# Patient Record
Sex: Female | Born: 1950 | Race: White | Hispanic: No | Marital: Married | Smoking: Never smoker
Health system: Southern US, Community
[De-identification: ages and names within clinical notes are randomized; demographics above are authoritative.]

## PROBLEM LIST (undated history)

## (undated) DIAGNOSIS — Z8553 Personal history of malignant neoplasm of renal pelvis: Secondary | ICD-10-CM

## (undated) DIAGNOSIS — Z8679 Personal history of other diseases of the circulatory system: Secondary | ICD-10-CM

## (undated) DIAGNOSIS — Z9221 Personal history of antineoplastic chemotherapy: Secondary | ICD-10-CM

## (undated) DIAGNOSIS — R112 Nausea with vomiting, unspecified: Secondary | ICD-10-CM

## (undated) DIAGNOSIS — E785 Hyperlipidemia, unspecified: Secondary | ICD-10-CM

## (undated) DIAGNOSIS — M1990 Unspecified osteoarthritis, unspecified site: Secondary | ICD-10-CM

## (undated) DIAGNOSIS — Z923 Personal history of irradiation: Secondary | ICD-10-CM

## (undated) DIAGNOSIS — M6530 Trigger finger, unspecified finger: Secondary | ICD-10-CM

## (undated) DIAGNOSIS — Z973 Presence of spectacles and contact lenses: Secondary | ICD-10-CM

## (undated) DIAGNOSIS — C50919 Malignant neoplasm of unspecified site of unspecified female breast: Secondary | ICD-10-CM

## (undated) DIAGNOSIS — D1803 Hemangioma of intra-abdominal structures: Secondary | ICD-10-CM

## (undated) DIAGNOSIS — C679 Malignant neoplasm of bladder, unspecified: Secondary | ICD-10-CM

## (undated) DIAGNOSIS — Z9889 Other specified postprocedural states: Secondary | ICD-10-CM

## (undated) DIAGNOSIS — E119 Type 2 diabetes mellitus without complications: Secondary | ICD-10-CM

## (undated) DIAGNOSIS — I491 Atrial premature depolarization: Secondary | ICD-10-CM

## (undated) DIAGNOSIS — N390 Urinary tract infection, site not specified: Secondary | ICD-10-CM

## (undated) DIAGNOSIS — I10 Essential (primary) hypertension: Secondary | ICD-10-CM

## (undated) HISTORY — DX: Malignant neoplasm of unspecified site of unspecified female breast: C50.919

## (undated) HISTORY — PX: CATARACT EXTRACTION W/ INTRAOCULAR LENS  IMPLANT, BILATERAL: SHX1307

---

## 1978-09-28 HISTORY — PX: TUBAL LIGATION: SHX77

## 1987-09-29 HISTORY — PX: OTHER SURGICAL HISTORY: SHX169

## 1988-09-28 HISTORY — PX: ABDOMINAL HYSTERECTOMY: SHX81

## 1994-09-28 HISTORY — PX: CEREBRAL ANEURYSM REPAIR: SHX164

## 2000-07-16 ENCOUNTER — Encounter: Admission: RE | Admit: 2000-07-16 | Discharge: 2000-07-16 | Payer: Self-pay | Admitting: Obstetrics and Gynecology

## 2000-07-16 ENCOUNTER — Encounter: Payer: Self-pay | Admitting: Obstetrics and Gynecology

## 2001-03-16 ENCOUNTER — Other Ambulatory Visit: Admission: RE | Admit: 2001-03-16 | Discharge: 2001-03-16 | Payer: Self-pay | Admitting: Obstetrics and Gynecology

## 2001-08-02 ENCOUNTER — Encounter: Admission: RE | Admit: 2001-08-02 | Discharge: 2001-08-02 | Payer: Self-pay | Admitting: Obstetrics and Gynecology

## 2001-08-02 ENCOUNTER — Encounter: Payer: Self-pay | Admitting: Obstetrics and Gynecology

## 2001-11-03 ENCOUNTER — Encounter: Payer: Self-pay | Admitting: Internal Medicine

## 2001-11-03 ENCOUNTER — Encounter: Admission: RE | Admit: 2001-11-03 | Discharge: 2001-11-03 | Payer: Self-pay | Admitting: Internal Medicine

## 2001-11-11 ENCOUNTER — Encounter: Payer: Self-pay | Admitting: Internal Medicine

## 2001-11-11 ENCOUNTER — Encounter: Admission: RE | Admit: 2001-11-11 | Discharge: 2001-11-11 | Payer: Self-pay | Admitting: Internal Medicine

## 2002-08-18 ENCOUNTER — Encounter: Admission: RE | Admit: 2002-08-18 | Discharge: 2002-08-18 | Payer: Self-pay | Admitting: Internal Medicine

## 2002-08-18 ENCOUNTER — Encounter: Payer: Self-pay | Admitting: Internal Medicine

## 2003-09-04 ENCOUNTER — Encounter: Admission: RE | Admit: 2003-09-04 | Discharge: 2003-09-04 | Payer: Self-pay | Admitting: Internal Medicine

## 2004-09-11 ENCOUNTER — Ambulatory Visit (HOSPITAL_COMMUNITY): Admission: RE | Admit: 2004-09-11 | Discharge: 2004-09-11 | Payer: Self-pay | Admitting: Internal Medicine

## 2005-09-17 ENCOUNTER — Ambulatory Visit (HOSPITAL_COMMUNITY): Admission: RE | Admit: 2005-09-17 | Discharge: 2005-09-17 | Payer: Self-pay | Admitting: Internal Medicine

## 2005-10-12 ENCOUNTER — Encounter: Admission: RE | Admit: 2005-10-12 | Discharge: 2005-10-12 | Payer: Self-pay | Admitting: Internal Medicine

## 2005-10-14 ENCOUNTER — Encounter: Admission: RE | Admit: 2005-10-14 | Discharge: 2005-10-14 | Payer: Self-pay | Admitting: Internal Medicine

## 2006-12-09 ENCOUNTER — Ambulatory Visit (HOSPITAL_COMMUNITY): Admission: RE | Admit: 2006-12-09 | Discharge: 2006-12-09 | Payer: Self-pay | Admitting: Internal Medicine

## 2007-12-12 ENCOUNTER — Ambulatory Visit (HOSPITAL_COMMUNITY): Admission: RE | Admit: 2007-12-12 | Discharge: 2007-12-12 | Payer: Self-pay | Admitting: Internal Medicine

## 2008-09-28 HISTORY — PX: CHOLECYSTECTOMY: SHX55

## 2008-12-14 ENCOUNTER — Ambulatory Visit (HOSPITAL_COMMUNITY): Admission: RE | Admit: 2008-12-14 | Discharge: 2008-12-14 | Payer: Self-pay | Admitting: Internal Medicine

## 2009-12-17 ENCOUNTER — Ambulatory Visit (HOSPITAL_COMMUNITY): Admission: RE | Admit: 2009-12-17 | Discharge: 2009-12-17 | Payer: Self-pay | Admitting: Internal Medicine

## 2010-10-19 ENCOUNTER — Encounter: Payer: Self-pay | Admitting: Internal Medicine

## 2010-11-21 ENCOUNTER — Other Ambulatory Visit (HOSPITAL_COMMUNITY): Payer: Self-pay | Admitting: Internal Medicine

## 2010-11-21 DIAGNOSIS — Z1231 Encounter for screening mammogram for malignant neoplasm of breast: Secondary | ICD-10-CM

## 2010-12-23 ENCOUNTER — Ambulatory Visit (HOSPITAL_COMMUNITY)
Admission: RE | Admit: 2010-12-23 | Discharge: 2010-12-23 | Disposition: A | Discharge status: Home / Self Care | Payer: BC Managed Care – PPO | Source: Ambulatory Visit | Attending: Internal Medicine | Admitting: Internal Medicine

## 2010-12-23 DIAGNOSIS — Z1231 Encounter for screening mammogram for malignant neoplasm of breast: Secondary | ICD-10-CM | POA: Insufficient documentation

## 2011-11-13 ENCOUNTER — Other Ambulatory Visit (HOSPITAL_COMMUNITY): Payer: Self-pay | Admitting: Internal Medicine

## 2011-11-13 DIAGNOSIS — Z1231 Encounter for screening mammogram for malignant neoplasm of breast: Secondary | ICD-10-CM

## 2011-12-25 ENCOUNTER — Ambulatory Visit (HOSPITAL_COMMUNITY)
Admission: RE | Admit: 2011-12-25 | Discharge: 2011-12-25 | Disposition: A | Discharge status: Home / Self Care | Payer: BC Managed Care – PPO | Source: Ambulatory Visit | Attending: Internal Medicine | Admitting: Internal Medicine

## 2011-12-25 DIAGNOSIS — Z1231 Encounter for screening mammogram for malignant neoplasm of breast: Secondary | ICD-10-CM | POA: Insufficient documentation

## 2012-11-22 LAB — LIPID PANEL
CHOLESTEROL: 137 (ref 0–200)
HDL: 53 (ref 35–70)
LDL Cholesterol: 66
Triglycerides: 92 (ref 40–160)

## 2012-11-22 LAB — HEMOGLOBIN A1C: HEMOGLOBIN A1C: 6.2

## 2012-12-06 ENCOUNTER — Other Ambulatory Visit (HOSPITAL_COMMUNITY): Payer: Self-pay | Admitting: Internal Medicine

## 2012-12-06 DIAGNOSIS — Z1231 Encounter for screening mammogram for malignant neoplasm of breast: Secondary | ICD-10-CM

## 2012-12-29 ENCOUNTER — Ambulatory Visit (HOSPITAL_COMMUNITY)
Admission: RE | Admit: 2012-12-29 | Discharge: 2012-12-29 | Disposition: A | Discharge status: Home / Self Care | Payer: BC Managed Care – PPO | Source: Ambulatory Visit | Attending: Internal Medicine | Admitting: Internal Medicine

## 2012-12-29 DIAGNOSIS — Z1231 Encounter for screening mammogram for malignant neoplasm of breast: Secondary | ICD-10-CM | POA: Insufficient documentation

## 2013-04-29 ENCOUNTER — Emergency Department (HOSPITAL_BASED_OUTPATIENT_CLINIC_OR_DEPARTMENT_OTHER)
Admission: EM | Admit: 2013-04-29 | Discharge: 2013-04-29 | Disposition: A | Discharge status: Home / Self Care | Payer: BC Managed Care – PPO | Attending: Emergency Medicine | Admitting: Emergency Medicine

## 2013-04-29 ENCOUNTER — Encounter (HOSPITAL_BASED_OUTPATIENT_CLINIC_OR_DEPARTMENT_OTHER): Payer: Self-pay | Admitting: *Deleted

## 2013-04-29 DIAGNOSIS — N390 Urinary tract infection, site not specified: Secondary | ICD-10-CM

## 2013-04-29 DIAGNOSIS — Z79899 Other long term (current) drug therapy: Secondary | ICD-10-CM | POA: Insufficient documentation

## 2013-04-29 DIAGNOSIS — E1169 Type 2 diabetes mellitus with other specified complication: Secondary | ICD-10-CM | POA: Insufficient documentation

## 2013-04-29 DIAGNOSIS — R1110 Vomiting, unspecified: Secondary | ICD-10-CM | POA: Insufficient documentation

## 2013-04-29 DIAGNOSIS — R109 Unspecified abdominal pain: Secondary | ICD-10-CM

## 2013-04-29 DIAGNOSIS — R739 Hyperglycemia, unspecified: Secondary | ICD-10-CM

## 2013-04-29 DIAGNOSIS — Z880 Allergy status to penicillin: Secondary | ICD-10-CM | POA: Insufficient documentation

## 2013-04-29 LAB — CBC WITH DIFFERENTIAL/PLATELET
Basophils Absolute: 0 10*3/uL (ref 0.0–0.1)
Hemoglobin: 13.4 g/dL (ref 12.0–15.0)
Lymphocytes Relative: 9 % — ABNORMAL LOW (ref 12–46)
MCH: 30.9 pg (ref 26.0–34.0)
MCV: 91.2 fL (ref 78.0–100.0)
Monocytes Absolute: 1.2 10*3/uL — ABNORMAL HIGH (ref 0.1–1.0)
Neutrophils Relative %: 81 % — ABNORMAL HIGH (ref 43–77)
Platelets: 226 10*3/uL (ref 150–400)

## 2013-04-29 LAB — COMPREHENSIVE METABOLIC PANEL
AST: 20 U/L (ref 0–37)
Alkaline Phosphatase: 44 U/L (ref 39–117)
Chloride: 98 mEq/L (ref 96–112)
Creatinine, Ser: 1 mg/dL (ref 0.50–1.10)
GFR calc Af Amer: 67 mL/min — ABNORMAL LOW (ref >90)
Glucose, Bld: 231 mg/dL — ABNORMAL HIGH (ref 70–99)
Sodium: 133 mEq/L — ABNORMAL LOW (ref 135–145)

## 2013-04-29 LAB — URINALYSIS, ROUTINE W REFLEX MICROSCOPIC
Bilirubin Urine: NEGATIVE
Glucose, UA: 500 mg/dL — AB
Protein, ur: NEGATIVE mg/dL
Specific Gravity, Urine: 1.023 (ref 1.005–1.030)
pH: 5.5 (ref 5.0–8.0)

## 2013-04-29 LAB — URINE MICROSCOPIC-ADD ON

## 2013-04-29 MED ORDER — CIPROFLOXACIN HCL 500 MG PO TABS: 500.0000 mg | ORAL_TABLET | Freq: Two times a day (BID) | ORAL | Status: DC

## 2013-04-29 NOTE — ED Notes (Signed)
Patient states that she went out to eat on Thursday and afterward had gas like pains in her abd, took some tums and got relief. She states that she is still having some pain except lower. Patient is belching in triage.

## 2013-04-29 NOTE — ED Provider Notes (Signed)
CSN: [id number]     Arrival date & time 04/29/13  1357 History     First MD Initiated Contact with Patient 04/29/13 1408     Chief Complaint  Patient presents with  . Abdominal Pain    Patient is a 62 y.o. female presenting with abdominal pain. The history is provided by the patient and a significant other.  Abdominal Pain This is a new problem. The current episode started 2 days ago. The problem occurs daily. The problem has been gradually improving. Associated symptoms include abdominal pain. Pertinent negatives include no chest pain and no shortness of breath. Nothing aggravates the symptoms. Nothing relieves the symptoms. Treatments tried: prilosec. The treatment provided no relief.  pt reports she at a [place] two nights ago She started to have pain and vomited immediately after dinner Since then, she has had intermittent upper abdominal pain.  No cp/sob No further vomiting.  No diarrhea.  No constipation. No melena or bloody stool.  No back pain. No dysuria She does not use NSAIDs chronically She reports pain is improving.  She is able to eat (she had steak before coming to the ED) Her son told her to go to the ER to have her "heart checked"  Past Medical History  Diagnosis Date  . Diabetes mellitus without complication    Past Surgical History  Procedure Laterality Date  . Cholecystectomy    . Cerebral aneurysm repair     No family history on file. History  Substance Use Topics  . Smoking status: Never Smoker   . Smokeless tobacco: Not on file  . Alcohol Use: No   OB History   Grav Para Term Preterm Abortions TAB SAB Ect Mult Living                 Review of Systems  Constitutional: Negative for fever.  Respiratory: Negative for chest tightness and shortness of breath.   Cardiovascular: Negative for chest pain.  Gastrointestinal: Positive for vomiting and abdominal pain. Negative for diarrhea, constipation, blood in stool and abdominal distention.   Neurological: Negative for dizziness and weakness.  All other systems reviewed and are negative.    Allergies  Dilantin; Penicillins; and Sulfa antibiotics  Home Medications   Current Outpatient Rx  Name  Route  Sig  Dispense  Refill  . fenofibrate (TRICOR) 145 MG tablet   Oral   Take 145 mg by mouth daily.         . metFORMIN (GLUCOPHAGE) 1000 MG tablet   Oral   Take 1,000 mg by mouth 2 (two) times daily with a meal.         . phenylephrine (MYDFRIN) 2.5 % ophthalmic solution      1 drop once.         . pioglitazone (ACTOS) 15 MG tablet   Oral   Take 15 mg by mouth daily.         . pravastatin (PRAVACHOL) 10 MG tablet   Oral   Take 10 mg by mouth daily.         . quinapril (ACCUPRIL) 10 MG tablet   Oral   Take 10 mg by mouth at bedtime.          BP 137/58  Pulse 100  Temp(Src) 99.2 F (37.3 C) (Oral)  Resp 18  SpO2 99% Physical Exam CONSTITUTIONAL: Well developed/well nourished HEAD: Normocephalic/atraumatic EYES: EOMI/PERRL, no icterus ENMT: Mucous membranes moist NECK: supple no meningeal signs SPINE:entire spine nontender CV: S1/S2 noted,  no murmurs/rubs/gallops noted LUNGS: Lungs are clear to auscultation bilaterally, no apparent distress ABDOMEN: soft, mild tenderness to LUQ, no rebound or guarding, +BS GU:no cva tenderness NEURO: Pt is awake/alert, moves all extremitiesx4 EXTREMITIES: pulses normal, full ROM SKIN: warm, color normal, no discoloration to abdomen PSYCH: no abnormalities of mood noted  ED Course   Procedures   Labs Reviewed  CBC WITH DIFFERENTIAL  COMPREHENSIVE METABOLIC PANEL  LIPASE, BLOOD  URINALYSIS, ROUTINE W REFLEX MICROSCOPIC   2:33 PM Pt very well appearing.  She does not want any meds at this time.  I do not feel imaging is indicated.  Will check labs/ekg Pt agreeable 3:38 PM Pt stable, no new complaints, well appearing, using her phone Will treat uti Also - mild elevation in lipase, could be from  mild pancreatitis.  Possible this is med related (tricor) but pt is in no distress, advised hydration, liquid diet and to cut back on heavy/greasy foods for the next several days Pt stable for d/c home  MDM  Nursing notes including past medical history and social history reviewed and considered in documentation Labs/vital reviewed and considered    Date: 04/29/2013 1434  Rate: 93  Rhythm: normal sinus rhythm  QRS Axis: normal  Intervals: normal  ST/T Wave abnormalities: normal  Conduction Disutrbances:none      Sharyon Cable, MD 04/29/13 1539

## 2013-05-01 LAB — URINE CULTURE: Colony Count: 9000

## 2013-12-04 ENCOUNTER — Other Ambulatory Visit (HOSPITAL_COMMUNITY): Payer: Self-pay | Admitting: Internal Medicine

## 2013-12-04 DIAGNOSIS — Z1231 Encounter for screening mammogram for malignant neoplasm of breast: Secondary | ICD-10-CM

## 2014-01-08 ENCOUNTER — Ambulatory Visit (HOSPITAL_COMMUNITY)
Admission: RE | Admit: 2014-01-08 | Discharge: 2014-01-08 | Disposition: A | Discharge status: Home / Self Care | Payer: BC Managed Care – PPO | Source: Ambulatory Visit | Attending: Internal Medicine | Admitting: Internal Medicine

## 2014-01-08 DIAGNOSIS — Z1231 Encounter for screening mammogram for malignant neoplasm of breast: Secondary | ICD-10-CM | POA: Insufficient documentation

## 2014-06-15 LAB — BASIC METABOLIC PANEL
BUN: 24 — AB (ref 4–21)
CREATININE: 1.3 — AB (ref 0.5–1.1)

## 2014-06-15 LAB — TSH: TSH: 3.12 (ref 0.41–5.90)

## 2014-12-19 ENCOUNTER — Other Ambulatory Visit (HOSPITAL_COMMUNITY): Payer: Self-pay | Admitting: Internal Medicine

## 2014-12-19 DIAGNOSIS — Z1231 Encounter for screening mammogram for malignant neoplasm of breast: Secondary | ICD-10-CM

## 2015-01-14 ENCOUNTER — Ambulatory Visit (HOSPITAL_COMMUNITY)
Admission: RE | Admit: 2015-01-14 | Discharge: 2015-01-14 | Disposition: A | Discharge status: Home / Self Care | Payer: Medicare Other | Source: Ambulatory Visit | Attending: Internal Medicine | Admitting: Internal Medicine

## 2015-01-14 DIAGNOSIS — Z1231 Encounter for screening mammogram for malignant neoplasm of breast: Secondary | ICD-10-CM | POA: Insufficient documentation

## 2015-12-09 ENCOUNTER — Other Ambulatory Visit: Payer: Self-pay

## 2015-12-09 DIAGNOSIS — Z1231 Encounter for screening mammogram for malignant neoplasm of breast: Secondary | ICD-10-CM

## 2015-12-20 LAB — BASIC METABOLIC PANEL
POTASSIUM: 4.5 (ref 3.4–5.3)
Sodium: 136 — AB (ref 137–147)

## 2015-12-20 LAB — LIPID PANEL
Cholesterol: 122 (ref 0–200)
HDL: 48 (ref 35–70)
LDL Cholesterol: 59
Triglycerides: 75 (ref 40–160)

## 2015-12-20 LAB — HEPATIC FUNCTION PANEL
ALT: 26 (ref 7–35)
AST: 28 (ref 13–35)
Alkaline Phosphatase: 41 (ref 25–125)

## 2015-12-20 LAB — CBC AND DIFFERENTIAL
HEMATOCRIT: 40 (ref 36–46)
HEMOGLOBIN: 13.6 (ref 12.0–16.0)
PLATELETS: 254 (ref 150–399)
WBC: 5.8

## 2015-12-20 LAB — HEMOGLOBIN A1C: HEMOGLOBIN A1C: 6.4

## 2016-01-15 ENCOUNTER — Ambulatory Visit
Admission: RE | Admit: 2016-01-15 | Discharge: 2016-01-15 | Disposition: A | Discharge status: Home / Self Care | Payer: Medicare Other | Source: Ambulatory Visit

## 2016-01-15 DIAGNOSIS — Z1231 Encounter for screening mammogram for malignant neoplasm of breast: Secondary | ICD-10-CM

## 2016-08-16 ENCOUNTER — Emergency Department (HOSPITAL_BASED_OUTPATIENT_CLINIC_OR_DEPARTMENT_OTHER)
Admission: EM | Admit: 2016-08-16 | Discharge: 2016-08-16 | Disposition: A | Discharge status: Home / Self Care | Payer: Medicare Other | Attending: Emergency Medicine | Admitting: Emergency Medicine

## 2016-08-16 ENCOUNTER — Encounter (HOSPITAL_BASED_OUTPATIENT_CLINIC_OR_DEPARTMENT_OTHER): Payer: Self-pay | Admitting: *Deleted

## 2016-08-16 DIAGNOSIS — N12 Tubulo-interstitial nephritis, not specified as acute or chronic: Secondary | ICD-10-CM | POA: Diagnosis not present

## 2016-08-16 DIAGNOSIS — R109 Unspecified abdominal pain: Secondary | ICD-10-CM | POA: Diagnosis present

## 2016-08-16 DIAGNOSIS — Z79899 Other long term (current) drug therapy: Secondary | ICD-10-CM | POA: Diagnosis not present

## 2016-08-16 DIAGNOSIS — E119 Type 2 diabetes mellitus without complications: Secondary | ICD-10-CM | POA: Diagnosis not present

## 2016-08-16 DIAGNOSIS — Z7984 Long term (current) use of oral hypoglycemic drugs: Secondary | ICD-10-CM | POA: Diagnosis not present

## 2016-08-16 DIAGNOSIS — I10 Essential (primary) hypertension: Secondary | ICD-10-CM | POA: Insufficient documentation

## 2016-08-16 LAB — URINE MICROSCOPIC-ADD ON

## 2016-08-16 LAB — URINALYSIS, ROUTINE W REFLEX MICROSCOPIC
Bilirubin Urine: NEGATIVE
Glucose, UA: NEGATIVE mg/dL
Ketones, ur: NEGATIVE mg/dL
NITRITE: NEGATIVE
PH: 6 (ref 5.0–8.0)
Protein, ur: 100 mg/dL — AB
SPECIFIC GRAVITY, URINE: 1.014 (ref 1.005–1.030)

## 2016-08-16 MED ORDER — SODIUM CHLORIDE 0.9 % IV BOLUS (SEPSIS)
1000.0000 mL | Freq: Once | INTRAVENOUS | Status: AC
  Administered 2016-08-16: 1000 mL via INTRAVENOUS

## 2016-08-16 MED ORDER — CEFTRIAXONE SODIUM 1 G IJ SOLR
1.0000 g | Freq: Once | INTRAMUSCULAR | Status: AC
  Administered 2016-08-16: 1 g via INTRAVENOUS
  Filled 2016-08-16: qty 10

## 2016-08-16 MED ORDER — CEPHALEXIN 500 MG PO CAPS: 500.0000 mg | ORAL_CAPSULE | Freq: Three times a day (TID) | ORAL | 0 refills | Status: DC

## 2016-08-16 MED ORDER — FENTANYL CITRATE (PF) 100 MCG/2ML IJ SOLN
50.0000 ug | Freq: Once | INTRAMUSCULAR | Status: AC
  Administered 2016-08-16: 50 ug via INTRAVENOUS
  Filled 2016-08-16: qty 2

## 2016-08-16 MED ORDER — ONDANSETRON HCL 4 MG/2ML IJ SOLN
4.0000 mg | Freq: Once | INTRAMUSCULAR | Status: AC
  Administered 2016-08-16: 4 mg via INTRAVENOUS
  Filled 2016-08-16: qty 2

## 2016-08-16 NOTE — ED Provider Notes (Signed)
[place] DE[facility] Provider Note: Georgena Spurling, MD, FACEP  CSN: [id number] MRN: [id number] ARRIVAL: 08/16/16 at [place]: [place]  Flank Pain   HISTORY OF PRESENT ILLNESS  Tasha Tucker is a 65 y.o. female who developed hematuria about 3 weeks ago. She was seen at [facility] urology and had a CT scan which was originally read as negative; It was over read by radiologist who saw a lesion of the left kidney suspicious for cancer. She was also diagnosed with urinary tract infection and was treated with Macrobid; she completed the course yesterday.   She had not been having significant pain until several hours ago. She awoke with severe pain in her left flank, worse with movement or palpation. She noticed urinary pressure and discomfort with urination earlier which has improved. When she gave a urine specimen here there was gross hematuria with small clots present. She denies fever or chills. She has had nausea.   Past Medical History:  Diagnosis Date  . Diabetes mellitus without complication [facility])     Past Surgical History:  Procedure Laterality Date  . CEREBRAL ANEURYSM REPAIR    . CHOLECYSTECTOMY      History reviewed. No pertinent family history.  Social History  Substance Use Topics  . Smoking status: Never Smoker  . Smokeless tobacco: Never Used  . Alcohol use No    Prior to Admission medications   Medication Sig Start Date End Date Taking? Authorizing Provider  ciprofloxacin (CIPRO) 500 MG tablet Take 1 tablet (500 mg total) by mouth 2 (two) times daily. One po bid x 7 days 04/29/13   Ripley Fraise, MD  fenofibrate (TRICOR) 145 MG tablet Take 145 mg by mouth daily.    Historical Provider, MD  metFORMIN (GLUCOPHAGE) 1000 MG tablet Take 1,000 mg by mouth 2 (two) times daily with a meal.    Historical Provider, MD  phenylephrine (MYDFRIN) 2.5 % ophthalmic solution 1 drop once.    Historical Provider, MD  pioglitazone (ACTOS) 15 MG tablet Take  15 mg by mouth daily.    Historical Provider, MD  pravastatin (PRAVACHOL) 10 MG tablet Take 10 mg by mouth daily.    Historical Provider, MD  quinapril (ACCUPRIL) 10 MG tablet Take 10 mg by mouth at bedtime.    Historical Provider, MD    Allergies Dilantin [phenytoin]; Penicillins; and Sulfa antibiotics   REVIEW OF SYSTEMS  Negative except as noted here or in the History of Present Illness.   PHYSICAL EXAMINATION  Initial Vital Signs Blood pressure 152/65, pulse 80, temperature 98.5 F (36.9 C), temperature source Oral, resp. rate 18, height 5\' 7"  (1.702 m), weight 230 lb (104.3 kg), SpO2 100 %.  Examination General: Well-developed, well-nourished female in no acute distress; appearance consistent with age of record HENT: normocephalic; atraumatic Eyes: pupils equal, round and reactive to light; extraocular muscles intact Neck: supple Heart: regular rate and rhythm Lungs: clear to auscultation bilaterally Abdomen: soft; nondistended; suprapubic tenderness; no masses or hepatosplenomegaly; bowel sounds present GU: Left CVA tenderness Extremities: No deformity; full range of motion; pulses normal Neurologic: Awake, alert and oriented; motor function intact in all extremities and symmetric; no facial droop Skin: Warm and dry Psychiatric: Normal mood and affect   RESULTS  Summary of this visit's results, reviewed by myself:   EKG Interpretation  Date/Time:    Ventricular Rate:    PR Interval:    QRS Duration:   QT Interval:    QTC Calculation:  R Axis:     Text Interpretation:        Laboratory Studies: Results for orders placed or performed during the hospital encounter of 08/16/16 (from the past 24 hour(s))  Urinalysis, Routine w reflex microscopic (not at [facility])     Status: Abnormal   Collection Time: 08/16/16  4:50 AM  Result Value Ref Range   Color, Urine RED (A) YELLOW   APPearance CLOUDY (A) CLEAR   Specific Gravity, Urine 1.014 1.005 - 1.030   pH 6.0 5.0 -  8.0   Glucose, UA NEGATIVE NEGATIVE mg/dL   Hgb urine dipstick LARGE (A) NEGATIVE   Bilirubin Urine NEGATIVE NEGATIVE   Ketones, ur NEGATIVE NEGATIVE mg/dL   Protein, ur 100 (A) NEGATIVE mg/dL   Nitrite NEGATIVE NEGATIVE   Leukocytes, UA MODERATE (A) NEGATIVE  Urine microscopic-add on     Status: Abnormal   Collection Time: 08/16/16  4:50 AM  Result Value Ref Range   Squamous Epithelial / LPF 6-30 (A) NONE SEEN   WBC, UA TOO NUMEROUS TO COUNT 0 - 5 WBC/hpf   RBC / HPF TOO NUMEROUS TO COUNT 0 - 5 RBC/hpf   Bacteria, UA MANY (A) NONE SEEN   Imaging Studies: No results found.  ED COURSE  Nursing notes and initial vitals signs, including pulse oximetry, reviewed.  Vitals:   08/16/16 0507 08/16/16 0508  BP: 152/65   Pulse: 80   Resp: 18   Temp: 98.5 F (36.9 C)   TempSrc: Oral   SpO2: 100%   Weight:  230 lb (104.3 kg)  Height:  5\' 7"  (1.702 m)   7:11 AM Patient's pain and nausea well controlled at this time. She was given Rocephin one gram for pyelonephritis. She has an appointment with [facility], Dr. Tresa Moore, tomorrow afternoon.   PROCEDURES    ED DIAGNOSES     ICD-9-CM ICD-10-CM   1. Pyelonephritis 590.80 N12        Shanon Rosser, MD 08/16/16 [phone]

## 2016-08-16 NOTE — ED Triage Notes (Addendum)
Here with husband from home, here for L flank pain, also nausea, pressure/dysuria, change in stream, and hematuria, reports recent visits to [facility], recent CT scan, possible lesion on kidney, recent UTI, finished abx yesterday (macrobid), "feel better now than earlier", "was worse lying down in bed trying to sleep PTA", also reports pressure and scant stream earlier, "seems to have resolved, obvious blood in urine sample at triage.

## 2016-08-16 NOTE — ED Notes (Signed)
EDP into room 

## 2016-08-16 NOTE — ED Notes (Signed)
Up to b/r with steady gait, no changes, denies pain or nausea, husband remains at [facility]. Updated.

## 2016-08-17 LAB — URINE CULTURE

## 2016-08-18 ENCOUNTER — Other Ambulatory Visit: Payer: Self-pay | Admitting: Urology

## 2016-08-18 ENCOUNTER — Encounter (HOSPITAL_BASED_OUTPATIENT_CLINIC_OR_DEPARTMENT_OTHER): Payer: Self-pay | Admitting: *Deleted

## 2016-08-18 NOTE — Progress Notes (Signed)
To [facility] at [facility]- Istat 8, Ekg on arrival. Instructed Npo solids after Mn-clear liquids only(No dairy,pulp)untill 0700, then Npo .

## 2016-08-26 ENCOUNTER — Ambulatory Visit (HOSPITAL_COMMUNITY): Payer: Medicare Other

## 2016-08-26 ENCOUNTER — Encounter (HOSPITAL_BASED_OUTPATIENT_CLINIC_OR_DEPARTMENT_OTHER)
Admission: RE | Disposition: A | Discharge status: Home / Self Care | Payer: Self-pay | Source: Ambulatory Visit | Attending: Urology

## 2016-08-26 ENCOUNTER — Other Ambulatory Visit: Payer: Self-pay

## 2016-08-26 ENCOUNTER — Ambulatory Visit (HOSPITAL_BASED_OUTPATIENT_CLINIC_OR_DEPARTMENT_OTHER): Payer: Medicare Other | Admitting: Anesthesiology

## 2016-08-26 ENCOUNTER — Ambulatory Visit (HOSPITAL_BASED_OUTPATIENT_CLINIC_OR_DEPARTMENT_OTHER)
Admission: RE | Admit: 2016-08-26 | Discharge: 2016-08-26 | Disposition: A | Discharge status: Home / Self Care | Payer: Medicare Other | Source: Ambulatory Visit | Attending: Urology | Admitting: Urology

## 2016-08-26 ENCOUNTER — Encounter (HOSPITAL_BASED_OUTPATIENT_CLINIC_OR_DEPARTMENT_OTHER): Payer: Self-pay | Admitting: *Deleted

## 2016-08-26 DIAGNOSIS — C677 Malignant neoplasm of urachus: Secondary | ICD-10-CM | POA: Diagnosis not present

## 2016-08-26 DIAGNOSIS — I10 Essential (primary) hypertension: Secondary | ICD-10-CM | POA: Insufficient documentation

## 2016-08-26 DIAGNOSIS — Z880 Allergy status to penicillin: Secondary | ICD-10-CM | POA: Insufficient documentation

## 2016-08-26 DIAGNOSIS — E669 Obesity, unspecified: Secondary | ICD-10-CM | POA: Insufficient documentation

## 2016-08-26 DIAGNOSIS — E119 Type 2 diabetes mellitus without complications: Secondary | ICD-10-CM | POA: Diagnosis not present

## 2016-08-26 DIAGNOSIS — Z79899 Other long term (current) drug therapy: Secondary | ICD-10-CM | POA: Diagnosis not present

## 2016-08-26 DIAGNOSIS — Z6834 Body mass index (BMI) 34.0-34.9, adult: Secondary | ICD-10-CM | POA: Diagnosis not present

## 2016-08-26 DIAGNOSIS — Z7984 Long term (current) use of oral hypoglycemic drugs: Secondary | ICD-10-CM | POA: Insufficient documentation

## 2016-08-26 DIAGNOSIS — R52 Pain, unspecified: Secondary | ICD-10-CM

## 2016-08-26 HISTORY — PX: CYSTOSCOPY WITH RETROGRADE PYELOGRAM, URETEROSCOPY AND STENT PLACEMENT: SHX5789

## 2016-08-26 HISTORY — DX: Essential (primary) hypertension: I10

## 2016-08-26 HISTORY — DX: Nausea with vomiting, unspecified: R11.2

## 2016-08-26 HISTORY — DX: Other specified postprocedural states: Z98.890

## 2016-08-26 LAB — POCT I-STAT, CHEM 8
BUN: 27 mg/dL — AB (ref 6–20)
CALCIUM ION: 1.35 mmol/L (ref 1.15–1.40)
CREATININE: 1.5 mg/dL — AB (ref 0.44–1.00)
Chloride: 103 mmol/L (ref 101–111)
GLUCOSE: 110 mg/dL — AB (ref 65–99)
HEMATOCRIT: 47 % — AB (ref 36.0–46.0)
Hemoglobin: 16 g/dL — ABNORMAL HIGH (ref 12.0–15.0)
Potassium: 4.6 mmol/L (ref 3.5–5.1)
Sodium: 141 mmol/L (ref 135–145)
TCO2: 25 mmol/L (ref 0–100)

## 2016-08-26 LAB — GLUCOSE, CAPILLARY: Glucose-Capillary: 131 mg/dL — ABNORMAL HIGH (ref 65–99)

## 2016-08-26 SURGERY — CYSTOURETEROSCOPY, WITH RETROGRADE PYELOGRAM AND STENT INSERTION
Anesthesia: General | Site: Renal | Laterality: Left

## 2016-08-26 MED ORDER — MIDAZOLAM HCL 5 MG/5ML IJ SOLN
INTRAMUSCULAR | Status: DC | PRN
  Administered 2016-08-26: 2 mg via INTRAVENOUS

## 2016-08-26 MED ORDER — DIPHENHYDRAMINE HCL 25 MG PO CAPS
ORAL_CAPSULE | ORAL | Status: AC
  Filled 2016-08-26: qty 1

## 2016-08-26 MED ORDER — ONDANSETRON HCL 4 MG/2ML IJ SOLN
INTRAMUSCULAR | Status: AC
  Filled 2016-08-26: qty 2

## 2016-08-26 MED ORDER — OXYCODONE-ACETAMINOPHEN 5-325 MG PO TABS: 1.0000 | ORAL_TABLET | Freq: Four times a day (QID) | ORAL | 0 refills | Status: DC | PRN

## 2016-08-26 MED ORDER — DEXAMETHASONE SODIUM PHOSPHATE 4 MG/ML IJ SOLN
INTRAMUSCULAR | Status: DC | PRN
  Administered 2016-08-26: 10 mg via INTRAVENOUS

## 2016-08-26 MED ORDER — GENTAMICIN IN SALINE 1.6-0.9 MG/ML-% IV SOLN
80.0000 mg | INTRAVENOUS | Status: DC
  Filled 2016-08-26: qty 50

## 2016-08-26 MED ORDER — PROPOFOL 10 MG/ML IV BOLUS
INTRAVENOUS | Status: DC | PRN
  Administered 2016-08-26: 200 mg via INTRAVENOUS

## 2016-08-26 MED ORDER — DIPHENHYDRAMINE HCL 50 MG/ML IJ SOLN
INTRAMUSCULAR | Status: AC
  Filled 2016-08-26: qty 1

## 2016-08-26 MED ORDER — SCOPOLAMINE 1 MG/3DAYS TD PT72
MEDICATED_PATCH | TRANSDERMAL | Status: AC
  Filled 2016-08-26: qty 1

## 2016-08-26 MED ORDER — LACTATED RINGERS IV SOLN
INTRAVENOUS | Status: DC | PRN
  Administered 2016-08-26: 14:00:00 via INTRAVENOUS

## 2016-08-26 MED ORDER — SODIUM CHLORIDE 0.9 % IR SOLN
Status: DC | PRN
  Administered 2016-08-26: 4000 mL

## 2016-08-26 MED ORDER — FENTANYL CITRATE (PF) 100 MCG/2ML IJ SOLN
INTRAMUSCULAR | Status: DC | PRN
  Administered 2016-08-26 (×2): 50 ug via INTRAVENOUS

## 2016-08-26 MED ORDER — DEXAMETHASONE SODIUM PHOSPHATE 10 MG/ML IJ SOLN
INTRAMUSCULAR | Status: AC
  Filled 2016-08-26: qty 1

## 2016-08-26 MED ORDER — LIDOCAINE 2% (20 MG/ML) 5 ML SYRINGE
INTRAMUSCULAR | Status: AC
  Filled 2016-08-26: qty 5

## 2016-08-26 MED ORDER — IOHEXOL 300 MG/ML  SOLN
INTRAMUSCULAR | Status: DC | PRN
  Administered 2016-08-26: 18 mL

## 2016-08-26 MED ORDER — FENTANYL CITRATE (PF) 100 MCG/2ML IJ SOLN
INTRAMUSCULAR | Status: AC
  Filled 2016-08-26: qty 2

## 2016-08-26 MED ORDER — GENTAMICIN SULFATE 40 MG/ML IJ SOLN
5.0000 mg/kg | Freq: Once | INTRAVENOUS | Status: AC
  Administered 2016-08-26: 400 mg via INTRAVENOUS
  Filled 2016-08-26: qty 10

## 2016-08-26 MED ORDER — SCOPOLAMINE 1 MG/3DAYS TD PT72
1.0000 | MEDICATED_PATCH | TRANSDERMAL | Status: DC
  Administered 2016-08-26: 1.5 mg via TRANSDERMAL
  Filled 2016-08-26: qty 1

## 2016-08-26 MED ORDER — SODIUM CHLORIDE 0.9 % IV SOLN
INTRAVENOUS | Status: DC
Start: 2016-08-26 — End: 2016-08-26
  Administered 2016-08-26: 13:00:00 via INTRAVENOUS
  Filled 2016-08-26: qty 1000

## 2016-08-26 MED ORDER — DIPHENHYDRAMINE HCL 50 MG/ML IJ SOLN
12.5000 mg | Freq: Once | INTRAMUSCULAR | Status: AC
  Administered 2016-08-26: 12.5 mg via INTRAVENOUS
  Filled 2016-08-26: qty 0.25

## 2016-08-26 MED ORDER — LACTATED RINGERS IV SOLN
INTRAVENOUS | Status: DC
  Filled 2016-08-26: qty 1000

## 2016-08-26 MED ORDER — ONDANSETRON HCL 4 MG/2ML IJ SOLN
INTRAMUSCULAR | Status: DC | PRN
  Administered 2016-08-26: 4 mg via INTRAVENOUS

## 2016-08-26 MED ORDER — SENNOSIDES-DOCUSATE SODIUM 8.6-50 MG PO TABS: 1.0000 | ORAL_TABLET | Freq: Two times a day (BID) | ORAL | 0 refills | Status: DC

## 2016-08-26 MED ORDER — LIDOCAINE 2% (20 MG/ML) 5 ML SYRINGE
INTRAMUSCULAR | Status: DC | PRN
  Administered 2016-08-26: 60 mg via INTRAVENOUS

## 2016-08-26 MED ORDER — PROPOFOL 10 MG/ML IV BOLUS
INTRAVENOUS | Status: AC
  Filled 2016-08-26: qty 40

## 2016-08-26 MED ORDER — MIDAZOLAM HCL 2 MG/2ML IJ SOLN
INTRAMUSCULAR | Status: AC
  Filled 2016-08-26: qty 2

## 2016-08-26 SURGICAL SUPPLY — 31 items
BAG DRAIN URO-CYSTO SKYTR STRL (DRAIN) ×3 IMPLANT
BAG DRN UROCATH (DRAIN) ×1
BASKET DAKOTA 1.9FR 11X120 (BASKET) IMPLANT
BASKET LASER NITINOL 1.9FR (BASKET) IMPLANT
BASKET ZERO TIP NITINOL 2.4FR (BASKET) IMPLANT
BSKT STON RTRVL 120 1.9FR (BASKET)
BSKT STON RTRVL ZERO TP 2.4FR (BASKET)
CATH INTERMIT  6FR 70CM (CATHETERS) ×3 IMPLANT
CLOTH BEACON ORANGE TIMEOUT ST (SAFETY) ×3 IMPLANT
FIBER LASER FLEXIVA 365 (UROLOGICAL SUPPLIES) IMPLANT
FIBER LASER TRAC TIP (UROLOGICAL SUPPLIES) IMPLANT
GLOVE BIO SURGEON STRL SZ7.5 (GLOVE) ×3 IMPLANT
GLOVE EUDERMIC 7 POWDERFREE (GLOVE) ×2 IMPLANT
GLOVE INDICATOR 7.0 STRL GRN (GLOVE) ×2 IMPLANT
GOWN STRL REUS W/ TWL LRG LVL3 (GOWN DISPOSABLE) ×2 IMPLANT
GOWN STRL REUS W/TWL LRG LVL3 (GOWN DISPOSABLE) ×6
GOWN STRL REUS W/TWL XL LVL3 (GOWN DISPOSABLE) ×2 IMPLANT
GUIDEWIRE ANG ZIPWIRE 038X150 (WIRE) ×3 IMPLANT
GUIDEWIRE STR DUAL SENSOR (WIRE) ×3 IMPLANT
IV NS 1000ML (IV SOLUTION) ×3
IV NS 1000ML BAXH (IV SOLUTION) ×1 IMPLANT
IV NS IRRIG 3000ML ARTHROMATIC (IV SOLUTION) ×3 IMPLANT
KIT ROOM TURNOVER WOR (KITS) ×3 IMPLANT
MANIFOLD NEPTUNE II (INSTRUMENTS) ×3 IMPLANT
NS IRRIG 500ML POUR BTL (IV SOLUTION) ×3 IMPLANT
PACK CYSTO (CUSTOM PROCEDURE TRAY) ×3 IMPLANT
STENT URET 6FRX24 CONTOUR (STENTS) ×2 IMPLANT
SYRINGE 10CC LL (SYRINGE) ×3 IMPLANT
TUBE CONNECTING 12'X1/4 (SUCTIONS) ×1
TUBE CONNECTING 12X1/4 (SUCTIONS) ×1 IMPLANT
TUBE FEEDING 8FR 16IN STR KANG (MISCELLANEOUS) ×3 IMPLANT

## 2016-08-26 NOTE — Discharge Instructions (Signed)
°  Post Anesthesia Home Care Instructions ° °Activity: °Get plenty of rest for the remainder of the day. A responsible adult should stay with you for 24 hours following the procedure.  °For the next 24 hours, DO NOT: °-Drive a car °-Operate machinery °-Drink alcoholic beverages °-Take any medication unless instructed by your physician °-Make any legal decisions or sign important papers. ° °Meals: °Start with liquid foods such as gelatin or soup. Progress to regular foods as tolerated. Avoid greasy, spicy, heavy foods. If nausea and/or vomiting occur, drink only clear liquids until the nausea and/or vomiting subsides. Call your physician if vomiting continues. ° °Special Instructions/Symptoms: °Your throat may feel dry or sore from the anesthesia or the breathing tube placed in your throat during surgery. If this causes discomfort, gargle with warm salt water. The discomfort should disappear within 24 hours. ° °If you had a scopolamine patch placed behind your ear for the management of post- operative nausea and/or vomiting: ° °1. The medication in the patch is effective for 72 hours, after which it should be removed.  Wrap patch in a tissue and discard in the trash. Wash hands thoroughly with soap and water. °2. You may remove the patch earlier than 72 hours if you experience unpleasant side effects which may include dry mouth, dizziness or visual disturbances. °3. Avoid touching the patch. Wash your hands with soap and water after contact with the patch. °  °1 - You may have urinary urgency (bladder spasms) and bloody urine on / off with stent in place. This is normal. ° °2 - Call MD or go to ER for fever >102, severe pain / nausea / vomiting not relieved by medications, or acute change in medical status ° °

## 2016-08-26 NOTE — Anesthesia Preprocedure Evaluation (Addendum)
Anesthesia Evaluation  Patient identified by MRN, date of birth, ID band Patient awake    Reviewed: Allergy & Precautions, NPO status , Patient's Chart, lab work & pertinent test results  History of Anesthesia Complications (+) PONV and history of anesthetic complications  Airway Mallampati: II  TM Distance: >3 FB Neck ROM: Full    Dental  (+) Teeth Intact, Dental Advisory Given, Chipped,    Pulmonary neg pulmonary ROS,    breath sounds clear to auscultation       Cardiovascular Exercise Tolerance: Good hypertension, Pt. on medications  Rhythm:Regular Rate:Normal     Neuro/Psych negative neurological ROS  negative psych ROS   GI/Hepatic negative GI ROS, Neg liver ROS,   Endo/Other  diabetes, Type 2, Oral Hypoglycemic Agents  Renal/GU   negative genitourinary   Musculoskeletal negative musculoskeletal ROS (+)   Abdominal   Peds negative pediatric ROS (+)  Hematology negative hematology ROS (+)   Anesthesia Other Findings   Reproductive/Obstetrics negative OB ROS                            Anesthesia Physical Anesthesia Plan  ASA: II  Anesthesia Plan: General   Post-op Pain Management:    Induction: Intravenous  Airway Management Planned: LMA  Additional Equipment:   Intra-op Plan:   Post-operative Plan: Extubation in OR  Informed Consent: I have reviewed the patients History and Physical, chart, labs and discussed the procedure including the risks, benefits and alternatives for the proposed anesthesia with the patient or authorized representative who has indicated his/her understanding and acceptance.   Dental advisory given  Plan Discussed with: CRNA  Anesthesia Plan Comments: (Need EKG)        Anesthesia Quick Evaluation

## 2016-08-26 NOTE — Transfer of Care (Signed)
Immediate Anesthesia Transfer of Care Note  Patient: Tasha Tucker  Procedure(s) Performed: Procedure(s): CYSTOSCOPY WITH BILATERAL RETROGRADE PYELOGRAM,LEFT URETEROSCOPY WITH BIOPSY AND LEFT STENT PLACEMENT (Left)  Patient Location: PACU  Anesthesia Type:General  Level of Consciousness: awake, alert , oriented and patient cooperative  Airway & Oxygen Therapy: Patient Spontanous Breathing and Patient connected to nasal cannula oxygen  Post-op Assessment: Report given to RN and Post -op Vital signs reviewed and stable  Post vital signs: Reviewed and stable  Last Vitals:  Vitals:   08/26/16 1222  BP: (!) 151/62  Pulse: (!) 106  Resp: 16  Temp: 37.6 C    Last Pain:  Vitals:   08/26/16 1222  TempSrc: Oral      Patients Stated Pain Goal: 7 (39/35/94 0905)  Complications: No apparent anesthesia complications

## 2016-08-26 NOTE — H&P (Signed)
Tasha Tucker is an 65 y.o. female.    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 2 arter (lower accessory) 1 vein (small lumbar noted) left renovascular anatomy.   2 - Gross Hematuria - new gross hematuria 2017. CT with left renal pelvis mass as per above.   3 - Cystitis / Bacteruria - MDR enterococcus on UCX 2017, sens macrobid.   PMH sig for obesity, lap hyst (benign), BTL, lap chole, DM2 (no neuropathy). Her PCP is Tasha Colder MD.   Today "Tasha Tucker" is seen to proceed with ureteroscopy for diagnostic and staging purposes. NO interval fevers. She has been on macrobid pre-op as per most recent CX data.    Past Medical History:  Diagnosis Date  . Diabetes mellitus without complication (HCC)    Type 2  . Hematuria   . Hypertension   . PONV (postoperative nausea and vomiting)     Past Surgical History:  Procedure Laterality Date  . ABDOMINAL HYSTERECTOMY  1990  . CEREBRAL ANEURYSM REPAIR  1996   clipped-no deficits  . CHOLECYSTECTOMY  2010  . EYE SURGERY Bilateral 2017 ,2016   cataract w/lens  . TUBAL LIGATION  1980    History reviewed. No pertinent family history. Social History:  reports that she has never smoked. She has never used smokeless tobacco. She reports that she does not drink alcohol. Her drug history is not on file.  Allergies:  Allergies  Allergen Reactions  . Dilantin [Phenytoin] Rash  . Penicillins Rash  . Sulfa Antibiotics Rash    No prescriptions prior to admission.    No results found for this or any previous visit (from the past 48 hour(s)). No results found.  Review of Systems  Constitutional: Negative.  Negative for chills and fever.  HENT: Negative.   Eyes: Negative.   Respiratory: Negative.   Cardiovascular: Negative.    Gastrointestinal: Negative.   Genitourinary: Negative.   Musculoskeletal: Negative.   Skin: Negative.   Neurological: Negative.   Endo/Heme/Allergies: Negative.   Psychiatric/Behavioral: Negative.     Height 5\' 8"  (1.727 m), weight 104.3 kg (230 lb). Physical Exam  Constitutional: She appears well-developed.  HENT:  Head: Normocephalic.  Eyes: Pupils are equal, round, and reactive to light.  Neck: Normal range of motion.  Cardiovascular: Normal rate.   Respiratory: Effort normal.  GI: Soft.  Genitourinary:  Genitourinary Comments: No CVAT at present  Musculoskeletal: Normal range of motion.  Neurological: She is alert.  Skin: Skin is warm.  Psychiatric: She has a normal mood and affect.     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 Rec proceed with operativ cysto, bilat retrogrades left ureteroscopy / biopsy / stent today. If in fact cancer, she will likely need eventual nephroureterectomy   Risks, benefits, alternaitives, expected peri-op course discussed previously and reiterated today.   Alexis Frock, MD 08/26/2016, 12:05 PM

## 2016-08-26 NOTE — Anesthesia Procedure Notes (Signed)
Procedure Name: LMA Insertion Date/Time: 08/26/2016 2:27 PM Performed by: Wanita Chamberlain Pre-anesthesia Checklist: Patient identified, Timeout performed, Suction available, Emergency Drugs available and Patient being monitored Patient Re-evaluated:Patient Re-evaluated prior to inductionOxygen Delivery Method: Circle system utilized Preoxygenation: Pre-oxygenation with 100% oxygen Intubation Type: IV induction Ventilation: Mask ventilation without difficulty LMA: LMA inserted LMA Size: 4.0 Number of attempts: 1 Placement Confirmation: positive ETCO2 and breath sounds checked- equal and bilateral Tube secured with: Tape Dental Injury: Teeth and Oropharynx as per pre-operative assessment

## 2016-08-26 NOTE — Brief Op Note (Signed)
08/26/2016  2:59 PM  PATIENT:  Tasha Tucker  65 y.o. female  PRE-OPERATIVE DIAGNOSIS:  LEFT RENAL PELVIS MASS, HEMATURIA  POST-OPERATIVE DIAGNOSIS:  LEFT RENAL PELVIS MASS, HEMATURIA  PROCEDURE:  Procedure(s): CYSTOSCOPY WITH BILATERAL RETROGRADE PYELOGRAM,LEFT URETEROSCOPY WITH BIOPSY AND LEFT STENT PLACEMENT (Left)  SURGEON:  Surgeon(s) and Role:    * Alexis Frock, MD - Primary  PHYSICIAN ASSISTANT:   ASSISTANTS: none   ANESTHESIA:   general  EBL:  Total I/O In: 500 [I.V.:500] Out: -   BLOOD ADMINISTERED:none  DRAINS: none   LOCAL MEDICATIONS USED:  NONE  SPECIMEN:  Source of Specimen:  Left renal pelvis tumor fragments  DISPOSITION OF SPECIMEN:  PATHOLOGY  COUNTS:  YES  TOURNIQUET:  * No tourniquets in log *  DICTATION: .Other Dictation: Dictation Number [id number]  PLAN OF CARE: Discharge to home after PACU  PATIENT DISPOSITION:  PACU - hemodynamically stable.   Delay start of Pharmacological VTE agent (>24hrs) due to surgical blood loss or risk of bleeding: yes

## 2016-08-26 NOTE — Anesthesia Postprocedure Evaluation (Signed)
Anesthesia Post Note  Patient: Tasha Tucker  Procedure(s) Performed: Procedure(s) (LRB): CYSTOSCOPY WITH BILATERAL RETROGRADE PYELOGRAM,LEFT URETEROSCOPY WITH BIOPSY AND LEFT STENT PLACEMENT (Left)  Patient location during evaluation: PACU Anesthesia Type: General Level of consciousness: awake and alert and patient cooperative Pain management: pain level controlled Vital Signs Assessment: post-procedure vital signs reviewed and stable Respiratory status: spontaneous breathing and respiratory function stable Cardiovascular status: stable Anesthetic complications: no    Last Vitals:  Vitals:   08/26/16 1545 08/26/16 1613  BP: (!) 154/63 (!) 144/58  Pulse: 84 74  Resp: 13 14  Temp:  36.7 C    Last Pain:  Vitals:   08/26/16 1613  TempSrc: Tympanic  PainSc: 0-No pain                 Harmonee Tozer S

## 2016-08-27 ENCOUNTER — Encounter (HOSPITAL_BASED_OUTPATIENT_CLINIC_OR_DEPARTMENT_OTHER): Payer: Self-pay | Admitting: Urology

## 2016-08-28 NOTE — Op Note (Signed)
NAME:  Tasha Tucker, Tasha Tucker               ACCOUNT NO.:  [id number]  MEDICAL RECORD NO.:  [id number]  LOCATION:                                 FACILITY:  PHYSICIAN:  Alexis Frock, MD     DATE OF BIRTH:  July 23, 1951  DATE OF PROCEDURE: 08/26/2016                              OPERATIVE REPORT   DIAGNOSIS:  Left renal pelvis mass, gross hematuria.  PROCEDURES: 1. Cystoscopy with bilateral retrograde pyelograms with     interpretation. 2. Left ureteroscopy with biopsy of tumor. 3. Insertion of left ureteral stent, 6 x 24, Contour, no tether.  ESTIMATED BLOOD LOSS:  Nil.  COMPLICATION:  None.  SPECIMEN:  Left renal pelvis tumor fragments for permanent pathology.  FINDINGS: 1. Unremarkable urinary bladder. 2. Unremarkable right retrograde pyelogram. 3. Large filling defect in left renal pelvis obliterating the upper     pole of kidney.  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 4. Successful placement of left ureteral stent, proximal in renal     pelvis and distal in urinary bladder.  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  Options were discussed for management, initial management including recommendation of left ureteroscopic biopsy for diagnostic and staging purposes and she wished to proceed.  Informed consent was obtained and placed in the medical record.  PROCEDURE IN DETAIL:  The patient being Tasha Tucker, was verified. Procedure being cysto, bilateral retrogrades and left ureteroscopy with biopsy was confirmed.  Procedure was carried out.  Time-out was performed.  Intravenous antibiotics were administered.  General anesthesia was introduced.  The patient was placed into a low lithotomy position and sterile field was created by  prepping and draping the patient's vagina, introitus, and proximal thighs using iodine.  Next, cystourethroscopy was performed using a rigid cystoscope with offset lens.  Inspection of the urinary bladder revealed no diverticula, calcifications, or papillary lesions.  The right ureteral orifice was cannulated with a 6-French end-hole catheter and right retrograde pyelogram was obtained.  Right retrograde pyelogram demonstrated a single right ureter with single-system right kidney.  No filling defects or narrowing noted. Similarly, left retrograde pyelogram was obtained.  Left retrograde pyelogram demonstrated a single left ureter with single- system left kidney.  There was a large filling defect in the mid and upper pole of left kidney completely obliterating the intrarenal collecting system above the lower third of the kidney on the left.  A 0.038 Zip wire was advanced to the level of the renal pelvis, set aside as a safety wire.  An 8-French feeding tube was placed in the urinary bladder for pressure release.  Next, semi-rigid ureteroscopy was performed from the entire length of left ureter alongside a separate Sensor working wire.  No mucosal abnormalities were found.  The semi- rigid scope was exchanged for a 12/14, 24-cm ureteral access sheath at the level of proximal ureter using fluoroscopic guidance.  Next, flexible digital ureteroscopy was performed of the proximal left ureter and  systematic inspection of the right kidney, including all lower pole calices and renal pelvis.  This revealed a very large papillary tumor occupying the vast majority of the left renal pelvis and mid and upper pole calices.  There were multiple dystrophic calcifications.  There was a very large interface between this and the lateral urothelium.  It appeared to be much too large for endoscopic management.  An Escape basket was then used to snare representative areas of the neoplasm, which were then  removed ureteroscopically and set aside for pathologic evaluation.  The access sheath was removed under continuous vision.  No mucosal abnormalities were found, and a new 6 x 24 Contour-type stent was placed over the remaining safety wire using fluoroscopic guidance. Good proximal and distal deployment were noted.  Procedure was then terminated.  The patient tolerated the procedure well.  There were no immediate periprocedural complications.  The patient was taken to the postanesthesia care unit in stable condition.    ______________________________ Alexis Frock, MD   ______________________________ Alexis Frock, MD    TM/MEDQ  D:  08/26/2016  T:  08/27/2016  Job:  [id number]

## 2016-08-31 ENCOUNTER — Other Ambulatory Visit: Payer: Self-pay | Admitting: Urology

## 2016-10-02 NOTE — Progress Notes (Signed)
EKG 08-26-16 EPIC

## 2016-10-02 NOTE — Patient Instructions (Addendum)
Tasha Tucker  10/02/2016   Your procedure is scheduled on: 10-09-16  Report to [facility] Main  Entrance take [facility]  elevators to 3rd floor to  [place] at 515 AM.  Call this number if you have problems the morning of surgery [phone]   Remember: ONLY 1 PERSON MAY GO WITH YOU TO SHORT STAY TO GET  READY MORNING OF [place].  Do not eat food  :After Midnight Wednesday NIGHT, CLEAR LIQUIDS ALL DAY THURSDAY 10-08-16 PER DR MANNY. NO CLEAR LQIUIDS AFTER MIDNIGHT Thursday NIGHT. FOLLOW ALL BOWEL PREP INSTRUCTIONS FROM DR [facility].      Take these medicines the morning of surgery with A SIP OF WATER: ZYRTEC DO NOT TAKE ANY DIABETIC MEDICATIONS DAY OF YOUR SURGERY                               You may not have any metal on your body including hair pins and              piercings  Do not wear jewelry, make-up, lotions, powders or perfumes, deodorant             Do not wear nail polish.  Do not shave  48 hours prior to surgery.              Men may shave face and neck.   Do not bring valuables to the hospital. [place].  Contacts, dentures or bridgework may not be worn into surgery.  Leave suitcase in the car. After surgery it may be brought to your room.                 Please read over the following fact sheets you were given: _____________________________________________________________________                CLEAR LIQUID DIET   Foods Allowed                                                                     Foods Excluded  Coffee and tea, regular and decaf                             liquids that you cannot  Plain Jell-O in any flavor                                             see through such as: Fruit ices (not with fruit pulp)                                     milk, soups, orange juice  Iced Popsicles  All solid food Carbonated beverages, regular and  diet                                    Cranberry, grape and apple juices Sports drinks like Gatorade Lightly seasoned clear broth or consume(fat free) Sugar, honey syrup  Sample Menu Breakfast                                Lunch                                     Supper Cranberry juice                    Beef broth                            Chicken broth Jell-O                                     Grape juice                           Apple juice Coffee or tea                        Jell-O                                      Popsicle                                                Coffee or tea                        Coffee or tea  _____________________________________________________________________ How to Manage Your Diabetes Before and After Surgery  Why is it important to control my blood sugar before and after surgery? . Improving blood sugar levels before and after surgery helps healing and can limit problems. . A way of improving blood sugar control is eating a healthy diet by: o  Eating less sugar and carbohydrates o  Increasing activity/exercise o  Talking with your doctor about reaching your blood sugar goals . High blood sugars (greater than 180 mg/dL) can raise your risk of infections and slow your recovery, so you will need to focus on controlling your diabetes during the weeks before surgery. . Make sure that the doctor who takes care of your diabetes knows about your planned surgery including the date and location.  How do I manage my blood sugar before surgery? . Check your blood sugar at least 4 times a day, starting 2 days before surgery, to make sure that the level is not too high or low. o Check your blood sugar the morning of your surgery when you wake up and every 2 hours until you get to the Short Stay unit. . If your blood sugar is less than  70 mg/dL, you will need to treat for low blood sugar: o Do not take insulin. o Treat a low blood sugar (less than 70  mg/dL) with  cup of clear juice (cranberry or apple), 4 glucose tablets, OR glucose gel. o Recheck blood sugar in 15 minutes after treatment (to make sure it is greater than 70 mg/dL). If your blood sugar is not greater than 70 mg/dL on recheck, call [phone] for further instructions. . Report your blood sugar to the short stay nurse when you get to Short Stay.  . If you are admitted to the hospital after surgery: o Your blood sugar will be checked by the staff and you will probably be given insulin after surgery (instead of oral diabetes medicines) to make sure you have good blood sugar levels. o The goal for blood sugar control after surgery is 80-180 mg/dL.   WHAT DO I DO ABOUT MY DIABETES MEDICATION? YOU MAY TAKE YOUR METFORMIAN AS USUAL THE DAY BEFORE SURGERY ON 10-08-16.  . Do not take oral diabetes medicines (pills) the morning of surgery.  Patient Signature:  Date:   Nurse Signature:  Date:   Reviewed and Endorsed by [facility] Patient Education Committee, August 2015 [facility] - Preparing for Surgery Before surgery, you can play an important role.  Because skin is not sterile, your skin needs to be as free of germs as possible.  You can reduce the number of germs on your skin by washing with BAR OF GOAL DIAL SOAP soap before surgery.  CHG is an antiseptic cleaner which kills germs and bonds with the skin to continue killing germs even after washing. Please DO NOT use if you have an allergy to CHG or antibacterial soaps.  If your skin becomes reddened/irritated stop using the CHG and inform your nurse when you arrive at Short Stay. Do not shave (including legs and underarms) for at least 48 hours prior to the first CHG shower.  You may shave your face/neck. Please follow these instructions carefully:  1.  Shower with  BAR OF GOLD DIAL SOAP the night before surgery and the  morning of Surgery.  2.  If you choose to wash your hair, wash your hair first as usual with your  normal   shampoo.  3.  After you shampoo, rinse your hair and body thoroughly to remove the  shampoo.                                              Gently with a scrungie or clean washcloth.                 9.  Pat yourself dry with a clean towel.            10.  Wear clean pajamas.            11.  Place clean sheets on your bed the night of your first shower and do not  sleep with pets. Day of Surgery : Do not apply any lotions/deodorants the morning of surgery.  Please wear clean clothes to the hospital/surgery center.  FAILURE TO FOLLOW THESE INSTRUCTIONS MAY RESULT IN THE CANCELLATION OF YOUR SURGERY PATIENT SIGNATURE_________________________________  NURSE SIGNATURE__________________________________  ________________________________________________________________________  WHAT IS A BLOOD TRANSFUSION? Blood Transfusion Information  A transfusion is the replacement of blood or some of its parts. Blood is made  up of multiple cells which provide different functions.  Red blood cells carry oxygen and are used for blood loss replacement.  White blood cells fight against infection.  Platelets control bleeding.  Plasma helps clot blood.  Other blood products are available for specialized needs, such as hemophilia or other clotting disorders. BEFORE THE TRANSFUSION  Who gives blood for transfusions?   Healthy volunteers who are fully evaluated to make sure their blood is safe. This is blood bank blood. Transfusion therapy is the safest it has ever been in the practice of medicine. Before blood is taken from a donor, a complete history is taken to make sure that person has no history of diseases nor engages in risky social behavior (examples are intravenous drug use or sexual activity with multiple partners). The donor's travel history is screened to minimize risk of transmitting infections, such as malaria. The donated blood is tested for signs of infectious diseases, such as HIV and hepatitis.  The blood is then tested to be sure it is compatible with you in order to minimize the chance of a transfusion reaction. If you or a relative donates blood, this is often done in anticipation of surgery and is not appropriate for emergency situations. It takes many days to process the donated blood. RISKS AND COMPLICATIONS Although transfusion therapy is very safe and saves many lives, the main dangers of transfusion include:   Getting an infectious disease.  Developing a transfusion reaction. This is an allergic reaction to something in the blood you were given. Every precaution is taken to prevent this. The decision to have a blood transfusion has been considered carefully by your caregiver before blood is given. Blood is not given unless the benefits outweigh the risks. AFTER THE TRANSFUSION  Right after receiving a blood transfusion, you will usually feel much better and more energetic. This is especially true if your red blood cells have gotten low (anemic). The transfusion raises the level of the red blood cells which carry oxygen, and this usually causes an energy increase.  The nurse administering the transfusion will monitor you carefully for complications. HOME CARE INSTRUCTIONS  No special instructions are needed after a transfusion. You may find your energy is better. Speak with your caregiver about any limitations on activity for underlying diseases you may have. SEEK MEDICAL CARE IF:   Your condition is not improving after your transfusion.  You develop redness or irritation at the intravenous (IV) site. SEEK IMMEDIATE MEDICAL CARE IF:  Any of the following symptoms occur over the next 12 hours:  Shaking chills.  You have a temperature by mouth above 102 F (38.9 C), not controlled by medicine.  Chest, back, or muscle pain.  People around you feel you are not acting correctly or are confused.  Shortness of breath or difficulty breathing.  Dizziness and fainting.  You  get a rash or develop hives.  You have a decrease in urine output.  Your urine turns a dark color or changes to pink, red, or brown. Any of the following symptoms occur over the next 10 days:  You have a temperature by mouth above 102 F (38.9 C), not controlled by medicine.  Shortness of breath.  Weakness after normal activity.  The white part of the eye turns yellow (jaundice).  You have a decrease in the amount of urine or are urinating less often.  Your urine turns a dark color or changes to pink, red, or brown. Document Released: 09/11/2000 Document Revised: 12/07/2011 Document  Reviewed: 04/30/2008 ExitCare Patient Information 2014 [place], [place].  _______________________________________________________________________

## 2016-10-06 ENCOUNTER — Encounter (HOSPITAL_COMMUNITY)
Admission: RE | Admit: 2016-10-06 | Discharge: 2016-10-06 | Disposition: A | Discharge status: Home / Self Care | Payer: Medicare Other | Source: Ambulatory Visit | Attending: Urology | Admitting: Urology

## 2016-10-06 ENCOUNTER — Encounter (HOSPITAL_COMMUNITY): Payer: Self-pay

## 2016-10-06 HISTORY — DX: Unspecified osteoarthritis, unspecified site: M19.90

## 2016-10-06 LAB — COMPREHENSIVE METABOLIC PANEL
ALK PHOS: 36 U/L — AB (ref 38–126)
ALT: 25 U/L (ref 14–54)
AST: 25 U/L (ref 15–41)
Albumin: 3.7 g/dL (ref 3.5–5.0)
Anion gap: 7 (ref 5–15)
BILIRUBIN TOTAL: 0.5 mg/dL (ref 0.3–1.2)
BUN: 31 mg/dL — AB (ref 6–20)
CALCIUM: 9.7 mg/dL (ref 8.9–10.3)
CO2: 24 mmol/L (ref 22–32)
CREATININE: 1.42 mg/dL — AB (ref 0.44–1.00)
Chloride: 109 mmol/L (ref 101–111)
GFR calc Af Amer: 43 mL/min — ABNORMAL LOW (ref >60)
GFR, EST NON AFRICAN AMERICAN: 37 mL/min — AB (ref >60)
Glucose, Bld: 190 mg/dL — ABNORMAL HIGH (ref 65–99)
POTASSIUM: 4.6 mmol/L (ref 3.5–5.1)
Sodium: 140 mmol/L (ref 135–145)
TOTAL PROTEIN: 6.9 g/dL (ref 6.5–8.1)

## 2016-10-06 LAB — CBC
HEMATOCRIT: 39.3 % (ref 36.0–46.0)
Hemoglobin: 13.3 g/dL (ref 12.0–15.0)
MCH: 30.2 pg (ref 26.0–34.0)
MCHC: 33.8 g/dL (ref 30.0–36.0)
MCV: 89.3 fL (ref 78.0–100.0)
PLATELETS: 250 10*3/uL (ref 150–400)
RBC: 4.4 MIL/uL (ref 3.87–5.11)
RDW: 13.6 % (ref 11.5–15.5)
WBC: 8.2 10*3/uL (ref 4.0–10.5)

## 2016-10-06 LAB — ABO/RH: ABO/RH(D): A POS

## 2016-10-06 LAB — GLUCOSE, CAPILLARY: Glucose-Capillary: 209 mg/dL — ABNORMAL HIGH (ref 65–99)

## 2016-10-06 NOTE — Progress Notes (Signed)
CMET routed to Dr Alexis Frock  Via EPIC

## 2016-10-07 LAB — HEMOGLOBIN A1C
Hgb A1c MFr Bld: 6.4 % — ABNORMAL HIGH (ref 4.8–5.6)
Mean Plasma Glucose: 137 mg/dL

## 2016-10-08 ENCOUNTER — Encounter (HOSPITAL_COMMUNITY): Payer: Self-pay | Admitting: Anesthesiology

## 2016-10-08 MED ORDER — GENTAMICIN SULFATE 40 MG/ML IJ SOLN
5.0000 mg/kg | INTRAVENOUS | Status: AC
  Administered 2016-10-09: 400 mg via INTRAVENOUS
  Filled 2016-10-08: qty 10

## 2016-10-08 NOTE — Anesthesia Preprocedure Evaluation (Addendum)
Anesthesia Evaluation  Patient identified by MRN, date of birth, ID band Patient awake    Reviewed: Allergy & Precautions, NPO status , Patient's Chart, lab work & pertinent test results  History of Anesthesia Complications (+) PONV and history of anesthetic complications  Airway Mallampati: II  TM Distance: >3 FB Neck ROM: Limited    Dental  (+) Teeth Intact, Dental Advisory Given   Pulmonary pneumonia,    breath sounds clear to auscultation       Cardiovascular hypertension, Pt. on medications  Rhythm:Regular Rate:Normal     Neuro/Psych negative neurological ROS  negative psych ROS   GI/Hepatic negative GI ROS, Neg liver ROS,   Endo/Other  diabetes, Type 2, Oral Hypoglycemic Agents  Renal/GU   negative genitourinary   Musculoskeletal  (+) Arthritis , Osteoarthritis,    Abdominal   Peds negative pediatric ROS (+)  Hematology negative hematology ROS (+)   Anesthesia Other Findings   Reproductive/Obstetrics negative OB ROS                            Lab Results  Component Value Date   WBC 8.2 10/06/2016   HGB 13.3 10/06/2016   HCT 39.3 10/06/2016   MCV 89.3 10/06/2016   PLT 250 10/06/2016   Lab Results  Component Value Date   CREATININE 1.42 (H) 10/06/2016   BUN 31 (H) 10/06/2016   NA 140 10/06/2016   K 4.6 10/06/2016   CL 109 10/06/2016   CO2 24 10/06/2016   No results found for: INR, PROTIME  07/2016 EKG: normal sinus rhythm, occasional PAC's.   Anesthesia Physical Anesthesia Plan  ASA: II  Anesthesia Plan: General   Post-op Pain Management:    Induction: Intravenous  Airway Management Planned: Oral ETT  Additional Equipment:   Intra-op Plan:   Post-operative Plan: Extubation in OR  Informed Consent: I have reviewed the patients History and Physical, chart, labs and discussed the procedure including the risks, benefits and alternatives for the proposed  anesthesia with the patient or authorized representative who has indicated his/her understanding and acceptance.   Dental advisory given  Plan Discussed with: CRNA  Anesthesia Plan Comments:         Anesthesia Quick Evaluation

## 2016-10-09 ENCOUNTER — Inpatient Hospital Stay (HOSPITAL_COMMUNITY)
Admission: RE | Admit: 2016-10-09 | Discharge: 2016-10-11 | DRG: 658 | Disposition: A | Discharge status: Home / Self Care | Payer: Medicare Other | Source: Ambulatory Visit | Attending: Urology | Admitting: Urology

## 2016-10-09 ENCOUNTER — Inpatient Hospital Stay (HOSPITAL_COMMUNITY): Payer: Medicare Other | Admitting: Anesthesiology

## 2016-10-09 ENCOUNTER — Encounter (HOSPITAL_COMMUNITY)
Admission: RE | Disposition: A | Discharge status: Home / Self Care | Payer: Self-pay | Source: Ambulatory Visit | Attending: Urology

## 2016-10-09 ENCOUNTER — Encounter (HOSPITAL_COMMUNITY): Payer: Self-pay | Admitting: *Deleted

## 2016-10-09 DIAGNOSIS — I10 Essential (primary) hypertension: Secondary | ICD-10-CM | POA: Diagnosis not present

## 2016-10-09 DIAGNOSIS — Z7984 Long term (current) use of oral hypoglycemic drugs: Secondary | ICD-10-CM | POA: Diagnosis not present

## 2016-10-09 DIAGNOSIS — E7800 Pure hypercholesterolemia, unspecified: Secondary | ICD-10-CM | POA: Diagnosis not present

## 2016-10-09 DIAGNOSIS — Z882 Allergy status to sulfonamides status: Secondary | ICD-10-CM | POA: Diagnosis not present

## 2016-10-09 DIAGNOSIS — C652 Malignant neoplasm of left renal pelvis: Secondary | ICD-10-CM | POA: Diagnosis present

## 2016-10-09 DIAGNOSIS — Z91048 Other nonmedicinal substance allergy status: Secondary | ICD-10-CM | POA: Diagnosis not present

## 2016-10-09 DIAGNOSIS — M1990 Unspecified osteoarthritis, unspecified site: Secondary | ICD-10-CM | POA: Diagnosis present

## 2016-10-09 DIAGNOSIS — Z888 Allergy status to other drugs, medicaments and biological substances status: Secondary | ICD-10-CM | POA: Diagnosis not present

## 2016-10-09 DIAGNOSIS — Z9049 Acquired absence of other specified parts of digestive tract: Secondary | ICD-10-CM | POA: Diagnosis not present

## 2016-10-09 DIAGNOSIS — E119 Type 2 diabetes mellitus without complications: Secondary | ICD-10-CM | POA: Diagnosis present

## 2016-10-09 DIAGNOSIS — Z880 Allergy status to penicillin: Secondary | ICD-10-CM | POA: Diagnosis not present

## 2016-10-09 DIAGNOSIS — N2889 Other specified disorders of kidney and ureter: Secondary | ICD-10-CM | POA: Diagnosis present

## 2016-10-09 DIAGNOSIS — Z791 Long term (current) use of non-steroidal anti-inflammatories (NSAID): Secondary | ICD-10-CM | POA: Diagnosis not present

## 2016-10-09 HISTORY — PX: ROBOT ASSITED LAPAROSCOPIC NEPHROURETERECTOMY: SHX6077

## 2016-10-09 LAB — GLUCOSE, CAPILLARY
GLUCOSE-CAPILLARY: 136 mg/dL — AB (ref 65–99)
Glucose-Capillary: 155 mg/dL — ABNORMAL HIGH (ref 65–99)
Glucose-Capillary: 270 mg/dL — ABNORMAL HIGH (ref 65–99)

## 2016-10-09 LAB — HEMOGLOBIN AND HEMATOCRIT, BLOOD
HEMATOCRIT: 37.9 % (ref 36.0–46.0)
Hemoglobin: 12.7 g/dL (ref 12.0–15.0)

## 2016-10-09 LAB — TYPE AND SCREEN
ABO/RH(D): A POS
ANTIBODY SCREEN: NEGATIVE

## 2016-10-09 SURGERY — NEPHROURETERECTOMY, ROBOT-ASSISTED, LAPAROSCOPIC
Anesthesia: General | Laterality: Left

## 2016-10-09 MED ORDER — EPHEDRINE SULFATE-NACL 50-0.9 MG/10ML-% IV SOSY
PREFILLED_SYRINGE | INTRAVENOUS | Status: DC | PRN
  Administered 2016-10-09: 10 mg via INTRAVENOUS

## 2016-10-09 MED ORDER — LACTATED RINGERS IV SOLN: INTRAVENOUS | Status: DC

## 2016-10-09 MED ORDER — SODIUM CHLORIDE 0.9 % IJ SOLN
INTRAMUSCULAR | Status: DC | PRN
  Administered 2016-10-09: 20 mL

## 2016-10-09 MED ORDER — ONDANSETRON HCL 4 MG/2ML IJ SOLN: 4.0000 mg | INTRAMUSCULAR | Status: DC | PRN

## 2016-10-09 MED ORDER — DIPHENHYDRAMINE HCL 50 MG/ML IJ SOLN: 12.5000 mg | Freq: Four times a day (QID) | INTRAMUSCULAR | Status: DC | PRN

## 2016-10-09 MED ORDER — LIDOCAINE 2% (20 MG/ML) 5 ML SYRINGE
INTRAMUSCULAR | Status: DC | PRN
  Administered 2016-10-09: 100 mg via INTRAVENOUS

## 2016-10-09 MED ORDER — ONDANSETRON HCL 4 MG/2ML IJ SOLN
INTRAMUSCULAR | Status: AC
  Filled 2016-10-09: qty 2

## 2016-10-09 MED ORDER — FENOFIBRATE 160 MG PO TABS
160.0000 mg | ORAL_TABLET | Freq: Every day | ORAL | Status: DC
  Administered 2016-10-10: 160 mg via ORAL
  Filled 2016-10-09 (×2): qty 1

## 2016-10-09 MED ORDER — SODIUM CHLORIDE 0.9 % IJ SOLN
INTRAMUSCULAR | Status: AC
  Filled 2016-10-09: qty 50

## 2016-10-09 MED ORDER — INSULIN ASPART 100 UNIT/ML ~~LOC~~ SOLN
0.0000 [IU] | Freq: Three times a day (TID) | SUBCUTANEOUS | Status: DC
Start: 2016-10-09 — End: 2016-10-11
  Administered 2016-10-09 – 2016-10-10 (×2): 5 [IU] via SUBCUTANEOUS
  Administered 2016-10-10: 3 [IU] via SUBCUTANEOUS
  Administered 2016-10-11: 2 [IU] via SUBCUTANEOUS

## 2016-10-09 MED ORDER — DEXAMETHASONE SODIUM PHOSPHATE 10 MG/ML IJ SOLN
INTRAMUSCULAR | Status: DC | PRN
  Administered 2016-10-09: 10 mg via INTRAVENOUS

## 2016-10-09 MED ORDER — MIDAZOLAM HCL 2 MG/2ML IJ SOLN
INTRAMUSCULAR | Status: AC
  Filled 2016-10-09: qty 2

## 2016-10-09 MED ORDER — PROPOFOL 10 MG/ML IV BOLUS
INTRAVENOUS | Status: DC | PRN
  Administered 2016-10-09: 160 mg via INTRAVENOUS

## 2016-10-09 MED ORDER — MIDAZOLAM HCL 5 MG/5ML IJ SOLN
INTRAMUSCULAR | Status: DC | PRN
  Administered 2016-10-09: 2 mg via INTRAVENOUS

## 2016-10-09 MED ORDER — PROMETHAZINE HCL 25 MG/ML IJ SOLN
INTRAMUSCULAR | Status: AC
  Filled 2016-10-09: qty 1

## 2016-10-09 MED ORDER — DIPHENHYDRAMINE HCL 12.5 MG/5ML PO ELIX: 12.5000 mg | ORAL_SOLUTION | Freq: Four times a day (QID) | ORAL | Status: DC | PRN

## 2016-10-09 MED ORDER — OXYCODONE-ACETAMINOPHEN 5-325 MG PO TABS: 1.0000 | ORAL_TABLET | Freq: Four times a day (QID) | ORAL | 0 refills | Status: DC | PRN

## 2016-10-09 MED ORDER — PROMETHAZINE HCL 25 MG/ML IJ SOLN
6.2500 mg | INTRAMUSCULAR | Status: DC | PRN
  Administered 2016-10-09: 6.25 mg via INTRAVENOUS

## 2016-10-09 MED ORDER — ACETAMINOPHEN 500 MG PO TABS
1000.0000 mg | ORAL_TABLET | Freq: Four times a day (QID) | ORAL | Status: AC
  Administered 2016-10-09 – 2016-10-10 (×3): 1000 mg via ORAL
  Filled 2016-10-09 (×3): qty 2

## 2016-10-09 MED ORDER — DEXAMETHASONE SODIUM PHOSPHATE 10 MG/ML IJ SOLN
INTRAMUSCULAR | Status: AC
  Filled 2016-10-09: qty 1

## 2016-10-09 MED ORDER — LIDOCAINE 2% (20 MG/ML) 5 ML SYRINGE
INTRAMUSCULAR | Status: AC
  Filled 2016-10-09: qty 5

## 2016-10-09 MED ORDER — FENTANYL CITRATE (PF) 100 MCG/2ML IJ SOLN
INTRAMUSCULAR | Status: DC | PRN
  Administered 2016-10-09 (×3): 50 ug via INTRAVENOUS
  Administered 2016-10-09: 100 ug via INTRAVENOUS

## 2016-10-09 MED ORDER — LACTATED RINGERS IV SOLN
INTRAVENOUS | Status: DC | PRN
  Administered 2016-10-09 (×2): via INTRAVENOUS

## 2016-10-09 MED ORDER — HYDROMORPHONE HCL 1 MG/ML IJ SOLN: 0.5000 mg | INTRAMUSCULAR | Status: DC | PRN

## 2016-10-09 MED ORDER — PROPOFOL 10 MG/ML IV BOLUS
INTRAVENOUS | Status: AC
  Filled 2016-10-09: qty 40

## 2016-10-09 MED ORDER — OXYCODONE HCL 5 MG PO TABS
5.0000 mg | ORAL_TABLET | ORAL | Status: DC | PRN
  Administered 2016-10-09: 5 mg via ORAL
  Filled 2016-10-09: qty 1

## 2016-10-09 MED ORDER — SODIUM CHLORIDE 0.45 % IV SOLN
INTRAVENOUS | Status: DC
  Administered 2016-10-09 – 2016-10-11 (×4): via INTRAVENOUS

## 2016-10-09 MED ORDER — BUPIVACAINE LIPOSOME 1.3 % IJ SUSP
INTRAMUSCULAR | Status: AC
  Filled 2016-10-09: qty 20

## 2016-10-09 MED ORDER — HYDROMORPHONE HCL 2 MG/ML IJ SOLN
INTRAMUSCULAR | Status: AC
  Filled 2016-10-09: qty 1

## 2016-10-09 MED ORDER — LACTATED RINGERS IR SOLN
Status: DC | PRN
  Administered 2016-10-09: 1000 mL

## 2016-10-09 MED ORDER — SUGAMMADEX SODIUM 500 MG/5ML IV SOLN
INTRAVENOUS | Status: AC
  Filled 2016-10-09: qty 5

## 2016-10-09 MED ORDER — ONDANSETRON HCL 4 MG/2ML IJ SOLN
INTRAMUSCULAR | Status: DC | PRN
  Administered 2016-10-09: 4 mg via INTRAVENOUS

## 2016-10-09 MED ORDER — MEPERIDINE HCL 50 MG/ML IJ SOLN: 6.2500 mg | INTRAMUSCULAR | Status: DC | PRN

## 2016-10-09 MED ORDER — STERILE WATER FOR IRRIGATION IR SOLN
Status: DC | PRN
  Administered 2016-10-09: 1000 mL

## 2016-10-09 MED ORDER — PRAVASTATIN SODIUM 40 MG PO TABS
40.0000 mg | ORAL_TABLET | Freq: Every evening | ORAL | Status: DC
  Administered 2016-10-09 – 2016-10-10 (×2): 40 mg via ORAL
  Filled 2016-10-09 (×2): qty 1

## 2016-10-09 MED ORDER — EPHEDRINE 5 MG/ML INJ
INTRAVENOUS | Status: AC
  Filled 2016-10-09: qty 10

## 2016-10-09 MED ORDER — ROCURONIUM BROMIDE 50 MG/5ML IV SOSY
PREFILLED_SYRINGE | INTRAVENOUS | Status: AC
  Filled 2016-10-09: qty 10

## 2016-10-09 MED ORDER — NAPHAZOLINE-GLYCERIN 0.012-0.2 % OP SOLN
1.0000 [drp] | Freq: Four times a day (QID) | OPHTHALMIC | Status: DC | PRN
  Filled 2016-10-09: qty 15

## 2016-10-09 MED ORDER — SUGAMMADEX SODIUM 200 MG/2ML IV SOLN
INTRAVENOUS | Status: DC | PRN
  Administered 2016-10-09: 400 mg via INTRAVENOUS

## 2016-10-09 MED ORDER — ROCURONIUM BROMIDE 10 MG/ML (PF) SYRINGE
PREFILLED_SYRINGE | INTRAVENOUS | Status: DC | PRN
  Administered 2016-10-09: 50 mg via INTRAVENOUS
  Administered 2016-10-09: 20 mg via INTRAVENOUS

## 2016-10-09 MED ORDER — HYDROMORPHONE HCL 1 MG/ML IJ SOLN
0.2500 mg | INTRAMUSCULAR | Status: DC | PRN
  Administered 2016-10-09 (×2): 0.5 mg via INTRAVENOUS

## 2016-10-09 MED ORDER — CLINDAMYCIN PHOSPHATE 900 MG/50ML IV SOLN
900.0000 mg | INTRAVENOUS | Status: AC
  Administered 2016-10-09: 900 mg via INTRAVENOUS

## 2016-10-09 MED ORDER — CLINDAMYCIN PHOSPHATE 900 MG/50ML IV SOLN
INTRAVENOUS | Status: AC
  Filled 2016-10-09: qty 50

## 2016-10-09 MED ORDER — FENTANYL CITRATE (PF) 250 MCG/5ML IJ SOLN
INTRAMUSCULAR | Status: AC
  Filled 2016-10-09: qty 5

## 2016-10-09 MED ORDER — BUPIVACAINE LIPOSOME 1.3 % IJ SUSP
INTRAMUSCULAR | Status: DC | PRN
  Administered 2016-10-09: 20 mL

## 2016-10-09 SURGICAL SUPPLY — 64 items
ADH SKN CLS APL DERMABOND .7 (GAUZE/BANDAGES/DRESSINGS) ×2
BAG LAPAROSCOPIC 12 15 PORT 16 (BASKET) ×1 IMPLANT
BAG RETRIEVAL 12/15 (BASKET) ×2
BAG RETRIEVAL 12/15MM (BASKET) ×1
CHLORAPREP W/TINT 26ML (MISCELLANEOUS) ×3 IMPLANT
CLIP LIGATING HEM O LOK PURPLE (MISCELLANEOUS) ×3 IMPLANT
CLIP LIGATING HEMO LOK XL GOLD (MISCELLANEOUS) ×2 IMPLANT
CLIP LIGATING HEMO O LOK GREEN (MISCELLANEOUS) ×4 IMPLANT
COVER SURGICAL LIGHT HANDLE (MISCELLANEOUS) ×3 IMPLANT
COVER TIP SHEARS 8 DVNC (MISCELLANEOUS) ×1 IMPLANT
COVER TIP SHEARS 8MM DA VINCI (MISCELLANEOUS) ×2
DECANTER SPIKE VIAL GLASS SM (MISCELLANEOUS) ×3 IMPLANT
DERMABOND ADVANCED (GAUZE/BANDAGES/DRESSINGS) ×4
DERMABOND ADVANCED .7 DNX12 (GAUZE/BANDAGES/DRESSINGS) ×2 IMPLANT
DRAIN CHANNEL 15F RND FF 3/16 (WOUND CARE) ×3 IMPLANT
DRAPE ARM DVNC X/XI (DISPOSABLE) ×4 IMPLANT
DRAPE COLUMN DVNC XI (DISPOSABLE) ×1 IMPLANT
DRAPE DA VINCI XI ARM (DISPOSABLE) ×8
DRAPE DA VINCI XI COLUMN (DISPOSABLE) ×2
DRAPE INCISE IOBAN 66X45 STRL (DRAPES) ×3 IMPLANT
DRAPE SHEET LG 3/4 BI-LAMINATE (DRAPES) ×3 IMPLANT
ELECT PENCIL ROCKER SW 15FT (MISCELLANEOUS) ×3 IMPLANT
ELECT REM PT RETURN 9FT ADLT (ELECTROSURGICAL) ×3
ELECTRODE REM PT RTRN 9FT ADLT (ELECTROSURGICAL) ×1 IMPLANT
EVACUATOR SILICONE 100CC (DRAIN) ×1 IMPLANT
GLOVE BIO SURGEON STRL SZ 6.5 (GLOVE) ×2 IMPLANT
GLOVE BIO SURGEONS STRL SZ 6.5 (GLOVE) ×1
GLOVE BIOGEL M STRL SZ7.5 (GLOVE) ×6 IMPLANT
GOWN STRL REUS W/TWL LRG LVL3 (GOWN DISPOSABLE) ×9 IMPLANT
IRRIG SUCT STRYKERFLOW 2 WTIP (MISCELLANEOUS)
IRRIGATION SUCT STRKRFLW 2 WTP (MISCELLANEOUS) IMPLANT
KIT BASIN OR (CUSTOM PROCEDURE TRAY) ×3 IMPLANT
LOOP VESSEL MAXI BLUE (MISCELLANEOUS) ×2 IMPLANT
MARKER SKIN DUAL TIP RULER LAB (MISCELLANEOUS) ×3 IMPLANT
NDL INSUFFLATION 14GA 120MM (NEEDLE) ×1 IMPLANT
NEEDLE INSUFFLATION 14GA 120MM (NEEDLE) ×3 IMPLANT
NS IRRIG 1000ML POUR BTL (IV SOLUTION) ×3 IMPLANT
PORT ACCESS TROCAR AIRSEAL 12 (TROCAR) ×1 IMPLANT
PORT ACCESS TROCAR AIRSEAL 5M (TROCAR) ×2
POSITIONER SURGICAL ARM (MISCELLANEOUS) ×6 IMPLANT
RELOAD STAPLE 60 2.6 WHT THN (STAPLE) IMPLANT
RELOAD STAPLER WHITE 60MM (STAPLE) ×5 IMPLANT
SEAL CANN UNIV 5-8 DVNC XI (MISCELLANEOUS) ×4 IMPLANT
SEAL XI 5MM-8MM UNIVERSAL (MISCELLANEOUS) ×8
SET TRI-LUMEN FLTR TB AIRSEAL (TUBING) ×3 IMPLANT
SOLUTION ELECTROLUBE (MISCELLANEOUS) ×3 IMPLANT
STAPLE ECHEON FLEX 60 POW ENDO (STAPLE) ×2 IMPLANT
STAPLER RELOAD WHITE 60MM (STAPLE) ×15
SUT ETHILON 3 0 PS 1 (SUTURE) ×3 IMPLANT
SUT MNCRL AB 4-0 PS2 18 (SUTURE) ×6 IMPLANT
SUT PDS AB 1 CT1 27 (SUTURE) ×11 IMPLANT
SUT VIC AB 2-0 SH 27 (SUTURE) ×3
SUT VIC AB 2-0 SH 27X BRD (SUTURE) ×1 IMPLANT
SUT VLOC BARB 180 ABS3/0GR12 (SUTURE) ×3
SUTURE VLOC BRB 180 ABS3/0GR12 (SUTURE) ×1 IMPLANT
TAPE STRIPS DRAPE STRL (GAUZE/BANDAGES/DRESSINGS) ×1 IMPLANT
TOWEL OR 17X26 10 PK STRL BLUE (TOWEL DISPOSABLE) ×3 IMPLANT
TOWEL OR NON WOVEN STRL DISP B (DISPOSABLE) ×3 IMPLANT
TRAY FOLEY W/METER SILVER 16FR (SET/KITS/TRAYS/PACK) ×3 IMPLANT
TRAY LAPAROSCOPIC (CUSTOM PROCEDURE TRAY) ×3 IMPLANT
TROCAR BLADELESS OPT 12M 100M (ENDOMECHANICALS) ×3 IMPLANT
TROCAR BLADELESS OPT 5 100 (ENDOMECHANICALS) IMPLANT
TROCAR XCEL 12X100 BLDLESS (ENDOMECHANICALS) ×3 IMPLANT
WATER STERILE IRR 1500ML POUR (IV SOLUTION) ×3 IMPLANT

## 2016-10-09 NOTE — Progress Notes (Signed)
Patient ambulated 360 ft. activity was tolerated well

## 2016-10-09 NOTE — Brief Op Note (Signed)
10/09/2016  10:43 AM  PATIENT:  Tasha Tucker  66 y.o. female  PRE-OPERATIVE DIAGNOSIS:  LEFT RENAL PELVIS MASS  POST-OPERATIVE DIAGNOSIS:  LEFT RENAL PELVIS MASS  PROCEDURE:  Procedure(s): XI ROBOT ASSITED LAPAROSCOPIC NEPHROURETERECTOMY (Left)  SURGEON:  Surgeon(s) and Role:    * Alexis Frock, MD - Primary  PHYSICIAN ASSISTANT:   ASSISTANTS: Debbrah Alar PA   ANESTHESIA:   local and general  EBL:  Total I/O In: 1700 [I.V.:1700] Out: 200 [Urine:150; Blood:50]  BLOOD ADMINISTERED:none  DRAINS: 1 -JP to bulb, 2- Foley to gravity   LOCAL MEDICATIONS USED:  MARCAINE     SPECIMEN:  Source of Specimen:  1- left nephroureterectomy, 2- perihilar lymph node  DISPOSITION OF SPECIMEN:  PATHOLOGY  COUNTS:  YES  TOURNIQUET:  * No tourniquets in log *  DICTATION: .Other Dictation: Dictation Number [id number]  PLAN OF CARE: Admit to inpatient   PATIENT DISPOSITION:  PACU - hemodynamically stable.   Delay start of Pharmacological VTE agent (>24hrs) due to surgical blood loss or risk of bleeding: yes

## 2016-10-09 NOTE — Anesthesia Procedure Notes (Signed)
Procedure Name: Intubation Date/Time: 10/09/2016 7:30 AM Performed by: Talbot Grumbling Pre-anesthesia Checklist: Patient identified, Emergency Drugs available, Suction available and Patient being monitored Patient Re-evaluated:Patient Re-evaluated prior to inductionOxygen Delivery Method: Circle system utilized Preoxygenation: Pre-oxygenation with 100% oxygen Intubation Type: IV induction Ventilation: Mask ventilation without difficulty Laryngoscope Size: Mac and 4 Grade View: Grade II Tube type: Oral Tube size: 7.5 mm Number of attempts: 1 Airway Equipment and Method: Stylet Placement Confirmation: ETT inserted through vocal cords under direct vision,  positive ETCO2 and breath sounds checked- equal and bilateral Secured at: 22 cm Tube secured with: Tape Dental Injury: Teeth and Oropharynx as per pre-operative assessment

## 2016-10-09 NOTE — H&P (Signed)
Tasha Tucker is an 66 y.o. female.    Chief Complaint: Pre-op LEFT Robotic Nephroureterectomy  HPI:   1 - Left Renal Pelvis Cancer - large left proximal ureter and mid pole soft tissue density mass and filling defect on CT Urogram 07/2016 on eval gross hematuria. 2 artery (lower accessory) 1 vein (small lumbar noted) left renovascular anatomy. 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 She has taken actos for years and has not h/o smoking or chronic solvent exposure.    PMH sig for obesity, lap hyst (benign), BTL, lap chole, DM2 (no neuropathy). Her PCP is Steva Colder MD.   Today "Tasha Tucker" is seen to proceed with LEFT nephroureterectomy. Most recent UCX non-clonal.    Past Medical History:  Diagnosis Date  . Diabetes mellitus without complication (HCC)    Type 2  . DJD (degenerative joint disease)    NECK, NEEDS PILLOW WHEN LYING FLAT IS POSSIBLE  . Hemangioma    OF LIVER  . Hematuria   . High cholesterol   . Hypertension   . Pelvic mass   . Pneumonia 20 OR 30 YRS AGO  . PONV (postoperative nausea and vomiting)    OCC NAUSEA  . Skin abnormalities    LEFT 3RD FINGER HEALING   . Trigger finger, left    THUMB    Past Surgical History:  Procedure Laterality Date  . ABDOMINAL HYSTERECTOMY  1990   PARTIAL  . CEREBRAL ANEURYSM REPAIR  1996   clipped-no deficits RIGHT CAROTID  . CHOLECYSTECTOMY  2010  . CYSTOSCOPY WITH RETROGRADE PYELOGRAM, URETEROSCOPY AND STENT PLACEMENT Left 08/26/2016   Procedure: CYSTOSCOPY WITH BILATERAL RETROGRADE PYELOGRAM,LEFT URETEROSCOPY WITH BIOPSY AND LEFT STENT PLACEMENT;  Surgeon: Alexis Frock, MD;  Location: [facility];  Service: Urology;  Laterality: Left;  . EYE SURGERY Bilateral 2017 ,2016   cataract w/lens  . SURGERY ON FOOT FOR STAPH INFECTION  1989  . TUBAL LIGATION  1980    History reviewed. No pertinent family history. Social History:  reports that she has never smoked. She  has never used smokeless tobacco. She reports that she drinks alcohol. She reports that she does not use drugs.  Allergies:  Allergies  Allergen Reactions  . Adhesive [Tape] Rash    PAPER TAPE ONLY  . Chlorhexidine Gluconate Itching and Rash    Topical antiseptic (CHG WIPES)   . Dilantin [Phenytoin] Rash  . Penicillins Rash    Has patient had a PCN reaction causing immediate rash, facial/tongue/throat swelling, SOB or lightheadedness with hypotension:unsure Has patient had a PCN reaction causing severe rash involving mucus membranes or skin necrosis:unsure Has patient had a PCN reaction that required hospitalization:No Has patient had a PCN reaction occurring within the last 10 years:No If all of the above answers are "NO", then may proceed with Cephalosporin use.   . Sulfa Antibiotics Rash    Medications Prior to Admission  Medication Sig Dispense Refill  . Biotin 1000 MCG tablet Take 1,000 mcg by mouth daily.    . cetirizine (ZYRTEC) 10 MG tablet Take 10 mg by mouth as needed for allergies.    . fenofibrate micronized (ANTARA) 130 MG capsule Take 130 mg by mouth daily.    . meloxicam (MOBIC) 15 MG tablet Take 15 mg by mouth daily as needed for pain.    . metFORMIN (GLUCOPHAGE) 1000 MG tablet Take 1,000 mg by mouth 2 (two) times daily.     . pravastatin (PRAVACHOL) 40 MG tablet Take 40 mg  by mouth every evening.    . quinapril (ACCUPRIL) 20 MG tablet Take 20 mg by mouth daily.    . cephALEXin (KEFLEX) 500 MG capsule Take 1 capsule (500 mg total) by mouth 3 (three) times daily. (Patient not taking: Reported on 09/29/2016) 30 capsule 0  . cephALEXin (KEFLEX) 500 MG capsule Take 500 mg by mouth 2 (two) times daily. STARTING 3 DAYS BEFORE SURGERY    . oxyCODONE-acetaminophen (ROXICET) 5-325 MG tablet Take 1 tablet by mouth every 6 (six) hours as needed for moderate pain or severe pain. Post-operatively (Patient not taking: Reported on 09/29/2016) 20 tablet 0  . senna-docusate (SENOKOT-S)  8.6-50 MG tablet Take 1 tablet by mouth 2 (two) times daily. While taking strong pain meds to prevent constipation. (Patient not taking: Reported on 09/29/2016) 30 tablet 0    Results for orders placed or performed during the hospital encounter of 10/09/16 (from the past 48 hour(s))  Glucose, capillary     Status: Abnormal   Collection Time: 10/09/16  5:20 AM  Result Value Ref Range   Glucose-Capillary 136 (H) 65 - 99 mg/dL   No results found.  Review of Systems  Constitutional: Negative.  Negative for chills and fever.  HENT: Negative.   Eyes: Negative.   Respiratory: Negative.   Cardiovascular: Negative.   Gastrointestinal: Negative.   Genitourinary: Positive for urgency.  Musculoskeletal: Negative.   Skin: Negative.   Neurological: Negative.   Endo/Heme/Allergies: Negative.   Psychiatric/Behavioral: Negative.     Blood pressure (!) 157/57, pulse 79, temperature 98.5 F (36.9 C), temperature source Oral, resp. rate 18, height 5\' 8"  (1.727 m), weight 102.5 kg (226 lb), SpO2 100 %. Physical Exam  Constitutional: She appears well-developed.  HENT:  Head: Normocephalic.  Eyes: Pupils are equal, round, and reactive to light.  Neck: Normal range of motion.  Cardiovascular: Normal rate.   Respiratory: Effort normal.  GI: Soft.  Significant truncal obesity.   Genitourinary:  Genitourinary Comments: NO CVAT  Musculoskeletal: Normal range of motion.  Neurological: She is alert.  Skin: Skin is warm.  Psychiatric: She has a normal mood and affect. Her behavior is normal. Thought content normal.     Assessment/Plan  Proceed as planned with curative intent LEFT nephroureterectomy. Risks, benefits, alternatives, expected peri-op course discussed previously and reiterated today.   Alexis Frock, MD 10/09/2016, 6:25 AM

## 2016-10-09 NOTE — Anesthesia Postprocedure Evaluation (Addendum)
Anesthesia Post Note  Patient: Tasha Tucker  Procedure(s) Performed: Procedure(s) (LRB): XI ROBOT ASSITED LAPAROSCOPIC NEPHROURETERECTOMY (Left)  Patient location during evaluation: PACU Anesthesia Type: General Level of consciousness: awake and alert Pain management: pain level controlled Vital Signs Assessment: post-procedure vital signs reviewed and stable Respiratory status: spontaneous breathing, nonlabored ventilation, respiratory function stable and patient connected to nasal cannula oxygen Cardiovascular status: blood pressure returned to baseline and stable Postop Assessment: no signs of nausea or vomiting Anesthetic complications: no       Last Vitals:  Vitals:   10/09/16 1145 10/09/16 1200  BP: (!) 144/57 (!) 147/61  Pulse: 79 83  Resp: 15 17  Temp:  36.3 C    Last Pain:  Vitals:   10/09/16 1200  TempSrc:   PainSc: [place]

## 2016-10-09 NOTE — Transfer of Care (Signed)
Immediate Anesthesia Transfer of Care Note  Patient: Tasha Tucker  Procedure(s) Performed: Procedure(s): XI ROBOT ASSITED LAPAROSCOPIC NEPHROURETERECTOMY (Left)  Patient Location: PACU  Anesthesia Type:General  Level of Consciousness:  sedated, patient cooperative and responds to stimulation  Airway & Oxygen Therapy:Patient Spontanous Breathing and Patient connected to face mask oxgen  Post-op Assessment:  Report given to PACU RN and Post -op Vital signs reviewed and stable  Post vital signs:  Reviewed and stable  Last Vitals:  Vitals:   10/09/16 0530  BP: (!) 157/57  Pulse: 79  Resp: 18  Temp: 07.6 C    Complications: No apparent anesthesia complications

## 2016-10-10 LAB — HEMOGLOBIN AND HEMATOCRIT, BLOOD
HCT: 35.5 % — ABNORMAL LOW (ref 36.0–46.0)
HEMOGLOBIN: 11.9 g/dL — AB (ref 12.0–15.0)

## 2016-10-10 LAB — BASIC METABOLIC PANEL
Anion gap: 7 (ref 5–15)
BUN: 22 mg/dL — ABNORMAL HIGH (ref 6–20)
CHLORIDE: 102 mmol/L (ref 101–111)
CO2: 23 mmol/L (ref 22–32)
CREATININE: 1.72 mg/dL — AB (ref 0.44–1.00)
Calcium: 8 mg/dL — ABNORMAL LOW (ref 8.9–10.3)
GFR calc Af Amer: 34 mL/min — ABNORMAL LOW (ref >60)
GFR calc non Af Amer: 30 mL/min — ABNORMAL LOW (ref >60)
Glucose, Bld: 155 mg/dL — ABNORMAL HIGH (ref 65–99)
Potassium: 4.3 mmol/L (ref 3.5–5.1)
SODIUM: 132 mmol/L — AB (ref 135–145)

## 2016-10-10 LAB — GLUCOSE, CAPILLARY
GLUCOSE-CAPILLARY: 208 mg/dL — AB (ref 65–99)
GLUCOSE-CAPILLARY: 161 mg/dL — AB (ref 65–99)
Glucose-Capillary: 118 mg/dL — ABNORMAL HIGH (ref 65–99)
Glucose-Capillary: 169 mg/dL — ABNORMAL HIGH (ref 65–99)

## 2016-10-10 MED ORDER — ACETAMINOPHEN 325 MG PO TABS
650.0000 mg | ORAL_TABLET | ORAL | Status: DC | PRN
  Administered 2016-10-10 – 2016-10-11 (×2): 650 mg via ORAL
  Filled 2016-10-10 (×2): qty 2

## 2016-10-10 NOTE — Progress Notes (Signed)
1 Day Post-Op Subjective: Patient reports incisional pain, tolerating PO and pain control good.   Objective: Vital signs in last 24 hours: Temp:  [97.5 F (36.4 C)-97.7 F (36.5 C)] 97.7 F (36.5 C) (01/13 1432) Pulse Rate:  [70-81] 80 (01/13 1432) Resp:  [16-18] 16 (01/13 1432) BP: (127-139)/(40-74) 139/48 (01/13 1432) SpO2:  [98 %-100 %] 100 % (01/13 1432)  Intake/Output from previous day: 01/12 0701 - 01/13 0700 In: 3225 [I.V.:3225] Out: 3195 [Urine:3025; Drains:120; Blood:50] Intake/Output this shift: No intake/output data recorded.  Physical Exam:  General:alert, cooperative and appears stated age GI: tenderness: incisions Female genitalia: not done Extremities: extremities normal, atraumatic, no cyanosis or edema  Lab Results:  Recent Labs  10/09/16 1119 10/10/16 0524  HGB 12.7 11.9*  HCT 37.9 35.5*   BMET  Recent Labs  10/10/16 0524  NA 132*  K 4.3  CL 102  CO2 23  GLUCOSE 155*  BUN 22*  CREATININE 1.72*  CALCIUM 8.0*   No results for input(s): LABPT, INR in the last 72 hours. No results for input(s): LABURIN in the last 72 hours. Results for orders placed or performed during the hospital encounter of 08/16/16  Urine culture     Status: Abnormal   Collection Time: 08/16/16  4:50 AM  Result Value Ref Range Status   Specimen Description URINE, RANDOM  Final   Special Requests NONE  Final   Culture (A)  Final    <10,000 COLONIES/mL INSIGNIFICANT GROWTH Performed at [facility]    Report Status 08/17/2016 FINAL  Final    Studies/Results: No results found.  Assessment/Plan: 65yo POD #1 left nephroureterectomy  1. Regular diet 2. SLIVF 3. Continue ambulation in halls TID-QID 4. Discharge home tomorrow   LOS: 1 day   Nicolette Bang 10/10/2016, 7:35 PM

## 2016-10-11 LAB — GLUCOSE, CAPILLARY: GLUCOSE-CAPILLARY: 134 mg/dL — AB (ref 65–99)

## 2016-10-11 NOTE — Progress Notes (Signed)
D/C instructions reviewed w/ pt and dtr. Both verbalize understanding and all questions answered. Pt d/c in w/c in stable condition by NT to dtr's car. Pt in possession of d/c instructions, script, foley/leg bag and drain care supplies and all personal belongings.

## 2016-10-11 NOTE — Discharge Instructions (Signed)
JP Drain [employer] this sheet to all of your post-operative appointments while you have your drains.  Please measure your drains by CC's or ML's.  Make sure you drain and measure your JP Drains 3 times per day.  At the end of each day, add up totals for the left side and add up totals for the right side.    ( 9 am )     ( 3 pm )        ( 9 pm )                Date L  R  L  R  L  R  Total L/R                                                                                                                                                                                      1- Drain Sites - You may have some mild persistent drainage from old drain site for several days, this is normal. This can be covered with cotton gauze for convenience.  2 - Stiches - Your stitches are all dissolvable. You may notice a "loose thread" at your incisions, these are normal and require no intervention. You may cut them flush to the skin with fingernail clippers if needed for comfort.  3 - Diet - No restrictions  4 - Activity - No heavy lifting / straining (any activities that require valsalva or "bearing down") x 4 weeks. Otherwise, no restrictions.  5 - Bathing - You may shower immediately. Do not take a bath or get into swimming pool where incision sites are submersed in water x 4 weeks.   6 - Catheter - Will remain in place until removed at your next appointment. It may be cleaned with soap and water in the shower. It may be disconnected from the drain bag while in the shower to avoid tripping over the tube. You may apply Neosporin or Vaseline ointment as needed to the tip of the penis where the catheter inserts to reduce friction and irritation in this spot.   7 - When to Call the Doctor - Call MD for any fever >102, any acute wound problems, or any severe nausea / vomiting. You can call the [facility] [phone]) 24 hours a day 365 days a year. It will roll-over to the  answering service and on-call physician after hours.  You may resume aspirin, advil, aleve, vitamins, and supplements 7 days after surgery.

## 2016-10-12 ENCOUNTER — Encounter (HOSPITAL_COMMUNITY): Payer: Self-pay | Admitting: Urology

## 2016-10-12 DIAGNOSIS — C652 Malignant neoplasm of left renal pelvis: Secondary | ICD-10-CM | POA: Diagnosis present

## 2016-10-12 LAB — GLUCOSE, CAPILLARY: GLUCOSE-CAPILLARY: 241 mg/dL — AB (ref 65–99)

## 2016-10-12 NOTE — Op Note (Signed)
NAME:  Tasha Tucker, Tasha Tucker               ACCOUNT NO.:  [id number]  MEDICAL RECORD NO.:  [id number]  LOCATION:                                 FACILITY:  PHYSICIAN:  Alexis Frock, MD     DATE OF BIRTH:  [birth year]  DATE OF PROCEDURE: 10/09/2016                                OPERATIVE REPORT   DIAGNOSIS:  Left renal pelvis cancer.  PROCEDURE:  Robotic assisted laparoscopic left radical nephroureterectomy.  ESTIMATED BLOOD LOSS:  50 mL.  COMPLICATION:  None.  SPECIMEN: 1. Left perihilar lymph node. 2. Left nephroureterectomy (kidney, left ureter, and bladder cuff with     proximal ureteral stent en bloc).  FINDINGS: 1. Two artery single vein with lumbar vein left renovascular anatomy     as anticipated. 2. Some mild desmoplastic reaction around the area of hilum left     ureter as anticipated.  INDICATION:  Tasha Tucker is a pleasant 66 year old lady who on workup of gross hematuria was found to have an enhancing left renal pelvis mass that was clinically localized.  She underwent a ureteroscopic exam and biopsy, which revealed high-grade urothelial carcinoma.  A fairly large volume that was not amenable to complete endoscopic management, given localized disease, options were discussed including palliative only treatments versus curative intent, nephroureterectomy, and she wished to proceed with the latter.  Informed consent was obtained and placed in the medical record.  PROCEDURE IN DETAIL:  The patient being Tasha Tucker verified. Procedure being left robotic-assisted nephroureterectomy was confirmed. Procedure was carried out performed.  Intravenous antibiotics were administered.  General endotracheal anesthesia introduced.  Patient placed into a left side up full flank position, applying 15 degrees of stable flexion, superior arm elevator, axillary roll, sequential compression devices, bottom leg bent, top leg straight.  A Foley catheter had been placed per urethra to  straight drain.  She was further fashioned to the operating table using a bean bag and then large tape across her supraxiphoid chest and her pelvis.  She is found to be suitably positioned.  Sterile field was created by using iodine prep on her left flank and abdomen.  Next, a high-flow, low-pressure pneumoperitoneum was obtained using Veress technique in the left lower quadrant having passed the aspiration and drop test.  An 8-mm robotic camera port was placed in position approximately 1 handbreadth superior lateral to the umbilicus.  Laparoscopic examination of the peritoneal cavity revealed no significant adhesions and no visceral injury. Additional ports were then placed as follows; left subcostal 8-mm robotic port, left far lateral 8-mm robotic port approximately 4 fingerbreadths superior medial to the anterior iliac spine, left paramedian inferior robotic port approximately 4 fingerbreadths superior to the pubic ramus, and 2 12 mm assistant ports in the midline; one, 2 fingerbreadths below the camera port and another in the infraumbilical crease with the inferior most 1 being AirSeal type robot was then docked and passed through the electronic checks.  Initial attention was directed at development of the retroperitoneum.  Incision was made lateral to the descending colon from the area of the splenic flexure towards the area of the internal ring.  The colon was carefully swept medially.  The [employer]  fascia, lower pole of kidney was identified placed on gentle lateral traction.  Dissection proceeded medial to this. The left gonadal vein and ureter were encountered and also placed on gentle lateral traction.  The ureter was marked with vessel loop and posterior peritoneal incision was carried further towards the pelvis very close to the area of the left mid-umbilical ligament, later used as a floor for the ureteral dissection.  The area between the ureter, psoas musculature, and aorta  was carefully mobilized, and this triangle was developed towards the area of the renal hilum.  Renal hilum consisted of 2 artery, 1 vein with early lumbar branch as anticipated.  The lower artery was controlled using medium hemoclip followed by vascular stapler distally.  The main hilum was controlled by first controlling the small lumbar vein with vascular load stapler, which allowed better visible access to the single artery, which was circumferentially mobilized and controlled using extra-large clip proximally followed by vascular load stapler distally.  Taking this very proximally and inherently giving lymphatic tissue and several lymph nodes with the kidney specimen. There was a single small not pathologically enlarged, firm lymph node that was adherent, dissected free during these maneuvers and this was separately set aside as a left perihilar lymph node.  The vein was circumferentially mobilized and controlled using vascular stapler at the level proximal to the adrenal branch.  A nonadrenal sparing technique was used, one more firing of vascular stapler was used taking down attachments between the aorta and the medial aspect of the adrenal gland.  Superior attachments were taken down using cautery scissors. The phrenic branch of the adrenal was ligated, lateral attachments were taken down with cautery scissors.  This completely freed up the kidney portion of the specimen.  Circumferential mobilization was then performed around the ureter towards the area of the deep pelvis.  The iliac vessels were encountered and prospectively identified.  Ureters very carefully swept away from this medially and circumferential dissection was continued down to the level of the ureterovesical junction.  Superior vesical artery was also identified.  A stay stitch of 3-0 V-Loc was applied on the medial aspect of the ureterovesical junction and the bladder was then entered keeping a small bladder  cuff with the distal ureter just prior to this.  An extra-large clip was applied across the ureter and stent in situ to prevent anterior grade spillage.  This completely freed up the nephroureterectomy specimen, was placed into an EndoCatch bag for later retrieval.  The small cystotomy was then closed using the previous stay stitch running across the bladder cystotomy x2.  Resulted in excellent visible reapproximation of the bladder.  Following these maneuvers, all hemostasis was excellent. All sponge, needle, counts were correct.  Robot was then undocked.  The superior most assistant port site was closed at the level of the fascia using Carter-Thomason suture passer and 0 Vicryl under direct laparoscopic vision.  The specimen was retrieved by extending the inferior assistant port site, superiorly for total distance approximately 6 cm removing the nephroureterectomy specimen and setting it aside for permanent pathology.  The extraction site was closed at the level of the fascia using figure-of-eight PDS x6 followed by reapproximation of Scarpa's with running Vicryl.  All incision sites were infiltrated with dilute lyophilized Marcaine.  A closed suction drain had been applied to the lateral most robotic port site under laparoscopic vision.  Prior to cessation of pneumoperitoneum drain, stitch was applied to that and all incision sites were closed at the  level of the skin using subcuticular Monocryl followed by Dermabond. Procedure then terminated.  The patient tolerated the procedure well. There were no immediate periprocedural complications.  The patient was taken to the postanesthesia care in stable condition.   ______________________________ Alexis Frock, MD   ______________________________ Alexis Frock, MD   TM/MEDQ  D:  10/09/2016  T:  10/10/2016  Job:  [id number]

## 2016-10-23 NOTE — Op Note (Signed)
NAME:  SIMONA, ROCQUE               ACCOUNT NO.:  [id number]  MEDICAL RECORD NO.:  [id number]  LOCATION:                                 FACILITY:  PHYSICIAN:  Alexis Frock, MD     DATE OF BIRTH:  [birth year]  DATE OF PROCEDURE: 10/09/2016                                OPERATIVE REPORT (ammended)   DIAGNOSIS:  Left renal pelvis cancer.  PROCEDURE:  Robotic assisted laparoscopic left radical nephroureterectomy.  ESTIMATED BLOOD LOSS:  50 mL.  COMPLICATION:  None.  SPECIMEN: 1. Left perihilar lymph node. 2. Left nephroureterectomy (kidney, left ureter, and bladder cuff with     proximal ureteral stent en bloc).  ASSISTANT: Debbrah Alar, PA  FINDINGS: 1. Two artery single vein with lumbar vein left renovascular anatomy     as anticipated. 2. Some mild desmoplastic reaction around the area of hilum left     ureter as anticipated.  INDICATION:  Ms. Griffitts is a pleasant 66 year old lady who on workup of gross hematuria was found to have an enhancing left renal pelvis mass that was clinically localized.  She underwent a ureteroscopic exam and biopsy, which revealed high-grade urothelial carcinoma.  A fairly large volume that was not amenable to complete endoscopic management, given localized disease, options were discussed including palliative only treatments versus curative intent, nephroureterectomy, and she wished to proceed with the latter.  Informed consent was obtained and placed in the medical record.  PROCEDURE IN DETAIL:  The patient being Jearldean Gutt verified. Procedure being left robotic-assisted nephroureterectomy was confirmed. Procedure was carried out performed.  Intravenous antibiotics were administered.  General endotracheal anesthesia introduced.  Patient placed into a left side up full flank position, applying 15 degrees of stable flexion, superior arm elevator, axillary roll, sequential compression devices, bottom leg bent, top leg  straight.  A Foley catheter had been placed per urethra to straight drain.  She was further fashioned to the operating table using a bean bag and then large tape across her supraxiphoid chest and her pelvis.  She is found to be suitably positioned.  Sterile field was created by using iodine prep on her left flank and abdomen.  Next, a high-flow, low-pressure pneumoperitoneum was obtained using Veress technique in the left lower quadrant having passed the aspiration and drop test.  An 8-mm robotic camera port was placed in position approximately 1 handbreadth superior lateral to the umbilicus.  Laparoscopic examination of the peritoneal cavity revealed no significant adhesions and no visceral injury. Additional ports were then placed as follows; left subcostal 8-mm robotic port, left far lateral 8-mm robotic port approximately 4 fingerbreadths superior medial to the anterior iliac spine, left paramedian inferior robotic port approximately 4 fingerbreadths superior to the pubic ramus, and 2 12 mm assistant ports in the midline; one, 2 fingerbreadths below the camera port and another in the infraumbilical crease with the inferior most 1 being AirSeal type robot was then docked and passed through the electronic checks.  Initial attention was directed at development of the retroperitoneum.  Incision was made lateral to the descending colon from the area of the splenic flexure towards the area of the internal ring.  The colon was  carefully swept medially.  The Gerota's fascia, lower pole of kidney was identified placed on gentle lateral traction.  Dissection proceeded medial to this. The left gonadal vein and ureter were encountered and also placed on gentle lateral traction.  The ureter was marked with vessel loop and posterior peritoneal incision was carried further towards the pelvis very close to the area of the left mid-umbilical ligament, later used as a floor for the ureteral dissection.   The area between the ureter, psoas musculature, and aorta was carefully mobilized, and this triangle was developed towards the area of the renal hilum.  Renal hilum consisted of 2 artery, 1 vein with early lumbar branch as anticipated.  The lower artery was controlled using medium hemoclip followed by vascular stapler distally.  The main hilum was controlled by first controlling the small lumbar vein with vascular load stapler, which allowed better visible access to the single artery, which was circumferentially mobilized and controlled using extra-large clip proximally followed by vascular load stapler distally.  Taking this very proximally and inherently giving lymphatic tissue and several lymph nodes with the kidney specimen. There was a single small not pathologically enlarged, firm lymph node that was adherent, dissected free during these maneuvers and this was separately set aside as a left perihilar lymph node.  The vein was circumferentially mobilized and controlled using vascular stapler at the level proximal to the adrenal branch.  A nonadrenal sparing technique was used, one more firing of vascular stapler was used taking down attachments between the aorta and the medial aspect of the adrenal gland.  Superior attachments were taken down using cautery scissors. The phrenic branch of the adrenal was ligated, lateral attachments were taken down with cautery scissors.  This completely freed up the kidney portion of the specimen.  Circumferential mobilization was then performed around the ureter towards the area of the deep pelvis.  The iliac vessels were encountered and prospectively identified.  Ureters very carefully swept away from this medially and circumferential dissection was continued down to the level of the ureterovesical junction.  Superior vesical artery was also identified.  A stay stitch of 3-0 V-Loc was applied on the medial aspect of the ureterovesical junction and  the bladder was then entered keeping a small bladder cuff with the distal ureter just prior to this.  An extra-large clip was applied across the ureter and stent in situ to prevent anterior grade spillage.  This completely freed up the nephroureterectomy specimen, was placed into an EndoCatch bag for later retrieval.  The small cystotomy was then closed using the previous stay stitch running across the bladder cystotomy x2.  Resulted in excellent visible reapproximation of the bladder.  Following these maneuvers, all hemostasis was excellent. All sponge, needle, counts were correct.  Robot was then undocked.  The superior most assistant port site was closed at the level of the fascia using Carter-Thomason suture passer and 0 Vicryl under direct laparoscopic vision.  The specimen was retrieved by extending the inferior assistant port site, superiorly for total distance approximately 6 cm removing the nephroureterectomy specimen and setting it aside for permanent pathology.  The extraction site was closed at the level of the fascia using figure-of-eight PDS x6 followed by reapproximation of Scarpa's with running Vicryl.  All incision sites were infiltrated with dilute lyophilized Marcaine.  A closed suction drain had been applied to the lateral most robotic port site under laparoscopic vision.  Prior to cessation of pneumoperitoneum drain, stitch was applied to that and all  incision sites were closed at the level of the skin using subcuticular Monocryl followed by Dermabond. Procedure then terminated.  The patient tolerated the procedure well. There were no immediate periprocedural complications.  The patient was taken to the postanesthesia care in stable condition.  Please note surgical first assistant Sarita Haver was invaluable for providing intraoperative retraction, suture passage, suctionioning, and bedside-assisting for all robotic portions of the procedure without which this  approach would not be possible.   ______________________________ Alexis Frock, MD   ______________________________ Alexis Frock, MD

## 2016-11-03 NOTE — Discharge Summary (Signed)
Physician Discharge Summary  Patient ID: Tasha BHATTACHARYYA MRN: [id number] DOB/AGE: Feb 16, 1951 66 y.o.  Admit date: 10/09/2016 Discharge date: 10/11/2016 Admission Diagnoses: Left renal mass  Discharge Diagnoses:  Active Problems:   Renal mass   Cancer of renal pelvis, left [facility])   Discharged Condition: good  Hospital Course: The patient tolerated the procedure well and was transferred to the floor on IV pain meds, IV fluid. On POD#1 pt was started on clear liquid diet and they ambulated in the halls. On POD#2 the patient was transitioned to a regular diet, IVFs were discontinued, and the patient passed flatus. Prior to discharge the pt was tolerating a regular diet, pain was controlled on PO pain meds, they were ambulating without difficulty, and they had normal bowel function. The patient was discharged with her foley and JP drain  Consults: None  Significant Diagnostic Studies: none  Treatments: surgery: left robotic nephroureterectomy  Discharge Exam: Blood pressure (!) 123/51, pulse 73, temperature 97.7 F (36.5 C), temperature source Oral, resp. rate 16, height 5\' 8"  (1.727 m), weight 102.5 kg (226 lb), SpO2 100 %. General appearance: alert, cooperative and appears stated age Head: Normocephalic, without obvious abnormality, atraumatic Eyes: conjunctivae/corneas clear. PERRL, EOM's intact. Fundi benign. Nose: Nares normal. Septum midline. Mucosa normal. No drainage or sinus tenderness. Resp: clear to auscultation bilaterally Cardio: regular rate and rhythm, S1, S2 normal, no murmur, click, rub or gallop GI: soft, non-tender; bowel sounds normal; no masses,  no organomegaly Extremities: extremities normal, atraumatic, no cyanosis or edema Pulses: 2+ and symmetric  Disposition: 01-Home or Self Care  Discharge Instructions    Discharge patient    Complete by:  As directed    Discharge disposition:  01-Home or Self Care   Discharge patient date:  10/11/2016     Allergies  as of 10/11/2016      Reactions   Adhesive [tape] Rash   PAPER TAPE ONLY   Chlorhexidine Gluconate Itching, Rash   Topical antiseptic (CHG WIPES)    Dilantin [phenytoin] Rash   Penicillins Rash   Has patient had a PCN reaction causing immediate rash, facial/tongue/throat swelling, SOB or lightheadedness with hypotension:unsure Has patient had a PCN reaction causing severe rash involving mucus membranes or skin necrosis:unsure Has patient had a PCN reaction that required hospitalization:No Has patient had a PCN reaction occurring within the last 10 years:No If all of the above answers are "NO", then may proceed with Cephalosporin use.   Sulfa Antibiotics Rash      Medication List    STOP taking these medications   Biotin 1000 MCG tablet   meloxicam 15 MG tablet Commonly known as:  MOBIC     TAKE these medications   cephALEXin 500 MG capsule Commonly known as:  KEFLEX Take 500 mg by mouth 2 (two) times daily. STARTING 3 DAYS BEFORE SURGERY What changed:  Another medication with the same name was removed. Continue taking this medication, and follow the directions you see here.   cetirizine 10 MG tablet Commonly known as:  ZYRTEC Take 10 mg by mouth as needed for allergies.   fenofibrate micronized 130 MG capsule Commonly known as:  ANTARA Take 130 mg by mouth daily with supper.   metFORMIN 1000 MG tablet Commonly known as:  GLUCOPHAGE Take 1,000 mg by mouth 2 (two) times daily.   oxyCODONE-acetaminophen 5-325 MG tablet Commonly known as:  ROXICET Take 1-2 tablets by mouth every 6 (six) hours as needed for moderate pain or severe pain. Post-operatively What changed:  how much to take   pravastatin 40 MG tablet Commonly known as:  PRAVACHOL Take 40 mg by mouth every evening.   quinapril 20 MG tablet Commonly known as:  ACCUPRIL Take 20 mg by mouth daily.   senna-docusate 8.6-50 MG tablet Commonly known as:  Senokot-S Take 1 tablet by mouth 2 (two) times daily.  While taking strong pain meds to prevent constipation.      Follow-up Information    Alexis Frock, MD Follow up on 10/19/2016.   Specialty:  Urology Why:  at 9:30 for MD visit at catheter removal.  Contact information: [place] [id number] [phone]        Larry Ryan Gibson, NP. Call on 10/16/2016.   Specialty:  Nurse Practitioner Why:  possible JP drain removal Contact information: [address] [place]  [address] [phone]           Signed: Nicolette Bang 11/03/2016, 6:45 AM

## 2016-12-11 ENCOUNTER — Other Ambulatory Visit: Payer: Self-pay | Admitting: Internal Medicine

## 2016-12-11 DIAGNOSIS — Z1231 Encounter for screening mammogram for malignant neoplasm of breast: Secondary | ICD-10-CM

## 2017-01-15 ENCOUNTER — Ambulatory Visit
Admission: RE | Admit: 2017-01-15 | Discharge: 2017-01-15 | Disposition: A | Discharge status: Home / Self Care | Payer: Medicare Other | Source: Ambulatory Visit | Attending: Internal Medicine | Admitting: Internal Medicine

## 2017-01-15 DIAGNOSIS — Z1231 Encounter for screening mammogram for malignant neoplasm of breast: Secondary | ICD-10-CM

## 2017-01-20 LAB — LIPID PANEL
Cholesterol: 149 (ref 0–200)
HDL: 46 (ref 35–70)
LDL Cholesterol: 67
Triglycerides: 181 — AB (ref 40–160)

## 2017-01-20 LAB — HEMOGLOBIN A1C: Hemoglobin A1C: 7.7

## 2017-02-26 NOTE — Addendum Note (Signed)
Addendum  created 02/26/17 0850 by Effie Berkshire, MD   Sign clinical note

## 2017-04-16 ENCOUNTER — Other Ambulatory Visit: Payer: Self-pay | Admitting: Urology

## 2017-04-16 ENCOUNTER — Ambulatory Visit (HOSPITAL_COMMUNITY)
Admission: RE | Admit: 2017-04-16 | Discharge: 2017-04-16 | Disposition: A | Discharge status: Home / Self Care | Payer: Medicare Other | Source: Ambulatory Visit | Attending: Urology | Admitting: Urology

## 2017-04-16 DIAGNOSIS — C652 Malignant neoplasm of left renal pelvis: Secondary | ICD-10-CM | POA: Insufficient documentation

## 2017-04-29 ENCOUNTER — Other Ambulatory Visit: Payer: Self-pay | Admitting: Urology

## 2017-05-26 ENCOUNTER — Encounter (HOSPITAL_BASED_OUTPATIENT_CLINIC_OR_DEPARTMENT_OTHER): Payer: Self-pay | Admitting: *Deleted

## 2017-05-26 NOTE — Progress Notes (Signed)
NPO AFTER MN W/ EXCEPTION CLEAR LIQUIDS UNTIL 0830 (NO CREAM/ MILK PRODUCTS).  ARRIVE AT 1315.  NEEDS ISTAT 8.  CURRENT EKG IN CHART AND EPIC.  WILL TAKE COREG AND ZYRTEC AM DOS W/ SIPS OF WATER.  PT STATED WORRIED THAT SHE WILL BE SICK SINCE UNABLE TO EAT AM DOS , TOLD PT ABSOLUTELY NO FOOD JUST CLEAR LIQUIDS UNTIL 0830 AND IF SHE FEELS S&S OF LOW BLOOD SUGAR TO ARRIVE EARLIER. PT WAS OK WITH THAT.

## 2017-06-09 ENCOUNTER — Encounter (HOSPITAL_BASED_OUTPATIENT_CLINIC_OR_DEPARTMENT_OTHER)
Admission: RE | Disposition: A | Discharge status: Home / Self Care | Payer: Self-pay | Source: Ambulatory Visit | Attending: Urology

## 2017-06-09 ENCOUNTER — Ambulatory Visit (HOSPITAL_BASED_OUTPATIENT_CLINIC_OR_DEPARTMENT_OTHER)
Admission: RE | Admit: 2017-06-09 | Discharge: 2017-06-09 | Disposition: A | Discharge status: Home / Self Care | Payer: Medicare Other | Source: Ambulatory Visit | Attending: Urology | Admitting: Urology

## 2017-06-09 ENCOUNTER — Ambulatory Visit (HOSPITAL_BASED_OUTPATIENT_CLINIC_OR_DEPARTMENT_OTHER): Payer: Medicare Other | Admitting: Anesthesiology

## 2017-06-09 ENCOUNTER — Encounter (HOSPITAL_BASED_OUTPATIENT_CLINIC_OR_DEPARTMENT_OTHER): Payer: Self-pay | Admitting: Anesthesiology

## 2017-06-09 DIAGNOSIS — Z6832 Body mass index (BMI) 32.0-32.9, adult: Secondary | ICD-10-CM | POA: Diagnosis not present

## 2017-06-09 DIAGNOSIS — I491 Atrial premature depolarization: Secondary | ICD-10-CM | POA: Diagnosis not present

## 2017-06-09 DIAGNOSIS — E669 Obesity, unspecified: Secondary | ICD-10-CM | POA: Insufficient documentation

## 2017-06-09 DIAGNOSIS — I10 Essential (primary) hypertension: Secondary | ICD-10-CM | POA: Insufficient documentation

## 2017-06-09 DIAGNOSIS — C675 Malignant neoplasm of bladder neck: Secondary | ICD-10-CM | POA: Insufficient documentation

## 2017-06-09 DIAGNOSIS — Z880 Allergy status to penicillin: Secondary | ICD-10-CM | POA: Diagnosis not present

## 2017-06-09 DIAGNOSIS — Z79899 Other long term (current) drug therapy: Secondary | ICD-10-CM | POA: Diagnosis not present

## 2017-06-09 DIAGNOSIS — Z7984 Long term (current) use of oral hypoglycemic drugs: Secondary | ICD-10-CM | POA: Insufficient documentation

## 2017-06-09 DIAGNOSIS — E119 Type 2 diabetes mellitus without complications: Secondary | ICD-10-CM | POA: Insufficient documentation

## 2017-06-09 DIAGNOSIS — E785 Hyperlipidemia, unspecified: Secondary | ICD-10-CM | POA: Diagnosis not present

## 2017-06-09 DIAGNOSIS — Z8553 Personal history of malignant neoplasm of renal pelvis: Secondary | ICD-10-CM | POA: Diagnosis not present

## 2017-06-09 HISTORY — DX: Personal history of other diseases of the circulatory system: Z98.890

## 2017-06-09 HISTORY — DX: Trigger finger, unspecified finger: M65.30

## 2017-06-09 HISTORY — DX: Hyperlipidemia, unspecified: E78.5

## 2017-06-09 HISTORY — PX: TRANSURETHRAL RESECTION OF BLADDER TUMOR WITH MITOMYCIN-C: SHX6459

## 2017-06-09 HISTORY — DX: Type 2 diabetes mellitus without complications: E11.9

## 2017-06-09 HISTORY — DX: Presence of spectacles and contact lenses: Z97.3

## 2017-06-09 HISTORY — DX: Atrial premature depolarization: I49.1

## 2017-06-09 HISTORY — DX: Personal history of malignant neoplasm of renal pelvis: Z85.53

## 2017-06-09 HISTORY — DX: Personal history of other diseases of the circulatory system: Z86.79

## 2017-06-09 HISTORY — DX: Unspecified osteoarthritis, unspecified site: M19.90

## 2017-06-09 HISTORY — DX: Malignant neoplasm of bladder, unspecified: C67.9

## 2017-06-09 HISTORY — DX: Hemangioma of intra-abdominal structures: D18.03

## 2017-06-09 HISTORY — PX: CYSTOSCOPY W/ RETROGRADES: SHX1426

## 2017-06-09 LAB — POCT I-STAT, CHEM 8
BUN: 48 mg/dL — AB (ref 6–20)
CHLORIDE: 105 mmol/L (ref 101–111)
CREATININE: 1.5 mg/dL — AB (ref 0.44–1.00)
Calcium, Ion: 1.28 mmol/L (ref 1.15–1.40)
GLUCOSE: 117 mg/dL — AB (ref 65–99)
HCT: 41 % (ref 36.0–46.0)
Hemoglobin: 13.9 g/dL (ref 12.0–15.0)
POTASSIUM: 5.4 mmol/L — AB (ref 3.5–5.1)
Sodium: 140 mmol/L (ref 135–145)
TCO2: 28 mmol/L (ref 22–32)

## 2017-06-09 LAB — GLUCOSE, CAPILLARY: GLUCOSE-CAPILLARY: 88 mg/dL (ref 65–99)

## 2017-06-09 SURGERY — TRANSURETHRAL RESECTION OF BLADDER TUMOR WITH MITOMYCIN-C
Anesthesia: General | Site: Bladder | Laterality: Right

## 2017-06-09 MED ORDER — SENNOSIDES-DOCUSATE SODIUM 8.6-50 MG PO TABS: 1.0000 | ORAL_TABLET | Freq: Two times a day (BID) | ORAL | 0 refills | Status: DC

## 2017-06-09 MED ORDER — METOCLOPRAMIDE HCL 5 MG/ML IJ SOLN
INTRAMUSCULAR | Status: DC | PRN
  Administered 2017-06-09: 5 mg via INTRAVENOUS

## 2017-06-09 MED ORDER — TRAMADOL HCL 50 MG PO TABS: 50.0000 mg | ORAL_TABLET | Freq: Four times a day (QID) | ORAL | 0 refills | Status: DC | PRN

## 2017-06-09 MED ORDER — TRAMADOL HCL 50 MG PO TABS
ORAL_TABLET | ORAL | Status: AC
  Filled 2017-06-09: qty 1

## 2017-06-09 MED ORDER — MIDAZOLAM HCL 5 MG/5ML IJ SOLN
INTRAMUSCULAR | Status: DC | PRN
  Administered 2017-06-09: 2 mg via INTRAVENOUS

## 2017-06-09 MED ORDER — HYDROCODONE-ACETAMINOPHEN 7.5-325 MG PO TABS
1.0000 | ORAL_TABLET | Freq: Once | ORAL | Status: DC | PRN
  Filled 2017-06-09: qty 1

## 2017-06-09 MED ORDER — MEPERIDINE HCL 25 MG/ML IJ SOLN
6.2500 mg | INTRAMUSCULAR | Status: DC | PRN
  Filled 2017-06-09: qty 1

## 2017-06-09 MED ORDER — GENTAMICIN SULFATE 40 MG/ML IJ SOLN
5.0000 mg/kg | INTRAVENOUS | Status: AC
  Administered 2017-06-09: 390 mg via INTRAVENOUS
  Filled 2017-06-09: qty 9.75

## 2017-06-09 MED ORDER — PROPOFOL 10 MG/ML IV BOLUS
INTRAVENOUS | Status: DC | PRN
  Administered 2017-06-09: 30 mg via INTRAVENOUS
  Administered 2017-06-09: 170 mg via INTRAVENOUS

## 2017-06-09 MED ORDER — DEXAMETHASONE SODIUM PHOSPHATE 10 MG/ML IJ SOLN
INTRAMUSCULAR | Status: AC
  Filled 2017-06-09: qty 1

## 2017-06-09 MED ORDER — ONDANSETRON HCL 4 MG/2ML IJ SOLN
INTRAMUSCULAR | Status: AC
  Filled 2017-06-09: qty 2

## 2017-06-09 MED ORDER — MITOMYCIN CHEMO FOR BLADDER INSTILLATION 40 MG
80.0000 mg | Freq: Once | INTRAVENOUS | Status: AC
  Administered 2017-06-09: 80 mg via INTRAVESICAL
  Filled 2017-06-09 (×2): qty 80

## 2017-06-09 MED ORDER — PROPOFOL 10 MG/ML IV BOLUS
INTRAVENOUS | Status: AC
  Filled 2017-06-09: qty 20

## 2017-06-09 MED ORDER — DEXAMETHASONE SODIUM PHOSPHATE 4 MG/ML IJ SOLN
INTRAMUSCULAR | Status: DC | PRN
  Administered 2017-06-09: 10 mg via INTRAVENOUS

## 2017-06-09 MED ORDER — FENTANYL CITRATE (PF) 100 MCG/2ML IJ SOLN
INTRAMUSCULAR | Status: DC | PRN
  Administered 2017-06-09 (×2): 50 ug via INTRAVENOUS

## 2017-06-09 MED ORDER — FENTANYL CITRATE (PF) 100 MCG/2ML IJ SOLN
INTRAMUSCULAR | Status: AC
  Filled 2017-06-09: qty 2

## 2017-06-09 MED ORDER — FENTANYL CITRATE (PF) 100 MCG/2ML IJ SOLN
25.0000 ug | INTRAMUSCULAR | Status: DC | PRN
  Filled 2017-06-09: qty 1

## 2017-06-09 MED ORDER — MIDAZOLAM HCL 2 MG/2ML IJ SOLN
INTRAMUSCULAR | Status: AC
  Filled 2017-06-09: qty 2

## 2017-06-09 MED ORDER — ONDANSETRON HCL 4 MG/2ML IJ SOLN
INTRAMUSCULAR | Status: DC | PRN
  Administered 2017-06-09: 4 mg via INTRAVENOUS

## 2017-06-09 MED ORDER — IOHEXOL 300 MG/ML  SOLN
INTRAMUSCULAR | Status: DC | PRN
  Administered 2017-06-09: 8 mL via URETHRAL

## 2017-06-09 MED ORDER — METOCLOPRAMIDE HCL 5 MG/ML IJ SOLN
10.0000 mg | Freq: Once | INTRAMUSCULAR | Status: DC | PRN
  Filled 2017-06-09: qty 2

## 2017-06-09 MED ORDER — METOCLOPRAMIDE HCL 5 MG/ML IJ SOLN
INTRAMUSCULAR | Status: AC
  Filled 2017-06-09: qty 2

## 2017-06-09 MED ORDER — LACTATED RINGERS IV SOLN
INTRAVENOUS | Status: DC
  Administered 2017-06-09 (×2): via INTRAVENOUS
  Filled 2017-06-09: qty 1000

## 2017-06-09 MED ORDER — LIDOCAINE 2% (20 MG/ML) 5 ML SYRINGE
INTRAMUSCULAR | Status: DC | PRN
  Administered 2017-06-09: 80 mg via INTRAVENOUS

## 2017-06-09 MED ORDER — TRAMADOL HCL 50 MG PO TABS
50.0000 mg | ORAL_TABLET | Freq: Four times a day (QID) | ORAL | Status: DC
  Administered 2017-06-09: 50 mg via ORAL
  Filled 2017-06-09: qty 1

## 2017-06-09 MED ORDER — LIDOCAINE 2% (20 MG/ML) 5 ML SYRINGE
INTRAMUSCULAR | Status: AC
  Filled 2017-06-09: qty 5

## 2017-06-09 MED FILL — traMADol HCL 50 MG TABS: 50 | 3 days supply | Qty: 20 | Fill #0

## 2017-06-09 SURGICAL SUPPLY — 39 items
BAG DRAIN URO-CYSTO SKYTR STRL (DRAIN) ×4 IMPLANT
BAG DRN ANRFLXCHMBR STRAP LEK (BAG)
BAG DRN UROCATH (DRAIN) ×2
BAG URINE DRAINAGE (UROLOGICAL SUPPLIES) ×2 IMPLANT
BAG URINE LEG 19OZ MD ST LTX (BAG) IMPLANT
BAG URINE LEG 500ML (DRAIN) IMPLANT
BASKET LASER NITINOL 1.9FR (BASKET) IMPLANT
BSKT STON RTRVL 120 1.9FR (BASKET)
CATH FOLEY 2WAY SLVR  5CC 18FR (CATHETERS) ×2
CATH FOLEY 2WAY SLVR  5CC 22FR (CATHETERS)
CATH FOLEY 2WAY SLVR 30CC 20FR (CATHETERS) IMPLANT
CATH FOLEY 2WAY SLVR 5CC 18FR (CATHETERS) IMPLANT
CATH FOLEY 2WAY SLVR 5CC 22FR (CATHETERS) IMPLANT
CATH INTERMIT  6FR 70CM (CATHETERS) ×2 IMPLANT
CLOTH BEACON ORANGE TIMEOUT ST (SAFETY) ×4 IMPLANT
ELECT REM PT RETURN 9FT ADLT (ELECTROSURGICAL)
ELECTRODE REM PT RTRN 9FT ADLT (ELECTROSURGICAL) ×2 IMPLANT
EVACUATOR MICROVAS BLADDER (UROLOGICAL SUPPLIES) IMPLANT
FIBER LASER FLEXIVA 365 (UROLOGICAL SUPPLIES) IMPLANT
FIBER LASER TRAC TIP (UROLOGICAL SUPPLIES) IMPLANT
GLOVE BIO SURGEON STRL SZ7.5 (GLOVE) ×4 IMPLANT
GOWN STRL REUS W/ TWL LRG LVL3 (GOWN DISPOSABLE) ×2 IMPLANT
GOWN STRL REUS W/TWL LRG LVL3 (GOWN DISPOSABLE) ×8 IMPLANT
GUIDEWIRE ANG ZIPWIRE 038X150 (WIRE) ×2 IMPLANT
GUIDEWIRE STR DUAL SENSOR (WIRE) ×2 IMPLANT
INFUSOR MANOMETER BAG 3000ML (MISCELLANEOUS) ×2 IMPLANT
IV NS 1000ML (IV SOLUTION)
IV NS 1000ML BAXH (IV SOLUTION) ×2 IMPLANT
IV NS IRRIG 3000ML ARTHROMATIC (IV SOLUTION) ×6 IMPLANT
KIT RM TURNOVER CYSTO AR (KITS) ×4 IMPLANT
LOOP CUT BIPOLAR 24F LRG (ELECTROSURGICAL) ×2 IMPLANT
MANIFOLD NEPTUNE II (INSTRUMENTS) ×4 IMPLANT
NS IRRIG 500ML POUR BTL (IV SOLUTION) ×4 IMPLANT
PACK CYSTO (CUSTOM PROCEDURE TRAY) ×4 IMPLANT
SYRINGE 10CC LL (SYRINGE) ×4 IMPLANT
SYRINGE IRR TOOMEY STRL 70CC (SYRINGE) IMPLANT
TUBE CONNECTING 12'X1/4 (SUCTIONS)
TUBE CONNECTING 12X1/4 (SUCTIONS) IMPLANT
TUBE FEEDING 8FR 16IN STR KANG (MISCELLANEOUS) IMPLANT

## 2017-06-09 NOTE — Brief Op Note (Signed)
06/09/2017  4:42 PM  PATIENT:  Tasha Tucker  66 y.o. female  PRE-OPERATIVE DIAGNOSIS:  BLADDER CANCER  POST-OPERATIVE DIAGNOSIS:  BLADDER CANCER  PROCEDURE:  Procedure(s): TRANSURETHRAL RESECTION OF BLADDER TUMOR WITH MITOMYCIN-C (N/A) CYSTOSCOPY WITH RETROGRADE PYELOGRAM (Right)  SURGEON:  Surgeon(s) and Role:    * Alexis Frock, MD - Primary  PHYSICIAN ASSISTANT:   ASSISTANTS: none   ANESTHESIA:   general  EBL:  Total I/O In: 700 [I.V.:700] Out: -   BLOOD ADMINISTERED:none  DRAINS: 66F foley to gravity   LOCAL MEDICATIONS USED:  NONE  SPECIMEN:  Source of Specimen:  bladder tumor, base of bladder tumor, bladder erythema  DISPOSITION OF SPECIMEN:  PATHOLOGY  COUNTS:  YES  TOURNIQUET:  * No tourniquets in log *  DICTATION: .Other Dictation: Dictation Number [id number]  PLAN OF CARE: Discharge to home after PACU  PATIENT DISPOSITION:  PACU - hemodynamically stable.   Delay start of Pharmacological VTE agent (>24hrs) due to surgical blood loss or risk of bleeding: yes

## 2017-06-09 NOTE — Anesthesia Procedure Notes (Signed)
Procedure Name: LMA Insertion Date/Time: 06/09/2017 4:21 PM Performed by: Lyndle Herrlich Pre-anesthesia Checklist: Patient identified, Emergency Drugs available, Suction available and Patient being monitored Patient Re-evaluated:Patient Re-evaluated prior to induction Oxygen Delivery Method: Circle system utilized Preoxygenation: Pre-oxygenation with 100% oxygen Induction Type: IV induction Ventilation: Mask ventilation without difficulty LMA: LMA inserted LMA Size: 4.0 Number of attempts: 1 Airway Equipment and Method: Bite block Placement Confirmation: positive ETCO2 Tube secured with: Tape Dental Injury: Teeth and Oropharynx as per pre-operative assessment

## 2017-06-09 NOTE — Anesthesia Postprocedure Evaluation (Signed)
Anesthesia Post Note  Patient: Tasha Tucker  Procedure(s) Performed: Procedure(s) (LRB): TRANSURETHRAL RESECTION OF BLADDER TUMOR WITH MITOMYCIN-C (N/A) CYSTOSCOPY WITH RETROGRADE PYELOGRAM (Right)     Patient location during evaluation: PACU Anesthesia Type: General Level of consciousness: sedated and patient cooperative Pain management: pain level controlled Vital Signs Assessment: post-procedure vital signs reviewed and stable Respiratory status: spontaneous breathing Cardiovascular status: stable Anesthetic complications: no    Last Vitals:  Vitals:   06/09/17 1800 06/09/17 1815  BP: (!) 191/58 (!) 172/80  Pulse: 71 74  Resp: 14 15  Temp:    SpO2: 99% 97%    Last Pain:  Vitals:   06/09/17 1252  TempSrc: Oral                 Nolon Nations

## 2017-06-09 NOTE — Transfer of Care (Signed)
  Last Vitals:  Vitals:   06/09/17 1252 06/09/17 1654  BP: (!) 172/65 (!) (P) 153/64  Pulse: 61   Resp: 16   Temp: 36.5 C   SpO2: 100%     Last Pain:  Vitals:   06/09/17 1252  TempSrc: Oral      Patients Stated Pain Goal: 5 (06/09/17 1315) Immediate Anesthesia Transfer of Care Note  Patient: MYKIAH SCHMUCK  Procedure(s) Performed: Procedure(s) (LRB): TRANSURETHRAL RESECTION OF BLADDER TUMOR WITH MITOMYCIN-C (N/A) CYSTOSCOPY WITH RETROGRADE PYELOGRAM (Right)  Patient Location: PACU  Anesthesia Type: General  Level of Consciousness: awake, alert  and oriented  Airway & Oxygen Therapy: Patient Spontanous Breathing and Patient connected to nasal cannula oxygen  Post-op Assessment: Report given to PACU RN and Post -op Vital signs reviewed and stable  Post vital signs: Reviewed and stable  Complications: No apparent anesthesia complications

## 2017-06-09 NOTE — H&P (Signed)
KEYSHA Tucker is an 66 y.o. female.    Chief Complaint: Pre-op Transurethral Resection of Bladder Tumor  HPI:   1 - Left Renal Pelvis Cancer / Bladder Cancer - s/p LEFT robotic nephro-ureterectomy and peri-aortic nod dissection 09/2016 for pT1N0Mx high grade urothelial carcinoma with NEGATIVE margins. Prior chronic actos use.   Post-op Surveillance:  04/2017 - CT, CMP, CXR, Cysto - Cr 1.2, 2cm dome, 2cm left wall (close to prior UO) recurrence.    PMH sig for obesity, lap hyst (benign), BTL, lap chole, DM2 (no neuropathy). Her PCP is Steva Colder MD.   Today "Tasha Tucker" is seen to proceed with TURBT / Right retrograde for bladder cancer that is clinically localized. Had recent enterococcus bacteruria treated with Cipro according to CX. NO interval fevers.    Past Medical History:  Diagnosis Date  . Bladder cancer ([place])    [place]  . DJD (degenerative joint disease)    NECK, NEEDS PILLOW WHEN LYING FLAT IS POSSIBLE  . History of cerebral aneurysm repair    1996-- S/P CLAPPING RIGHT CAROTID --- NO DEFICITS  . 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 Hyperlipidemia   . Hypertension   . Liver hemangioma   . OA (osteoarthritis)   . PAC (premature atrial contraction)   . PONV (postoperative nausea and vomiting)   . Right trigger finger   . Type 2 diabetes mellitus ([place])   . Wears glasses     Past Surgical History:  Procedure Laterality Date  . ABDOMINAL HYSTERECTOMY  1990   PARTIAL  . CATARACT EXTRACTION W/ INTRAOCULAR LENS  IMPLANT, BILATERAL  2016; 2017  . CEREBRAL ANEURYSM REPAIR  1996   clipped-no deficits RIGHT CAROTID  . CHOLECYSTECTOMY  2010  . CYSTOSCOPY WITH RETROGRADE PYELOGRAM, URETEROSCOPY AND STENT PLACEMENT Left 08/26/2016   Procedure: CYSTOSCOPY WITH BILATERAL RETROGRADE PYELOGRAM,LEFT URETEROSCOPY WITH BIOPSY AND LEFT STENT PLACEMENT;  Surgeon: Alexis Frock,  MD;  Location: [facility];  Service: Urology;  Laterality: Left;  . ROBOT ASSITED LAPAROSCOPIC NEPHROURETERECTOMY Left 10/09/2016   Procedure: XI ROBOT ASSITED LAPAROSCOPIC NEPHROURETERECTOMY;  Surgeon: Alexis Frock, MD;  Location: [facility];  Service: Urology;  Laterality: Left;  . SURGERY ON FOOT FOR STAPH INFECTION  1989  . TUBAL LIGATION  1980    Family History  Problem Relation Age of Onset  . Breast cancer Neg Hx    Social History:  reports that she has never smoked. She has never used smokeless tobacco. She reports that she drinks alcohol. She reports that she does not use drugs.  Allergies:  Allergies  Allergen Reactions  . Adhesive [Tape] Rash    PAPER TAPE ONLY  . Chlorhexidine Gluconate Itching and Rash    Topical antiseptic (CHG WIPES)   . Dilantin [Phenytoin] Rash  . Penicillins Rash    Has patient had a PCN reaction causing immediate rash, facial/tongue/throat swelling, SOB or lightheadedness with hypotension:unsure Has patient had a PCN reaction causing severe rash involving mucus membranes or skin necrosis:unsure Has patient had a PCN reaction that required hospitalization:No Has patient had a PCN reaction occurring within the last 10 years:No If all of the above answers are "NO", then may proceed with Cephalosporin use.   . Sulfa Antibiotics Rash    No prescriptions prior to admission.    No results found for this or any previous visit (from the past 48 hour(s)). No results found.  Review of Systems  Constitutional: Negative.  Negative for chills and fever.  HENT: Negative.   Eyes: Negative.   Respiratory: Negative.   Cardiovascular: Negative.   Gastrointestinal: Negative.   Genitourinary: Negative.  Negative for hematuria.  Musculoskeletal: Negative.   Skin: Negative.   Neurological: Negative.   Endo/Heme/Allergies: Negative.   Psychiatric/Behavioral: Negative.     Height 5\' 8"  (1.727 m), weight 95.3 kg (210 lb). Physical Exam   Constitutional: She appears well-developed.  HENT:  Head: Normocephalic.  Eyes: Pupils are equal, round, and reactive to light.  Neck: Normal range of motion.  Cardiovascular: Normal rate.   Respiratory: Effort normal.  GI: Soft.  Prior scars w/o hernias.   Genitourinary:  Genitourinary Comments: No CVAT  Musculoskeletal: Normal range of motion.  Neurological: She is alert.  Skin: Skin is warm.  Psychiatric: She has a normal mood and affect.     Assessment/Plan  Proceed with TURBT, Rt retrograde, Mito-C instillation today as planned. Risks, benefits, alternatives expected peri-op course discussed previously and reiterated today.   Alexis Frock, MD 06/09/2017, 7:27 AM

## 2017-06-09 NOTE — Interval H&P Note (Signed)
History and Physical Interval Note:  06/09/2017 4:08 PM  Tasha Tucker  has presented today for surgery, with the diagnosis of BLADDER CANCER  The various methods of treatment have been discussed with the patient and family. After consideration of risks, benefits and other options for treatment, the patient has consented to  Procedure(s): TRANSURETHRAL RESECTION OF BLADDER TUMOR WITH MITOMYCIN-C (N/A) CYSTOSCOPY WITH RETROGRADE PYELOGRAM (Right) as a surgical intervention .  The patient's history has been reviewed, patient examined, no change in status, stable for surgery.  I have reviewed the patient's chart and labs.  Questions were answered to the patient's satisfaction.     Dimarco Minkin

## 2017-06-09 NOTE — Discharge Instructions (Signed)
1 - You may have urinary urgency (bladder spasms) and bloody urine on / off for up to 2 weeks. This is normal.  2 - Call MD or go to ER for fever >102, severe pain / nausea / vomiting not relieved by medications, or acute change in medical status  CYSTOSCOPY HOME CARE INSTRUCTIONS  Activity: Rest for the remainder of the day.  Do not drive or operate equipment today.  You may resume normal activities in one to two days as instructed by your physician.   Meals: Drink plenty of liquids and eat light foods such as gelatin or soup this evening.  You may return to a normal meal plan tomorrow.  Return to Work: You may return to work in one to two days or as instructed by your physician.  Special Instructions / Symptoms: Call your physician if any of these symptoms occur:   -persistent or heavy bleeding  -large blood clots that are difficult to pass  -urine stream diminishes or stops completely  -fever equal to or higher than 101 degrees Farenheit.  -cloudy urine with a strong, foul odor  -severe pain  Females should always wipe from front to back after elimination.  You may feel some burning pain when you urinate.  This should disappear with time.  Applying moist heat to the lower abdomen or a hot tub bath may help relieve the pain. \  Post Anesthesia Home Care Instructions  Activity: Get plenty of rest for the remainder of the day. A responsible individual must stay with you for 24 hours following the procedure.  For the next 24 hours, DO NOT: -Drive a car -Paediatric nurse -Drink alcoholic beverages -Take any medication unless instructed by your physician -Make any legal decisions or sign important papers.  Meals: Start with liquid foods such as gelatin or soup. Progress to regular foods as tolerated. Avoid greasy, spicy, heavy foods. If nausea and/or vomiting occur, drink only clear liquids until the nausea and/or vomiting subsides. Call your physician if vomiting  continues.  Special Instructions/Symptoms: Your throat may feel dry or sore from the anesthesia or the breathing tube placed in your throat during surgery. If this causes discomfort, gargle with warm salt water. The discomfort should disappear within 24 hours.  If you had a scopolamine patch placed behind your ear for the management of post- operative nausea and/or vomiting:  1. The medication in the patch is effective for 72 hours, after which it should be removed.  Wrap patch in a tissue and discard in the trash. Wash hands thoroughly with soap and water. 2. You may remove the patch earlier than 72 hours if you experience unpleasant side effects which may include dry mouth, dizziness or visual disturbances. 3. Avoid touching the patch. Wash your hands with soap and water after contact with the patch.

## 2017-06-09 NOTE — Anesthesia Preprocedure Evaluation (Addendum)
Anesthesia Evaluation  Patient identified by MRN, date of birth, ID band Patient awake    Reviewed: Allergy & Precautions, NPO status , Patient's Chart, lab work & pertinent test results, reviewed documented beta blocker date and time   History of Anesthesia Complications (+) PONV and history of anesthetic complications  Airway Mallampati: II  TM Distance: >3 FB Neck ROM: Full    Dental no notable dental hx. (+) Teeth Intact, Dental Advisory Given   Pulmonary neg pulmonary ROS,    Pulmonary exam normal breath sounds clear to auscultation       Cardiovascular hypertension, Pt. on medications and Pt. on home beta blockers Normal cardiovascular exam Rhythm:Regular Rate:Normal     Neuro/Psych negative neurological ROS  negative psych ROS   GI/Hepatic negative GI ROS, Neg liver ROS,   Endo/Other  diabetes, Well Controlled, Type 2, Oral Hypoglycemic AgentsObesity  Hyperlipidemia   Renal/GU Renal InsufficiencyRenal diseaseHx/o left renal carcinoma S/P left nephroureterectomy   Bladder Ca    Musculoskeletal  (+) Arthritis , Osteoarthritis,    Abdominal (+) + obese,   Peds  Hematology negative hematology ROS (+) anemia ,   Anesthesia Other Findings   Reproductive/Obstetrics                            Anesthesia Physical Anesthesia Plan  ASA: II  Anesthesia Plan: General   Post-op Pain Management:    Induction: Intravenous  PONV Risk Score and Plan: 4 or greater and Ondansetron, Dexamethasone, Midazolam, Propofol infusion, Scopolamine patch - Pre-op and Metaclopromide  Airway Management Planned: LMA  Additional Equipment:   Intra-op Plan:   Post-operative Plan: Extubation in OR  Informed Consent: I have reviewed the patients History and Physical, chart, labs and discussed the procedure including the risks, benefits and alternatives for the proposed anesthesia with the patient or  authorized representative who has indicated his/her understanding and acceptance.   Dental advisory given  Plan Discussed with: CRNA, Anesthesiologist and Surgeon  Anesthesia Plan Comments:         Anesthesia Quick Evaluation

## 2017-06-10 ENCOUNTER — Encounter (HOSPITAL_BASED_OUTPATIENT_CLINIC_OR_DEPARTMENT_OTHER): Payer: Self-pay | Admitting: Urology

## 2017-06-10 NOTE — Op Note (Signed)
NAME:  Tasha Tucker, Tasha Tucker               ACCOUNT NO.:  [id number]  MEDICAL RECORD NO.:  [id number]  LOCATION:                                 FACILITY:  PHYSICIAN:  Alexis Frock, MD          DATE OF BIRTH:  DATE OF PROCEDURE:  06/09/2017                              OPERATIVE REPORT   DIAGNOSIS:  Large volume bladder tumor.  PROCEDURES: 1. Transurethral resection of bladder tumor, volume large. 2. Right retrograde pyelogram and interpretation. 3. Instillation of chemotherapeutic mitomycin-C.  ESTIMATED BLOOD LOSS:  Nil.  COMPLICATIONS:  None.  SPECIMENS: 1. Bladder tumor. 2. Base of bladder tumor. 3. Bladder erythema all for permanent pathology.  FINDINGS: 1. Approximately 6 cm of papillary bladder tumor, 5 cm focus at     approximately 12 o'clock bladder neck, 1 cm focus of left trigone     near area of prior ureteral orifice. 2. Unremarkable right retrograde pyelogram. 3. Scattered bladder erythema mostly posterior wall worrisome for     possible carcinoma in situ.  DRAIN:  Foley catheter to straight drain.  INDICATIONS:  Tasha Tucker is a very pleasant 66 year old lady with history of left renal pelvis urothelial carcinoma.  She is status post left nephroureterectomy for this and initially fairly well postoperatively.  Unfortunately, she was found non-surveillance of her upper tract urothelial carcinoma to have a bladder tumor on most recent cystoscopy and imaging survey.  This did appear to be clinically localized.  It was clearly felt that transurethral resection was warranted for diagnosis and staging purposes.  Informed consent was obtained and placed in the medical record.  She has been recently treated for bacteriuria according to cultures and has cleared infectious parameters.  PROCEDURE IN DETAIL:  The patient being Tasha Tucker, was verified. Procedure being cysto, right retrograde pyelogram, transurethral resection of bladder tumor, instillation of  mitomycin was confirmed. Procedure was carried out.  Time-out was performed.  Intravenous antibiotics were administered.  General LMA anesthesia introduced.  The patient was placed into a low lithotomy position.  Sterile field was created by prepping and draping the patient's vagina, introitus, and proximal thighs using iodine.  Next, cystourethroscopy was performed using a 22-French rigid cystoscope with offset lens.  Inspection of the urinary bladder revealed large-volume papillary tumor at 12 o'clock position approximately 5 cm.  This was separate focus smaller about 1 cm near the area of prior left ureteral orifice.  There was some diffuse bladder erythema mostly centered in the posterior wall, this was somewhat concerning for carcinoma in situ.  The right ureteral orifice was unremarkable.  Attention was then directed to the right retrograde pyelogram.  The right ureteral orifice was cannulated with a 6-French end-hole catheter and right retrograde pyelogram was obtained.  Right retrograde pyelogram demonstrated a single right ureter with single-system right kidney.  No filling defects or narrowing seen whatsoever.  The cystoscope was then exchanged for 26-French resectoscope sheath with visual obturator, and using a medium-sized resectoscope loop, very careful resection was performed first with no energy and then with energy of the small left bladder neck tumor.  The base of this was very carefully fulgurated.  Next, the  dome tumor similarly resected with first a no energy technique followed by a very judicious energy technique given anterior tumor.  Taken exquisite care to avoid bladder perforation, which did not occur.  These larger tumor fragments were set aside and labeled as bladder tumor.  Next, cold cup biopsy forceps were used to obtain representative seromuscular deep sections of each of the site, this was set aside, labeled as base of bladder tumor, and cold cup biopsy  forceps were used to obtain representative samples of the bladder erythema from the posterior wall. All of these biopsy sites and bladder tumor sites were then fulgurated with additional coagulation current, this resulted in excellent hemostasis.  There was no evidence of bladder perforation.  As such, an 18-French Foley catheter was placed per urethra to straight drain.  The patient was awakened to the postanesthesia care unit.  In the postanesthesia care unit, 80 mg of mitomycin and 80 mL of fluid were instilled via the catheter, held for an hour and then allowed to drain. Procedure was then terminated.  The patient tolerated the procedure well.  There were no immediate periprocedural complications.    ______________________________ Alexis Frock, MD   ______________________________ Alexis Frock, MD    TM/MEDQ  D:  06/09/2017  T:  06/10/2017  Job:  [id number]

## 2017-06-22 ENCOUNTER — Encounter: Payer: Self-pay | Admitting: *Deleted

## 2017-06-22 NOTE — Congregational Nurse Program (Signed)
Congregational Nurse Program Note  Date of Encounter: 06/22/2017  Past Medical History: Past Medical History:  Diagnosis Date  . Bladder cancer ([place])    [place]  . DJD (degenerative joint disease)    NECK, NEEDS PILLOW WHEN LYING FLAT IS POSSIBLE  . History of cerebral aneurysm repair    1996-- S/P CLAPPING RIGHT CAROTID --- NO DEFICITS  . 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 Hyperlipidemia   . Hypertension   . Liver hemangioma   . OA (osteoarthritis)   . PAC (premature atrial contraction)   . PONV (postoperative nausea and vomiting)   . Right trigger finger   . Type 2 diabetes mellitus ([place])   . Wears glasses     Encounter Details:     CNP Questionnaire - 06/22/17 1440      Patient Demographics   Is this a new or existing patient? New   Patient is considered a/an Not Applicable   Race Caucasian/White     Patient Assistance   Location of Patient Assistance Not Applicable   Patient's financial/insurance status Medicare   Uninsured Patient ([place] [employer]s) No   Patient referred to apply for the following financial assistance Not Applicable   Food insecurities addressed Not Applicable   Transportation assistance No   Assistance securing medications No   Educational health offerings Medications     Encounter Details   Primary purpose of visit [place]   Was an Emergency Department visit averted? Not Applicable   Does patient have a medical provider? Yes   Patient referred to Not Applicable   Was a mental health screening completed? (GAINS tool) No   Does patient have dental issues? No   Does patient have vision issues? No   Does your patient have an abnormal blood pressure today? No   Since previous encounter, have you referred patient for abnormal blood pressure that resulted in a new diagnosis or medication change? No   Does  your patient have an abnormal blood glucose today? No   Since previous encounter, have you referred patient for abnormal blood glucose that resulted in a new diagnosis or medication change? No   Was there a life-saving intervention made? No

## 2017-06-25 ENCOUNTER — Other Ambulatory Visit: Payer: Self-pay | Admitting: Urology

## 2017-07-13 ENCOUNTER — Encounter (HOSPITAL_BASED_OUTPATIENT_CLINIC_OR_DEPARTMENT_OTHER): Payer: Self-pay | Admitting: *Deleted

## 2017-07-13 NOTE — Progress Notes (Signed)
NPO AFTER MN W/ EXCEPTION CLEAR LIQUIDS UNTIL 0630.  ARRIVE AT 1045.  NEEDS ISTAT 8.  CURRENT EKG IN CHART AND EPIC.  WILL TAKE COREG AND ZYRTEC AM DOS W/ SIPS OF WATER.

## 2017-07-20 MED ORDER — GENTAMICIN SULFATE 40 MG/ML IJ SOLN
5.0000 mg/kg | INTRAMUSCULAR | Status: DC
  Filled 2017-07-20 (×2): qty 9.5

## 2017-07-21 ENCOUNTER — Ambulatory Visit (HOSPITAL_BASED_OUTPATIENT_CLINIC_OR_DEPARTMENT_OTHER): Payer: Medicare Other | Admitting: Anesthesiology

## 2017-07-21 ENCOUNTER — Encounter (HOSPITAL_BASED_OUTPATIENT_CLINIC_OR_DEPARTMENT_OTHER): Payer: Self-pay

## 2017-07-21 ENCOUNTER — Ambulatory Visit (HOSPITAL_BASED_OUTPATIENT_CLINIC_OR_DEPARTMENT_OTHER)
Admission: RE | Admit: 2017-07-21 | Discharge: 2017-07-21 | Disposition: A | Discharge status: Home / Self Care | Payer: Medicare Other | Source: Ambulatory Visit | Attending: Urology | Admitting: Urology

## 2017-07-21 ENCOUNTER — Encounter (HOSPITAL_BASED_OUTPATIENT_CLINIC_OR_DEPARTMENT_OTHER)
Admission: RE | Disposition: A | Discharge status: Home / Self Care | Payer: Self-pay | Source: Ambulatory Visit | Attending: Urology

## 2017-07-21 DIAGNOSIS — Z79899 Other long term (current) drug therapy: Secondary | ICD-10-CM | POA: Insufficient documentation

## 2017-07-21 DIAGNOSIS — E119 Type 2 diabetes mellitus without complications: Secondary | ICD-10-CM | POA: Insufficient documentation

## 2017-07-21 DIAGNOSIS — E785 Hyperlipidemia, unspecified: Secondary | ICD-10-CM | POA: Diagnosis not present

## 2017-07-21 DIAGNOSIS — E669 Obesity, unspecified: Secondary | ICD-10-CM | POA: Diagnosis not present

## 2017-07-21 DIAGNOSIS — Z882 Allergy status to sulfonamides status: Secondary | ICD-10-CM | POA: Insufficient documentation

## 2017-07-21 DIAGNOSIS — N3020 Other chronic cystitis without hematuria: Secondary | ICD-10-CM | POA: Diagnosis not present

## 2017-07-21 DIAGNOSIS — Z905 Acquired absence of kidney: Secondary | ICD-10-CM | POA: Insufficient documentation

## 2017-07-21 DIAGNOSIS — N3000 Acute cystitis without hematuria: Secondary | ICD-10-CM | POA: Insufficient documentation

## 2017-07-21 DIAGNOSIS — C679 Malignant neoplasm of bladder, unspecified: Secondary | ICD-10-CM | POA: Insufficient documentation

## 2017-07-21 DIAGNOSIS — Z791 Long term (current) use of non-steroidal anti-inflammatories (NSAID): Secondary | ICD-10-CM | POA: Insufficient documentation

## 2017-07-21 DIAGNOSIS — M1990 Unspecified osteoarthritis, unspecified site: Secondary | ICD-10-CM | POA: Diagnosis not present

## 2017-07-21 DIAGNOSIS — I10 Essential (primary) hypertension: Secondary | ICD-10-CM | POA: Diagnosis not present

## 2017-07-21 DIAGNOSIS — Z880 Allergy status to penicillin: Secondary | ICD-10-CM | POA: Diagnosis not present

## 2017-07-21 HISTORY — PX: TRANSURETHRAL RESECTION OF BLADDER TUMOR: SHX2575

## 2017-07-21 HISTORY — PX: CYSTOSCOPY W/ RETROGRADES: SHX1426

## 2017-07-21 LAB — POCT I-STAT, CHEM 8
BUN: 35 mg/dL — AB (ref 6–20)
CALCIUM ION: 1.28 mmol/L (ref 1.15–1.40)
Chloride: 104 mmol/L (ref 101–111)
Creatinine, Ser: 1.5 mg/dL — ABNORMAL HIGH (ref 0.44–1.00)
Glucose, Bld: 132 mg/dL — ABNORMAL HIGH (ref 65–99)
HEMATOCRIT: 47 % — AB (ref 36.0–46.0)
HEMOGLOBIN: 16 g/dL — AB (ref 12.0–15.0)
Potassium: 4.6 mmol/L (ref 3.5–5.1)
SODIUM: 141 mmol/L (ref 135–145)
TCO2: 27 mmol/L (ref 22–32)

## 2017-07-21 LAB — GLUCOSE, CAPILLARY: Glucose-Capillary: 153 mg/dL — ABNORMAL HIGH (ref 65–99)

## 2017-07-21 SURGERY — TURBT (TRANSURETHRAL RESECTION OF BLADDER TUMOR)
Anesthesia: General | Laterality: Right

## 2017-07-21 MED ORDER — ONDANSETRON HCL 4 MG/2ML IJ SOLN
INTRAMUSCULAR | Status: AC
  Filled 2017-07-21: qty 2

## 2017-07-21 MED ORDER — DEXAMETHASONE SODIUM PHOSPHATE 10 MG/ML IJ SOLN
INTRAMUSCULAR | Status: AC
  Filled 2017-07-21: qty 1

## 2017-07-21 MED ORDER — ONDANSETRON HCL 4 MG/2ML IJ SOLN
INTRAMUSCULAR | Status: DC | PRN
  Administered 2017-07-21: 4 mg via INTRAVENOUS

## 2017-07-21 MED ORDER — LIDOCAINE 2% (20 MG/ML) 5 ML SYRINGE
INTRAMUSCULAR | Status: AC
  Filled 2017-07-21: qty 5

## 2017-07-21 MED ORDER — FENTANYL CITRATE (PF) 100 MCG/2ML IJ SOLN
INTRAMUSCULAR | Status: AC
  Filled 2017-07-21: qty 2

## 2017-07-21 MED ORDER — KETOROLAC TROMETHAMINE 30 MG/ML IJ SOLN
INTRAMUSCULAR | Status: AC
  Filled 2017-07-21: qty 1

## 2017-07-21 MED ORDER — KETOROLAC TROMETHAMINE 30 MG/ML IJ SOLN
INTRAMUSCULAR | Status: DC | PRN
  Administered 2017-07-21: 30 mg via INTRAVENOUS

## 2017-07-21 MED ORDER — SODIUM CHLORIDE 0.9 % IV SOLN
INTRAVENOUS | Status: DC
  Administered 2017-07-21: 12:00:00 via INTRAVENOUS
  Filled 2017-07-21: qty 1000

## 2017-07-21 MED ORDER — TRAMADOL HCL 50 MG PO TABS: 50.0000 mg | ORAL_TABLET | Freq: Four times a day (QID) | ORAL | 0 refills | Status: DC | PRN

## 2017-07-21 MED ORDER — FENTANYL CITRATE (PF) 100 MCG/2ML IJ SOLN
INTRAMUSCULAR | Status: DC | PRN
  Administered 2017-07-21 (×4): 25 ug via INTRAVENOUS

## 2017-07-21 MED ORDER — LIDOCAINE 2% (20 MG/ML) 5 ML SYRINGE
INTRAMUSCULAR | Status: DC | PRN
  Administered 2017-07-21: 80 mg via INTRAVENOUS

## 2017-07-21 MED ORDER — PROPOFOL 10 MG/ML IV BOLUS
INTRAVENOUS | Status: DC | PRN
  Administered 2017-07-21: 200 mg via INTRAVENOUS

## 2017-07-21 MED ORDER — DEXTROSE 5 % IV SOLN
5.0000 mg/kg | INTRAVENOUS | Status: AC
  Administered 2017-07-21: 380 mg via INTRAVENOUS
  Filled 2017-07-21: qty 9.75

## 2017-07-21 MED ORDER — FENTANYL CITRATE (PF) 100 MCG/2ML IJ SOLN
25.0000 ug | INTRAMUSCULAR | Status: DC | PRN
  Filled 2017-07-21: qty 1

## 2017-07-21 MED ORDER — PROPOFOL 10 MG/ML IV BOLUS
INTRAVENOUS | Status: AC
  Filled 2017-07-21: qty 20

## 2017-07-21 MED ORDER — DEXAMETHASONE SODIUM PHOSPHATE 4 MG/ML IJ SOLN
INTRAMUSCULAR | Status: DC | PRN
  Administered 2017-07-21: 10 mg via INTRAVENOUS

## 2017-07-21 SURGICAL SUPPLY — 22 items
BAG DRAIN URO-CYSTO SKYTR STRL (DRAIN) ×4 IMPLANT
BAG DRN ANRFLXCHMBR STRAP LEK (BAG)
BAG DRN UROCATH (DRAIN) ×2
BAG URINE DRAINAGE (UROLOGICAL SUPPLIES) IMPLANT
BAG URINE LEG 19OZ MD ST LTX (BAG) IMPLANT
BAG URINE LEG 500ML (DRAIN) IMPLANT
CATH FOLEY 2WAY SLVR  5CC 22FR (CATHETERS)
CATH FOLEY 2WAY SLVR 30CC 20FR (CATHETERS) IMPLANT
CATH FOLEY 2WAY SLVR 5CC 22FR (CATHETERS) IMPLANT
CLOTH BEACON ORANGE TIMEOUT ST (SAFETY) ×4 IMPLANT
ELECT REM PT RETURN 9FT ADLT (ELECTROSURGICAL)
ELECTRODE REM PT RTRN 9FT ADLT (ELECTROSURGICAL) ×2 IMPLANT
EVACUATOR MICROVAS BLADDER (UROLOGICAL SUPPLIES) IMPLANT
GLOVE BIO SURGEON STRL SZ7.5 (GLOVE) ×4 IMPLANT
GOWN STRL REUS W/ TWL LRG LVL3 (GOWN DISPOSABLE) ×2 IMPLANT
GOWN STRL REUS W/TWL LRG LVL3 (GOWN DISPOSABLE) ×4
KIT RM TURNOVER CYSTO AR (KITS) ×4 IMPLANT
MANIFOLD NEPTUNE II (INSTRUMENTS) ×2 IMPLANT
PACK CYSTO (CUSTOM PROCEDURE TRAY) ×4 IMPLANT
SYRINGE IRR TOOMEY STRL 70CC (SYRINGE) IMPLANT
TUBE CONNECTING 12'X1/4 (SUCTIONS) ×1
TUBE CONNECTING 12X1/4 (SUCTIONS) ×1 IMPLANT

## 2017-07-21 NOTE — Progress Notes (Signed)
CBG 158  

## 2017-07-21 NOTE — H&P (Signed)
Tasha Tucker is an 66 y.o. female.    Chief Complaint:   HPI:   1 - Left Renal Pelvis Cancer / Bladder Cancer - s/p LEFT robotic nephro-ureterectomy and peri-aortic nod dissection 09/2016 for pT1N0Mx high grade urothelial carcinoma with NEGATIVE margins. Prior chronic actos use.   Post-op Surveillance:  04/2017 - CT, CMP, CXR, Cysto - Cr 1.2, 2cm dome, 2cm left wall (close to prior UO) recurrence ==> TURBT / Mito-C T1G3 without muscle in specimen. Base biopsies negative.   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 She is compliant diabetic.   PMH sig for obesity, lap hyst (benign), BTL, lap chole, DM2 (no neuropathy). Her PCP is Steva Colder MD.   Today "Tasha Tucker" is seen to proceed with restaging TURBT for history of high grade superficial bladder cancer. No interval fevers. Most recent UA without infectious parameters.    Past Medical History:  Diagnosis Date  . Bladder cancer ([place])    [place]  . DJD (degenerative joint disease)    NECK, NEEDS PILLOW WHEN LYING FLAT IS POSSIBLE  . History of cerebral aneurysm repair    1996-- S/P CLAPPING RIGHT CAROTID --- NO DEFICITS  . 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 Hyperlipidemia   . Hypertension   . Liver hemangioma   . OA (osteoarthritis)   . PAC (premature atrial contraction)   . PONV (postoperative nausea and vomiting)   . Right trigger finger   . Type 2 diabetes mellitus ([place])   . Wears glasses     Past Surgical History:  Procedure Laterality Date  . ABDOMINAL HYSTERECTOMY  1990   PARTIAL  . CATARACT EXTRACTION W/ INTRAOCULAR LENS  IMPLANT, BILATERAL  2016; 2017  . CEREBRAL ANEURYSM REPAIR  1996   clipped-no deficits RIGHT CAROTID  . CHOLECYSTECTOMY  2010  . CYSTOSCOPY W/ RETROGRADES Right 06/09/2017   Procedure: CYSTOSCOPY WITH RETROGRADE PYELOGRAM;  Surgeon: Alexis Frock, MD;  Location:  [facility];  Service: Urology;  Laterality: Right;  . CYSTOSCOPY WITH RETROGRADE PYELOGRAM, URETEROSCOPY AND STENT PLACEMENT Left 08/26/2016   Procedure: CYSTOSCOPY WITH BILATERAL RETROGRADE PYELOGRAM,LEFT URETEROSCOPY WITH BIOPSY AND LEFT STENT PLACEMENT;  Surgeon: Alexis Frock, MD;  Location: [facility];  Service: Urology;  Laterality: Left;  . ROBOT ASSITED LAPAROSCOPIC NEPHROURETERECTOMY Left 10/09/2016   Procedure: XI ROBOT ASSITED LAPAROSCOPIC NEPHROURETERECTOMY;  Surgeon: Alexis Frock, MD;  Location: [facility];  Service: Urology;  Laterality: Left;  . SURGERY ON FOOT FOR STAPH INFECTION  1989  . TRANSURETHRAL RESECTION OF BLADDER TUMOR WITH MITOMYCIN-C N/A 06/09/2017   Procedure: TRANSURETHRAL RESECTION OF BLADDER TUMOR WITH MITOMYCIN-C;  Surgeon: Alexis Frock, MD;  Location: [facility];  Service: Urology;  Laterality: N/A;  . TUBAL LIGATION  1980    Family History  Problem Relation Age of Onset  . Breast cancer Neg Hx    Social History:  reports that she has never smoked. She has never used smokeless tobacco. She reports that she drinks alcohol. She reports that she does not use drugs.  Allergies:  Allergies  Allergen Reactions  . Adhesive [Tape] Rash    PAPER TAPE ONLY  . Chlorhexidine Gluconate Itching and Rash    Topical antiseptic (CHG WIPES)   . Dilantin [Phenytoin] Rash  . Penicillins Rash    Has patient had a PCN reaction causing immediate rash, facial/tongue/throat swelling, SOB  or lightheadedness with hypotension:unsure Has patient had a PCN reaction causing severe rash involving mucus membranes or skin necrosis:unsure Has patient had a PCN reaction that required hospitalization:No Has patient had a PCN reaction occurring within the last 10 years:No If all of the above answers are "NO", then may proceed with Cephalosporin use.   . Sulfa Antibiotics Rash    No prescriptions prior to admission.    No results found  for this or any previous visit (from the past 48 hour(s)). No results found.  Review of Systems  Constitutional: Negative.  Negative for chills and fever.  HENT: Negative.   Eyes: Negative.   Respiratory: Negative.   Cardiovascular: Negative.   Gastrointestinal: Negative.   Genitourinary: Negative.   Musculoskeletal: Negative.   Skin: Negative.   Neurological: Negative.   Endo/Heme/Allergies: Negative.   Psychiatric/Behavioral: Negative.     Height 5\' 8"  (1.727 m), weight 96.2 kg (212 lb). Physical Exam  Constitutional: She appears well-developed.  HENT:  Head: Normocephalic.  Eyes: Pupils are equal, round, and reactive to light.  Cardiovascular: Normal rate.   Respiratory: Effort normal.  GI: Soft.  Genitourinary:  Genitourinary Comments: NO CVAT at present.   Musculoskeletal: Normal range of motion.  Neurological: She is alert.  Skin: Skin is warm.  Psychiatric: She has a normal mood and affect.     Assessment/Plan  Proceed as planned with restaging TURBT and Rt retrograde. Risks, benefits, alternatives, expected peri-op course discussed previously and reiterated today.   Alexis Frock, MD 07/21/2017, 6:48 AM

## 2017-07-21 NOTE — Anesthesia Procedure Notes (Signed)
Procedure Name: LMA Insertion Date/Time: 07/21/2017 12:58 PM Performed by: Justice Rocher Pre-anesthesia Checklist: Patient identified, Emergency Drugs available, Suction available and Patient being monitored Patient Re-evaluated:Patient Re-evaluated prior to induction Oxygen Delivery Method: Circle system utilized Preoxygenation: Pre-oxygenation with 100% oxygen Induction Type: IV induction Ventilation: Mask ventilation without difficulty LMA: LMA inserted LMA Size: 4.0 Number of attempts: 1 Airway Equipment and Method: Bite block Placement Confirmation: positive ETCO2 and breath sounds checked- equal and bilateral Tube secured with: Tape Dental Injury: Teeth and Oropharynx as per pre-operative assessment

## 2017-07-21 NOTE — Brief Op Note (Signed)
07/21/2017  1:19 PM  PATIENT:  Tasha Tucker  66 y.o. female  PRE-OPERATIVE DIAGNOSIS:  BLADDER CANCER  POST-OPERATIVE DIAGNOSIS:  BLADDER CANCER  PROCEDURE:  Procedure(s): TRANSURETHRAL RESECTION OF BLADDER TUMOR (TURBT) (N/A) CYSTOSCOPY WITH RETROGRADE PYELOGRAM (Right)  SURGEON:  Surgeon(s) and Role:    * Alexis Frock, MD - Primary  PHYSICIAN ASSISTANT:   ASSISTANTS: none   ANESTHESIA:   general  EBL:  0 mL   BLOOD ADMINISTERED:none  DRAINS: none   LOCAL MEDICATIONS USED:  NONE  SPECIMEN:  Source of Specimen:  1 - old resection site; 2 - base of old resection site  DISPOSITION OF SPECIMEN:  PATHOLOGY  COUNTS:  YES  TOURNIQUET:  * No tourniquets in log *  DICTATION: .Other Dictation: Dictation Number [id number]  PLAN OF CARE: Discharge to home after PACU  PATIENT DISPOSITION:  PACU - hemodynamically stable.   Delay start of Pharmacological VTE agent (>24hrs) due to surgical blood loss or risk of bleeding: yes

## 2017-07-21 NOTE — Interval H&P Note (Signed)
History and Physical Interval Note:  07/21/2017 12:23 PM  Tasha Tucker  has presented today for surgery, with the diagnosis of BLADDER CANCER  The various methods of treatment have been discussed with the patient and family. After consideration of risks, benefits and other options for treatment, the patient has consented to  Procedure(s): TRANSURETHRAL RESECTION OF BLADDER TUMOR (TURBT) (N/A) CYSTOSCOPY WITH RETROGRADE PYELOGRAM (Right) as a surgical intervention .  The patient's history has been reviewed, patient examined, no change in status, stable for surgery.  I have reviewed the patient's chart and labs.  Questions were answered to the patient's satisfaction.     Ellianah Cordy

## 2017-07-21 NOTE — Discharge Instructions (Signed)
1 - You may have urinary urgency (bladder spasms) and bloody urine on / off x few days. This is normal.  2 - Call MD or go to ER for fever >102, severe pain / nausea / vomiting not relieved by medications, or acute change in medical status  Post Anesthesia Home Care Instructions  Activity: Get plenty of rest for the remainder of the day. A responsible individual must stay with you for 24 hours following the procedure.  For the next 24 hours, DO NOT: -Drive a car -Paediatric nurse -Drink alcoholic beverages -Take any medication unless instructed by your physician -Make any legal decisions or sign important papers.  Meals: Start with liquid foods such as gelatin or soup. Progress to regular foods as tolerated. Avoid greasy, spicy, heavy foods. If nausea and/or vomiting occur, drink only clear liquids until the nausea and/or vomiting subsides. Call your physician if vomiting continues.  Special Instructions/Symptoms: Your throat may feel dry or sore from the anesthesia or the breathing tube placed in your throat during surgery. If this causes discomfort, gargle with warm salt water. The discomfort should disappear within 24 hours.  If you had a scopolamine patch placed behind your ear for the management of post- operative nausea and/or vomiting:  1. The medication in the patch is effective for 72 hours, after which it should be removed.  Wrap patch in a tissue and discard in the trash. Wash hands thoroughly with soap and water. 2. You may remove the patch earlier than 72 hours if you experience unpleasant side effects which may include dry mouth, dizziness or visual disturbances. 3. Avoid touching the patch. Wash your hands with soap and water after contact with the patch.   May take ibuprofen after 7:30 pm

## 2017-07-21 NOTE — Anesthesia Preprocedure Evaluation (Signed)
Anesthesia Evaluation  Patient identified by MRN, date of birth, ID band Patient awake    Reviewed: Allergy & Precautions, H&P , Patient's Chart, lab work & pertinent test results, reviewed documented beta blocker date and time   Airway Mallampati: II  TM Distance: >3 FB Neck ROM: full    Dental no notable dental hx.    Pulmonary    Pulmonary exam normal breath sounds clear to auscultation       Cardiovascular hypertension, On Medications  Rhythm:regular Rate:Normal     Neuro/Psych    GI/Hepatic   Endo/Other  diabetes, Type 2  Renal/GU      Musculoskeletal   Abdominal   Peds  Hematology   Anesthesia Other Findings   Reproductive/Obstetrics                             Anesthesia Physical Anesthesia Plan  ASA: III  Anesthesia Plan: General   Post-op Pain Management:    Induction: Intravenous  PONV Risk Score and Plan: 2 and Ondansetron, Dexamethasone and Treatment may vary due to age or medical condition  Airway Management Planned: LMA  Additional Equipment:   Intra-op Plan:   Post-operative Plan:   Informed Consent: I have reviewed the patients History and Physical, chart, labs and discussed the procedure including the risks, benefits and alternatives for the proposed anesthesia with the patient or authorized representative who has indicated his/her understanding and acceptance.   Dental Advisory Given  Plan Discussed with: CRNA and Surgeon  Anesthesia Plan Comments: ( )        Anesthesia Quick Evaluation

## 2017-07-21 NOTE — Transfer of Care (Signed)
Immediate Anesthesia Transfer of Care Note  Patient: Tasha Tucker  Procedure(s) Performed: Procedure(s) (LRB): TRANSURETHRAL RESECTION OF BLADDER TUMOR (TURBT) (N/A) CYSTOSCOPY WITH RETROGRADE PYELOGRAM (Right)  Patient Location: PACU  Anesthesia Type: General  Level of Consciousness: awake, sedated, patient cooperative and responds to stimulation  Airway & Oxygen Therapy: Patient Spontanous Breathing and Patient connected to [place] oxygen  Post-op Assessment: Report given to PACU RN, Post -op Vital signs reviewed and stable and Patient moving all extremities  Post vital signs: Reviewed and stable  Complications: No apparent anesthesia complications

## 2017-07-22 ENCOUNTER — Encounter (HOSPITAL_BASED_OUTPATIENT_CLINIC_OR_DEPARTMENT_OTHER): Payer: Self-pay | Admitting: Urology

## 2017-07-22 NOTE — Anesthesia Postprocedure Evaluation (Signed)
Anesthesia Post Note  Patient: Tasha Tucker  Procedure(s) Performed: TRANSURETHRAL RESECTION OF BLADDER TUMOR (TURBT) (N/A ) CYSTOSCOPY WITH RETROGRADE PYELOGRAM (Right )     Patient location during evaluation: PACU Anesthesia Type: General Level of consciousness: awake and alert Pain management: pain level controlled Vital Signs Assessment: post-procedure vital signs reviewed and stable Respiratory status: spontaneous breathing, nonlabored ventilation, respiratory function stable and patient connected to nasal cannula oxygen Cardiovascular status: blood pressure returned to baseline and stable Postop Assessment: no apparent nausea or vomiting Anesthetic complications: no    Last Vitals:  Vitals:   07/21/17 1345 07/21/17 1400  BP: (!) 143/54 (!) 156/95  Pulse: 65 61  Resp: 18 (!) 21  Temp:    SpO2: 98% 99%    Last Pain:  Vitals:   07/21/17 1047  TempSrc: Oral   Pain Goal: Patients Stated Pain Goal: 4 (07/21/17 1138)               Lyndle Herrlich EDWARD

## 2017-07-22 NOTE — Op Note (Signed)
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  PROCEDURES: 1. Restaging transurethral resection of bladder tumor, volume small. 2. Right retrograde pyelogram with interpretation.  ESTIMATED BLOOD LOSS:  Nil.  COMPLICATION:  None.  SPECIMEN: 1. Old bladder resection site for permanent pathology. 2. Base of old resection site for permanent pathology.  FINDINGS: 1. No obvious recurrent papillary tumor of the urinary bladder. 2. Absence of left ureteral orifice consistent with prior left     nephroureterectomy. 3. Unremarkable right retrograde pyelogram.  INDICATIONS:  Tasha Tucker is a 66 year old lady with history of left renal pelvis cancer.  She is status post nephroureterectomy several years ago.  She has been on surveillance protocol including about bladder surveillance following this.  She has unfortunately developed high-grade superficial bladder cancer.  She underwent transurethral resection several months ago of gross tumor.  This was fortunately just superficial.  She is BCG naive.  Options were discussed for further management including restaging transurethral resection and she wished to proceed.  Informed consent was obtained and placed in the medical record.  PROCEDURE IN DETAIL:  The patient being, Tasha Tucker, was verified. Procedure being, restaging transurethral resection of bladder tumor, was confirmed.  Procedure was carried out.  Time-out was performed. Intravenous antibiotics were administered.  General anesthesia was introduced.  The patient was placed into a low lithotomy position. Sterile field was created by prepping and draping the patient's  vagina, introitus, and proximal thighs using iodine.  Cystourethroscopy was performed using a 26-French resectoscope sheath with visual obturator.  Inspection of urinary bladder revealed some small erythema in the area of prior left ureteral orifice and prior known left trigone bladder tumor, but no obvious recurrent papillary tumor.  The right ureteral orifice was unremarkable as was the rest of the bladder.  Attention was directed to the right retrograde pyelogram. The right ureteral orifice was cannulated with a 6-French end-hole catheter and right retrograde pyelogram was obtained.  Right retrograde pyelogram demonstrated a single right ureter with single-system right kidney.  No filling defects or narrowing noted.  Careful resection was performed using medium resectoscope loop and saline of the old resection site.  Total surface area approximately 2.5 sq cm.  This was set aside for permanent pathology, labeled as old resection site.  Cold cup biopsy forceps were used to obtain what appeared to be representative seromuscular deep bites from this.  These were set aside and labeled as base of old resection site.  Additional coagulation current was applied to the bed resection and biopsy.  There was no evidence of bladder perforation.  Hemostasis appeared to be excellent. The right ureteral orifice remained visibly patent.  It was not felt that catheterization would be warranted.  As such, the bladder was emptied per cystoscope.  Procedure was then  terminated.  The patient tolerated procedure well.  There were no immediate periprocedural complications.  The patient was taken to the postanesthesia care unit in stable condition.    ______________________________ Alexis Frock, MD   ______________________________ Alexis Frock, MD    TM/MEDQ  D:  07/21/2017  T:  07/21/2017  Job:  [id number]

## 2017-08-30 LAB — HEPATIC FUNCTION PANEL
ALK PHOS: 128 — AB (ref 25–125)
ALT: 42 — AB (ref 7–35)
AST: 29 (ref 13–35)
BILIRUBIN, TOTAL: 0.7

## 2017-08-30 LAB — CBC AND DIFFERENTIAL
HCT: 45 (ref 36–46)
HEMOGLOBIN: 14.7 (ref 12.0–16.0)
PLATELETS: 188 (ref 150–399)
WBC: 7.2

## 2017-08-30 LAB — BASIC METABOLIC PANEL
BUN: 36 — AB (ref 4–21)
Creatinine: 1.8 — AB (ref 0.5–1.1)
Glucose: 217
Potassium: 4.9 (ref 3.4–5.3)
SODIUM: 140 (ref 137–147)

## 2017-08-30 LAB — TSH: TSH: 1.91 (ref 0.41–5.90)

## 2017-12-06 ENCOUNTER — Other Ambulatory Visit: Payer: Self-pay | Admitting: Family Medicine

## 2017-12-06 DIAGNOSIS — Z1231 Encounter for screening mammogram for malignant neoplasm of breast: Secondary | ICD-10-CM

## 2017-12-14 NOTE — Progress Notes (Addendum)
[facility] [address] [place], [place] [address] [phone]  Date:  12/16/2017   Name:  Tasha Tucker   DOB:  [birth year]   MRN:  [id number]  PCP:  Darreld Mclean, MD    Chief Complaint: New Patient (Initial Visit) (unsatisfied with previous pcp, was told to take oral medications for diabetes when nephrologist told her it was not safe)   History of Present Illness:  Tasha Tucker is a 67 y.o. very pleasant female patient who presents with the following:  New patient here today to establish care History of kidney cancer/ bladder cancer- she had a LEFT nephrectomy 1/18 She was on actos and avandia in the past- ? Thought to be the cause of her kidney cancer  Her nephrologist is Dr. Hollie Salk She just started seeing nephrology after her nephrectomy as her urologist was concerned about her kidney function numbers Dr. Tresa Moore is her urologist, she continues to see him on a regular basis.  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 We will request her past records to fill in her chart here    Patient Active Problem List   Diagnosis Date Noted  . Cancer of renal pelvis, left ([place]) 10/12/2016  . Renal mass 10/09/2016     Past Medical History:  Diagnosis Date  . Bladder cancer ([place])    [place]  . DJD (degenerative joint disease)    NECK, NEEDS PILLOW WHEN LYING FLAT IS POSSIBLE  . History of cerebral aneurysm repair    1996-- S/P CLAPPING RIGHT CAROTID --- NO DEFICITS  . 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 Hyperlipidemia   . Hypertension   . Liver hemangioma   . OA (osteoarthritis)   . PAC (premature atrial contraction)   . PONV (postoperative nausea and vomiting)   . Right trigger finger   . Type 2 diabetes mellitus ([place])   . Wears glasses     Past Surgical History:  Procedure Laterality Date  . ABDOMINAL HYSTERECTOMY  1990   PARTIAL  . CATARACT EXTRACTION W/ INTRAOCULAR LENS  IMPLANT, BILATERAL  2016; 2017  . CEREBRAL ANEURYSM REPAIR  1996   clipped-no deficits RIGHT CAROTID  . CHOLECYSTECTOMY  2010  . CYSTOSCOPY W/ RETROGRADES Right 06/09/2017   Procedure: CYSTOSCOPY WITH RETROGRADE PYELOGRAM;  Surgeon: Alexis Frock, MD;  Location[facility];  Service: Urology;  Laterality: Right;  . CYSTOSCOPY W/ RETROGRADES Right 07/21/2017   Procedure: CYSTOSCOPY WITH RETROGRADE PYELOGRAM;  Surgeon: Alexis Frock, MD;  Location:  [facility];  Service: Urology;  Laterality: Right;  . CYSTOSCOPY WITH RETROGRADE PYELOGRAM, URETEROSCOPY AND STENT PLACEMENT Left 08/26/2016   Procedure: CYSTOSCOPY WITH BILATERAL RETROGRADE PYELOGRAM,LEFT URETEROSCOPY WITH BIOPSY AND LEFT STENT PLACEMENT;  Surgeon: Alexis Frock, MD;  Location: [facility];  Service: Urology;  Laterality: Left;  . ROBOT ASSITED LAPAROSCOPIC NEPHROURETERECTOMY Left 10/09/2016   Procedure: XI ROBOT ASSITED LAPAROSCOPIC NEPHROURETERECTOMY;  Surgeon: Alexis Frock, MD;  Location: [facility];  Service: Urology;  Laterality: Left;  . SURGERY ON FOOT FOR STAPH INFECTION  1989  . TRANSURETHRAL  RESECTION OF BLADDER TUMOR N/A 07/21/2017   Procedure: TRANSURETHRAL RESECTION OF BLADDER TUMOR (TURBT);  Surgeon: Alexis Frock, MD;  Location[facility];  Service: Urology;  Laterality: N/A;  . TRANSURETHRAL RESECTION OF BLADDER TUMOR WITH MITOMYCIN-C N/A 06/09/2017   Procedure: TRANSURETHRAL RESECTION OF BLADDER TUMOR WITH MITOMYCIN-C;  Surgeon: Alexis Frock, MD;  Location: [facility];  Service: Urology;  Laterality: N/A;  . TUBAL LIGATION  1980    Social History   Tobacco Use  . Smoking status: Never Smoker  . Smokeless tobacco: Never Used  Substance Use Topics  . Alcohol use: Yes    Comment: RARE  . Drug use: No    Family History  Problem Relation Age of Onset  . Cancer Mother   . Hyperlipidemia Father   . Heart attack Father   . Diabetes Brother   . Breast cancer Neg Hx     Allergies  Allergen Reactions  . Adhesive [Tape] Rash    PAPER TAPE ONLY  . Chlorhexidine Gluconate Itching and Rash    Topical antiseptic (CHG WIPES)   . Dilantin [Phenytoin] Rash  . Penicillins Rash    Has patient had a PCN reaction causing immediate rash, facial/tongue/throat swelling, SOB or lightheadedness with hypotension:unsure Has patient had a PCN reaction causing severe rash involving mucus membranes or skin necrosis:unsure Has patient had a PCN reaction that required hospitalization:No Has patient had a PCN reaction occurring within the last 10 years:No If all of the above answers are "NO", then may proceed with Cephalosporin use.   . Sulfa Antibiotics Rash    Medication list has been reviewed and updated.  Current Outpatient Medications on File Prior to Visit  Medication Sig Dispense Refill  . acetaminophen (TYLENOL) 500 MG tablet Take 1,000 mg by mouth every 6 (six) hours as needed.    . carvedilol (COREG) 6.25 MG tablet Take 6.25 mg by mouth 2 (two) times daily with a meal.    . cetirizine (ZYRTEC) 10 MG tablet Take 10 mg by mouth every  morning.     . Insulin Pen Needle (BD PEN NEEDLE NANO U/F) 32G X 4 MM MISC by Does not apply route.    Marland Kitchen LANTUS SOLOSTAR 100 UNIT/ML Solostar Pen     . ONETOUCH VERIO test strip     . pravastatin (PRAVACHOL) 40 MG tablet Take 40 mg by mouth every evening.     No current facility-administered medications on file prior to visit.     Review of Systems:  As per HPI- otherwise negative. Currently feeling quite well, no acute concerns    Physical Examination: Vitals:   12/16/17 1008  BP: (!) 154/66  Pulse: 61  Resp: 16  SpO2: 99%   Vitals:   12/16/17 1008  Weight: 221 lb 9.6 oz (100.5 kg)  Height: 5\' 8"  (1.727 m)   Body mass index is 33.69 kg/m. Ideal Body Weight: Weight in (  lb) to have BMI = 25: 164.1  GEN: WDWN, NAD, Non-toxic, A & O x 3, overweight, looks well  HEENT: Atraumatic, Normocephalic. Neck supple. No masses, No LAD. Ears and Nose: No external deformity. CV: RRR, No M/G/R. No JVD. No thrill. No extra heart sounds. PULM: CTA B, no wheezes, crackles, rhonchi. No retractions. No resp. distress. No accessory muscle use. ABD: S, NT, ND EXTR: No c/c/e NEURO Normal gait.  PSYCH: Normally interactive. Conversant. Not depressed or anxious appearing.  Calm demeanor.    Assessment and Plan: Controlled type 2 diabetes mellitus with complication, with long-term current use of insulin (HCC) - Plan: Comprehensive metabolic panel, Hemoglobin A1c  Cancer of renal pelvis, left (HCC)  Hyperlipidemia associated with type 2 diabetes mellitus ([place])  establishing care today History of DM on insulin, dyslipidemia, renal/ bladder ca s/p nephrectomy Have requested records from Drs. Staci Righter and [facility] [facility] today pending as above- will be in touch with her results  Assuming labs are ok plan to recheck in 4-6 months   Signed Lamar Blinks, MD  Received her labs 3/26  A1c in January of this year was 6.4 Creat is stable to a bit higher than her recent average Her  medication options are a bit more limited due to her renal function (no metformin) and sulfa allergy (no sulfonylureas)  ?might try a GLP1 agonist or Januvia  Message to pt:  Kidney function is close to your recent baseline Your A1c has gone up a bit since January, when it was at 6.4%.  We should consider making an adjustment to your medication regimen.  We can certainly go up on your insulin; however as you may know insulin can cause weight gain, which can lead to a vicious cycle in diabetes patients.  Certainly we will not stop your insulin, but I would like to use as little as we can.   I also thought of adding a medication from the GLP-1 class, like Trulicity.  These are injectable medications that you would use daily or weekly depending on the brand (using a pen kind of like your insulin pen).  We also could consider Januvia- this is an oral med, perhaps not quite as effective in bringing down the A1c as a GLP mediation, but one less shot to take.  I am always happy to run any new medications by Dr. Hollie Salk as well.  What are your thoughts about how you would like to proceed? Results for orders placed or performed in visit on 12/16/17  Comprehensive metabolic panel  Result Value Ref Range   Sodium 139 135 - 145 mEq/L   Potassium 4.6 3.5 - 5.1 mEq/L   Chloride 105 96 - 112 mEq/L   CO2 24 19 - 32 mEq/L   Glucose, Bld 230 (H) 70 - 99 mg/dL   BUN 35 (H) 6 - 23 mg/dL   Creatinine, Ser 1.60 (H) 0.40 - 1.20 mg/dL   Total Bilirubin 0.4 0.2 - 1.2 mg/dL   Alkaline Phosphatase 93 39 - 117 U/L   AST 28 0 - 37 U/L   ALT 37 (H) 0 - 35 U/L   Total Protein 7.2 6.0 - 8.3 g/dL   Albumin 3.7 3.5 - 5.2 g/dL   Calcium 10.0 8.4 - 10.5 mg/dL   GFR 34.00 (L) >60.00 mL/min  Hemoglobin A1c  Result Value Ref Range   Hgb A1c MFr Bld 8.4 (H) 4.6 - 6.5 %

## 2017-12-16 ENCOUNTER — Ambulatory Visit (INDEPENDENT_AMBULATORY_CARE_PROVIDER_SITE_OTHER): Payer: Medicare Other | Admitting: Family Medicine

## 2017-12-16 ENCOUNTER — Encounter: Payer: Self-pay | Admitting: Family Medicine

## 2017-12-16 VITALS — BP 145/75 | HR 61 | Resp 16 | Ht 68.0 in | Wt 221.6 lb

## 2017-12-16 DIAGNOSIS — E785 Hyperlipidemia, unspecified: Secondary | ICD-10-CM

## 2017-12-16 DIAGNOSIS — E118 Type 2 diabetes mellitus with unspecified complications: Secondary | ICD-10-CM | POA: Diagnosis not present

## 2017-12-16 DIAGNOSIS — Z794 Long term (current) use of insulin: Secondary | ICD-10-CM

## 2017-12-16 DIAGNOSIS — E1169 Type 2 diabetes mellitus with other specified complication: Secondary | ICD-10-CM | POA: Diagnosis not present

## 2017-12-16 DIAGNOSIS — C652 Malignant neoplasm of left renal pelvis: Secondary | ICD-10-CM

## 2017-12-16 LAB — HEMOGLOBIN A1C: Hgb A1c MFr Bld: 8.4 % — ABNORMAL HIGH (ref 4.6–6.5)

## 2017-12-16 NOTE — Patient Instructions (Signed)
It was nice to meet you today!  I will be in touch with your labs over mychart asap We will request records from Drs. Staci Righter and [facility] for your chart here

## 2017-12-17 LAB — COMPREHENSIVE METABOLIC PANEL
ALT: 37 U/L — ABNORMAL HIGH (ref 0–35)
AST: 28 U/L (ref 0–37)
Albumin: 3.7 g/dL (ref 3.5–5.2)
Alkaline Phosphatase: 93 U/L (ref 39–117)
BILIRUBIN TOTAL: 0.4 mg/dL (ref 0.2–1.2)
BUN: 35 mg/dL — ABNORMAL HIGH (ref 6–23)
CALCIUM: 10 mg/dL (ref 8.4–10.5)
CO2: 24 meq/L (ref 19–32)
Chloride: 105 mEq/L (ref 96–112)
Creatinine, Ser: 1.6 mg/dL — ABNORMAL HIGH (ref 0.40–1.20)
GFR: 34 mL/min — AB (ref >60.00)
Glucose, Bld: 230 mg/dL — ABNORMAL HIGH (ref 70–99)
POTASSIUM: 4.6 meq/L (ref 3.5–5.1)
Sodium: 139 mEq/L (ref 135–145)
Total Protein: 7.2 g/dL (ref 6.0–8.3)

## 2017-12-21 ENCOUNTER — Encounter: Payer: Self-pay | Admitting: Family Medicine

## 2017-12-22 ENCOUNTER — Encounter: Payer: Self-pay | Admitting: Family Medicine

## 2017-12-29 ENCOUNTER — Encounter: Payer: Self-pay | Admitting: Family Medicine

## 2017-12-29 DIAGNOSIS — I159 Secondary hypertension, unspecified: Secondary | ICD-10-CM | POA: Insufficient documentation

## 2017-12-29 DIAGNOSIS — N184 Chronic kidney disease, stage 4 (severe): Secondary | ICD-10-CM | POA: Insufficient documentation

## 2017-12-29 DIAGNOSIS — I671 Cerebral aneurysm, nonruptured: Secondary | ICD-10-CM | POA: Insufficient documentation

## 2017-12-31 ENCOUNTER — Other Ambulatory Visit: Payer: Self-pay | Admitting: Emergency Medicine

## 2017-12-31 MED ORDER — ONETOUCH VERIO VI STRP: ORAL_STRIP | 2 refills | Status: DC

## 2018-01-03 NOTE — Telephone Encounter (Signed)
Pt. Was asking about coming in to have a re-check of her A1C,  There are no orders in and wants to schedule appt.    Question if she needs to see Dr. Edilia Bo with this appt.

## 2018-01-05 ENCOUNTER — Other Ambulatory Visit: Payer: Self-pay

## 2018-01-05 MED ORDER — ONETOUCH DELICA LANCETS 33G MISC: 6 refills | Status: DC

## 2018-01-12 ENCOUNTER — Telehealth: Payer: Self-pay | Admitting: Family Medicine

## 2018-01-12 DIAGNOSIS — E785 Hyperlipidemia, unspecified: Secondary | ICD-10-CM | POA: Insufficient documentation

## 2018-01-12 DIAGNOSIS — E782 Mixed hyperlipidemia: Secondary | ICD-10-CM

## 2018-01-12 NOTE — Telephone Encounter (Signed)
Received records from Dr. Loren Racer office, will abstract and scan as appropriate

## 2018-01-17 ENCOUNTER — Ambulatory Visit
Admission: RE | Admit: 2018-01-17 | Discharge: 2018-01-17 | Disposition: A | Discharge status: Home / Self Care | Payer: Medicare Other | Source: Ambulatory Visit | Attending: Family Medicine | Admitting: Family Medicine

## 2018-01-17 ENCOUNTER — Encounter: Payer: Self-pay | Admitting: Family Medicine

## 2018-01-17 DIAGNOSIS — Z1231 Encounter for screening mammogram for malignant neoplasm of breast: Secondary | ICD-10-CM

## 2018-01-17 NOTE — Telephone Encounter (Signed)
I have called and scheduled patient appointment.

## 2018-01-20 ENCOUNTER — Encounter: Payer: Self-pay | Admitting: Family Medicine

## 2018-02-14 ENCOUNTER — Ambulatory Visit: Payer: Medicare Other | Admitting: Nurse Practitioner

## 2018-03-01 ENCOUNTER — Other Ambulatory Visit: Payer: Self-pay | Admitting: Family Medicine

## 2018-03-19 DIAGNOSIS — Z794 Long term (current) use of insulin: Principal | ICD-10-CM

## 2018-03-19 DIAGNOSIS — E118 Type 2 diabetes mellitus with unspecified complications: Secondary | ICD-10-CM | POA: Insufficient documentation

## 2018-03-19 NOTE — Progress Notes (Signed)
[place] at [facility] [address] [place], [place] [address] [phone]  Date:  03/21/2018   Name:  Tasha Tucker   DOB:  05/18/1951   MRN:  [id number]  PCP:  Darreld Mclean, MD    Chief Complaint: Diabetes (3 month follow up, hyperglycemia)   History of Present Illness:  [name] is a 67 y.o. very pleasant female patient who presents with the following:  Periodic follow-up visit today History of CDK/ renal cancer, hyperlipidemia, HTN, cerebral aneurysm, DM Last seen here in March as a new patient:  History of kidney cancer/ bladder cancer- she had a LEFT nephrectomy 1/18 She was on actos and avandia in the past- ? Thought to be the cause of her kidney cancer Her nephrologist is Dr. Hollie Salk She just started seeing nephrology after her nephrectomy as her urologist was concerned about her kidney function numbers Dr. Tresa Moore is her urologist, she continues to see him on a regular basis.  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  Treatment options limited due to her kidney concerns.  We went up from 20 to 24 units, but then reduced again to 22 due to sugars near 100  Lab Results  Component Value Date   HGBA1C 8.4 (H) 12/16/2017    She notes that her blood sugars went too low- to 77 nadir. She went down to 22 and then started to run high again. She went back to 24 units She always checks her glucose fasting in the am and her readings tend to be less than 150 or so,  We wonder if her after dinner sugars are running much higher- she dinner tends to be her biggest meal.  Will have her vary the time of day that she checks her glucose  She is on lantus 24 units currently  She went to [place] and [place] on a cruise last month  She started on glucosamine for her hands about 2 weeks ago- this seems to have helped a bit, she is not sure how long to give it to see full benefit.  Advised her that per my knowledge there are not good quality studies that give this answer, but suggested trying it for 4-6 weeks before it she decides if it helps   Her last dexa was about 2 years ago, will schedule for her today  She is going on a mission trip with her church in July- will be working on [employer]   Patient Active Problem List   Diagnosis Date Noted  . Controlled type 2 diabetes mellitus with complication, with long-term current use of insulin ([place]) 03/19/2018  . Hyperlipidemia  01/12/2018  . Cerebral aneurysm 12/29/2017  . Secondary hypertension, unspecified 12/29/2017  . CKD (chronic kidney disease) stage 4, GFR 15-29 ml/min (H[facility]) 12/29/2017  . Cancer of renal pelvis, left ([place]) 10/12/2016  . Renal mass 10/09/2016    Past Medical History:  Diagnosis Date  . Bladder cancer ([place])    [place]  . DJD (degenerative joint disease)    NECK, NEEDS PILLOW WHEN LYING FLAT IS POSSIBLE  . History of cerebral aneurysm repair    1996-- S/P CLAPPING RIGHT CAROTID --- NO DEFICITS  . 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 Hyperlipidemia   . Hypertension   . Liver hemangioma   . OA (osteoarthritis)   . PAC (premature atrial  contraction)   . PONV (postoperative nausea and vomiting)   . Right trigger finger   . Type 2 diabetes mellitus ([place])   . Wears glasses     Past Surgical History:  Procedure Laterality Date  . ABDOMINAL HYSTERECTOMY  1990   PARTIAL  . CATARACT EXTRACTION W/ INTRAOCULAR LENS  IMPLANT, BILATERAL  2016; 2017  . CEREBRAL ANEURYSM REPAIR  1996   clipped-no deficits RIGHT CAROTID  . CHOLECYSTECTOMY  2010  . CYSTOSCOPY W/ RETROGRADES Right 06/09/2017   Procedure: CYSTOSCOPY WITH RETROGRADE PYELOGRAM;  Surgeon: Alexis Frock, MD;  Location: [facility];  Service: Urology;  Laterality: Right;  . CYSTOSCOPY W/ RETROGRADES Right 07/21/2017   Procedure: CYSTOSCOPY WITH RETROGRADE PYELOGRAM;  Surgeon: Alexis Frock, MD;  Location[name];  Service: Urology;  Laterality: Right;  . CYSTOSCOPY WITH RETROGRADE PYELOGRAM, URETEROSCOPY AND STENT PLACEMENT Left 08/26/2016   Procedure: CYSTOSCOPY WITH BILATERAL RETROGRADE PYELOGRAM,LEFT URETEROSCOPY WITH BIOPSY AND LEFT STENT PLACEMENT;  Surgeon: Alexis Frock, MD;  Location: [facility];  Service: Urology;  Laterality: Left;  . ROBOT ASSITED LAPAROSCOPIC NEPHROURETERECTOMY Left 10/09/2016   Procedure: XI ROBOT ASSITED LAPAROSCOPIC NEPHROURETERECTOMY;  Surgeon: Alexis Frock, MD;  Location: [facility];  Service: Urology;  Laterality: Left;  . SURGERY ON FOOT FOR STAPH INFECTION  1989  . TRANSURETHRAL RESECTION OF BLADDER TUMOR N/A 07/21/2017   Procedure: TRANSURETHRAL RESECTION OF BLADDER TUMOR (TURBT);  Surgeon: Alexis Frock, MD;  Location: [facility];  Service: Urology;  Laterality: N/A;  . TRANSURETHRAL RESECTION OF BLADDER TUMOR WITH MITOMYCIN-C N/A 06/09/2017   Procedure: TRANSURETHRAL RESECTION OF BLADDER TUMOR WITH MITOMYCIN-C;  Surgeon: Alexis Frock, MD;  Location: [facility];  Service: Urology;  Laterality: N/A;  . TUBAL LIGATION  1980    Social History    Tobacco Use  . Smoking status: Never Smoker  . Smokeless tobacco: Never Used  Substance Use Topics  . Alcohol use: Yes    Comment: RARE  . Drug use: No    Family History  Problem Relation Age of Onset  . Cancer Mother   . Hyperlipidemia Father   . Heart attack Father   . Diabetes Brother   . Breast cancer Neg Hx     Allergies  Allergen Reactions  . Adhesive [Tape] Rash    PAPER TAPE ONLY  . Chlorhexidine Gluconate Itching and Rash    Topical antiseptic (CHG WIPES)   . Dilantin [Phenytoin] Rash  . Penicillins Rash    Has patient had a PCN reaction causing immediate rash, facial/tongue/throat swelling, SOB or lightheadedness with hypotension:unsure Has patient had a PCN reaction causing severe rash involving mucus membranes or  skin necrosis:unsure Has patient had a PCN reaction that required hospitalization:No Has patient had a PCN reaction occurring within the last 10 years:No If all of the above answers are "NO", then may proceed with Cephalosporin use.   . Sulfa Antibiotics Rash    Medication list has been reviewed and updated.  Current Outpatient Medications on File Prior to Visit  Medication Sig Dispense Refill  . acetaminophen (TYLENOL) 500 MG tablet Take 1,000 mg by mouth every 6 (six) hours as needed.    . BD PEN NEEDLE NANO U/F 32G X 4 MM MISC USE FOR INSULIN ADMINISTRATION DAILY 100 each 3  . carvedilol (COREG) 6.25 MG tablet Take 6.25 mg by mouth 2 (two) times daily with a meal.    . cetirizine (ZYRTEC) 10 MG tablet Take 10 mg by mouth every morning.     Marland Kitchen LANTUS SOLOSTAR 100 UNIT/ML Solostar Pen     . ONETOUCH DELICA LANCETS 54Y MISC Use lancet to stick finger and check blood sugar two times daily. 100 each 6  . ONETOUCH VERIO test strip Use as directed. 100 each 2  . pravastatin (PRAVACHOL) 40 MG tablet Take 40 mg by mouth every evening.     No current facility-administered medications on file prior to visit.     Review of Systems:  As per HPI-  otherwise negative. She is not fasting today  No real sx of hypoglycemia No fever or chills   Physical Examination: Vitals:   03/21/18 0841  BP: 124/68  Pulse: 65  Resp: 16  SpO2: 98%   Vitals:   03/21/18 0841  Weight: 228 lb (103.4 kg)  Height: 5\' 8"  (1.727 m)   Body mass index is 34.67 kg/m. Ideal Body Weight: Weight in (lb) to have BMI = 25: 164.1  GEN: WDWN, NAD, Non-toxic, A & O x 3, obese, looks well  HEENT: Atraumatic, Normocephalic. Neck supple. No masses, No LAD.  Bilateral TM wnl, oropharynx normal.  PEERL,EOMI.   She also points out a "stuck on" lesion at her hairline right forehead.  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  She would like to freeze off Ears and Nose: No external deformity. CV: RRR, No M/G/R. No JVD. No thrill. No extra heart sounds. PULM: CTA B, no wheezes, crackles, rhonchi. No retractions. No resp. distress. No accessory muscle use. ABD: S, NT, ND, +BS. No rebound. No HSM. EXTR: No c/c/e NEURO Normal gait.  PSYCH: Normally interactive. Conversant. Not depressed or anxious appearing.  Calm demeanor.   VC obtained.  Had patient close eyes and shield her eye with her hand.  Cryotherapy to 42mm seb K on her right forehead at hairline x 3 cycles Assessment and Plan: Controlled type 2 diabetes mellitus with complication, with long-term current use of insulin ([place]) - Plan: Hemoglobin T0P, Basic metabolic panel  Mixed hyperlipidemia  Cancer of renal pelvis, left (HCC)  Encounter for hepatitis C screening test for low risk patient - Plan: Hepatitis C antibody  Estrogen deficiency - Plan: DG Bone Density  Screening for osteoporosis - Plan: DG Bone Density  Seborrheic keratoses  Await A1c and then will consider adjusting her insulin dose or food schedule Continue pravachol Hep C screening Ordered bone density test Cryo therapy as above   Signed Lamar Blinks, MD Received labs, message to pt  Your renal function is not far off from your baseline over  the last year  I'm afraid your A1c has gone up again.   Give your other concerns, we may  need to consider adding short acting insulin to cover meals.  I suspect your blood sugar may be running quite high after you eat dinner.  Do you tend to consume the majority of your calories at dinner?  If so, it could also be part of the problem.  Please check your glucose at various times of day for a week or so and let me know what you get.  If we need to add short acting insulin I will generally have you consult with endocrinology.    For the time being if you want to try going up to 26 units of lantus it would be ok, but watch for any lows  Results for orders placed or performed in visit on 03/21/18  Hemoglobin A1c  Result Value Ref Range   Hgb A1c MFr Bld 9.2 (H) 4.6 - 6.5 %  Basic metabolic panel  Result Value Ref Range   Sodium 137 135 - 145 mEq/L   Potassium 4.8 3.5 - 5.1 mEq/L   Chloride 102 96 - 112 mEq/L   CO2 27 19 - 32 mEq/L   Glucose, Bld 343 (H) 70 - 99 mg/dL   BUN 33 (H) 6 - 23 mg/dL   Creatinine, Ser 1.73 (H) 0.40 - 1.20 mg/dL   Calcium 9.5 8.4 - 10.5 mg/dL   GFR 31.05 (L) >60.00 mL/min

## 2018-03-21 ENCOUNTER — Ambulatory Visit (INDEPENDENT_AMBULATORY_CARE_PROVIDER_SITE_OTHER): Payer: Medicare Other | Admitting: Family Medicine

## 2018-03-21 ENCOUNTER — Encounter: Payer: Self-pay | Admitting: Family Medicine

## 2018-03-21 VITALS — BP 124/68 | HR 65 | Resp 16 | Ht 68.0 in | Wt 228.0 lb

## 2018-03-21 DIAGNOSIS — E118 Type 2 diabetes mellitus with unspecified complications: Secondary | ICD-10-CM | POA: Diagnosis not present

## 2018-03-21 DIAGNOSIS — E782 Mixed hyperlipidemia: Secondary | ICD-10-CM | POA: Diagnosis not present

## 2018-03-21 DIAGNOSIS — Z13820 Encounter for screening for osteoporosis: Secondary | ICD-10-CM

## 2018-03-21 DIAGNOSIS — L821 Other seborrheic keratosis: Secondary | ICD-10-CM

## 2018-03-21 DIAGNOSIS — E2839 Other primary ovarian failure: Secondary | ICD-10-CM | POA: Diagnosis not present

## 2018-03-21 DIAGNOSIS — Z794 Long term (current) use of insulin: Secondary | ICD-10-CM | POA: Diagnosis not present

## 2018-03-21 DIAGNOSIS — C652 Malignant neoplasm of left renal pelvis: Secondary | ICD-10-CM | POA: Diagnosis not present

## 2018-03-21 DIAGNOSIS — Z1159 Encounter for screening for other viral diseases: Secondary | ICD-10-CM | POA: Diagnosis not present

## 2018-03-21 LAB — BASIC METABOLIC PANEL
BUN: 33 mg/dL — ABNORMAL HIGH (ref 6–23)
CALCIUM: 9.5 mg/dL (ref 8.4–10.5)
CO2: 27 mEq/L (ref 19–32)
CREATININE: 1.73 mg/dL — AB (ref 0.40–1.20)
Chloride: 102 mEq/L (ref 96–112)
GFR: 31.05 mL/min — AB (ref >60.00)
GLUCOSE: 343 mg/dL — AB (ref 70–99)
Potassium: 4.8 mEq/L (ref 3.5–5.1)
SODIUM: 137 meq/L (ref 135–145)

## 2018-03-21 LAB — HEMOGLOBIN A1C: HEMOGLOBIN A1C: 9.2 % — AB (ref 4.6–6.5)

## 2018-03-21 NOTE — Patient Instructions (Addendum)
Let's have you mix up the time that you check your blood sugars- perhaps do some checks later in the day at times, a couple of hours after dinner.  I will be in touch with your labs asap At our next check let's do a fasting lipid panel.    The lesion on your forehead will hopefully peel off in the next week or so.  If this does not happen we can re-freeze at your convenience.

## 2018-03-22 LAB — HEPATITIS C ANTIBODY
Hepatitis C Ab: NONREACTIVE
SIGNAL TO CUT-OFF: 0.01 (ref <1.00)

## 2018-03-23 ENCOUNTER — Other Ambulatory Visit (HOSPITAL_BASED_OUTPATIENT_CLINIC_OR_DEPARTMENT_OTHER): Payer: Medicare Other

## 2018-03-30 ENCOUNTER — Ambulatory Visit (HOSPITAL_BASED_OUTPATIENT_CLINIC_OR_DEPARTMENT_OTHER)
Admission: RE | Admit: 2018-03-30 | Discharge: 2018-03-30 | Disposition: A | Discharge status: Home / Self Care | Payer: Medicare Other | Source: Ambulatory Visit | Attending: Family Medicine | Admitting: Family Medicine

## 2018-03-30 ENCOUNTER — Encounter: Payer: Self-pay | Admitting: Family Medicine

## 2018-03-30 DIAGNOSIS — E2839 Other primary ovarian failure: Secondary | ICD-10-CM | POA: Diagnosis not present

## 2018-03-30 DIAGNOSIS — Z13820 Encounter for screening for osteoporosis: Secondary | ICD-10-CM | POA: Diagnosis present

## 2018-04-23 ENCOUNTER — Other Ambulatory Visit: Payer: Self-pay | Admitting: Family Medicine

## 2018-05-11 ENCOUNTER — Other Ambulatory Visit: Payer: Self-pay | Admitting: Family Medicine

## 2018-05-11 DIAGNOSIS — E118 Type 2 diabetes mellitus with unspecified complications: Secondary | ICD-10-CM

## 2018-05-11 DIAGNOSIS — Z794 Long term (current) use of insulin: Principal | ICD-10-CM

## 2018-05-11 MED ORDER — LANTUS SOLOSTAR 100 UNIT/ML ~~LOC~~ SOPN: PEN_INJECTOR | SUBCUTANEOUS | 6 refills | Status: DC

## 2018-05-11 NOTE — Addendum Note (Signed)
Addended by: Lamar Blinks C on: 05/11/2018 08:49 PM   Modules accepted: Orders

## 2018-05-11 NOTE — Telephone Encounter (Signed)
Will route to office for clarification of orders;  lantus solostar refill Last Refill:? Last OV: 03/21/18 PCP:Dr [place]: [facility] [address] [facility] [place]  Original prescription written by historical provider

## 2018-05-11 NOTE — Telephone Encounter (Signed)
Copied from [place] [phone]. Topic: Quick Communication - Rx Refill/Question >> May 11, 2018 10:32 AM Bea Graff, NT wrote: Medication: LANTUS SOLOSTAR 100 UNIT/ML Solostar Pen  Has the patient contacted their pharmacy? Yes.   (Agent: If no, request that the patient contact the pharmacy for the refill.) (Agent: If yes, when and what did the pharmacy advise?)  Preferred Pharmacy (with phone number or street name): [id number] [place], [place] - [address] [phone] (Phone) [phone] (Fax)    Agent: Please be advised that RX refills may take up to 3 business days. We ask that you follow-up with your pharmacy.

## 2018-05-16 ENCOUNTER — Ambulatory Visit (HOSPITAL_COMMUNITY)
Admission: RE | Admit: 2018-05-16 | Discharge: 2018-05-16 | Disposition: A | Discharge status: Home / Self Care | Payer: Medicare Other | Source: Ambulatory Visit | Attending: Urology | Admitting: Urology

## 2018-05-16 ENCOUNTER — Other Ambulatory Visit: Payer: Self-pay | Admitting: Urology

## 2018-05-16 DIAGNOSIS — I700 Atherosclerosis of aorta: Secondary | ICD-10-CM | POA: Insufficient documentation

## 2018-05-16 DIAGNOSIS — C678 Malignant neoplasm of overlapping sites of bladder: Secondary | ICD-10-CM | POA: Diagnosis present

## 2018-05-19 LAB — HM DIABETES EYE EXAM

## 2018-05-31 ENCOUNTER — Telehealth: Payer: Self-pay | Admitting: *Deleted

## 2018-06-07 ENCOUNTER — Other Ambulatory Visit: Payer: Self-pay | Admitting: Family Medicine

## 2018-06-08 ENCOUNTER — Encounter: Payer: Self-pay | Admitting: *Deleted

## 2018-06-08 NOTE — Congregational Nurse Program (Signed)
[id number]/spoke with patient concerning recent md visit about bladder wall mass/ patient states that she has received excellent news and that the mass has not reoccurred since removal.  No further chemo is needed at this point.  Will see diabetes md soon, but glucose is under control. Rhonda Davis,BSN,RN3,CCM

## 2018-06-20 NOTE — Telephone Encounter (Signed)
[id number]/tcf-patient /wondering about s&s of uti, explained the common symptoms and that if her hx if she thought that she was having any burning, itching, or abd pain post urination to call her physician asap. She understood and said that she would stay alert to these symptoms and call if needed.

## 2018-06-23 NOTE — Progress Notes (Addendum)
[facility] [address], [place], [place] [address] [phone]  Date:  06/27/2018   Name:  Tasha Tucker   DOB:  1950-11-05   MRN:  [id number]  PCP:  Darreld Mclean, MD    Chief Complaint: Diabetes (3 month follow up) and Urinary Frequency   History of Present Illness:  Tasha Tucker is a 67 y.o. very pleasant female patient who presents with the following:  Following up today- 3 month check History of DM, HTN, CKD, dyslipidemia, brain aneurysm s/p surgical clip Last seen here in June- I saw her for the first time earlier this year.    From our visit in March: History of kidney cancer/ bladder cancer- she had aLEFTnephrectomy1/18 She was on actos and avandia in the past- ? Thought to be the cause of her kidney cancer Her nephrologist is Dr. Hollie Salk She just started seeing nephrologyafter her nephrectomy as her urologist was concerned about her kidney function numbers Dr.Manny is her urologist, she continues to see him on a regular basis. 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   Give your other concerns, we may need to consider adding  short acting insulin to cover meals.  I suspect your blood sugar may be running quite high after you eat dinner.  Do you tend to consume the majority of your calories at dinner?  If so, it could also be part of the problem.  Please check your glucose at various times of day for a week or so and let me know what you get.  If we need to add short acting insulin I will generally have you consult with endocrinology.   For the time being if you want to try going up to 26 units of lantus it would be ok, but watch for any lows  Over the summer she went to [place] for a service project but this was pretty stressful for her.   She notes that her glucose has been "just awful" but she cannot really determine any pattern.  She is using 26 units of lantus.  Her am glucose is running 130s- 170s generally, a few in the 200s.  She did have one at 16 but did not feel bad She does not generally check later in the day Using oral meds is problematic due to her renal function and cancer history   Check A1c today Flu shot:  done Colonoscopy: never had a colonoscopy. She is willing to try cologuard and I will order it for her  Nephrology visit: June, Dr. Hollie Salk. Stage G3/b.  She cannot use metformin. 6 month follow-up  Urologist is Tresa Moore- saw him 3 weeks ago, he did not make any changes, next CT in a year.  But she will need cysto in 3 month intervals still  She did a cysto 3 weeks ago and thinks that she has a UTI- first noted sx 2 weeks ago. No hematuria  She gave [place] a urine sample today   Coreg lantus Pravastatin  BP Readings from Last 3 Encounters:  06/27/18 130/82  03/21/18 124/68  12/16/17 (!) 145/75    Patient Active Problem List   Diagnosis Date Noted  . Controlled type 2 diabetes mellitus with complication, with long-term current use of insulin ([place]) 03/19/2018  . Hyperlipidemia 01/12/2018  . Cerebral aneurysm 12/29/2017  . Secondary hypertension, unspecified 12/29/2017  . CKD (chronic kidney  disease) stage 4, GFR 15-29 ml/min (HCC) 12/29/2017  . Cancer of renal pelvis, left ([place]) 10/12/2016  . Renal mass 10/09/2016    Past Medical History:  Diagnosis Date  . Bladder cancer ([place])    [place]  . DJD (degenerative joint disease)    NECK, NEEDS PILLOW WHEN LYING FLAT IS POSSIBLE  . History of cerebral aneurysm repair    1996-- S/P CLAPPING RIGHT CAROTID --- NO DEFICITS  . 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 Hyperlipidemia   . Hypertension   . Liver hemangioma   . OA (osteoarthritis)   . PAC (premature atrial contraction)   . PONV (postoperative nausea and vomiting)   . Right trigger finger   . Type 2 diabetes mellitus ([place])   . Wears glasses     Past Surgical History:  Procedure Laterality Date  . ABDOMINAL HYSTERECTOMY  1990   PARTIAL  . CATARACT EXTRACTION W/ INTRAOCULAR LENS  IMPLANT, BILATERAL  2016; 2017  . CEREBRAL ANEURYSM REPAIR  1996   clipped-no deficits RIGHT CAROTID  . CHOLECYSTECTOMY  2010  . CYSTOSCOPY W/ RETROGRADES Right 06/09/2017   Procedure: CYSTOSCOPY WITH RETROGRADE PYELOGRAM;  Surgeon: Alexis Frock, MD;  [facility];  Service: Urology;  Laterality: Right;  . CYSTOSCOPY W/ RETROGRADES Right 07/21/2017   Procedure: CYSTOSCOPY WITH RETROGRADE PYELOGRAM;  Surgeon: Alexis Frock, MD;  Location[facility];  Service: Urology;  Laterality: Right;  . CYSTOSCOPY WITH RETROGRADE PYELOGRAM, URETEROSCOPY AND STENT PLACEMENT Left 08/26/2016   Procedure: CYSTOSCOPY WITH BILATERAL RETROGRADE PYELOGRAM,LEFT URETEROSCOPY WITH BIOPSY AND LEFT STENT PLACEMENT;  Surgeon: Alexis Frock, MD;  Location: [facility];  Service: Urology;  Laterality: Left;  . ROBOT ASSITED LAPAROSCOPIC NEPHROURETERECTOMY Left 10/09/2016   Procedure: XI ROBOT ASSITED LAPAROSCOPIC NEPHROURETERECTOMY;  Surgeon: Alexis Frock,  MD;  Location: [facility];  Service: Urology;  Laterality: Left;  . SURGERY ON FOOT FOR STAPH INFECTION  1989  . TRANSURETHRAL RESECTION OF BLADDER TUMOR N/A 07/21/2017   Procedure: TRANSURETHRAL RESECTION OF BLADDER TUMOR (TURBT);  Surgeon: Alexis Frock, MD;  Location: [facility];  Service: Urology;  Laterality: N/A;  . TRANSURETHRAL RESECTION OF BLADDER TUMOR WITH MITOMYCIN-C N/A 06/09/2017   Procedure: TRANSURETHRAL RESECTION OF BLADDER TUMOR WITH MITOMYCIN-C;  Surgeon: Alexis Frock, MD;  Location: [facility];  Service: Urology;  Laterality: N/A;  . TUBAL LIGATION  1980    Social History   Tobacco Use  . Smoking status: Never Smoker  . Smokeless tobacco: Never Used  Substance Use Topics  . Alcohol use: Yes  Comment: RARE  . Drug use: No    Family History  Problem Relation Age of Onset  . Cancer Mother   . Hyperlipidemia Father   . Heart attack Father   . Diabetes Brother   . Breast cancer Neg Hx     Allergies  Allergen Reactions  . Adhesive [Tape] Rash    PAPER TAPE ONLY  . Chlorhexidine Gluconate Itching and Rash    Topical antiseptic (CHG WIPES)   . Dilantin [Phenytoin] Rash  . Penicillins Rash    Has patient had a PCN reaction causing immediate rash, facial/tongue/throat swelling, SOB or lightheadedness with hypotension:unsure Has patient had a PCN reaction causing severe rash involving mucus membranes or skin necrosis:unsure Has patient had a PCN reaction that required hospitalization:No Has patient had a PCN reaction occurring within the last 10 years:No If all of the above answers are "NO", then may proceed with Cephalosporin use.   . Sulfa Antibiotics Rash    Medication list has been reviewed and updated.  Current Outpatient Medications on File Prior to Visit  Medication Sig Dispense Refill  . acetaminophen (TYLENOL) 500 MG tablet Take 1,000 mg by mouth every 6 (six) hours as needed.    . BD PEN NEEDLE NANO U/F 32G X 4 MM  MISC USE FOR INSULIN ADMINISTRATION DAILY 100 each 3  . carvedilol (COREG) 6.25 MG tablet TAKE 1 TABLET BY MOUTH TWICE A DAY 180 tablet 1  . cetirizine (ZYRTEC) 10 MG tablet Take 10 mg by mouth every morning.     Mar[place] Kitchen GLUCOSAMINE-CHONDROIT-CALCIUM PO     . LANTUS SOLOSTAR 100 UNIT/ML Solostar Pen Inject 24- 26 units subcue daily for diabetes 15 mL 6  . ONETOUCH DELICA LANCETS 37T MISC Use lancet to stick finger and check blood sugar two times daily. 100 each 6  . ONETOUCH VERIO test strip Use as directed. 100 each 2  . pravastatin (PRAVACHOL) 40 MG tablet TAKE 1 TABLET BY MOUTH DAILY 90 tablet 1   No current facility-administered medications on file prior to visit.     Review of Systems:  As per HPI- otherwise negative. Urinary frequency and some intermittent dysuria.  No abd pain, no back pain, no fever    Physical Examination: Vitals:   06/27/18 0907 06/27/18 0931  BP: (!) 159/63 130/82  Pulse: (!) 56   Resp: 16   Temp: 98 F (36.7 C)   SpO2: 99%    Vitals:   06/27/18 0907  Weight: 229 lb 9.6 oz (104.1 kg)  Height: 5\' 8"  (1.727 m)   Body mass index is 34.91 kg/m. Ideal Body Weight: Weight in (lb) to have BMI = 25: 164.1  GEN: WDWN, NAD, Non-toxic, A & O x 3, obese, looks well  HEENT: Atraumatic, Normocephalic. Neck supple. No masses, No LAD.  Bilateral TM wnl, oropharynx normal.  PEERL,EOMI.   Ears and Nose: No external deformity. CV: RRR, No M/G/R. No JVD. No thrill. No extra heart sounds. PULM: CTA B, no wheezes, crackles, rhonchi. No retractions. No resp. distress. No accessory muscle use. ABD: S, NT, ND. No rebound. No HSM.  Belly is benign  EXTR: No c/c/e NEURO Normal gait.  PSYCH: Normally interactive. Conversant. Not depressed or anxious appearing.  Calm demeanor.   Creat clearance is approx 50 per most recent labs  Ok to use cipro up to 500 BID   Results for orders placed or performed in visit on 06/27/18  Urine Culture  Result Value Ref Range   MICRO  NUMBER:  [id number]    SPECIMEN QUALITY: ADEQUATE    Sample Source NOT GIVEN    STATUS: FINAL    ISOLATE 1: Streptococcus agalactiae (A)   Hemoglobin A1c  Result Value Ref Range   Hgb A1c MFr Bld 9.2 (H) 4.6 - 6.5 %  Lipid panel  Result Value Ref Range   Cholesterol 149 0 - 200 mg/dL   Triglycerides 157.0 (H) 0.0 - 149.0 mg/dL   HDL 52.00 >39.00 mg/dL   VLDL 31.4 0.0 - 40.0 mg/dL   LDL Cholesterol 66 0 - 99 mg/dL   Total CHOL/HDL Ratio 3    NonHDL 97.16   CBC  Result Value Ref Range   WBC 5.9 4.0 - 10.5 K/uL   RBC 5.02 3.87 - 5.11 Mil/uL   Platelets 155.0 150.0 - 400.0 K/uL   Hemoglobin 15.3 (H) 12.0 - 15.0 g/dL   HCT 44.9 36.0 - 46.0 %   MCV 89.4 78.0 - 100.0 fl   MCHC 34.0 30.0 - 36.0 g/dL   RDW 13.1 11.5 - 15.5 %  POCT urinalysis dipstick  Result Value Ref Range   Color, UA yellow yellow   Clarity, UA cloudy (A) clear   Glucose, UA negative negative mg/dL   Bilirubin, UA negative negative   Ketones, POC UA negative negative mg/dL   Spec Grav, UA 1.010 1.010 - 1.025   Blood, UA moderate (A) negative   pH, UA     Protein Ur, POC negative negative mg/dL   Urobilinogen, UA negative (A) 0.2 or 1.0 E.U./dL   Nitrite, UA Negative Negative   Leukocytes, UA Small (1+) (A) Negative    Assessment and Plan: Controlled type 2 diabetes mellitus with complication, with long-term current use of insulin (HCC) - Plan: Hemoglobin A1c, CBC, Ambulatory referral to Endocrinology  Mixed hyperlipidemia - Plan: Lipid panel  Chronic renal disease, stage 3, moderately decreased glomerular filtration rate between 30-59 mL/min/1.73 square meter ([place]) - Plan: CBC  Urinary frequency - Plan: Urine Culture, POCT urinalysis dipstick, ciprofloxacin (CIPRO) 250 MG tablet  Screening for colon cancer following up today Await A1c.  Some difficulty with her sugars and we do want her under good control due to her renal issues. If A1c still high plan to refer to endocrinology.?[facility] may be a good fit for  her Continue to see urology and nephrology as she is now Likely UTI post instrumentation. Await culture, start on cipro now.  This is the best choice for her allergy profile and renal function cologuard ordered today   Signed Lamar Blinks, MD  Received her labs 10/1- message to pt Urine culture pending still   Your A1c unfortunately is not better.  With your oral medication restrictions we may need to use mealtime insulin to get things under better control.  I am going to refer you to see endocrinology and will try to find you someone who you like!  In the meantime I think you can safely increase your lantus to 28 units.  Also, if you would please try varying the time of day you take your blood sugar and keep a log- the endocrinologist will like to see this info.    Blood counts are ok, minimally high hemoglobin. We will monitor this Cholesterol is reasonable, continue pravachol Just waiting on your urine culture!  Will be back in touch with this.  I will place an endocrinology referral for you but please alert me if you don't hear anything in a couple of weeks.  Assuming endocrinology is managing  your diabetes, let's plan to meet in 6 months  Results for orders placed or performed in visit on 06/27/18  Urine Culture  Result Value Ref Range   MICRO NUMBER: [id number]    SPECIMEN QUALITY: ADEQUATE    Sample Source NOT GIVEN    STATUS: FINAL    ISOLATE 1: Streptococcus agalactiae (A)   Hemoglobin A1c  Result Value Ref Range   Hgb A1c MFr Bld 9.2 (H) 4.6 - 6.5 %  Lipid panel  Result Value Ref Range   Cholesterol 149 0 - 200 mg/dL   Triglycerides 157.0 (H) 0.0 - 149.0 mg/dL   HDL 52.00 >39.00 mg/dL   VLDL 31.4 0.0 - 40.0 mg/dL   LDL Cholesterol 66 0 - 99 mg/dL   Total CHOL/HDL Ratio 3    NonHDL 97.16   CBC  Result Value Ref Range   WBC 5.9 4.0 - 10.5 K/uL   RBC 5.02 3.87 - 5.11 Mil/uL   Platelets 155.0 150.0 - 400.0 K/uL   Hemoglobin 15.3 (H) 12.0 - 15.0 g/dL   HCT 44.9 36.0  - 46.0 %   MCV 89.4 78.0 - 100.0 fl   MCHC 34.0 30.0 - 36.0 g/dL   RDW 13.1 11.5 - 15.5 %  POCT urinalysis dipstick  Result Value Ref Range   Color, UA yellow yellow   Clarity, UA cloudy (A) clear   Glucose, UA negative negative mg/dL   Bilirubin, UA negative negative   Ketones, POC UA negative negative mg/dL   Spec Grav, UA 1.010 1.010 - 1.025   Blood, UA moderate (A) negative   pH, UA     Protein Ur, POC negative negative mg/dL   Urobilinogen, UA negative (A) 0.2 or 1.0 E.U./dL   Nitrite, UA Negative Negative   Leukocytes, UA Small (1+) (A) Negative   Received her urine culture 10/2 She has a GBS UTI  She is pcn allergic but took keflex in 2017 and tolerated find per her report. However, her sx are now resolved.  She will come in for a repeat urine culture on Monday to make sure clear, ordered for her now  She also wonders if urology can treat her with abx when she has cysto in the future and requests that I send Dr. Tresa Moore a copy of my note from today.   I will do so

## 2018-06-27 ENCOUNTER — Ambulatory Visit (INDEPENDENT_AMBULATORY_CARE_PROVIDER_SITE_OTHER): Payer: Medicare Other | Admitting: Family Medicine

## 2018-06-27 ENCOUNTER — Encounter: Payer: Self-pay | Admitting: Family Medicine

## 2018-06-27 VITALS — BP 130/82 | HR 56 | Temp 98.0°F | Resp 16 | Ht 68.0 in | Wt 229.6 lb

## 2018-06-27 DIAGNOSIS — E118 Type 2 diabetes mellitus with unspecified complications: Secondary | ICD-10-CM | POA: Diagnosis not present

## 2018-06-27 DIAGNOSIS — Z1211 Encounter for screening for malignant neoplasm of colon: Secondary | ICD-10-CM

## 2018-06-27 DIAGNOSIS — E782 Mixed hyperlipidemia: Secondary | ICD-10-CM

## 2018-06-27 DIAGNOSIS — Z794 Long term (current) use of insulin: Secondary | ICD-10-CM

## 2018-06-27 DIAGNOSIS — N183 Chronic kidney disease, stage 3 unspecified: Secondary | ICD-10-CM

## 2018-06-27 DIAGNOSIS — R350 Frequency of micturition: Secondary | ICD-10-CM | POA: Diagnosis not present

## 2018-06-27 LAB — POCT URINALYSIS DIP (MANUAL ENTRY)
BILIRUBIN UA: NEGATIVE mg/dL
Bilirubin, UA: NEGATIVE
GLUCOSE UA: NEGATIVE mg/dL
Nitrite, UA: NEGATIVE
PROTEIN UA: NEGATIVE mg/dL
Spec Grav, UA: 1.01 (ref 1.010–1.025)
Urobilinogen, UA: NEGATIVE E.U./dL — AB

## 2018-06-27 LAB — LIPID PANEL
Cholesterol: 149 mg/dL (ref 0–200)
HDL: 52 mg/dL (ref >39.00)
LDL Cholesterol: 66 mg/dL (ref 0–99)
NONHDL: 97.16
Total CHOL/HDL Ratio: 3
Triglycerides: 157 mg/dL — ABNORMAL HIGH (ref 0.0–149.0)
VLDL: 31.4 mg/dL (ref 0.0–40.0)

## 2018-06-27 LAB — CBC
HEMATOCRIT: 44.9 % (ref 36.0–46.0)
Hemoglobin: 15.3 g/dL — ABNORMAL HIGH (ref 12.0–15.0)
MCHC: 34 g/dL (ref 30.0–36.0)
MCV: 89.4 fl (ref 78.0–100.0)
Platelets: 155 10*3/uL (ref 150.0–400.0)
RBC: 5.02 Mil/uL (ref 3.87–5.11)
RDW: 13.1 % (ref 11.5–15.5)
WBC: 5.9 10*3/uL (ref 4.0–10.5)

## 2018-06-27 LAB — HEMOGLOBIN A1C: HEMOGLOBIN A1C: 9.2 % — AB (ref 4.6–6.5)

## 2018-06-27 MED ORDER — CIPROFLOXACIN HCL 250 MG PO TABS: 250.0000 mg | ORAL_TABLET | Freq: Two times a day (BID) | ORAL | 0 refills | Status: DC

## 2018-06-27 NOTE — Patient Instructions (Signed)
It was good to see you today- I will be in touch with your labs asap We are going to start treatment for UTI while we await your urine culture- cipro twice a day for 5 days If your A1c is not better let's refer to endocrinology

## 2018-06-28 ENCOUNTER — Encounter: Payer: Self-pay | Admitting: Family Medicine

## 2018-06-28 LAB — URINE CULTURE
MICRO NUMBER:: 91170945
SPECIMEN QUALITY:: ADEQUATE

## 2018-06-28 NOTE — Addendum Note (Signed)
Addended by: Lamar Blinks C on: 06/28/2018 03:14 PM   Modules accepted: Orders

## 2018-06-29 ENCOUNTER — Encounter: Payer: Self-pay | Admitting: Family Medicine

## 2018-06-29 NOTE — Addendum Note (Signed)
Addended by: Lamar Blinks C on: 06/29/2018 04:45 PM   Modules accepted: Orders

## 2018-07-01 ENCOUNTER — Other Ambulatory Visit: Payer: Medicare Other

## 2018-07-01 DIAGNOSIS — R350 Frequency of micturition: Secondary | ICD-10-CM

## 2018-07-02 LAB — URINE CULTURE
MICRO NUMBER: 91195759
RESULT: NO GROWTH
SPECIMEN QUALITY:: ADEQUATE

## 2018-07-04 ENCOUNTER — Encounter: Payer: Self-pay | Admitting: Family Medicine

## 2018-07-21 LAB — COLOGUARD: Cologuard: NEGATIVE

## 2018-07-27 LAB — COLOGUARD

## 2018-08-02 ENCOUNTER — Telehealth: Payer: Self-pay | Admitting: *Deleted

## 2018-08-02 NOTE — Telephone Encounter (Signed)
Received Cologuard Results; forwarded to provider/SLS 11/05

## 2018-08-03 ENCOUNTER — Encounter: Payer: Self-pay | Admitting: Family Medicine

## 2018-08-12 ENCOUNTER — Other Ambulatory Visit: Payer: Self-pay | Admitting: Family Medicine

## 2018-10-10 ENCOUNTER — Other Ambulatory Visit: Payer: Self-pay | Admitting: Family Medicine

## 2018-10-12 ENCOUNTER — Encounter: Payer: Medicare Other | Attending: Internal Medicine | Admitting: *Deleted

## 2018-10-12 DIAGNOSIS — Z794 Long term (current) use of insulin: Secondary | ICD-10-CM | POA: Diagnosis present

## 2018-10-12 DIAGNOSIS — E118 Type 2 diabetes mellitus with unspecified complications: Secondary | ICD-10-CM | POA: Diagnosis not present

## 2018-10-14 NOTE — Progress Notes (Signed)
Diabetes Self-Management Education  Visit Type: First/Initial  Appt. Start Time: 1400 Appt. End Time: 3299  10/14/2018  Tasha Tucker, identified by name and date of birth, is a 68 y.o. female with a diagnosis of Diabetes: Type 2. Patient states history of diabetes for the past 30 years. She states history of cancer with her kidney and her GFR is below normal, around 50. She has very routine eating habits, but appears her carb intake is moderate throughout the day.   ASSESSMENT  There were no vitals taken for this visit. There is no height or weight on file to calculate BMI.  Diabetes Self-Management Education - 10/12/18 1405      Visit Information   Visit Type  First/Initial      Initial Visit   Diabetes Type  Type 2    Are you currently following a meal plan?  Yes    What type of meal plan do you follow?  low carb    Are you taking your medications as prescribed?  Yes    Date Diagnosed  30 years ago      Health Coping   How would you rate your overall health?  Good      Psychosocial Assessment   Patient Belief/Attitude about Diabetes  Motivated to manage diabetes    Other persons present  Patient    Patient Concerns  Nutrition/Meal planning;Glycemic Control    Preferred Learning Style  No preference indicated    How often do you need to have someone help you when you read instructions, pamphlets, or other written materials from your doctor or pharmacy?  1 - Never    What is the last grade level you completed in school?  college graduate      Pre-Education Assessment   Patient understands the diabetes disease and treatment process.  Needs Review    Patient understands incorporating nutritional management into lifestyle.  Needs Review    Patient undertands incorporating physical activity into lifestyle.  Needs Review    Patient understands using medications safely.  Needs Review    Patient understands monitoring blood glucose, interpreting and using results  Demonstrates  understanding / competency    Patient understands prevention, detection, and treatment of acute complications.  Needs Review    Patient understands prevention, detection, and treatment of chronic complications.  Needs Review    Patient understands how to develop strategies to address psychosocial issues.  Needs Review      Complications   Last HgB A1C per patient/outside source  7.9 %    How often do you check your blood sugar?  1-2 times/day    Number of hypoglycemic episodes per month  0    Have you had a dilated eye exam in the past 12 months?  Yes    Have you had a dental exam in the past 12 months?  Yes    Are you checking your feet?  Yes    How many days per week are you checking your feet?  7      Dietary Intake   Breakfast  Cheerios x 1 cup with Splenda and a little milk, 2-3 bacon, occasionally mixed nuts    Snack (morning)  no    Lunch  100 calorie bag of popcorn with 17 g carb with 1 scoop sugar free ice cream    Snack (afternoon)  no    Dinner  meat, starch, vegetable, occasionally salad    Snack (evening)  no    Beverage(s)  coffee with fat free half and half, water      Exercise   Exercise Type  Light (walking / raking leaves)    How many days per week to you exercise?  3.5    How many minutes per day do you exercise?  35    Total minutes per week of exercise  122.5      Patient Education   Previous Diabetes Education  Yes (please comment)    Disease state   Factors that contribute to the development of diabetes    Nutrition management   Role of diet in the treatment of diabetes and the relationship between the three main macronutrients and blood glucose level;Food label reading, portion sizes and measuring food.;Carbohydrate counting;Reviewed blood glucose goals for pre and post meals and how to evaluate the patients' food intake on their blood glucose level.    Physical activity and exercise   Role of exercise on diabetes management, blood pressure control and cardiac  health.;Helped patient identify appropriate exercises in relation to his/her diabetes, diabetes complications and other health issue.    Medications  Reviewed patients medication for diabetes, action, purpose, timing of dose and side effects.    Monitoring  Identified appropriate SMBG and/or A1C goals.    Psychosocial adjustment  Role of stress on diabetes      Individualized Goals (developed by patient)   Nutrition  General guidelines for healthy choices and portions discussed    Physical Activity  Exercise 3-5 times per week    Medications  take my medication as prescribed    Monitoring   test my blood glucose as discussed      Outcomes   Expected Outcomes  Demonstrated interest in learning. Expect positive outcomes    Future DMSE  PRN    Program Status  Completed       Individualized Plan for Diabetes Self-Management Training:   Learning Objective:  Patient will have a greater understanding of diabetes self-management. Patient education plan is to attend individual and/or group sessions per assessed needs and concerns.   Plan:   Patient Instructions  Plan:  Continue to spread your carbohydrate containing foods evenly throughout the day Include protein in moderation with your meals and snacks Consider reading food labels for Total Carbohydrate of foods Continue with your activity level by walking for 30-40 minutes several days a week as tolerated Continue checking BG at alternate times per day as directed by MD  Continue taking medication as directed by MD  Expected Outcomes:  Demonstrated interest in learning. Expect positive outcomes  Education material provided: A1C conversion sheet, Meal plan card and Carbohydrate counting sheet  If problems or questions, patient to contact team via:  Phone  Future DSME appointment: PRN

## 2018-10-14 NOTE — Patient Instructions (Signed)
Plan:  Continue to spread your carbohydrate containing foods evenly throughout the day Include protein in moderation with your meals and snacks Consider reading food labels for Total Carbohydrate of foods Continue with your activity level by walking for 30-40 minutes several days a week as tolerated Continue checking BG at alternate times per day as directed by MD  Continue taking medication as directed by MD

## 2018-11-26 ENCOUNTER — Other Ambulatory Visit: Payer: Self-pay | Admitting: Family Medicine

## 2018-11-28 ENCOUNTER — Other Ambulatory Visit (HOSPITAL_COMMUNITY): Payer: Self-pay | Admitting: Urology

## 2018-11-28 ENCOUNTER — Ambulatory Visit (HOSPITAL_COMMUNITY)
Admission: RE | Admit: 2018-11-28 | Discharge: 2018-11-28 | Disposition: A | Discharge status: Home / Self Care | Payer: Medicare Other | Source: Ambulatory Visit | Attending: Urology | Admitting: Urology

## 2018-11-28 DIAGNOSIS — C652 Malignant neoplasm of left renal pelvis: Secondary | ICD-10-CM

## 2018-12-08 ENCOUNTER — Other Ambulatory Visit: Payer: Self-pay | Admitting: Family Medicine

## 2018-12-08 DIAGNOSIS — Z1231 Encounter for screening mammogram for malignant neoplasm of breast: Secondary | ICD-10-CM

## 2018-12-23 ENCOUNTER — Telehealth: Payer: Self-pay

## 2018-12-23 NOTE — Telephone Encounter (Signed)
Copied from [place] [id number]. Topic: General - Other >> Dec 23, 2018 10:55 AM Alanda Slim E wrote: Reason for CRM: Pt returning call/ No one answered office phone after several tries. Pt is ok for webex appt/ Pt doesn't have an camera on laptop or tablet and does not have a iphone / advised Pt to download webex on smart phone and office will call her back to go over/please advise

## 2018-12-23 NOTE — Telephone Encounter (Signed)
Spoke with patinet, able to set her up for webex.

## 2018-12-25 NOTE — Patient Instructions (Addendum)
It was great to talk to you today-  Let's plan to visit again in about 4 months   I sent a prescription for Robaxin 500 mg to your drugstore.  You may take this twice a day as needed for your neck pain.  This can make you drowsy, do not use if you need to drive  I do not anticipate any issue with this medication regarding your kidney concerns.  However I did call your nephrology office, and left word about our plan.  I have asked him to alert me if any problems  Take care

## 2018-12-25 NOTE — Progress Notes (Signed)
[place] at [facility] [address], [place], [place] [address] [phone]  Date:  12/26/2018   Name:  Tasha Tucker   DOB:  07-05-1951   MRN:  [id number]  PCP:  Darreld Mclean, MD    Chief Complaint: No chief complaint on file.   History of Present Illness:  Tasha Tucker is a 68 y.o. very pleasant female patient who presents with the following:  Follow-up visit today- history of DM, hyperlipidemia, CKD, bladder and renal cancer s/p left nephrectomy in 09/2016.  Brain aneurysm which was clipped in the 1990s Last visit with myself in September We are doing a video visit today due to [place] outbreak I identified pt by her name and DOB  She notes that her neck has been stiff and sore - this has been present off and on for years but more problematic the last month or so NKI No change in her bedding or other exacerbating factor that she can think of  She tried a topical lidocaine which did seem to help some  No numbness or tingling in her arms or hands, no weakness of her arms  She is able to move her neck pretty normally- it felt still a couple of weeks ago  No headaches Never had an MRI of her neck  However she was told that she had degenerative changes in her neck when she had MRI of her head in the past  She did try some tylenol in the am, and again at night. This does help at least some, but does not help her to sleep  Right now she has to sleep with an inflatable "airplane" pillow in order to be comfortable  Nephrology- Hollie Salk Urology- [place]: due for BMP,? a1c, microalbumumin Eye exam: UTD Did Cologuard last fall Recommend shingrix once posible- otherwise Immun are UTD   Her last A1c was high so I recommended having her see endocrinology She established with Dr. Garnet Koyanagi and last saw him in December- Per notes A1c was 7.9% in September - this was her last visit  She will be seeing her  endocrinologist in the next week or 2.  She has not yet sure if this would be a virtual or in person visit.  I offered to bring her in for labs, but she prefers to see what her endocrinologist suggests  She is otherwise feeling well, no fever or chills, no cough  Coreg lantus pravastatin   Patient Active Problem List   Diagnosis Date Noted  . Controlled type 2 diabetes mellitus with complication, with long-term current use of insulin ([place]) 03/19/2018  . Hyperlipidemia 01/12/2018  . Cerebral aneurysm 12/29/2017  . Secondary hypertension, unspecified 12/29/2017  . CKD (chronic kidney disease) stage 4, GFR 15-29 ml/min (HCC) 12/29/2017  . Cancer of renal pelvis, left ([place]) 10/12/2016  . Renal mass 10/09/2016    Past Medical History:  Diagnosis Date  . Bladder cancer ([place])    [place]  . DJD (degenerative joint disease)    NECK, NEEDS PILLOW WHEN LYING FLAT IS POSSIBLE  . History of cerebral aneurysm repair    1996-- S/P CLAPPING RIGHT CAROTID --- NO DEFICITS  . 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 Hyperlipidemia   . Hypertension   . Liver hemangioma   . OA (osteoarthritis)   .  PAC (premature atrial contraction)   . PONV (postoperative nausea and vomiting)   . Right trigger finger   . Type 2 diabetes mellitus ([place])   . Wears glasses     Past Surgical History:  Procedure Laterality Date  . ABDOMINAL HYSTERECTOMY  1990   PARTIAL  . CATARACT EXTRACTION W/ INTRAOCULAR LENS  IMPLANT, BILATERAL  2016; 2017  . CEREBRAL ANEURYSM REPAIR  1996   clipped-no deficits RIGHT CAROTID  . CHOLECYSTECTOMY  2010  . CYSTOSCOPY W/ RETROGRADES Right 06/09/2017   Procedure: CYSTOSCOPY WITH RETROGRADE PYELOGRAM;  Surgeon: Alexis Frock, MD;  Location: [facility];  Service: Urology;  Laterality: Right;  . CYSTOSCOPY W/ RETROGRADES Right 07/21/2017   Procedure: CYSTOSCOPY  WITH RETROGRADE PYELOGRAM;  Surgeon: Alexis Frock, MD;  Location: [facility];  Service: Urology;  Laterality: Right;  . CYSTOSCOPY WITH RETROGRADE PYELOGRAM, URETEROSCOPY AND STENT PLACEMENT Left 08/26/2016   Procedure: CYSTOSCOPY WITH BILATERAL RETROGRADE PYELOGRAM,LEFT URETEROSCOPY WITH BIOPSY AND LEFT STENT PLACEMENT;  Surgeon: Alexis Frock, MD;  Location[facility];  Service: Urology;  Laterality: Left;  . ROBOT ASSITED LAPAROSCOPIC NEPHROURETERECTOMY Left 10/09/2016   Procedure: XI ROBOT ASSITED LAPAROSCOPIC NEPHROURETERECTOMY;  Surgeon: Alexis Frock, MD;  Location: [facility];  Service: Urology;  Laterality: Left;  . SURGERY ON FOOT FOR STAPH INFECTION  1989  . TRANSURETHRAL RESECTION OF BLADDER TUMOR N/A 07/21/2017   Procedure: TRANSURETHRAL RESECTION OF BLADDER TUMOR (TURBT);  Surgeon: Alexis Frock, MD;  Location: [facility];  Service: Urology;  Laterality: N/A;  . TRANSURETHRAL RESECTION OF BLADDER TUMOR WITH MITOMYCIN-C N/A 06/09/2017   Procedure: TRANSURETHRAL RESECTION OF BLADDER TUMOR WITH MITOMYCIN-C;  Surgeon: Alexis Frock, MD;  Location: [facility];  Service: Urology;  Laterality: N/A;  . TUBAL LIGATION  1980    Social History   Tobacco Use  . Smoking status: Never Smoker  . Smokeless tobacco: Never Used  Substance Use Topics  . Alcohol use: Yes    Comment: RARE  . Drug use: No    Family History  Problem Relation Age of Onset  . Cancer Mother   . Hyperlipidemia Father   . Heart attack Father   . Diabetes Brother   . Breast cancer Neg Hx     Allergies  Allergen Reactions  . Adhesive [Tape] Rash    PAPER TAPE ONLY  . Chlorhexidine Gluconate Itching and Rash    Topical antiseptic (CHG WIPES)   . Dilantin [Phenytoin] Rash  . Penicillins Rash    Has patient had a PCN reaction causing immediate rash, facial/tongue/throat swelling, SOB or lightheadedness with hypotension:unsure Has patient had a  PCN reaction causing severe rash involving mucus membranes or skin necrosis:unsure Has patient had a PCN reaction that required hospitalization:No Has patient had a PCN reaction occurring within the last 10 years:No If all of the above answers are "NO", then may proceed with Cephalosporin use.   . Sulfa Antibiotics Rash    Medication list has been reviewed and updated.  Current Outpatient Medications on File Prior to Visit  Medication Sig Dispense Refill  . acetaminophen (TYLENOL) 500 MG tablet Take 1,000 mg by mouth every 6 (six) hours as needed.    . BD PEN NEEDLE NANO U/F 32G X 4 MM MISC USE FOR INSULIN ADMINISTRATION DAILY 100 each 3  . carvedilol (COREG) 6.25 MG tablet TAKE 1 TABLET BY MOUTH TWICE A DAY 180 tablet 1  . cetirizine (ZYRTEC) 10 MG tablet Take 10 mg by mouth  every morning.     . ciprofloxacin (CIPRO) 250 MG tablet Take 1 tablet (250 mg total) by mouth 2 (two) times daily. 10 tablet 0  . GLUCOSAMINE-CHONDROIT-CALCIUM PO     . LANTUS SOLOSTAR 100 UNIT/ML Solostar Pen Inject 24- 26 units subcue daily for diabetes 15 mL 6  . ONETOUCH DELICA LANCETS 22E MISC Use lancet to stick finger and check blood sugar two times daily. 100 each 6  . ONETOUCH VERIO test strip USE AS DIRECTED. 300 each 2  . pravastatin (PRAVACHOL) 40 MG tablet TAKE 1 TABLET BY MOUTH DAILY 90 tablet 1   No current facility-administered medications on file prior to visit.     Review of Systems:  As per HPI- otherwise negative. She has not been sick, no fever or chills She did [place] but did not get sick  Physical Examination: There were no vitals filed for this visit. There were no vitals filed for this visit. There is no height or weight on file to calculate BMI. Ideal Body Weight:    Viewed pt over video - she looks well No apparent SOB or tachypnea No rash is visible   She did have an appt with Dr. Tresa Moore about 2 weeks ago,she reports that her BP was approx 130/80 at  that visit. I have not received this note as of yet  Assessment and Plan: Controlled type 2 diabetes mellitus with complication, with long-term current use of insulin (HCC)  Mixed hyperlipidemia  Chronic renal disease, stage 3, moderately decreased glomerular filtration rate between 30-59 mL/min/1.73 square meter (HCC)  Sore neck - Plan: methocarbamol (ROBAXIN) 500 MG tablet  Virtual visit today regarding neck soreness.  We are not able to bring her in for physical exam due to COVID-19 outbreak.  However, it does not sound as though this is a pinched nerve.  I will give her Robaxin, at a conservative dose, to use as needed for pain.  I did call and left a message (spoke with her personally) with her nephrologist's assistant regarding my plan.  I have asked her to call me back if there is any concern with this patient using Robaxin  Otherwise I have asked Suchy to alert me if not feeling better soon.  I did advise her that Robaxin can cause drowsiness.  Signed Lamar Blinks, MD

## 2018-12-26 ENCOUNTER — Ambulatory Visit (INDEPENDENT_AMBULATORY_CARE_PROVIDER_SITE_OTHER): Payer: Medicare Other | Admitting: Family Medicine

## 2018-12-26 ENCOUNTER — Other Ambulatory Visit: Payer: Self-pay

## 2018-12-26 ENCOUNTER — Encounter: Payer: Self-pay | Admitting: Family Medicine

## 2018-12-26 DIAGNOSIS — N183 Chronic kidney disease, stage 3 unspecified: Secondary | ICD-10-CM

## 2018-12-26 DIAGNOSIS — M542 Cervicalgia: Secondary | ICD-10-CM | POA: Diagnosis not present

## 2018-12-26 DIAGNOSIS — E118 Type 2 diabetes mellitus with unspecified complications: Secondary | ICD-10-CM | POA: Diagnosis not present

## 2018-12-26 DIAGNOSIS — E782 Mixed hyperlipidemia: Secondary | ICD-10-CM | POA: Diagnosis not present

## 2018-12-26 DIAGNOSIS — Z794 Long term (current) use of insulin: Secondary | ICD-10-CM

## 2018-12-26 MED ORDER — METHOCARBAMOL 500 MG PO TABS: 500.0000 mg | ORAL_TABLET | Freq: Two times a day (BID) | ORAL | 0 refills | Status: DC | PRN

## 2019-01-12 ENCOUNTER — Other Ambulatory Visit: Payer: Self-pay | Admitting: Family Medicine

## 2019-01-19 ENCOUNTER — Ambulatory Visit: Payer: Medicare Other

## 2019-03-06 ENCOUNTER — Other Ambulatory Visit: Payer: Self-pay | Admitting: Family Medicine

## 2019-03-09 ENCOUNTER — Other Ambulatory Visit: Payer: Self-pay

## 2019-03-09 ENCOUNTER — Ambulatory Visit
Admission: RE | Admit: 2019-03-09 | Discharge: 2019-03-09 | Disposition: A | Discharge status: Home / Self Care | Payer: Medicare Other | Source: Ambulatory Visit | Attending: Family Medicine | Admitting: Family Medicine

## 2019-03-09 DIAGNOSIS — Z1231 Encounter for screening mammogram for malignant neoplasm of breast: Secondary | ICD-10-CM

## 2019-04-21 LAB — BASIC METABOLIC PANEL
CO2: 23 — AB (ref 13–22)
Chloride: 104 (ref 99–108)
Creatinine: 1.4 — AB (ref 0.5–1.1)
Glucose: 142
Potassium: 4.9 (ref 3.4–5.3)
Sodium: 140 (ref 137–147)

## 2019-04-21 LAB — HEPATIC FUNCTION PANEL
ALT: 30 (ref 7–35)
AST: 24 (ref 13–35)
Alkaline Phosphatase: 79 (ref 25–125)
Bilirubin, Total: 0.4

## 2019-04-21 LAB — LIPID PANEL
Cholesterol: 156 (ref 0–200)
HDL: 49 (ref 35–70)
LDL Cholesterol: 77
Triglycerides: 152 (ref 40–160)

## 2019-04-21 LAB — COMPREHENSIVE METABOLIC PANEL
Calcium: 10 (ref 8.7–10.7)
GFR calc non Af Amer: 38

## 2019-04-21 LAB — HEMOGLOBIN A1C: Hemoglobin A1C: 7.8

## 2019-04-30 NOTE — Progress Notes (Signed)
[place] at [facility] [address], [place], [place] [address] [phone]  Date:  05/01/2019   Name:  Tasha Tucker   DOB:  [birth year]   MRN:  [id number]  PCP:  Darreld Mclean, MD    Chief Complaint: Diabetes (3 month follow up)   History of Present Illness:  Tasha Tucker is a 68 y.o. 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 Her nephrologist is Dr. Hollie Salk, urologist is Dr. Tresa Moore, endocrinologist is Dr.Averneni She sees Dr Hollie Salk every 4 months or so Saw endocrinology just last week, her A1c ws 7.8- goal is 7.5 but she is making progress   She is married, has 3 children and 4 grandchildren- [phone] and 55 School is still up the in air for next year; they will be virtual at least at first   Do foot exam today urine micro and A1c- done by endocrinology Eye exam done this year already Lipid panel done recently per endocrinology   She is on the wait list for Shingirx at [facility] - she is trying to get this done  She is walking about 2 miles a day split in 2 sessions No CP, SOB or claudication with exercise   Overall she is feeling well We did a video visit regarding her right sided neck/ trapezius pain in March This does continue to bother her some- she thinks that she could use a massage No numbness or weakness in her arms   Patient Active Problem List   Diagnosis Date Noted  . Controlled type 2 diabetes mellitus with complication, with long-term current use of insulin ([place]) 03/19/2018  . Hyperlipidemia 01/12/2018  . Cerebral aneurysm 12/29/2017  . Secondary hypertension, unspecified 12/29/2017  . CKD  (chronic kidney disease) stage 4, GFR 15-29 ml/min (HCC) 12/29/2017  . Cancer of renal pelvis, left ([place]) 10/12/2016  . Renal mass 10/09/2016    Past Medical History:  Diagnosis Date  . Bladder cancer ([place])    [place]  . DJD (degenerative joint disease)    NECK, NEEDS PILLOW WHEN LYING FLAT IS POSSIBLE  . History of cerebral aneurysm repair    1996-- S/P CLAPPING RIGHT CAROTID --- NO DEFICITS  . 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 Hyperlipidemia   . Hypertension   . Liver hemangioma   . OA (osteoarthritis)   . PAC (premature atrial contraction)   . PONV (postoperative nausea and vomiting)   . Right trigger finger   . Type 2 diabetes mellitus ([place])   . Wears glasses     Past Surgical History:  Procedure Laterality Date  . ABDOMINAL HYSTERECTOMY  1990   PARTIAL  . CATARACT EXTRACTION W/ INTRAOCULAR LENS  IMPLANT, BILATERAL  2016; 2017  . CEREBRAL ANEURYSM REPAIR  1996   clipped-no deficits RIGHT CAROTID  . CHOLECYSTECTOMY  2010  . CYSTOSCOPY W/ RETROGRADES Right 06/09/2017   Procedure: CYSTOSCOPY WITH RETROGRADE PYELOGRAM;  Surgeon: Alexis Frock, MD;  Location: [facility];  Service: Urology;  Laterality: Right;  . CYSTOSCOPY W/ RETROGRADES Right 07/21/2017   Procedure: CYSTOSCOPY WITH RETROGRADE PYELOGRAM;  Surgeon: Alexis Frock, MD;  Location: [facility];  Service: Urology;  Laterality: Right;  . CYSTOSCOPY WITH RETROGRADE PYELOGRAM, URETEROSCOPY AND STENT PLACEMENT Left 08/26/2016   Procedure: CYSTOSCOPY WITH BILATERAL RETROGRADE PYELOGRAM,LEFT URETEROSCOPY WITH BIOPSY AND LEFT STENT PLACEMENT;  Surgeon: Alexis Frock, MD;  Location: [facility];  Service: Urology;  Laterality: Left;  . ROBOT ASSITED LAPAROSCOPIC NEPHROURETERECTOMY Left 10/09/2016   Procedure: XI ROBOT ASSITED LAPAROSCOPIC NEPHROURETERECTOMY;  Surgeon:  Alexis Frock, MD;  Location: [facility];  Service: Urology;  Laterality: Left;  . SURGERY ON FOOT FOR STAPH INFECTION  1989  . TRANSURETHRAL RESECTION OF BLADDER TUMOR N/A 07/21/2017   Procedure: TRANSURETHRAL RESECTION OF BLADDER TUMOR (TURBT);  Surgeon: Alexis Frock, MD;  Location: [facility];  Service: Urology;  Laterality: N/A;  . TRANSURETHRAL RESECTION OF BLADDER TUMOR WITH MITOMYCIN-C N/A 06/09/2017   Procedure: TRANSURETHRAL RESECTION OF BLADDER TUMOR WITH MITOMYCIN-C;  Surgeon: Alexis Frock, MD;  Location: [facility];  Service: Urology;  Laterality: N/A;  . TUBAL LIGATION  1980    Social History   Tobacco Use  . Smoking status: Never Smoker  . Smokeless tobacco: Never Used  Substance Use Topics  . Alcohol use: Yes    Comment: RARE  . Drug use: No    Family History  Problem Relation Age of Onset  . Cancer Mother   . Hyperlipidemia Father   . Heart attack Father   . Diabetes Brother   . Breast cancer Neg Hx     Allergies  Allergen Reactions  . Adhesive [Tape] Rash    PAPER TAPE ONLY  . Chlorhexidine Gluconate Itching and Rash    Topical antiseptic (CHG WIPES)   . Dilantin [Phenytoin] Rash  . Penicillins Rash    Has patient had a PCN reaction causing immediate rash, facial/tongue/throat swelling, SOB or lightheadedness with hypotension:unsure Has patient had a PCN reaction causing severe rash involving mucus membranes or skin necrosis:unsure Has patient had a PCN reaction that required hospitalization:No Has patient had a PCN reaction occurring within the last 10 years:No If all of the above answers are "NO", then may proceed with Cephalosporin use.   . Sulfa Antibiotics Rash    Medication list has been reviewed and updated.  Current Outpatient Medications on File Prior to Visit  Medication Sig Dispense Refill  . acetaminophen (TYLENOL) 500 MG tablet Take 1,000 mg by mouth every 6 (six) hours as needed.    . BD PEN NEEDLE  NANO U/F 32G X 4 MM MISC USE FOR INSULIN ADMINISTRATION DAILY 100 each 3  . carvedilol (COREG) 6.25 MG tablet TAKE 1 TABLET BY MOUTH TWICE A DAY 180 tablet 1  . cetirizine (ZYRTEC) 10 MG tablet Take 10 mg by mouth every morning.     Marland Kitchen GLUCOSAMINE-CHONDROIT-CALCIUM PO     . LANTUS SOLOSTAR 100 UNIT/ML Solostar Pen Inject 24- 26 units subcue daily for diabetes 15 mL 6  . ONETOUCH DELICA LANCETS 62I MISC Use lancet to stick finger and check blood sugar two times daily. 100 each 6  . ONETOUCH VERIO test strip USE AS DIRECTED. 300 each 2  . pravastatin (PRAVACHOL) 40 MG tablet TAKE 1 TABLET BY MOUTH EVERY DAY 90 tablet 1   No current facility-administered medications on file prior to visit.     Review of Systems:  As per HPI- otherwise negative. No fever or chills  No CP or SOB  Physical Examination: Vitals:   05/01/19 0920  BP: 122/74  Pulse:  60  Resp: 16  Temp: 98.6 F (37 C)  SpO2: 98%   There were no vitals filed for this visit. There is no height or weight on file to calculate BMI. Ideal Body Weight:    GEN: WDWN, NAD, Non-toxic, A & O x 3, obese. Looks well  HEENT: Atraumatic, Normocephalic. Neck supple. No masses, No LAD. Ears and Nose: No external deformity. CV: RRR, No M/G/R. No JVD. No thrill. No extra heart sounds. PULM: CTA B, no wheezes, crackles, rhonchi. No retractions. No resp. distress. No accessory muscle use. ABD: S, NT, ND, +BS. No rebound. No HSM. EXTR: No c/c/e NEURO Normal gait.  PSYCH: Normally interactive. Conversant. Not depressed or anxious appearing.  Calm demeanor.  Foot exam- normal except cannot palpate pulse on left She has likely age related kyphosis of her cervical spine. No bony TTP, normal cervical ROM and strength/ sensation of both UE   Assessment and Plan:   ICD-10-CM   1. Physical exam  Z00.00   2. Controlled type 2 diabetes mellitus with complication, with long-term current use of insulin (HCC)  E11.8    Z79.4   3. Sore neck  M54.2    4. Mixed hyperlipidemia  E78.2   5. Chronic renal disease, stage 3, moderately decreased glomerular filtration rate between 30-59 mL/min/1.73 square meter (HCC)  N18.3    Following up for a CPE today Encourage continued exercise shingrix when she can See chiro regarding her neck sx Recent lipids and A1c, CMP per her endocrinologist - labs are UTD Cannot palpate pulse on left- feet are warm and well perfused, no claudication sx She will monitor   Follow-up: No follow-ups on file.  No orders of the defined types were placed in this encounter.  No orders of the defined types were placed in this encounter.   @SIGN @    Signed Lamar Blinks, MD

## 2019-05-01 ENCOUNTER — Other Ambulatory Visit: Payer: Self-pay

## 2019-05-01 ENCOUNTER — Encounter: Payer: Self-pay | Admitting: Family Medicine

## 2019-05-01 ENCOUNTER — Ambulatory Visit (INDEPENDENT_AMBULATORY_CARE_PROVIDER_SITE_OTHER): Payer: Medicare Other | Admitting: Family Medicine

## 2019-05-01 VITALS — BP 122/74 | HR 60 | Temp 98.6°F | Resp 16

## 2019-05-01 DIAGNOSIS — M542 Cervicalgia: Secondary | ICD-10-CM

## 2019-05-01 DIAGNOSIS — E118 Type 2 diabetes mellitus with unspecified complications: Secondary | ICD-10-CM

## 2019-05-01 DIAGNOSIS — N183 Chronic kidney disease, stage 3 unspecified: Secondary | ICD-10-CM

## 2019-05-01 DIAGNOSIS — Z0000 Encounter for general adult medical examination without abnormal findings: Secondary | ICD-10-CM | POA: Diagnosis not present

## 2019-05-01 DIAGNOSIS — Z794 Long term (current) use of insulin: Secondary | ICD-10-CM

## 2019-05-01 DIAGNOSIS — E782 Mixed hyperlipidemia: Secondary | ICD-10-CM

## 2019-05-01 NOTE — Patient Instructions (Addendum)
Great to see you today!  Let's plan to visit in 6 months Continue your excellent efforts with exercise and blood sugar control I would suggest seeing a chiropractor about your neck if covered by your insurance.  If this is not covered please let me know and I will get some neck films for you    Health Maintenance After Age 70 After age 65, you are at a higher risk for certain long-term diseases and infections as well as injuries from falls. Falls are a major cause of broken bones and head injuries in people who are older than age 23. Getting regular preventive care can help to keep you healthy and well. Preventive care includes getting regular testing and making lifestyle changes as recommended by your health care provider. Talk with your health care provider about:  Which screenings and tests you should have. A screening is a test that checks for a disease when you have no symptoms.  A diet and exercise plan that is right for you. What should I know about screenings and tests to prevent falls? Screening and testing are the best ways to find a health problem early. Early diagnosis and treatment give you the best chance of managing medical conditions that are common after age 95. Certain conditions and lifestyle choices may make you more likely to have a fall. Your health care provider may recommend:  Regular vision checks. Poor vision and conditions such as cataracts can make you more likely to have a fall. If you wear glasses, make sure to get your prescription updated if your vision changes.  Medicine review. Work with your health care provider to regularly review all of the medicines you are taking, including over-the-counter medicines. Ask your health care provider about any side effects that may make you more likely to have a fall. Tell your health care provider if any medicines that you take make you feel dizzy or sleepy.  Osteoporosis screening. Osteoporosis is a condition that causes the  bones to get weaker. This can make the bones weak and cause them to break more easily.  Blood pressure screening. Blood pressure changes and medicines to control blood pressure can make you feel dizzy.  Strength and balance checks. Your health care provider may recommend certain tests to check your strength and balance while standing, walking, or changing positions.  Foot health exam. Foot pain and numbness, as well as not wearing proper footwear, can make you more likely to have a fall.  Depression screening. You may be more likely to have a fall if you have a fear of falling, feel emotionally low, or feel unable to do activities that you used to do.  Alcohol use screening. Using too much alcohol can affect your balance and may make you more likely to have a fall. What actions can I take to lower my risk of falls? General instructions  Talk with your health care provider about your risks for falling. Tell your health care provider if: ? You fall. Be sure to tell your health care provider about all falls, even ones that seem minor. ? You feel dizzy, sleepy, or off-balance.  Take over-the-counter and prescription medicines only as told by your health care provider. These include any supplements.  Eat a healthy diet and maintain a healthy weight. A healthy diet includes low-fat dairy products, low-fat (lean) meats, and fiber from whole grains, beans, and lots of fruits and vegetables. Home safety  Remove any tripping hazards, such as rugs, cords, and clutter.  Install safety equipment such as grab bars in bathrooms and safety rails on stairs.  Keep rooms and walkways well-lit. Activity   Follow a regular exercise program to stay fit. This will help you maintain your balance. Ask your health care provider what types of exercise are appropriate for you.  If you need a cane or walker, use it as recommended by your health care provider.  Wear supportive shoes that have nonskid soles.  Lifestyle  Do not drink alcohol if your health care provider tells you not to drink.  If you drink alcohol, limit how much you have: ? 0-1 drink a day for women. ? 0-2 drinks a day for men.  Be aware of how much alcohol is in your drink. In the [place] one drink equals one typical bottle of beer (12 oz), one-half glass of wine (5 oz), or one shot of hard liquor (1 oz).  Do not use any products that contain nicotine or tobacco, such as cigarettes and e-cigarettes. If you need help quitting, ask your health care provider. Summary  Having a healthy lifestyle and getting preventive care can help to protect your health and wellness after age 67.  Screening and testing are the best way to find a health problem early and help you avoid having a fall. Early diagnosis and treatment give you the best chance for managing medical conditions that are more common for people who are older than age 56.  Falls are a major cause of broken bones and head injuries in people who are older than age 19. Take precautions to prevent a fall at home.  Work with your health care provider to learn what changes you can make to improve your health and wellness and to prevent falls. This information is not intended to replace advice given to you by your health care provider. Make sure you discuss any questions you have with your health care provider. Document Released: 07/28/2017 Document Revised: 01/05/2019 Document Reviewed: 07/28/2017 Elsevier Patient Education  2020 [employer].

## 2019-05-02 NOTE — Addendum Note (Signed)
Addended by: Lamar Blinks C on: 05/02/2019 01:05 PM   Modules accepted: Orders

## 2019-05-04 ENCOUNTER — Other Ambulatory Visit: Payer: Self-pay

## 2019-05-04 ENCOUNTER — Ambulatory Visit (HOSPITAL_BASED_OUTPATIENT_CLINIC_OR_DEPARTMENT_OTHER)
Admission: RE | Admit: 2019-05-04 | Discharge: 2019-05-04 | Disposition: A | Discharge status: Home / Self Care | Payer: Medicare Other | Source: Ambulatory Visit | Attending: Family Medicine | Admitting: Family Medicine

## 2019-05-04 DIAGNOSIS — M542 Cervicalgia: Secondary | ICD-10-CM | POA: Diagnosis present

## 2019-05-05 ENCOUNTER — Encounter: Payer: Self-pay | Admitting: Family Medicine

## 2019-05-05 DIAGNOSIS — M542 Cervicalgia: Secondary | ICD-10-CM

## 2019-08-10 ENCOUNTER — Encounter: Payer: Self-pay | Admitting: Family Medicine

## 2019-09-29 ENCOUNTER — Other Ambulatory Visit: Payer: Self-pay | Admitting: Family Medicine

## 2019-09-29 DIAGNOSIS — C50919 Malignant neoplasm of unspecified site of unspecified female breast: Secondary | ICD-10-CM

## 2019-09-29 HISTORY — DX: Malignant neoplasm of unspecified site of unspecified female breast: C50.919

## 2019-10-30 NOTE — Patient Instructions (Addendum)
It was great to see you again today, take care.  Please see me in 6 months for your physical  I think your right great toenail is trying to become slightly ingrown at the medial border.  We will need to watch this to make sure it does not grow under the skin.  After your shower or bath, use your fingers to "free" the edge of the nail from the skin.  You can also try sliding a piece of dental floss under the nail to pull it out from the skin.  I would recommend letting your toenails grow a bit longer so the edge clears the skin.  If this is getting worse you may need podiatry treatment- keep me posted

## 2019-10-30 NOTE — Progress Notes (Signed)
[facility] [employer] [facility] [place], [place], [place] [address] [phone]  Date:  11/01/2019   Name:  Tasha Tucker   DOB:  07/21/1951   MRN:  [id number]  PCP:  Darreld Mclean, MD    Chief Complaint: Diabetes   History of Present Illness:  Tasha Tucker is a 69 y.o. very pleasant female patient who presents with the following:  Here today for 58-month follow-up visit Patient with history of diabetes, hyperlipidemia, stage IV chronic kidney disease, renal and bladder cancer, cerebral aneurysm status post repair, hypertension She had a left nephrectomy in 2018, brain aneurysm clipping in [place], seen in October, 6 month follow-up Urologist- Tresa Moore; seen in January, will request note  Endocrinologist- Avernemi  Last seen by myself for a physical in August At that point she was trying to get shingles vaccine at [facility], was walking about 2 miles a day for exercise She is still keeping this up despite the weather  She has 4 grandchildren ages 09-13-15/17 Married, 3 children  Flu vaccine- done for the season She had her first shingles and first covid shot so far  Eye exam- appt is pending   Reviewed endocrinology note from 10/26/2019; it looks like her A1c went up to 9% They are going to watch diet for 6 more weeks and then consider mealtime insulin  Carvedilol 6.25 twice daily Lantus 24-26 units daily Pravachol  She did get a pedicure 3 or 4 days ago.  Since that time she noticed that the distal portion of her right great toenail may be tender if she hits it on something.  She notes that this nail was cut a bit shorter than she would have liked.  No redness or pus  She gets no symptoms of claudication with exercise  Patient Active Problem List   Diagnosis Date Noted  . Controlled type 2 diabetes mellitus with complication, with long-term current use of insulin ([place]) 03/19/2018  . Hyperlipidemia  01/12/2018  . Cerebral aneurysm 12/29/2017  . Secondary hypertension, unspecified 12/29/2017  . CKD (chronic kidney disease) stage 4, GFR 15-29 ml/min (HCC) 12/29/2017  . Cancer of renal pelvis, left ([place]) 10/12/2016  . Renal mass 10/09/2016    Past Medical History:  Diagnosis Date  . Bladder cancer ([place])    [place]  . DJD (degenerative joint disease)    NECK, NEEDS PILLOW WHEN LYING FLAT IS POSSIBLE  . History of cerebral aneurysm repair    1996-- S/P CLAPPING RIGHT CAROTID --- NO DEFICITS  . 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 Hyperlipidemia   . Hypertension   . Liver hemangioma   . OA (osteoarthritis)   . PAC (premature atrial contraction)   . PONV (postoperative nausea and vomiting)   . Right trigger finger   . Type 2 diabetes mellitus ([place])   . Wears glasses     Past Surgical History:  Procedure Laterality Date  . ABDOMINAL HYSTERECTOMY  1990   PARTIAL  . CATARACT EXTRACTION W/ INTRAOCULAR LENS  IMPLANT, BILATERAL  2016; 2017  . CEREBRAL ANEURYSM REPAIR  1996   clipped-no deficits RIGHT CAROTID  . CHOLECYSTECTOMY  2010  . CYSTOSCOPY W/ RETROGRADES Right 06/09/2017   Procedure: CYSTOSCOPY WITH RETROGRADE PYELOGRAM;  Surgeon: Alexis Frock, MD;  Location: [facility];  Service: Urology;  Laterality: Right;  .  CYSTOSCOPY W/ RETROGRADES Right 07/21/2017   Procedure: CYSTOSCOPY WITH RETROGRADE PYELOGRAM;  Surgeon: Alexis Frock, MD;  Location[facility];  Service: Urology;  Laterality: Right;  . CYSTOSCOPY WITH RETROGRADE PYELOGRAM, URETEROSCOPY AND STENT PLACEMENT Left 08/26/2016   Procedure: CYSTOSCOPY WITH BILATERAL RETROGRADE PYELOGRAM,LEFT URETEROSCOPY WITH BIOPSY AND LEFT STENT PLACEMENT;  Surgeon: Alexis Frock, MD;  Location[facility];  Service: Urology;  Laterality: Left;  . ROBOT ASSITED LAPAROSCOPIC  NEPHROURETERECTOMY Left 10/09/2016   Procedure: XI ROBOT ASSITED LAPAROSCOPIC NEPHROURETERECTOMY;  Surgeon: Alexis Frock, MD;  Location: [facility];  Service: Urology;  Laterality: Left;  . SURGERY ON FOOT FOR STAPH INFECTION  1989  . TRANSURETHRAL RESECTION OF BLADDER TUMOR N/A 07/21/2017   Procedure: TRANSURETHRAL RESECTION OF BLADDER TUMOR (TURBT);  Surgeon: Alexis Frock, MD;  Location: [facility];  Service: Urology;  Laterality: N/A;  . TRANSURETHRAL RESECTION OF BLADDER TUMOR WITH MITOMYCIN-C N/A 06/09/2017   Procedure: TRANSURETHRAL RESECTION OF BLADDER TUMOR WITH MITOMYCIN-C;  Surgeon: Alexis Frock, MD;  Location: [facility];  Service: Urology;  Laterality: N/A;  . TUBAL LIGATION  1980    Social History   Tobacco Use  . Smoking status: Never Smoker  . Smokeless tobacco: Never Used  Substance Use Topics  . Alcohol use: Yes    Comment: RARE  . Drug use: No    Family History  Problem Relation Age of Onset  . Cancer Mother   . Hyperlipidemia Father   . Heart attack Father   . Diabetes Brother   . Breast cancer Neg Hx     Allergies  Allergen Reactions  . Phenytoin Sodium Extended   . Adhesive [Tape] Rash    PAPER TAPE ONLY  . Chlorhexidine Gluconate Itching and Rash    Topical antiseptic (CHG WIPES)   . Dilantin [Phenytoin] Rash  . Penicillins Rash    Has patient had a PCN reaction causing immediate rash, facial/tongue/throat swelling, SOB or lightheadedness with hypotension:unsure Has patient had a PCN reaction causing severe rash involving mucus membranes or skin necrosis:unsure Has patient had a PCN reaction that required hospitalization:No Has patient had a PCN reaction occurring within the last 10 years:No If all of the above answers are "NO", then may proceed with Cephalosporin use.   . Sulfa Antibiotics Rash    Medication list has been reviewed and updated.  Current Outpatient Medications on File Prior to Visit  Medication  Sig Dispense Refill  . acetaminophen (TYLENOL) 500 MG tablet Take 1,000 mg by mouth every 6 (six) hours as needed.    . BD PEN NEEDLE NANO U/F 32G X 4 MM MISC USE FOR INSULIN ADMINISTRATION DAILY 100 each 3  . carvedilol (COREG) 6.25 MG tablet TAKE 1 TABLET BY MOUTH TWICE A DAY 180 tablet 1  . cetirizine (ZYRTEC) 10 MG tablet Take 10 mg by mouth every morning.     Marland Kitchen GLUCOSAMINE-CHONDROIT-CALCIUM PO     . LANTUS SOLOSTAR 100 UNIT/ML Solostar Pen Inject 24- 26 units subcue daily for diabetes 15 mL 6  . ONETOUCH DELICA LANCETS 23J MISC Use lancet to stick finger and check blood sugar two times daily. 100 each 6  . ONETOUCH VERIO test strip USE AS DIRECTED. 300 each 2  . pravastatin (PRAVACHOL) 40 MG tablet TAKE 1 TABLET BY MOUTH EVERY DAY 90 tablet 1   No current facility-administered medications on file prior to visit.    Review of Systems:  As per HPI- otherwise negative.  No fever or  chills Physical Examination: Vitals:   11/01/19 0855  BP: 132/78  Pulse: 65  Resp: 18  Temp: (!) 96.2 F (35.7 C)  SpO2: 98%   Vitals:   11/01/19 0855  Weight: 242 lb (109.8 kg)  Height: 5\' 8"  (1.727 m)   Body mass index is 36.8 kg/m. Ideal Body Weight: Weight in (lb) to have BMI = 25: 164.1  GEN: WDWN, NAD, Non-toxic, A & O x 3, obese, looks well HEENT: Atraumatic, Normocephalic. Neck supple. No masses, No LAD. Ears and Nose: No external deformity. CV: RRR, No M/G/R. No JVD. No thrill. No extra heart sounds. PULM: CTA B, no wheezes, crackles, rhonchi. No retractions. No resp. distress. No accessory muscle use. ABD: S, NT, ND, +BS. No rebound. No HSM. EXTR: No c/c/e NEURO Normal gait.  PSYCH: Normally interactive. Conversant. Not depressed or anxious appearing.  Calm demeanor.  Foot exam: Normal except cannot palpate pulses in either foot And the medial nail border of the right great toenail is not ingrown, but is growing up against the skin and is slightly tender.  I do not see any  sign  Assessment and Plan: Controlled type 2 diabetes mellitus with complication, with long-term current use of insulin (HCC)  Mixed hyperlipidemia - Plan: pravastatin (PRAVACHOL) 40 MG tablet  Ingrowing right great toenail  Follow-up visit today.  Diabetes managed by endocrinology, we discussed potential of adding mealtime insulin today Refilled pravastatin, recent cholesterol panel looked okay Patient prefers to update labs at her physical in August Suspect that she has an early ingrown toenail of the right great toe.  We hope this can be fixed without needing partial nail removal.  Discussed methods of treating this at home, such as freeing the distal edge of the nail from under the skin on a daily basis.  She will watch this closely, will let me know if it is getting worse or if she needs any other help Moderate medical decision making today This visit occurred during the SARS-CoV-2 public health emergency.  Safety protocols were in place, including screening questions prior to the visit, additional usage of staff PPE, and extensive cleaning of exam room while observing appropriate contact time as indicated for disinfecting solutions.    Signed Lamar Blinks, MD

## 2019-10-31 ENCOUNTER — Other Ambulatory Visit: Payer: Self-pay

## 2019-11-01 ENCOUNTER — Encounter: Payer: Self-pay | Admitting: Family Medicine

## 2019-11-01 ENCOUNTER — Other Ambulatory Visit: Payer: Self-pay

## 2019-11-01 ENCOUNTER — Ambulatory Visit (INDEPENDENT_AMBULATORY_CARE_PROVIDER_SITE_OTHER): Payer: Medicare Other | Admitting: Family Medicine

## 2019-11-01 VITALS — BP 132/78 | HR 65 | Temp 96.2°F | Resp 18 | Ht 68.0 in | Wt 242.0 lb

## 2019-11-01 DIAGNOSIS — E118 Type 2 diabetes mellitus with unspecified complications: Secondary | ICD-10-CM | POA: Diagnosis not present

## 2019-11-01 DIAGNOSIS — Z794 Long term (current) use of insulin: Secondary | ICD-10-CM | POA: Diagnosis not present

## 2019-11-01 DIAGNOSIS — E782 Mixed hyperlipidemia: Secondary | ICD-10-CM

## 2019-11-01 DIAGNOSIS — L600 Ingrowing nail: Secondary | ICD-10-CM

## 2019-11-01 MED ORDER — PRAVASTATIN SODIUM 40 MG PO TABS: 40.0000 mg | ORAL_TABLET | Freq: Every day | ORAL | 3 refills | Status: DC

## 2019-12-18 LAB — HM DIABETES EYE EXAM

## 2020-01-29 ENCOUNTER — Other Ambulatory Visit: Payer: Self-pay | Admitting: Family Medicine

## 2020-01-29 DIAGNOSIS — Z1231 Encounter for screening mammogram for malignant neoplasm of breast: Secondary | ICD-10-CM

## 2020-02-14 ENCOUNTER — Telehealth: Payer: Self-pay | Admitting: *Deleted

## 2020-02-14 NOTE — Telephone Encounter (Signed)
Tasha Tucker stated that she has now been placed on 5 units of insulin ac meals, and that her glucose is high in the mornings and normal in the afternoon and evenings.  Asked what she ate for breakfast and she said muffin or a bagel.  We talked about the way that complex carbohydrates can affect the glucose levels and she shuld try to eat less carbs and more protein in the morning and see if that does not help her levels.  If it does not or they continue to rise she is to contact her md for further instructions. [name],BSN,RN3,CCM,CN.

## 2020-03-13 ENCOUNTER — Other Ambulatory Visit: Payer: Self-pay

## 2020-03-13 ENCOUNTER — Ambulatory Visit
Admission: RE | Admit: 2020-03-13 | Discharge: 2020-03-13 | Disposition: A | Discharge status: Home / Self Care | Payer: Medicare Other | Source: Ambulatory Visit | Attending: Family Medicine | Admitting: Family Medicine

## 2020-03-13 DIAGNOSIS — Z1231 Encounter for screening mammogram for malignant neoplasm of breast: Secondary | ICD-10-CM

## 2020-03-15 ENCOUNTER — Other Ambulatory Visit: Payer: Self-pay | Admitting: Family Medicine

## 2020-03-15 DIAGNOSIS — R928 Other abnormal and inconclusive findings on diagnostic imaging of breast: Secondary | ICD-10-CM

## 2020-03-26 ENCOUNTER — Ambulatory Visit
Admission: RE | Admit: 2020-03-26 | Discharge: 2020-03-26 | Disposition: A | Discharge status: Home / Self Care | Payer: Medicare Other | Source: Ambulatory Visit | Attending: Family Medicine | Admitting: Family Medicine

## 2020-03-26 ENCOUNTER — Other Ambulatory Visit (HOSPITAL_COMMUNITY): Payer: Self-pay | Admitting: Urology

## 2020-03-26 ENCOUNTER — Other Ambulatory Visit: Payer: Self-pay

## 2020-03-26 ENCOUNTER — Other Ambulatory Visit: Payer: Self-pay | Admitting: Family Medicine

## 2020-03-26 ENCOUNTER — Ambulatory Visit (HOSPITAL_COMMUNITY)
Admission: RE | Admit: 2020-03-26 | Discharge: 2020-03-26 | Disposition: A | Discharge status: Home / Self Care | Payer: Medicare Other | Source: Ambulatory Visit | Attending: Urology | Admitting: Urology

## 2020-03-26 DIAGNOSIS — C678 Malignant neoplasm of overlapping sites of bladder: Secondary | ICD-10-CM | POA: Diagnosis not present

## 2020-03-26 DIAGNOSIS — N6489 Other specified disorders of breast: Secondary | ICD-10-CM

## 2020-03-26 DIAGNOSIS — R928 Other abnormal and inconclusive findings on diagnostic imaging of breast: Secondary | ICD-10-CM

## 2020-03-27 ENCOUNTER — Other Ambulatory Visit: Payer: Self-pay | Admitting: Family Medicine

## 2020-03-27 DIAGNOSIS — D0591 Unspecified type of carcinoma in situ of right breast: Secondary | ICD-10-CM

## 2020-03-28 ENCOUNTER — Telehealth: Payer: Self-pay | Admitting: Oncology

## 2020-03-28 ENCOUNTER — Encounter: Payer: Self-pay | Admitting: *Deleted

## 2020-03-28 DIAGNOSIS — Z170 Estrogen receptor positive status [ER+]: Secondary | ICD-10-CM | POA: Insufficient documentation

## 2020-03-28 DIAGNOSIS — C50411 Malignant neoplasm of upper-outer quadrant of right female breast: Secondary | ICD-10-CM

## 2020-03-28 NOTE — Telephone Encounter (Signed)
Spoke to patient to confirm morning [facility] appointment for 7/7, packet emailed to patient

## 2020-04-03 ENCOUNTER — Other Ambulatory Visit: Payer: Self-pay | Admitting: Surgery

## 2020-04-03 ENCOUNTER — Encounter: Payer: Self-pay | Admitting: Licensed Clinical Social Worker

## 2020-04-03 ENCOUNTER — Inpatient Hospital Stay: Payer: Medicare Other | Attending: Oncology | Admitting: Oncology

## 2020-04-03 ENCOUNTER — Encounter: Payer: Self-pay | Admitting: Physical Therapy

## 2020-04-03 ENCOUNTER — Inpatient Hospital Stay: Payer: Medicare Other

## 2020-04-03 ENCOUNTER — Ambulatory Visit: Payer: Medicare Other | Attending: Surgery | Admitting: Physical Therapy

## 2020-04-03 ENCOUNTER — Encounter: Payer: Self-pay | Admitting: Oncology

## 2020-04-03 ENCOUNTER — Other Ambulatory Visit: Payer: Self-pay

## 2020-04-03 ENCOUNTER — Encounter: Payer: Self-pay | Admitting: *Deleted

## 2020-04-03 ENCOUNTER — Ambulatory Visit
Admission: RE | Admit: 2020-04-03 | Discharge: 2020-04-03 | Disposition: A | Discharge status: Home / Self Care | Payer: Medicare Other | Source: Ambulatory Visit | Attending: Radiation Oncology | Admitting: Radiation Oncology

## 2020-04-03 VITALS — BP 152/73 | HR 66 | Temp 98.0°F | Resp 20 | Ht 68.5 in | Wt 230.4 lb

## 2020-04-03 DIAGNOSIS — Z170 Estrogen receptor positive status [ER+]: Secondary | ICD-10-CM | POA: Insufficient documentation

## 2020-04-03 DIAGNOSIS — C50411 Malignant neoplasm of upper-outer quadrant of right female breast: Secondary | ICD-10-CM

## 2020-04-03 DIAGNOSIS — Z905 Acquired absence of kidney: Secondary | ICD-10-CM | POA: Diagnosis not present

## 2020-04-03 DIAGNOSIS — Z809 Family history of malignant neoplasm, unspecified: Secondary | ICD-10-CM | POA: Insufficient documentation

## 2020-04-03 DIAGNOSIS — R293 Abnormal posture: Secondary | ICD-10-CM | POA: Insufficient documentation

## 2020-04-03 DIAGNOSIS — C50911 Malignant neoplasm of unspecified site of right female breast: Secondary | ICD-10-CM

## 2020-04-03 DIAGNOSIS — Z90710 Acquired absence of both cervix and uterus: Secondary | ICD-10-CM | POA: Diagnosis not present

## 2020-04-03 DIAGNOSIS — C652 Malignant neoplasm of left renal pelvis: Secondary | ICD-10-CM

## 2020-04-03 LAB — CMP (CANCER CENTER ONLY)
ALT: 30 U/L (ref 0–44)
AST: 25 U/L (ref 15–41)
Albumin: 3.4 g/dL — ABNORMAL LOW (ref 3.5–5.0)
Alkaline Phosphatase: 75 U/L (ref 38–126)
Anion gap: 12 (ref 5–15)
BUN: 34 mg/dL — ABNORMAL HIGH (ref 8–23)
CO2: 20 mmol/L — ABNORMAL LOW (ref 22–32)
Calcium: 9.4 mg/dL (ref 8.9–10.3)
Chloride: 107 mmol/L (ref 98–111)
Creatinine: 1.91 mg/dL — ABNORMAL HIGH (ref 0.44–1.00)
GFR, Est AFR Am: 30 mL/min — ABNORMAL LOW (ref >60)
GFR, Estimated: 26 mL/min — ABNORMAL LOW (ref >60)
Glucose, Bld: 209 mg/dL — ABNORMAL HIGH (ref 70–99)
Potassium: 4.5 mmol/L (ref 3.5–5.1)
Sodium: 139 mmol/L (ref 135–145)
Total Bilirubin: 0.5 mg/dL (ref 0.3–1.2)
Total Protein: 7.2 g/dL (ref 6.5–8.1)

## 2020-04-03 LAB — CBC WITH DIFFERENTIAL (CANCER CENTER ONLY)
Abs Immature Granulocytes: 0.01 10*3/uL (ref 0.00–0.07)
Basophils Absolute: 0.2 10*3/uL — ABNORMAL HIGH (ref 0.0–0.1)
Basophils Relative: 3 %
Eosinophils Absolute: 0.3 10*3/uL (ref 0.0–0.5)
Eosinophils Relative: 4 %
HCT: 43.9 % (ref 36.0–46.0)
Hemoglobin: 14.7 g/dL (ref 12.0–15.0)
Immature Granulocytes: 0 %
Lymphocytes Relative: 28 %
Lymphs Abs: 1.7 10*3/uL (ref 0.7–4.0)
MCH: 30.8 pg (ref 26.0–34.0)
MCHC: 33.5 g/dL (ref 30.0–36.0)
MCV: 91.8 fL (ref 80.0–100.0)
Monocytes Absolute: 0.5 10*3/uL (ref 0.1–1.0)
Monocytes Relative: 8 %
Neutro Abs: 3.5 10*3/uL (ref 1.7–7.7)
Neutrophils Relative %: 57 %
Platelet Count: 174 10*3/uL (ref 150–400)
RBC: 4.78 MIL/uL (ref 3.87–5.11)
RDW: 12.1 % (ref 11.5–15.5)
WBC Count: 6.1 10*3/uL (ref 4.0–10.5)
nRBC: 0 % (ref 0.0–0.2)

## 2020-04-03 LAB — GENETIC SCREENING ORDER

## 2020-04-03 MED ORDER — ANASTROZOLE 1 MG PO TABS
1.0000 mg | ORAL_TABLET | Freq: Every day | ORAL | 4 refills | Status: DC
Start: 2020-04-03 — End: 2021-02-17

## 2020-04-03 NOTE — Therapy (Signed)
[place], [place], [address] Phone: [phone]   Fax:  [phone]  Physical Therapy Evaluation  Patient Details  Name: Tasha Tucker MRN: [id number] Date of Birth: 02/03/1951 Referring Provider (PT): Dr. Coralie Keens   Encounter Date: 04/03/2020   PT End of Session - 04/03/20 1001    Visit Number 1    Number of Visits 2    Date for PT Re-Evaluation 05/29/20    PT Start Time 0926    PT Stop Time 0943   Also saw pt from 1005 to 1019 for a total of 31 minutes   PT Time Calculation (min) 17 min    Activity Tolerance Patient tolerated treatment well    Behavior During Therapy [facility] for tasks assessed/performed           Past Medical History:  Diagnosis Date  . Bladder cancer ([place])    Home  . Breast cancer ([place])   . DJD (degenerative joint disease)    NECK, NEEDS PILLOW WHEN LYING FLAT IS POSSIBLE  . History of cerebral aneurysm repair    1996-- S/P CLAPPING RIGHT CAROTID --- NO DEFICITS  . 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 Hyperlipidemia   . Hypertension   . Liver hemangioma   . OA (osteoarthritis)   . PAC (premature atrial contraction)   . PONV (postoperative nausea and vomiting)   . Right trigger finger   . Type 2 diabetes mellitus ([place])   . Wears glasses     Past Surgical History:  Procedure Laterality Date  . ABDOMINAL HYSTERECTOMY  1990   PARTIAL  . CATARACT EXTRACTION W/ INTRAOCULAR LENS  IMPLANT, BILATERAL  2016; 2017  . CEREBRAL ANEURYSM REPAIR  1996   clipped-no deficits RIGHT CAROTID  . CHOLECYSTECTOMY  2010  . CYSTOSCOPY W/ RETROGRADES Right 06/09/2017   Procedure: CYSTOSCOPY WITH RETROGRADE PYELOGRAM;  Surgeon: Alexis Frock, MD;  Location[facility];  Service: Urology;  Laterality: Right;  . CYSTOSCOPY W/ RETROGRADES Right 07/21/2017    Procedure: CYSTOSCOPY WITH RETROGRADE PYELOGRAM;  Surgeon: Alexis Frock, MD;  Location: [facility];  Service: Urology;  Laterality: Right;  . CYSTOSCOPY WITH RETROGRADE PYELOGRAM, URETEROSCOPY AND STENT PLACEMENT Left 08/26/2016   Procedure: CYSTOSCOPY WITH BILATERAL RETROGRADE PYELOGRAM,LEFT URETEROSCOPY WITH BIOPSY AND LEFT STENT PLACEMENT;  Surgeon: Alexis Frock, MD;  Location: [facility];  Service: Urology;  Laterality: Left;  . ROBOT ASSITED LAPAROSCOPIC NEPHROURETERECTOMY Left 10/09/2016   Procedure: XI ROBOT ASSITED LAPAROSCOPIC NEPHROURETERECTOMY;  Surgeon: Alexis Frock, MD;  Location: [facility];  Service: Urology;  Laterality: Left;  . SURGERY ON FOOT FOR STAPH INFECTION  1989  . TRANSURETHRAL RESECTION OF BLADDER TUMOR N/A 07/21/2017   Procedure: TRANSURETHRAL RESECTION OF BLADDER TUMOR (TURBT);  Surgeon: Alexis Frock, MD;  Location: [facility];  Service: Urology;  Laterality: N/A;  . TRANSURETHRAL RESECTION OF BLADDER TUMOR WITH MITOMYCIN-C N/A 06/09/2017   Procedure: TRANSURETHRAL RESECTION OF BLADDER TUMOR WITH MITOMYCIN-C;  Surgeon: Alexis Frock, MD;  Location: [facility];  Service: Urology;  Laterality: N/A;  . TUBAL LIGATION  1980    There were no vitals filed for this visit.    Subjective Assessment - 04/03/20 0950    Subjective Patient reports she is here today to be seen by her edical team for her newly diagnosed right breast cancer.  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 It measures 1.9 cm and is located in the upper outer quadrant. It is ER/PR positive and HER2 negative with a Ki67 of 5%. She has a history of bladder and renal carcinoma from 06/2017.    Patient Stated Goals reduce lymphedema and learn post op shoulder ROM HEP    Currently in Pain? Yes    Pain Score 3     Pain Location Neck    Pain Orientation Right;Left     Pain Descriptors / Indicators Other (Comment)   Stiffness   Pain Type Chronic pain    Pain Onset More than a month ago    Pain Frequency Intermittent    Aggravating Factors  Unknown    Pain Relieving Factors Stretching              [facility] PT Assessment - 04/03/20 0001      Assessment   Medical Diagnosis right breast cancer    Referring Provider (PT) Dr. Coralie Keens    Onset Date/Surgical Date 03/13/20    Hand Dominance Right    Prior Therapy none      Precautions   Precautions Other (comment)    Precaution Comments active cancer      Restrictions   Weight Bearing Restrictions No      Balance Screen   Has the patient fallen in the past 6 months Yes    How many times? 1   Slipped in shower; not a balance issue   Has the patient had a decrease in activity level because of a fear of falling?  No    Is the patient reluctant to leave their home because of a fear of falling?  No      Home Ecologist residence    Living Arrangements Spouse/significant other    Available Help at Discharge Family      Prior Function   Level of [place] Retired    Leisure She walks a few times/week for 20-30 min      Cognition   Overall Cognitive Status Within Functional Limits for tasks assessed      Posture/Postural Control   Posture/Postural Control Postural limitations    Postural Limitations Rounded Shoulders;Forward head      ROM / Strength   AROM / PROM / Strength AROM;Strength      AROM   AROM Assessment Site Shoulder    Right/Left Shoulder Right;Left    Right Shoulder Extension 50 Degrees    Right Shoulder Flexion 145 Degrees    Right Shoulder ABduction 160 Degrees    Right Shoulder Internal Rotation 76 Degrees    Right Shoulder External Rotation 79 Degrees    Left Shoulder Extension 58 Degrees    Left Shoulder Flexion 138 Degrees    Left Shoulder ABduction 155 Degrees    Left Shoulder Internal Rotation 76  Degrees    Left Shoulder External Rotation 60 Degrees      Strength   Overall Strength Within functional limits for tasks performed             LYMPHEDEMA/ONCOLOGY QUESTIONNAIRE - 04/03/20 0001      Type   Cancer Type right breast cancer      Lymphedema Assessments   Lymphedema Assessments Upper extremities      Right Upper Extremity Lymphedema   10 cm Proximal to Olecranon Process 31.9 cm  Olecranon Process 25.9 cm    10 cm Proximal to Ulnar Styloid Process 24.1 cm    Just Proximal to Ulnar Styloid Process 16.3 cm    Across Hand at [employer] 18.9 cm    At [place] of 2nd Digit 6.7 cm      Left Upper Extremity Lymphedema   10 cm Proximal to Olecranon Process 33.3 cm    Olecranon Process 25.5 cm    10 cm Proximal to Ulnar Styloid Process 24.2 cm    Just Proximal to Ulnar Styloid Process 16.8 cm    Across Hand at [employer] 19.2 cm    At [place] of 2nd Digit 6.6 cm           L-DEX FLOWSHEETS - 04/03/20 1000      L-DEX LYMPHEDEMA SCREENING   Measurement Type Unilateral    L-DEX MEASUREMENT EXTREMITY Upper Extremity    POSITION  Standing    DOMINANT SIDE Right    At Risk Side Right    BASELINE SCORE (UNILATERAL) 4.1           The patient was assessed using the L-Dex machine today to produce a lymphedema index baseline score. The patient will be reassessed on a regular basis (typically every 3 months) to obtain new L-Dex scores. If the score is > 6.5 points away from his/her baseline score indicating onset of subclinical lymphedema, it will be recommended to wear a compression garment for 4 weeks, 12 hours per day and then be reassessed. If the score continues to be > 6.5 points from baseline at reassessment, we will initiate lymphedema treatment. Assessing in this manner has a 95% rate of preventing clinically significant lymphedema.      Katina Dung - 04/03/20 0001    Open a tight or new jar Mild difficulty    Do heavy household chores (wash walls, wash  floors) Mild difficulty    Carry a shopping bag or briefcase No difficulty    Wash your back No difficulty    Use a knife to cut food No difficulty    Recreational activities in which you take some force or impact through your arm, shoulder, or hand (golf, hammering, tennis) Mild difficulty    During the past week, to what extent has your arm, shoulder or hand problem interfered with your normal social activities with family, friends, neighbors, or groups? Not at all    During the past week, to what extent has your arm, shoulder or hand problem limited your work or other regular daily activities Not at all    Arm, shoulder, or hand pain. None    Tingling (pins and needles) in your arm, shoulder, or hand None    Difficulty Sleeping No difficulty    DASH Score 6.82 %            Objective measurements completed on examination: See above findings.        Patient was instructed today in a home exercise program today for post op shoulder range of motion. These included active assist shoulder flexion in sitting, scapular retraction, wall walking with shoulder abduction, and hands behind head external rotation.  She was encouraged to do these twice a day, holding 3 seconds and repeating 5 times when permitted by her physician.           PT Education - 04/03/20 1001    Education Details Lymphedema risk reduction and post op shoulder ROM HEP    Person(s) Educated Patient  Methods Explanation;Demonstration;Handout    Comprehension Returned demonstration;Verbalized understanding               PT Long Term Goals - 04/03/20 1004      PT LONG TERM GOAL #1   Title Patient will dmeonstrate she has regained full shoulder ROM and function post operatively compared to baselines.    Time 8    Period Weeks    Status New    Target Date 05/29/20           Breast Clinic Goals - 04/03/20 1004      Patient will be able to verbalize understanding of pertinent lymphedema risk  reduction practices relevant to her diagnosis specifically related to skin care.   Time 1    Period Days    Status Achieved      Patient will be able to return demonstrate and/or verbalize understanding of the post-op home exercise program related to regaining shoulder range of motion.   Time 1    Period Days    Status Achieved      Patient will be able to verbalize understanding of the importance of attending the postoperative After Breast Cancer Class for further lymphedema risk reduction education and therapeutic exercise.   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 It measures 1.9 cm and is located in the upper outer quadrant. It is ER/PR positive and HER2 negative with a Ki67 of 5%. She has a history of bladder and renal carcinoma from 06/2017. Her multidisciplinary medical team met prior to her assessments to determine a recommended treatment plan. She is planning to have a right lumpectomy and sentinel node biopsy followed by Oncotype testing, radiation, and anti-estrogen therapy. She will benefit from a post op PT reassessment to determine needs and from L-Dex screenings every 3 months to detect subclinical lymphedema.    Stability/Clinical Decision Making Stable/Uncomplicated    Clinical Decision Making Low    Rehab Potential Excellent    PT Frequency --   Eval and 1 f/u visit   PT Treatment/Interventions ADLs/Self Care Home Management;Patient/family education;Therapeutic exercise    PT Next Visit Plan Will reassess 3-4 weeks post op to determine needs    PT Home Exercise Plan Post op shoulder ROM HEP    Consulted and Agree with Plan of Care Patient           Patient will benefit from skilled therapeutic intervention in order to improve the following deficits and impairments:  Decreased knowledge of  precautions, Impaired UE functional use, Pain, Postural dysfunction, Decreased range of motion  Visit Diagnosis: Malignant neoplasm of upper-outer quadrant of right breast in female, estrogen receptor positive (HCC) - Plan: PT plan of care cert/re-cert  Abnormal posture - Plan: PT plan of care cert/re-cert   Patient will follow up at outpatient cancer rehab 3-4 weeks following surgery.  If the patient requires physical therapy at that time, a specific plan will be dictated and sent to the referring physician for approval. The patient was educated today on appropriate basic range of motion exercises to begin post operatively and the importance of attending the After Breast Cancer class following surgery.  Patient was educated today on lymphedema risk reduction practices as it pertains to recommendations that  will benefit the patient immediately following surgery.  She verbalized good understanding.     Problem List Patient Active Problem List   Diagnosis Date Noted  . Malignant neoplasm of upper-outer quadrant of right breast in female, estrogen receptor positive ([place]) 03/28/2020  . Controlled type 2 diabetes mellitus with complication, with long-term current use of insulin ([place]) 03/19/2018  . Hyperlipidemia 01/12/2018  . Cerebral aneurysm 12/29/2017  . Secondary hypertension, unspecified 12/29/2017  . CKD (chronic kidney disease) stage 4, GFR 15-29 ml/min (HCC) 12/29/2017  . Cancer of renal pelvis, left ([place]) 10/12/2016  . Renal mass 10/09/2016   Tasha Tucker, PT 04/03/20 11:17 AM  [place], [place], [address] Phone: [phone]   Fax:  [phone]  Name: Tasha Tucker MRN: [id number] Date of Birth: 02/15/1951

## 2020-04-03 NOTE — Progress Notes (Signed)
Clinical Social Work [facility] Distress Scre[facility] [place]   Patient completed distress screening protocol and scored a 3 on the Psychosocial Distress Thermometer which indicates mild distress. Clinical Social Worker met with patient in [facility] to assess for distress and other psychosocial needs.  Patient stated she was feeling good after meeting with the treatment team and getting more information on her diagnosis and treatment plan.  She is drawing on past experience with medical concerns (kidney issue, brain aneurysm) to cope with this diagnosis. She has good support from family, friends, and her faith community. She lives with her husband, Liliane Channel. No concerns about transportation, food, or finances at this time.  CSW and patient discussed common feeling and emotions when being diagnosed with cancer, and the importance of support during treatment.  CSW informed patient of the support team and support services at [facility].  CSW provided contact information and encouraged patient to call with any questions or concerns.    Distress Screen:   ONCBCN DISTRESS SCREENING 04/03/2020  Screening Type Initial Screening  Distress experienced in past week (1-10) 3  Emotional problem type Adjusting to illness  Information Concerns Type Lack of info about diagnosis;Lack of info about treatment     [place]

## 2020-04-03 NOTE — Progress Notes (Signed)
[place]  [phone] [phone]     ID: Tasha Tucker DOB: 20-Oct-1950  MR#: [id number]  ESP#:[id number]  Patient Care Team: Darreld Mclean, MD as PCP - General (Family Medicine) Alexis Frock, MD as Consulting Physician (Urology) Rockwell Germany, RN as Oncology Nurse Navigator Mauro Kaufmann, RN as Oncology Nurse Navigator Coralie Keens, MD as Consulting Physician (General Surgery) Julies Carmickle, Virgie Dad, MD as Consulting Physician (Oncology) Kyung Rudd, MD as Consulting Physician (Radiation Oncology) Madelon Lips, MD as Consulting Physician (Nephrology) Madelin Rear, MD as Consulting Physician (Endocrinology) Madelon Lips, MD as Consulting Physician (Nephrology) Chauncey Cruel, MD OTHER MD:  CHIEF COMPLAINT: Estrogen receptor positive breast cancer  CURRENT TREATMENT: Definitive surgery pending   HISTORY OF CURRENT ILLNESS: Tasha Tucker had routine screening mammography on 03/13/2020 showing a possible abnormality in the right breast. She underwent right diagnostic mammography with tomography and right breast ultrasonography at [facility] on 03/26/2020 showing: breast density category B; suspicious 1.9 cm mass/distortion in right breast at 9 o'clock; no right axillary lymphadenopathy.  Accordingly that same day, she proceeded to biopsy of the right breast area in question. The pathology from this procedure ([id number]) showed: invasive and in situ mammary carcinoma, grade 2, e-cadherin negative. Prognostic indicators significant for: estrogen receptor, 90% positive and progesterone receptor, 90% positive, both with strong staining intensity. Proliferation marker Ki67 at 5%. HER2 negative by immunohistochemistry (1+).  The patient's subsequent history is as detailed below.   INTERVAL HISTORY: Tasha Tucker was evaluated in the multidisciplinary breast cancer clinic on 04/03/2020. Her case was also presented at the  multidisciplinary breast cancer conference on the same day. At that time a preliminary plan was proposed: Breast MRI, definitive surgery likely breast conserving surgery with sentinel lymph node sampling, Oncotype, adjuvant radiation, antiestrogens  Of note, she has a history of stage pT1, pN0 urothelial carcinoma. She has been under the care of Dr. Tresa Moore for this since 07/2016. She underwent left radical nephrectomy on 10/09/2016 with pathology ([id number]) showing: high grade papillary urothelial carcinoma, focally invading subepithelial connective tissue; ureter, vascular, and all resection margins negative for carcinoma.  She then underwent bladder tumor resection (TURBT) on 06/09/2017 with pathology ([id number]) showing: invasive high grade papillary urothelial carcinoma, invading lamina propria. She has had Mitomycin-C instilations most recently 2018.   REVIEW OF SYSTEMS: There were no specific symptoms leading to the original mammogram, which was routinely scheduled. On the provided questionnaire, Tasha Tucker reports wearing glasses, history of urinary tract/kidney infections (bladder cancer), and type 2 diabetes. She notes she is not currently having any urinary symptoms. The patient denies unusual headaches, visual changes, nausea, vomiting, stiff neck, dizziness, or gait imbalance. There has been no cough, phlegm production, or pleurisy, no chest pain or pressure, and no change in bowel habits. The patient denies fever, rash, bleeding, unexplained fatigue or unexplained weight loss. A detailed review of systems was otherwise entirely negative.   PAST MEDICAL HISTORY: Past Medical History:  Diagnosis Date  . Bladder cancer ([place])    [place]  . Breast cancer ([place])   . DJD (degenerative joint disease)    NECK, NEEDS PILLOW WHEN LYING FLAT IS POSSIBLE  . History of cerebral aneurysm repair    1996-- S/P CLAPPING RIGHT CAROTID --- NO DEFICITS  . History of renal pelvis cancer     10-09-2016  S/P  LEFT RADICAL NEPHROURETERECTOMY -- HIGH GRADE PAPILLARY UROTHELIAL CARCINOMA  . Hyperlipidemia   . Hypertension   . Liver  hemangioma   . OA (osteoarthritis)   . PAC (premature atrial contraction)   . PONV (postoperative nausea and vomiting)   . Right trigger finger   . Type 2 diabetes mellitus ([place])   . Wears glasses     PAST SURGICAL HISTORY: Past Surgical History:  Procedure Laterality Date  . ABDOMINAL HYSTERECTOMY  1990   PARTIAL  . CATARACT EXTRACTION W/ INTRAOCULAR LENS  IMPLANT, BILATERAL  2016; 2017  . CEREBRAL ANEURYSM REPAIR  1996   clipped-no deficits RIGHT CAROTID  . CHOLECYSTECTOMY  2010  . CYSTOSCOPY W/ RETROGRADES Right 06/09/2017   Procedure: CYSTOSCOPY WITH RETROGRADE PYELOGRAM;  Surgeon: Alexis Frock, MD;  Location: [facility];  Service: Urology;  Laterality: Right;  . CYSTOSCOPY W/ RETROGRADES Right 07/21/2017   Procedure: CYSTOSCOPY WITH RETROGRADE PYELOGRAM;  Surgeon: Alexis Frock, MD;  Location[facility];  Service: Urology;  Laterality: Right;  . CYSTOSCOPY WITH RETROGRADE PYELOGRAM, URETEROSCOPY AND STENT PLACEMENT Left 08/26/2016   Procedure: CYSTOSCOPY WITH BILATERAL RETROGRADE PYELOGRAM,LEFT URETEROSCOPY WITH BIOPSY AND LEFT STENT PLACEMENT;  Surgeon: Alexis Frock, MD;  Location: [facility];  Service: Urology;  Laterality: Left;  . ROBOT ASSITED LAPAROSCOPIC NEPHROURETERECTOMY Left 10/09/2016   Procedure: XI ROBOT ASSITED LAPAROSCOPIC NEPHROURETERECTOMY;  Surgeon: Alexis Frock, MD;  Location: [facility];  Service: Urology;  Laterality: Left;  . SURGERY ON FOOT FOR STAPH INFECTION  1989  . TRANSURETHRAL RESECTION OF BLADDER TUMOR N/A 07/21/2017   Procedure: TRANSURETHRAL RESECTION OF BLADDER TUMOR (TURBT);  Surgeon: Alexis Frock, MD;  Location[facility];  Service: Urology;  Laterality: N/A;  . TRANSURETHRAL RESECTION OF BLADDER TUMOR WITH MITOMYCIN-C N/A 06/09/2017    Procedure: TRANSURETHRAL RESECTION OF BLADDER TUMOR WITH MITOMYCIN-C;  Surgeon: Alexis Frock, MD;  Location: [facility];  Service: Urology;  Laterality: N/A;  . TUBAL LIGATION  1980    FAMILY HISTORY: Family History  Problem Relation Age of Onset  . Cancer Mother   . Hyperlipidemia Father   . Heart attack Father   . Diabetes Brother   . Breast cancer Neg Hx    Her father died at age 79 from ASCVD. Her mother died at age 28. She reports her mother had a tumor in her thigh at age 50, possibly a sarcoma. The patient has one brother, with no history of cancer. Her son, Tasha Tucker, had brain cancer (oligodedroglioma, possibly) and is doing well now at age 95 (as of 03/2020).   GYNECOLOGIC HISTORY:  No LMP recorded. Patient has had a hysterectomy. Menarche: 69 years old Age at first live birth: 69 years old [place] P 3 LMP 1990 Contraceptive: used pills for 8 years without issues HRT never used  Hysterectomy? Yes, 1990 BSO? 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 Husband Tasha Tucker is a retired Garment/textile technologist for the [employer]. She lives at home with husband Tasha Tucker and their cat, Zumbrota. Daughter Tasha Tucker, age 30, works as a Careers adviser in [place]. Son Tasha Tucker, age 36, works in Human resources officer in [place]. Son Tasha Tucker, age 31, lives in [place] and was previously a paramedic. Tasha Tucker has 4 grandchildren. She attends a [employer].     ADVANCED DIRECTIVES: In the absence of any documentation to the contrary, the patient's spouse is their HCPOA.    HEALTH MAINTENANCE: Social History   Tobacco Use  . Smoking status: Never Smoker  . Smokeless tobacco: Never Used  Vaping  Use  . Vaping Use: Never used  Substance Use Topics  . Alcohol use: Yes    Comment: RARE  . Drug use: No     Colonoscopy: Cologuard 2020 was negative  PAP: date unknown, s/p hysterectomy  Bone density: 03/2018,  +1.5   Allergies  Allergen Reactions  . Phenytoin Sodium Extended   . Adhesive [Tape] Rash    PAPER TAPE ONLY  . Chlorhexidine Gluconate Itching and Rash    Topical antiseptic (CHG WIPES)   . Dilantin [Phenytoin] Rash  . Penicillins Rash    Has patient had a PCN reaction causing immediate rash, facial/tongue/throat swelling, SOB or lightheadedness with hypotension:unsure Has patient had a PCN reaction causing severe rash involving mucus membranes or skin necrosis:unsure Has patient had a PCN reaction that required hospitalization:No Has patient had a PCN reaction occurring within the last 10 years:No If all of the above answers are "NO", then may proceed with Cephalosporin use.   . Sulfa Antibiotics Rash    Current Outpatient Medications  Medication Sig Dispense Refill  . acetaminophen (TYLENOL) 500 MG tablet Take 1,000 mg by mouth every 6 (six) hours as needed.    . BD PEN NEEDLE NANO U/F 32G X 4 MM MISC USE FOR INSULIN ADMINISTRATION DAILY 100 each 3  . carvedilol (COREG) 6.25 MG tablet TAKE 1 TABLET BY MOUTH TWICE A DAY 180 tablet 1  . cetirizine (ZYRTEC) 10 MG tablet Take 10 mg by mouth every morning.     Marland Kitchen HUMALOG KWIKPEN 100 UNIT/ML KwikPen SMARTSIG:5 Unit(s) SUB-Q 3 Times Daily    . LANTUS SOLOSTAR 100 UNIT/ML Solostar Pen Inject 24- 26 units subcue daily for diabetes 15 mL 6  . ONETOUCH DELICA LANCETS 96G MISC Use lancet to stick finger and check blood sugar two times daily. 100 each 6  . ONETOUCH VERIO test strip USE AS DIRECTED. 300 each 2  . pravastatin (PRAVACHOL) 40 MG tablet Take 1 tablet (40 mg total) by mouth daily. 90 tablet 3  . GLUCOSAMINE-CHONDROIT-CALCIUM PO  (Patient not taking: Reported on 04/03/2020)     No current facility-administered medications for this visit.    OBJECTIVE: White woman in no acute distress  Vitals:   04/03/20 0831  BP: (!) 152/73  Pulse: 66  Resp: 20  Temp: 98 F (36.7 C)  SpO2: 100%     Body mass index is 34.52 kg/m.   Wt  Readings from Last 3 Encounters:  04/03/20 230 lb 6.4 oz (104.5 kg)  11/01/19 242 lb (109.8 kg)  06/27/18 229 lb 9.6 oz (104.1 kg)      ECOG FS:1 - Symptomatic but completely ambulatory  Ocular: Sclerae unicteric, pupils round and equal Ear-nose-throat: Wearing a mask Lymphatic: No cervical or supraclavicular adenopathy Lungs no rales or rhonchi Heart regular rate and rhythm Abd soft, nontender, positive bowel sounds MSK no focal spinal tenderness, no joint edema Neuro: non-focal, well-oriented, appropriate affect Breasts: The right breast is status post recent biopsy.  There is a moderate hematoma.  There are no other findings of concern.  The left breast is benign.  Both axillae are benign.   LAB RESULTS:  CMP     Component Value Date/Time   NA 139 04/03/2020 0746   NA 140 04/21/2019 0000   K 4.5 04/03/2020 0746   CL 107 04/03/2020 0746   CO2 20 (L) 04/03/2020 0746   GLUCOSE 209 (H) 04/03/2020 0746   BUN 34 (H) 04/03/2020 0746   BUN 36 (A) 08/30/2017 0000  CREATININE 1.91 (H) 04/03/2020 0746   CALCIUM 9.4 04/03/2020 0746   PROT 7.2 04/03/2020 0746   ALBUMIN 3.4 (L) 04/03/2020 0746   AST 25 04/03/2020 0746   ALT 30 04/03/2020 0746   ALKPHOS 75 04/03/2020 0746   BILITOT 0.5 04/03/2020 0746   GFRNONAA 26 (L) 04/03/2020 0746   GFRAA 30 (L) 04/03/2020 0746    No results found for: TOTALPROTELP, ALBUMINELP, A1GS, A2GS, BETS, BETA2SER, GAMS, MSPIKE, SPEI  Lab Results  Component Value Date   WBC 6.1 04/03/2020   NEUTROABS 3.5 04/03/2020   HGB 14.7 04/03/2020   HCT 43.9 04/03/2020   MCV 91.8 04/03/2020   PLT 174 04/03/2020    No results found for: LABCA2  No components found for: RKYHCW237  No results for input(s): INR in the last 168 hours.  No results found for: LABCA2  No results found for: SEG315  No results found for: VVO160  No results found for: VPX106  No results found for: CA2729  No components found for: HGQUANT  No results found for: CEA1  / No results found for: CEA1   No results found for: AFPTUMOR  No results found for: CHROMOGRNA  No results found for: KPAFRELGTCHN, LAMBDASER, KAPLAMBRATIO (kappa/lambda light chains)  No results found for: HGBA, HGBA2QUANT, HGBFQUANT, HGBSQUAN (Hemoglobinopathy evaluation)   No results found for: LDH  No results found for: IRON, TIBC, IRONPCTSAT (Iron and TIBC)  No results found for: FERRITIN  Urinalysis    Component Value Date/Time   COLORURINE RED (A) 08/16/2016 0450   APPEARANCEUR CLOUDY (A) 08/16/2016 0450   LABSPEC 1.014 08/16/2016 0450   PHURINE 6.0 08/16/2016 0450   GLUCOSEU NEGATIVE 08/16/2016 0450   HGBUR LARGE (A) 08/16/2016 0450   BILIRUBINUR negative 06/27/2018 0934   KETONESUR negative 06/27/2018 [place] 08/16/2016 0450   PROTEINUR negative 06/27/2018 0934   PROTEINUR 100 (A) 08/16/2016 0450   UROBILINOGEN negative (A) 06/27/2018 0934   UROBILINOGEN 1.0 04/29/2013 1455   NITRITE Negative 06/27/2018 0934   NITRITE NEGATIVE 08/16/2016 0450   LEUKOCYTESUR Small (1+) (A) 06/27/2018 0934     STUDIES: DG Chest 2 View  Result Date: 03/26/2020 CLINICAL DATA:  69 year old female with history of bladder cancer. EXAM: CHEST - 2 VIEW COMPARISON:  Chest x-ray 11/28/2018. FINDINGS: Lung volumes are normal. No consolidative airspace disease. No pleural effusions. No pneumothorax. No pulmonary nodule or mass noted. Pulmonary vasculature and the cardiomediastinal silhouette are within normal limits. Atherosclerosis in the thoracic aorta. IMPRESSION: 1. No radiographic evidence of acute cardiopulmonary disease. 2. Aortic atherosclerosis. Electronically Signed   By: Vinnie Langton M.D.   On: 03/26/2020 12:13   US BREAST LTD UNI RIGHT INC AXILLA  Result Date: 03/26/2020 CLINICAL DATA:  Screening recall for possible right breast asymmetry. EXAM: DIGITAL DIAGNOSTIC UNILATERAL RIGHT MAMMOGRAM WITH TOMO AND CAD; ULTRASOUND RIGHT BREAST LIMITED RIGHT BREAST  ULTRASOUND COMPARISON:  Previous exams. ACR Breast Density Category b: There are scattered areas of fibroglandular density. FINDINGS: Pot compression tomograms were performed over the central to slightly outer right breast. There is persistent area of architectural distortion best visualized on the spot compression CC tomograms. Mammographic images were processed with CAD. Targeted ultrasound of the central to outer right breast was performed. There is an irregular shadowing mass in the right breast at 9 o'clock 3 cm from nipple measuring 1.3 x 1.9 x 1.5 cm. This corresponds well with the distortion seen in the right breast at mammography. No lymphadenopathy seen in the right axilla.  IMPRESSION: Suspicious 1.9 cm mass/distortion in the right breast at the 9 o'clock position. RECOMMENDATION: Ultrasound-guided biopsy of the right breast mass/distortion in the right breast is recommended. This will be scheduled for the patient. I have discussed the findings and recommendations with the patient. If applicable, a reminder letter will be sent to the patient regarding the next appointment. BI-RADS CATEGORY  4: Suspicious. Electronically Signed   By: Everlean Alstrom M.D.   On: 03/26/2020 12:06   MM DIAG BREAST TOMO UNI RIGHT  Result Date: 03/26/2020 CLINICAL DATA:  Screening recall for possible right breast asymmetry. EXAM: DIGITAL DIAGNOSTIC UNILATERAL RIGHT MAMMOGRAM WITH TOMO AND CAD; ULTRASOUND RIGHT BREAST LIMITED RIGHT BREAST ULTRASOUND COMPARISON:  Previous exams. ACR Breast Density Category b: There are scattered areas of fibroglandular density. FINDINGS: Pot compression tomograms were performed over the central to slightly outer right breast. There is persistent area of architectural distortion best visualized on the spot compression CC tomograms. Mammographic images were processed with CAD. Targeted ultrasound of the central to outer right breast was performed. There is an irregular shadowing mass in the  right breast at 9 o'clock 3 cm from nipple measuring 1.3 x 1.9 x 1.5 cm. This corresponds well with the distortion seen in the right breast at mammography. No lymphadenopathy seen in the right axilla. IMPRESSION: Suspicious 1.9 cm mass/distortion in the right breast at the 9 o'clock position. RECOMMENDATION: Ultrasound-guided biopsy of the right breast mass/distortion in the right breast is recommended. This will be scheduled for the patient. I have discussed the findings and recommendations with the patient. If applicable, a reminder letter will be sent to the patient regarding the next appointment. BI-RADS CATEGORY  4: Suspicious. Electronically Signed   By: Everlean Alstrom M.D.   On: 03/26/2020 12:06   MM 3D SCREEN BREAST BILATERAL  Result Date: 03/14/2020 CLINICAL DATA:  Screening. EXAM: DIGITAL SCREENING BILATERAL MAMMOGRAM WITH TOMO AND CAD COMPARISON:  Previous exam(s). ACR Breast Density Category b: There are scattered areas of fibroglandular density. FINDINGS: In the right breast, a possible asymmetry warrants further evaluation. In the left breast, no findings suspicious for malignancy. Images were processed with CAD. IMPRESSION: Further evaluation is suggested for possible asymmetry in the right breast. RECOMMENDATION: Diagnostic mammogram and possibly ultrasound of the right breast. (Code:[id number]) The patient will be contacted regarding the findings, and additional imaging will be scheduled. BI-RADS CATEGORY  0: Incomplete. Need additional imaging evaluation and/or prior mammograms for comparison. Electronically Signed   By: Abelardo Diesel M.D.   On: 03/14/2020 13:35   MM CLIP PLACEMENT RIGHT  Result Date: 03/26/2020 CLINICAL DATA:  Post ultrasound-guided biopsy of a mass/distortion in the right breast at 9 o'clock 3 cm from nipple. EXAM: DIAGNOSTIC RIGHT MAMMOGRAM POST ULTRASOUND BIOPSY COMPARISON:  Previous exams. FINDINGS: Mammographic images were obtained following ultrasound guided biopsy  of a mass/distortion in the right breast at 9 o'clock 3 cm from nipple. The ribbon shaped biopsy marking clip is positioned at the site of the biopsied mass in the right breast at the 9 o'clock position, however is located 2 cm lateral to the initially questioned distortion. IMPRESSION: Ribbon shaped biopsy marking clip is felt to be appropriately positioned at the site of the biopsied mass in the right breast at the 9 o'clock position, however the clip is located 2 cm lateral to the area of initially questioned distortion. Final Assessment: Post Procedure Mammograms for Marker Placement Electronically Signed   By: Everlean Alstrom M.D.   On: 03/26/2020 13:31  [place] RT BREAST BX W LOC DEV 1ST LESION IMG BX SPEC US GUIDE  Addendum Date: 03/27/2020   ADDENDUM REPORT: 03/27/2020 13:34 ADDENDUM: Pathology revealed GRADE II INVASIVE MAMMARY CARCINOMA, MAMMARY CARCINOMA IN SITU of the Right breast, 9 o'clock. This was found to be concordant by Dr. Everlean Alstrom. Pathology results were discussed with the patient by telephone. The patient reported doing well after the biopsy with tenderness at the site. Post biopsy instructions and care were reviewed and questions were answered. The patient was encouraged to call [facility] for any additional concerns. My direct phone number was provided for the patient. The patient was referred to [facility] on April 03, 2020. Recommendation for a bilateral breast MRI given distortion and vague appearance on ultrasound. The patient is scheduled for a Right breast stereotatic guided biopsy on April 04, 2020. Pathology results reported by Terie Purser, RN on 03/27/2020. Electronically Signed   By: Everlean Alstrom M.D.   On: 03/27/2020 13:34   Result Date: 03/27/2020 CLINICAL DATA:  69 year old female with a suspicious mass/distortion in the right breast at 9 o'clock 3 cm from nipple.  EXAM: ULTRASOUND GUIDED RIGHT BREAST CORE NEEDLE BIOPSY COMPARISON:  Previous exam(s). PROCEDURE: I met with the patient and we discussed the procedure of ultrasound-guided biopsy, including benefits and alternatives. We discussed the high likelihood of a successful procedure. We discussed the risks of the procedure, including infection, bleeding, tissue injury, clip migration, and inadequate sampling. Informed written consent was given. The usual time-out protocol was performed immediately prior to the procedure. Lesion quadrant: Lower outer Using sterile technique and 1% Lidocaine as local anesthetic, under direct ultrasound visualization, a 14 gauge spring-loaded device was used to perform biopsy of the mass/distortion in the right breast at 9 o'clock 3 cm from nipple using a lateral to medial approach. At the conclusion of the procedure a ribbon shaped tissue marker clip was deployed into the biopsy cavity. Follow up 2 view mammogram was performed and dictated separately. IMPRESSION: Ultrasound guided biopsy of the mass in the right breast at 9 o'clock 3 cm from nipple. No apparent complications. Electronically Signed: By: Everlean Alstrom M.D. On: 03/26/2020 13:12     ELIGIBLE FOR AVAILABLE RESEARCH PROTOCOL: AET  ASSESSMENT: 69 y.o. [place] woman status post right breast upper outer quadrant biopsy 03/26/2020, for a clinical T1c N0, stage IA invasive lobular carcinoma, E-cadherin negative, grade 2, estrogen and progesterone receptor positive, with an MIB-1 of 5% and no HER-2 amplification  (a) unable to obtain breast MRI due to renal concern with contrast status post nephrectomy  (1) definitive surgery pending  (2) Oncotype to be obtained from the final surgical sample: Chemotherapy not anticipated  (3) adjuvant radiation  (4) anastrozole started 04/03/2020 with anticipation of surgical delays   PLAN: I met today with Tasha Tucker to review her new diagnosis. Specifically we  discussed the biology of her breast cancer, its diagnosis, staging, treatment  options and prognosis. We first reviewed the fact that cancer is not one disease but more than 100 different diseases and that it is important to keep them separate-- otherwise when friends and relatives discuss their own cancer experiences with Tasha Tucker confusion can result. Similarly we explained that if breast cancer spreads to the bone or liver, the patient would not have bone cancer or liver cancer, but breast cancer in the bone and breast cancer in the liver: one cancer  in three places-- not 3 different cancers which otherwise would have to be treated in 3 different ways.  We also discussed the fact that Tasha Tucker has a lobular breast cancer.  These cancers lack E-cadherin and therefore the cells are infiltrated rather than forming well-defined masses.  We are accordingly hard to detect which is the reason we originally proposed an MRI.  The patient was adamantly opposed to this because of concerns regarding contrast.  She understands her may be close or positive margins at the time of surgery but that if this is the case we would go back in again and get them cleared.  We discussed the difference between local and systemic therapy. In terms of loco-regional treatment, lumpectomy plus radiation is equivalent to mastectomy as far as survival is concerned. For this reason, and because the cosmetic results are generally superior, we recommend breast conserving surgery.   We also noted that in terms of sequencing of treatments, whether systemic therapy or surgery is done first does not affect the ultimate outcome.  This is relevant to Tasha Tucker's case since she will be starting antiestrogens preoperatively given possible surgical delays  We then discussed the rationale for systemic therapy. There is some risk that this cancer may have already spread to other parts of her body. Patients frequently ask at this point about bone scans, CAT  scans and PET scans to find out if they have occult breast cancer somewhere else. The problem is that in early stage disease we are much more likely to find false positives then true cancers and this would expose the patient to unnecessary procedures as well as unnecessary radiation. Scans cannot answer the question the patient really would like to know, which is whether she has microscopic disease elsewhere in her body. For those reasons we do not recommend them.  Of course we would proceed to aggressive evaluation of any symptoms that might suggest metastatic disease, but that is not the case here.  Next we went over the options for systemic therapy which are anti-estrogens, anti-HER-2 immunotherapy, and chemotherapy. Tasha Tucker does not meet criteria for anti-HER-2 immunotherapy. She is a good candidate for anti-estrogens.  The question of chemotherapy is more complicated. Chemotherapy is most effective in rapidly growing, aggressive tumors. It is much less effective in slow growing cancers, like Tasha Tucker 's. For that reason we are going to request an Oncotype from the definitive surgical sample, as suggested by NCCN guidelines. That will help [place] make a definitive decision regarding chemotherapy in this case.  However she understands anticipated she will not need chemotherapy to have an excellent prognosis  The patient has a trip planned in September and she does not want to have radiation until after that.  She is considering postponing surgery also until after the trip.  For that reason we are starting anastrozole now.  We discussed the possible toxicities side effects and complications of this agent.  Tasha Tucker understands it does not increase the risk of clotting when compared with placebo and that therefore she does not have to stop it at the time of surgery or during the time of radiation.  Tasha Tucker has a good understanding of the overall plan. She agrees with it. She knows the goal of treatment in her case is cure.  She will call with any problems that may develop before her next visit here.  Total encounter time 65 minutes.Sarajane Jews C. Rayaan Garguilo, MD 04/03/2020 5:36 PM Medical Oncology and Hematology [facility] [address], [place] [address] Tel. [phone]    Fax. [phone]   This document serves as a record of services personally performed by Lurline Del, MD. It was created on his behalf by Wilburn Mylar, a trained medical scribe. The creation of this record is based on the scribe's personal observations and the provider's statements to them.   I, Lurline Del MD, have reviewed the above documentation for accuracy and completeness, and I agree with the above.    *Total Encounter Time as defined by the Centers for Medicare and Medicaid Services includes, in addition to the face-to-face time of a patient visit (documented in the note above) non-face-to-face time: obtaining and reviewing outside history, ordering and reviewing medications, tests or procedures, care coordination (communications with other health care professionals or caregivers) and documentation in the medical record.

## 2020-04-03 NOTE — Patient Instructions (Signed)

## 2020-04-04 ENCOUNTER — Ambulatory Visit
Admission: RE | Admit: 2020-04-04 | Discharge: 2020-04-04 | Disposition: A | Discharge status: Home / Self Care | Payer: Medicare Other | Source: Ambulatory Visit | Attending: Family Medicine | Admitting: Family Medicine

## 2020-04-04 DIAGNOSIS — D0591 Unspecified type of carcinoma in situ of right breast: Secondary | ICD-10-CM

## 2020-04-05 ENCOUNTER — Telehealth: Payer: Self-pay | Admitting: Oncology

## 2020-04-05 ENCOUNTER — Other Ambulatory Visit: Payer: Self-pay | Admitting: Surgery

## 2020-04-05 DIAGNOSIS — C50911 Malignant neoplasm of unspecified site of right female breast: Secondary | ICD-10-CM

## 2020-04-05 NOTE — Telephone Encounter (Signed)
Scheduled appts per 7/7 los. Left voicemail with appt date and time.

## 2020-04-07 NOTE — Progress Notes (Signed)
Radiation Oncology         [phone] ________________________________  Name: Tasha Tucker MRN: [id number]  Date: 04/03/2020  DOB: Feb 10, 1951  JI:RCVELFY, Gay Filler, MD  Copland, Gay Filler, MD     REFERRING PHYSICIAN: Copland, Gay Filler, MD   DIAGNOSIS: The encounter diagnosis was Malignant neoplasm of upper-outer quadrant of right breast in female, estrogen receptor positive ([place]).   HISTORY OF PRESENT ILLNESS::Tasha Tucker is a 69 y.o. female who is seen for an initial consultation visit regarding the patient's diagnosis of right-sided breast cancer.  The patient was found to have an abnormality within the right breast on screening mammogram.  Ultrasound revealed a 1.9 cm tumor at the 9 o'clock position.  No right-sided axillary adenopathy was noted.  A second area which is felt to warrant a biopsy was also seen within the right breast.  Biopsy of the initial area returned positive for grade 2 invasive lobular carcinoma.  The tumor is estrogen receptor positive, progesterone receptor positive, HER-2/neu negative, and TKI staining was 5%.  The patient is scheduled for a second biopsy and potential breast conservation treatment.  I have been asked to see the patient for consideration of adjuvant radiation treatment.    PREVIOUS RADIATION THERAPY: No   PAST MEDICAL HISTORY:  has a past medical history of Bladder cancer ([place]), Breast cancer ([place]), DJD (degenerative joint disease), History of cerebral aneurysm repair, History of renal pelvis cancer, Hyperlipidemia, Hypertension, Liver hemangioma, OA (osteoarthritis), PAC (premature atrial contraction), PONV (postoperative nausea and vomiting), Right trigger finger, Type 2 diabetes mellitus ([place]), and Wears glasses.     PAST SURGICAL HISTORY: Past Surgical History:  Procedure Laterality Date  . ABDOMINAL HYSTERECTOMY  1990   PARTIAL  . CATARACT EXTRACTION W/ INTRAOCULAR LENS  IMPLANT, BILATERAL  2016; 2017  . CEREBRAL ANEURYSM  REPAIR  1996   clipped-no deficits RIGHT CAROTID  . CHOLECYSTECTOMY  2010  . CYSTOSCOPY W/ RETROGRADES Right 06/09/2017   Procedure: CYSTOSCOPY WITH RETROGRADE PYELOGRAM;  Surgeon: Alexis Frock, MD;  [facility];  Service: Urology;  Laterality: Right;  . CYSTOSCOPY W/ RETROGRADES Right 07/21/2017   Procedure: CYSTOSCOPY WITH RETROGRADE PYELOGRAM;  Surgeon: Alexis Frock, MD;  Location: [facility];  Service: Urology;  Laterality: Right;  . CYSTOSCOPY WITH RETROGRADE PYELOGRAM, URETEROSCOPY AND STENT PLACEMENT Left 08/26/2016   Procedure: CYSTOSCOPY WITH BILATERAL RETROGRADE PYELOGRAM,LEFT URETEROSCOPY WITH BIOPSY AND LEFT STENT PLACEMENT;  Surgeon: Alexis Frock, MD;  Location: [facility];  Service: Urology;  Laterality: Left;  . ROBOT ASSITED LAPAROSCOPIC NEPHROURETERECTOMY Left 10/09/2016   Procedure: XI ROBOT ASSITED LAPAROSCOPIC NEPHROURETERECTOMY;  Surgeon: Alexis Frock, MD;  Location: [facility];  Service: Urology;  Laterality: Left;  . SURGERY ON FOOT FOR STAPH INFECTION  1989  . TRANSURETHRAL RESECTION OF BLADDER TUMOR N/A 07/21/2017   Procedure: TRANSURETHRAL RESECTION OF BLADDER TUMOR (TURBT);  Surgeon: Alexis Frock, MD;  Location: [facility];  Service: Urology;  Laterality: N/A;  . TRANSURETHRAL RESECTION OF BLADDER TUMOR WITH MITOMYCIN-C N/A 06/09/2017   Procedure: TRANSURETHRAL RESECTION OF BLADDER TUMOR WITH MITOMYCIN-C;  Surgeon: Alexis Frock, MD;  Location: [facility];  Service: Urology;  Laterality: N/A;  . TUBAL LIGATION  1980     FAMILY HISTORY: family history includes Cancer in her mother; Diabetes in her brother; Heart attack in her father; Hyperlipidemia in her father.   SOCIAL HISTORY:  reports that she has never smoked. She has never used smokeless tobacco. She reports  current alcohol use. She reports that she does not use drugs.   ALLERGIES: Adhesive [tape], Chlorhexidine  gluconate, Dilantin [phenytoin], Penicillins, and Sulfa antibiotics   MEDICATIONS:  Current Outpatient Medications  Medication Sig Dispense Refill  . acetaminophen (TYLENOL) 500 MG tablet Take 500 mg by mouth daily.     Marland Kitchen anastrozole (ARIMIDEX) 1 MG tablet Take 1 tablet (1 mg total) by mouth daily. 90 tablet 4  . BD PEN NEEDLE NANO U/F 32G X 4 MM MISC USE FOR INSULIN ADMINISTRATION DAILY 100 each 3  . carvedilol (COREG) 6.25 MG tablet TAKE 1 TABLET BY MOUTH TWICE A DAY (Patient taking differently: Take 6.25 mg by mouth 2 (two) times daily with a meal. ) 180 tablet 1  . cetirizine (ZYRTEC) 10 MG tablet Take 10 mg by mouth every morning.     Marland Kitchen HUMALOG KWIKPEN 100 UNIT/ML KwikPen Inject 5-6 Units into the skin 3 (three) times daily.     Marland Kitchen LANTUS SOLOSTAR 100 UNIT/ML Solostar Pen Inject 24- 26 units subcue daily for diabetes (Patient taking differently: Inject 15 Units into the skin every evening. ) 15 mL 6  . ONETOUCH DELICA LANCETS 46E MISC Use lancet to stick finger and check blood sugar two times daily. 100 each 6  . ONETOUCH VERIO test strip USE AS DIRECTED. 300 each 2  . pravastatin (PRAVACHOL) 40 MG tablet Take 1 tablet (40 mg total) by mouth daily. 90 tablet 3   No current facility-administered medications for this encounter.     REVIEW OF SYSTEMS:  A 15 point review of systems is documented in the electronic medical record. This was obtained by the nursing staff. However, I reviewed this with the patient to discuss relevant findings and make appropriate changes.  Pertinent items are noted in HPI.    PHYSICAL EXAM:  vitals were not taken for this visit.   ECOG = 1  0 - Asymptomatic (Fully active, able to carry on all predisease activities without restriction)  1 - Symptomatic but completely ambulatory (Restricted in physically strenuous activity but ambulatory and able to carry out work of a light or sedentary nature. For example, light housework, office work)  2 - Symptomatic,  <50% in bed during the day (Ambulatory and capable of all self care but unable to carry out any work activities. Up and about more than 50% of waking hours)  3 - Symptomatic, >50% in bed, but not bedbound (Capable of only limited self-care, confined to bed or chair 50% or more of waking hours)  4 - Bedbound (Completely disabled. Cannot carry on any self-care. Totally confined to bed or chair)  5 - Death   Eustace Pen MM, Creech RH, Tormey DC, et al. [phone]). "Toxicity and response criteria of the [facility]". [place] Oncol. 5 (6): 649-55  Alert, no acute distress    LABORATORY DATA:  Lab Results  Component Value Date   WBC 6.1 04/03/2020   HGB 14.7 04/03/2020   HCT 43.9 04/03/2020   MCV 91.8 04/03/2020   PLT 174 04/03/2020   Lab Results  Component Value Date   NA 139 04/03/2020   K 4.5 04/03/2020   CL 107 04/03/2020   CO2 20 (L) 04/03/2020   Lab Results  Component Value Date   ALT 30 04/03/2020   AST 25 04/03/2020   ALKPHOS 75 04/03/2020   BILITOT 0.5 04/03/2020      RADIOGRAPHY: DG Chest 2 View  Result Date: 03/26/2020 CLINICAL DATA:  69 year old female with  history of bladder cancer. EXAM: CHEST - 2 VIEW COMPARISON:  Chest x-ray 11/28/2018. FINDINGS: Lung volumes are normal. No consolidative airspace disease. No pleural effusions. No pneumothorax. No pulmonary nodule or mass noted. Pulmonary vasculature and the cardiomediastinal silhouette are within normal limits. Atherosclerosis in the thoracic aorta. IMPRESSION: 1. No radiographic evidence of acute cardiopulmonary disease. 2. Aortic atherosclerosis. Electronically Signed   By: Vinnie Langton M.D.   On: 03/26/2020 12:13   US BREAST LTD UNI RIGHT INC AXILLA  Result Date: 03/26/2020 CLINICAL DATA:  Screening recall for possible right breast asymmetry. EXAM: DIGITAL DIAGNOSTIC UNILATERAL RIGHT MAMMOGRAM WITH TOMO AND CAD; ULTRASOUND RIGHT BREAST LIMITED RIGHT BREAST ULTRASOUND COMPARISON:  Previous  exams. ACR Breast Density Category b: There are scattered areas of fibroglandular density. FINDINGS: Pot compression tomograms were performed over the central to slightly outer right breast. There is persistent area of architectural distortion best visualized on the spot compression CC tomograms. Mammographic images were processed with CAD. Targeted ultrasound of the central to outer right breast was performed. There is an irregular shadowing mass in the right breast at 9 o'clock 3 cm from nipple measuring 1.3 x 1.9 x 1.5 cm. This corresponds well with the distortion seen in the right breast at mammography. No lymphadenopathy seen in the right axilla. IMPRESSION: Suspicious 1.9 cm mass/distortion in the right breast at the 9 o'clock position. RECOMMENDATION: Ultrasound-guided biopsy of the right breast mass/distortion in the right breast is recommended. This will be scheduled for the patient. I have discussed the findings and recommendations with the patient. If applicable, a reminder letter will be sent to the patient regarding the next appointment. BI-RADS CATEGORY  4: Suspicious. Electronically Signed   By: Everlean Alstrom M.D.   On: 03/26/2020 12:06   MM DIAG BREAST TOMO UNI RIGHT  Result Date: 03/26/2020 CLINICAL DATA:  Screening recall for possible right breast asymmetry. EXAM: DIGITAL DIAGNOSTIC UNILATERAL RIGHT MAMMOGRAM WITH TOMO AND CAD; ULTRASOUND RIGHT BREAST LIMITED RIGHT BREAST ULTRASOUND COMPARISON:  Previous exams. ACR Breast Density Category b: There are scattered areas of fibroglandular density. FINDINGS: Pot compression tomograms were performed over the central to slightly outer right breast. There is persistent area of architectural distortion best visualized on the spot compression CC tomograms. Mammographic images were processed with CAD. Targeted ultrasound of the central to outer right breast was performed. There is an irregular shadowing mass in the right breast at 9 o'clock 3 cm from  nipple measuring 1.3 x 1.9 x 1.5 cm. This corresponds well with the distortion seen in the right breast at mammography. No lymphadenopathy seen in the right axilla. IMPRESSION: Suspicious 1.9 cm mass/distortion in the right breast at the 9 o'clock position. RECOMMENDATION: Ultrasound-guided biopsy of the right breast mass/distortion in the right breast is recommended. This will be scheduled for the patient. I have discussed the findings and recommendations with the patient. If applicable, a reminder letter will be sent to the patient regarding the next appointment. BI-RADS CATEGORY  4: Suspicious. Electronically Signed   By: Everlean Alstrom M.D.   On: 03/26/2020 12:06   MM 3D SCREEN BREAST BILATERAL  Result Date: 03/14/2020 CLINICAL DATA:  Screening. EXAM: DIGITAL SCREENING BILATERAL MAMMOGRAM WITH TOMO AND CAD COMPARISON:  Previous exam(s). ACR Breast Density Category b: There are scattered areas of fibroglandular density. FINDINGS: In the right breast, a possible asymmetry warrants further evaluation. In the left breast, no findings suspicious for malignancy. Images were processed with CAD. IMPRESSION: Further evaluation is suggested for possible asymmetry in  the right breast. RECOMMENDATION: Diagnostic mammogram and possibly ultrasound of the right breast. (Code:[id number]) The patient will be contacted regarding the findings, and additional imaging will be scheduled. BI-RADS CATEGORY  0: Incomplete. Need additional imaging evaluation and/or prior mammograms for comparison. Electronically Signed   By: Abelardo Diesel M.D.   On: 03/14/2020 13:35   MM CLIP PLACEMENT RIGHT  Result Date: 04/04/2020 CLINICAL DATA:  Status post stereotactic biopsy for architectural distortion in the lower outer quadrant the RIGHT breast. EXAM: DIAGNOSTIC RIGHT MAMMOGRAM POST STEREOTACTIC BIOPSY COMPARISON:  Previous exam(s). FINDINGS: Mammographic images were obtained following stereotactic guided biopsy of architectural distortion  in the outer RIGHT breast. The biopsy marking clip is displaced approximately 2 cm inferior to the targeted distortion. IMPRESSION: 1. Coil shaped clip is displaced approximately 2 cm inferior to the architectural distortion targeted for today's stereotactic biopsy. 2. A ribbon shaped clip corresponds to the site of a previous ultrasound-guided biopsy which revealed invasive and in-situ carcinoma. Final Assessment: Post Procedure Mammograms for Marker Placement Electronically Signed   By: Franki Cabot M.D.   On: 04/04/2020 09:24   MM CLIP PLACEMENT RIGHT  Result Date: 03/26/2020 CLINICAL DATA:  Post ultrasound-guided biopsy of a mass/distortion in the right breast at 9 o'clock 3 cm from nipple. EXAM: DIAGNOSTIC RIGHT MAMMOGRAM POST ULTRASOUND BIOPSY COMPARISON:  Previous exams. FINDINGS: Mammographic images were obtained following ultrasound guided biopsy of a mass/distortion in the right breast at 9 o'clock 3 cm from nipple. The ribbon shaped biopsy marking clip is positioned at the site of the biopsied mass in the right breast at the 9 o'clock position, however is located 2 cm lateral to the initially questioned distortion. IMPRESSION: Ribbon shaped biopsy marking clip is felt to be appropriately positioned at the site of the biopsied mass in the right breast at the 9 o'clock position, however the clip is located 2 cm lateral to the area of initially questioned distortion. Final Assessment: Post Procedure Mammograms for Marker Placement Electronically Signed   By: Everlean Alstrom M.D.   On: 03/26/2020 13:31   MM RT BREAST BX W LOC DEV 1ST LESION IMAGE BX SPEC STEREO GUIDE  Addendum Date: 04/05/2020   ADDENDUM REPORT: 04/05/2020 13:35 ADDENDUM: Pathology revealed GRADE II INVASIVE LOBULAR CARCINOMA of the Right breast, lower outer quadrant. This was found to be concordant by Dr. Franki Cabot. Pathology results were discussed with the patient by telephone. The patient reported doing well after the biopsy  with tenderness at the site. Post biopsy instructions and care were reviewed and questions were answered. The patient was encouraged to call [facility] for any additional concerns. My direct phone number was provided for the patient. The patient has a recent diagnosis of Right breast cancer and should follow her outlined treatment plan. Dr. Nedra Hai was notified of biopsy results via EPIC message on April 05, 2020. Pathology results reported by Terie Purser, RN on 04/05/2020. Electronically Signed   By: Franki Cabot M.D.   On: 04/05/2020 13:35   Result Date: 04/05/2020 CLINICAL DATA:  Patient presents today for stereotactic biopsy of architectural distortion in the lower outer quadrant of the RIGHT breast. Recently diagnosed invasive mammary carcinoma in the RIGHT breast at the 9 o'clock axis. EXAM: RIGHT BREAST STEREOTACTIC CORE NEEDLE BIOPSY COMPARISON:  Previous exams. FINDINGS: The patient and I discussed the procedure of stereotactic-guided biopsy including benefits and alternatives. We discussed the high likelihood of a successful procedure. We discussed the risks of the procedure  including infection, bleeding, tissue injury, clip migration, and inadequate sampling. Informed written consent was given. The usual time out protocol was performed immediately prior to the procedure. Using sterile technique and 1% Lidocaine as local anesthetic, under stereotactic guidance, a 9 gauge vacuum assisted device was used to perform core needle biopsy of architectural distortion within the lower outer quadrant of the RIGHT breast using a superior approach. Lesion quadrant: Lower outer quadrant At the conclusion of the procedure, coil shaped tissue marker clip was deployed into the biopsy cavity. Follow-up 2-view mammogram was performed and dictated separately. IMPRESSION: Stereotactic-guided biopsy of architectural distortion within the lower outer quadrant of the RIGHT breast. No apparent  complications. Electronically Signed: By: Franki Cabot M.D. On: 04/04/2020 09:15   [place] RT BREAST BX W LOC DEV 1ST LESION IMG BX SPEC US GUIDE  Addendum Date: 03/27/2020   ADDENDUM REPORT: 03/27/2020 13:34 ADDENDUM: Pathology revealed GRADE II INVASIVE MAMMARY CARCINOMA, MAMMARY CARCINOMA IN SITU of the Right breast, 9 o'clock. This was found to be concordant by Dr. Everlean Alstrom. Pathology results were discussed with the patient by telephone. The patient reported doing well after the biopsy with tenderness at the site. Post biopsy instructions and care were reviewed and questions were answered. The patient was encouraged to call [facility] for any additional concerns. My direct phone number was provided for the patient. The patient was referred to [facility] on April 03, 2020. Recommendation for a bilateral breast MRI given distortion and vague appearance on ultrasound. The patient is scheduled for a Right breast stereotatic guided biopsy on April 04, 2020. Pathology results reported by Terie Purser, RN on 03/27/2020. Electronically Signed   By: Everlean Alstrom M.D.   On: 03/27/2020 13:34   Result Date: 03/27/2020 CLINICAL DATA:  69 year old female with a suspicious mass/distortion in the right breast at 9 o'clock 3 cm from nipple. EXAM: ULTRASOUND GUIDED RIGHT BREAST CORE NEEDLE BIOPSY COMPARISON:  Previous exam(s). PROCEDURE: I met with the patient and we discussed the procedure of ultrasound-guided biopsy, including benefits and alternatives. We discussed the high likelihood of a successful procedure. We discussed the risks of the procedure, including infection, bleeding, tissue injury, clip migration, and inadequate sampling. Informed written consent was given. The usual time-out protocol was performed immediately prior to the procedure. Lesion quadrant: Lower outer Using sterile technique and 1%  Lidocaine as local anesthetic, under direct ultrasound visualization, a 14 gauge spring-loaded device was used to perform biopsy of the mass/distortion in the right breast at 9 o'clock 3 cm from nipple using a lateral to medial approach. At the conclusion of the procedure a ribbon shaped tissue marker clip was deployed into the biopsy cavity. Follow up 2 view mammogram was performed and dictated separately. IMPRESSION: Ultrasound guided biopsy of the mass in the right breast at 9 o'clock 3 cm from nipple. No apparent complications. Electronically Signed: By: Everlean Alstrom M.D. On: 03/26/2020 13:12       IMPRESSION/PLAN:  The patient was seen in multidisciplinary breast clinic today.  She is going to proceed with a second biopsy of an additional area within the right breast, but at this time is felt to be a potential good candidate for breast conservation treatment for her presentation with a cT1 cN0 M0 invasive lobular carcinoma of the right breast.  We discussed the role of adjuvant radiation treatment in the setting of breast conservation treatment.  We discussed  the recommendation to potentially proceed with a lumpectomy followed by what would be an anticipated 4-week course of adjuvant radiation treatment.  We discussed the pros and cons of treatment as well as the rationale of such a treatment.  All of her questions were answered and she is eager to proceed with her overall treatment plan.  We will follow-up on her upcoming biopsy results and final surgical results and then look forward to seeing her as appropriate postoperatively.  The patient was seen in person today in clinic.  The total time spent on the patient's visit today was 45 min, including chart review, direct discussion/evaluation with the patient, and coordination of care.      ________________________________   Jodelle Gross, MD, PhD   **Disclaimer: This note was dictated with voice recognition software. Similar sounding  words can inadvertently be transcribed and this note may contain transcription errors which may not have been corrected upon publication of note.**

## 2020-04-08 ENCOUNTER — Other Ambulatory Visit: Payer: Self-pay | Admitting: Surgery

## 2020-04-08 DIAGNOSIS — C50911 Malignant neoplasm of unspecified site of right female breast: Secondary | ICD-10-CM

## 2020-04-10 ENCOUNTER — Other Ambulatory Visit: Payer: Self-pay | Admitting: Surgery

## 2020-04-10 NOTE — Pre-Procedure Instructions (Signed)
[id number] - [place], [place] - [address] [place] [address] Phone: [phone] Fax: [phone]     Your procedure is scheduled on Wednesday, July 21 from 11:15 AM- 12:16 PM.  Report to [facility] Entrance "A" at 09:15 A.M., and check in at the Admitting office.  Call this number if you have problems the morning of surgery:  [phone]  Call [phone] if you have any questions prior to your surgery date Monday-Friday 8am-4pm.    Remember:  Do not eat after midnight the night before your surgery.  You may drink clear liquids until 08:15 AM the morning of your surgery.   Clear liquids allowed are: Water, Non-Citrus Juices (without pulp), Carbonated Beverages, Clear Tea, Black Coffee Only, and Gatorade.  . The day of surgery (if you have diabetes): o  o Drink ONE (1) Gatorade 2 (G2) by 08:15 AM the morning of surgery o This drink was given to you during your hospital  pre-op appointment visit.  o Color of the Gatorade may vary. Red is not allowed. o Nothing else to drink after completing the  Gatorade 2 (G2).         If you have questions, please contact your surgeon's office.     Take these medicines the morning of surgery with A SIP OF WATER: acetaminophen (TYLENOL) anastrozole (ARIMIDEX)  carvedilol (COREG)  pravastatin (PRAVACHOL)  As of today, STOP taking any Aspirin (unless otherwise instructed by your surgeon) Aleve, Naproxen, Ibuprofen, Motrin, Advil, Goody's, BC's, all herbal medications, fish oil, and all vitamins.   WHAT DO I DO ABOUT MY DIABETES MEDICATION?  . THE NIGHT BEFORE SURGERY, take 7.5 units of LANTUS SOLOSTAR.      . THE MORNING OF SURGERY, If your CBG is greater than 220 mg/dL, you may take  of your sliding scale (correction) dose of insulin (take 3 units of HUMALOG insulin).   HOW TO MANAGE YOUR DIABETES BEFORE AND AFTER SURGERY  Why is it important to control my blood sugar before and after  surgery? . Improving blood sugar levels before and after surgery helps healing and can limit problems. . A way of improving blood sugar control is eating a healthy diet by: o  Eating less sugar and carbohydrates o  Increasing activity/exercise o  Talking with your doctor about reaching your blood sugar goals . High blood sugars (greater than 180 mg/dL) can raise your risk of infections and slow your recovery, so you will need to focus on controlling your diabetes during the weeks before surgery. . Make sure that the doctor who takes care of your diabetes knows about your planned surgery including the date and location.  How do I manage my blood sugar before surgery? . Check your blood sugar at least 4 times a day, starting 2 days before surgery, to make sure that the level is not too high or low. . Check your blood sugar the morning of your surgery when you wake up and every 2 hours until you get to the Short Stay unit. o If your blood sugar is less than 70 mg/dL, you will need to treat for low blood sugar: - Do not take insulin. - Treat a low blood sugar (less than 70 mg/dL) with  cup of clear juice (cranberry or apple), 4 glucose tablets, OR glucose gel. - Recheck blood sugar in 15 minutes after treatment (to make sure it is greater than 70 mg/dL). If your blood sugar is not greater than  70 mg/dL on recheck, call [phone] for further instructions. . Report your blood sugar to the short stay nurse when you get to Short Stay.  . If you are admitted to the hospital after surgery: o Your blood sugar will be checked by the staff and you will probably be given insulin after surgery (instead of oral diabetes medicines) to make sure you have good blood sugar levels. o The goal for blood sugar control after surgery is 80-180 mg/dL.          The Morning of Surgery:             Do not wear jewelry, make up, or nail polish.            Do not wear lotions, powders, perfumes, or deodorant.             Do not shave 48 hours prior to surgery.              Do not bring valuables to the hospital.            [facility] is not responsible for any belongings or valuables.  Do NOT Smoke (Tobacco/Vaping) or drink Alcohol 24 hours prior to your procedure.  If you use a CPAP at night, you may bring all equipment for your overnight stay.   Contacts, glasses, dentures or bridgework may not be worn into surgery.      For patients admitted to the hospital, discharge time will be determined by your treatment team.   Patients discharged the day of surgery will not be allowed to drive home, and someone needs to stay with them for 24 hours.    Special instructions:   [facility]- Preparing For Surgery  Before surgery, you can play an important role. Because skin is not sterile, your skin needs to be as free of germs as possible. You can reduce the number of germs on your skin by washing with Antibacterial Soap before surgery.     Oral Hygiene is also important to reduce your risk of infection.  Remember - BRUSH YOUR TEETH THE MORNING OF SURGERY WITH YOUR REGULAR TOOTHPASTE   Please follow these instructions carefully.   1. Shower the NIGHT BEFORE SURGERY and the MORNING OF SURGERY with  Antibacterial Soap.   2. Wash thoroughly, paying special attention to the area where your surgery will be performed.  3. Thoroughly rinse your body with warm water from the neck down.  4. Pat yourself dry with a CLEAN TOWEL.  5. Wear CLEAN PAJAMAS to bed the night before surgery  6. Place CLEAN SHEETS on your bed the night of your first shower and DO NOT SLEEP WITH PETS.   Day of Surgery: Wear Clean/Comfortable clothing the morning of surgery Do not apply any deodorants/lotions.   Remember to brush your teeth WITH YOUR REGULAR TOOTHPASTE.   Please read over the following fact sheets that you were given.

## 2020-04-10 NOTE — Progress Notes (Signed)
[employer] Dr. Ninfa Linden to specify laterality for procedure scheduled 04/17/20.

## 2020-04-11 ENCOUNTER — Encounter (HOSPITAL_COMMUNITY)
Admission: RE | Admit: 2020-04-11 | Discharge: 2020-04-11 | Disposition: A | Discharge status: Home / Self Care | Payer: Medicare Other | Source: Ambulatory Visit | Attending: Surgery | Admitting: Surgery

## 2020-04-11 ENCOUNTER — Other Ambulatory Visit: Payer: Self-pay

## 2020-04-11 ENCOUNTER — Encounter (HOSPITAL_COMMUNITY): Payer: Self-pay

## 2020-04-11 DIAGNOSIS — E785 Hyperlipidemia, unspecified: Secondary | ICD-10-CM | POA: Diagnosis not present

## 2020-04-11 DIAGNOSIS — I129 Hypertensive chronic kidney disease with stage 1 through stage 4 chronic kidney disease, or unspecified chronic kidney disease: Secondary | ICD-10-CM | POA: Diagnosis not present

## 2020-04-11 DIAGNOSIS — N184 Chronic kidney disease, stage 4 (severe): Secondary | ICD-10-CM | POA: Insufficient documentation

## 2020-04-11 DIAGNOSIS — E1122 Type 2 diabetes mellitus with diabetic chronic kidney disease: Secondary | ICD-10-CM | POA: Diagnosis not present

## 2020-04-11 DIAGNOSIS — Z01818 Encounter for other preprocedural examination: Secondary | ICD-10-CM | POA: Diagnosis not present

## 2020-04-11 LAB — BASIC METABOLIC PANEL
Anion gap: 11 (ref 5–15)
BUN: 27 mg/dL — ABNORMAL HIGH (ref 8–23)
CO2: 22 mmol/L (ref 22–32)
Calcium: 10 mg/dL (ref 8.9–10.3)
Chloride: 106 mmol/L (ref 98–111)
Creatinine, Ser: 1.74 mg/dL — ABNORMAL HIGH (ref 0.44–1.00)
GFR calc Af Amer: 34 mL/min — ABNORMAL LOW (ref >60)
GFR calc non Af Amer: 29 mL/min — ABNORMAL LOW (ref >60)
Glucose, Bld: 197 mg/dL — ABNORMAL HIGH (ref 70–99)
Potassium: 4.3 mmol/L (ref 3.5–5.1)
Sodium: 139 mmol/L (ref 135–145)

## 2020-04-11 LAB — CBC
HCT: 47.2 % — ABNORMAL HIGH (ref 36.0–46.0)
Hemoglobin: 15.8 g/dL — ABNORMAL HIGH (ref 12.0–15.0)
MCH: 30.8 pg (ref 26.0–34.0)
MCHC: 33.5 g/dL (ref 30.0–36.0)
MCV: 92 fL (ref 80.0–100.0)
Platelets: 163 10*3/uL (ref 150–400)
RBC: 5.13 MIL/uL — ABNORMAL HIGH (ref 3.87–5.11)
RDW: 12.5 % (ref 11.5–15.5)
WBC: 6.5 10*3/uL (ref 4.0–10.5)
nRBC: 0 % (ref 0.0–0.2)

## 2020-04-11 LAB — HEMOGLOBIN A1C
Hgb A1c MFr Bld: 7.6 % — ABNORMAL HIGH (ref 4.8–5.6)
Mean Plasma Glucose: 171.42 mg/dL

## 2020-04-11 LAB — GLUCOSE, CAPILLARY: Glucose-Capillary: 219 mg/dL — ABNORMAL HIGH (ref 70–99)

## 2020-04-11 NOTE — Progress Notes (Signed)
PCP - J COPELAND  [facility] [place] Cardiologist - NA  Chest x-ray - 6/21 EKG - 7/21 Stress Test -NA  ECHO - NA Cardiac Cath -NA    Fasting Blood Sugar - 219 Checks Blood Sugar __2___ times a day  : Aspirin Instructions:TO STOP  ERAS Protcol -YES PRE-SURGERY Ensure or G2-   DIABETIC DRINK GIVEN WITH INSTRUCTIONS  COVID TEST- FOR SAT   Anesthesia review: HTN  Patient denies shortness of breath, fever, cough and chest pain at PAT appointment   All instructions explained to the patient, with a verbal understanding of the material. Patient agrees to go over the instructions while at home for a better understanding. Patient also instructed to self quarantine after being tested for COVID-19. The opportunity to ask questions was provided.

## 2020-04-12 ENCOUNTER — Telehealth: Payer: Self-pay | Admitting: *Deleted

## 2020-04-12 ENCOUNTER — Encounter: Payer: Self-pay | Admitting: *Deleted

## 2020-04-12 NOTE — Telephone Encounter (Signed)
Pt return call. Denies questions or concerns regarding dx or treatment care plan. Encourage pt to call with needs. Received verbal understanding.

## 2020-04-12 NOTE — Telephone Encounter (Signed)
Left vm regarding [facility] from 7.7.21. Contact information provided for questions or needs.

## 2020-04-12 NOTE — Anesthesia Preprocedure Evaluation (Addendum)
Anesthesia Evaluation  Patient identified by MRN, date of birth, ID band Patient awake    Reviewed: Allergy & Precautions, NPO status , Patient's Chart, lab work & pertinent test results  Airway Mallampati: II  TM Distance: >3 FB Neck ROM: Full    Dental  (+) Teeth Intact, Dental Advisory Given   Pulmonary    breath sounds clear to auscultation       Cardiovascular hypertension,  Rhythm:Regular Rate:Normal     Neuro/Psych    GI/Hepatic   Endo/Other  diabetes  Renal/GU      Musculoskeletal   Abdominal   Peds  Hematology   Anesthesia Other Findings   Reproductive/Obstetrics                            Anesthesia Physical Anesthesia Plan  ASA: III  Anesthesia Plan: General   Post-op Pain Management:    Induction: Intravenous  PONV Risk Score and Plan: Ondansetron and Dexamethasone  Airway Management Planned: LMA  Additional Equipment:   Intra-op Plan:   Post-operative Plan:   Informed Consent: I have reviewed the patients History and Physical, chart, labs and discussed the procedure including the risks, benefits and alternatives for the proposed anesthesia with the patient or authorized representative who has indicated his/her understanding and acceptance.     Dental advisory given  Plan Discussed with: CRNA and Anesthesiologist  Anesthesia Plan Comments: (PAT note by Karoline Caldwell, PA-C: Patient with history of IDDM2, hyperlipidemia, stage IV chronic kidney disease, renal and bladder cancer, cerebral aneurysm status post repair, hypertension.   She had a left nephrectomy in 2018, brain aneurysm clipping in 1990.  CKD followed by nephrologist Dr. Hollie Salk.   Renal and bladder cancer followed by urologist Dr. Tresa Moore.  IDDMII followed by endocrinologist Dr. Thomasenia Sales. Last A1c 7.6 on 04/11/20.  Preop labs reviewed, creatinine 1.74 c/w CKD 4, glucose 197 c/w poorly controlled  IDDM2. Remainder of labs unremarkable.  EKG 04/11/20: NSR. Rate 74.  )       Anesthesia Quick Evaluation

## 2020-04-12 NOTE — Progress Notes (Signed)
Anesthesia Chart Review:  Patient with history of IDDM2, hyperlipidemia, stage IV chronic kidney disease, renal and bladder cancer, cerebral aneurysm status post repair, hypertension.   She had a left nephrectomy in 2018, brain aneurysm clipping in 1990.  CKD followed by nephrologist Dr. Hollie Salk.   Renal and bladder cancer followed by urologist Dr. Tresa Moore.  IDDMII followed by endocrinologist Dr. Thomasenia Sales. Last A1c 7.6 on 04/11/20.  Preop labs reviewed, creatinine 1.74 c/w CKD 4, glucose 197 c/w poorly controlled IDDM2. Remainder of labs unremarkable.  EKG 04/11/20: NSR. Rate 102.   Wynonia Musty [facility] Short Stay Center/Anesthesiology Phone [phone] 04/12/2020 11:33 AM

## 2020-04-13 ENCOUNTER — Other Ambulatory Visit (HOSPITAL_COMMUNITY)
Admission: RE | Admit: 2020-04-13 | Discharge: 2020-04-13 | Disposition: A | Discharge status: Home / Self Care | Payer: Medicare Other | Source: Ambulatory Visit | Attending: Surgery | Admitting: Surgery

## 2020-04-13 DIAGNOSIS — Z01812 Encounter for preprocedural laboratory examination: Secondary | ICD-10-CM | POA: Diagnosis present

## 2020-04-13 DIAGNOSIS — Z20822 Contact with and (suspected) exposure to covid-19: Secondary | ICD-10-CM | POA: Diagnosis not present

## 2020-04-13 LAB — SARS CORONAVIRUS 2 (TAT 6-24 HRS): SARS Coronavirus 2: NEGATIVE

## 2020-04-16 ENCOUNTER — Telehealth: Payer: Self-pay | Admitting: *Deleted

## 2020-04-16 NOTE — H&P (Signed)
  Lovena Neighbours Appointment: 04/03/2020 9:00 AM Location: [facility] Patient #: [id number] DOB: 1951-01-09 Undefined / Language: Cleophus Molt / Race: White Female   History of Present Illness Nathaneil Canary A. Ninfa Linden MD; 04/03/2020 9:29 AM) The patient is a 69 year old female who presents with breast cancer.    Chief complaint: Right breast cancer  This is a 69 year old female with a previous history of a renal cell cancer and bladder cancer status post nephrectomy who was recently found to have an abnormality in the right breast on screening mammography. She had calcifications and a distortion. The area measured 1.9 x 1.5 x 1.3 cm in size. The final biopsy showed both invasive and in situ lobular carcinoma. There was another area of architectural distortion which is being biopsy tomorrow in the right breast. Her malignancy was strongly ER/PR positive with a Ki-67 of 5%. Her most recent creatinine was 1.9 with a decreased GFR. She has no cardiopulmonary issues. She has no previous problems with her breast. She denies nipple discharge.   Medication History Conni Slipper, RN; 04/03/2020 8:08 AM) Medications Reconciled    Physical Exam ( A. Ninfa Linden MD; 04/03/2020 9:29 AM) The physical exam findings are as follows: Note: On physical examination, she appears well.  There are no palpable breast masses and no axillary adenopathy. The nipple is retracted on the right side. She is really uncertain of how long this has been present.    Assessment & Plan ( A. 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 We have also discussed her in detail in our multidisciplinary breast cancer conference. She has a right lobular and in situ breast cancer. I had a long discussion with the patient regarding breast cancer and the many types of cancer. We discussed breast  conservation which would include a lumpectomy and radiation versus a mastectomy. Currently she is a candidate for breast conservation and is interested in this. She is undergoing a biopsy of the distortion tomorrow and depending on the size this may change our ultimate operative plan. We discussed proceeding with a radioactive seed guided right breast lumpectomy and sentinel lymph node biopsy. I discussed the surgical procedure in detail. We discussed the risk which includes but is not limited to bleeding, infection, injury to surrounding structures, the need for further surgery if the margins or lymph nodes are positive, cardiopulmonary issues, postoperative recovery, etc. We will tentatively schedule surgery but again, our plans may change depending on the second biopsy. We discussed getting a preoperative MRI but this would require contrast which is currently contraindicated given her single kidney and elevated creatinine. She understands and wished to proceed with surgery.

## 2020-04-16 NOTE — Telephone Encounter (Signed)
[name] having two breast biopsies on Wednesday.  Has had preops done.  Has had covid vaccines x2 and a neg test.  Understands the procedure to do done and has no questions.  Will call for any needs. Alisea Matte,BSN,RN3,CCM,CN.

## 2020-04-17 ENCOUNTER — Encounter (HOSPITAL_COMMUNITY)
Admission: RE | Disposition: A | Discharge status: Home / Self Care | Payer: Self-pay | Source: Home / Self Care | Attending: Surgery

## 2020-04-17 ENCOUNTER — Ambulatory Visit
Admission: RE | Admit: 2020-04-17 | Discharge: 2020-04-17 | Disposition: A | Discharge status: Home / Self Care | Payer: Medicare Other | Source: Ambulatory Visit | Attending: Surgery | Admitting: Surgery

## 2020-04-17 ENCOUNTER — Ambulatory Visit (HOSPITAL_COMMUNITY): Payer: Medicare Other | Admitting: Physician Assistant

## 2020-04-17 ENCOUNTER — Other Ambulatory Visit: Payer: Self-pay

## 2020-04-17 ENCOUNTER — Ambulatory Visit (HOSPITAL_COMMUNITY)
Admission: RE | Admit: 2020-04-17 | Discharge: 2020-04-17 | Disposition: A | Discharge status: Home / Self Care | Payer: Medicare Other | Attending: Surgery | Admitting: Surgery

## 2020-04-17 ENCOUNTER — Ambulatory Visit (HOSPITAL_COMMUNITY): Payer: Medicare Other | Admitting: Certified Registered"

## 2020-04-17 ENCOUNTER — Ambulatory Visit (HOSPITAL_COMMUNITY)
Admission: RE | Admit: 2020-04-17 | Discharge: 2020-04-17 | Disposition: A | Discharge status: Home / Self Care | Payer: Medicare Other | Source: Ambulatory Visit | Attending: Surgery | Admitting: Surgery

## 2020-04-17 DIAGNOSIS — C50911 Malignant neoplasm of unspecified site of right female breast: Secondary | ICD-10-CM

## 2020-04-17 DIAGNOSIS — Z170 Estrogen receptor positive status [ER+]: Secondary | ICD-10-CM | POA: Diagnosis not present

## 2020-04-17 DIAGNOSIS — E119 Type 2 diabetes mellitus without complications: Secondary | ICD-10-CM | POA: Insufficient documentation

## 2020-04-17 DIAGNOSIS — I10 Essential (primary) hypertension: Secondary | ICD-10-CM | POA: Insufficient documentation

## 2020-04-17 DIAGNOSIS — Z905 Acquired absence of kidney: Secondary | ICD-10-CM | POA: Insufficient documentation

## 2020-04-17 DIAGNOSIS — Z8551 Personal history of malignant neoplasm of bladder: Secondary | ICD-10-CM | POA: Insufficient documentation

## 2020-04-17 DIAGNOSIS — Z85528 Personal history of other malignant neoplasm of kidney: Secondary | ICD-10-CM | POA: Insufficient documentation

## 2020-04-17 HISTORY — PX: BREAST LUMPECTOMY WITH RADIOACTIVE SEED AND SENTINEL LYMPH NODE BIOPSY: SHX6550

## 2020-04-17 LAB — GLUCOSE, CAPILLARY
Glucose-Capillary: 171 mg/dL — ABNORMAL HIGH (ref 70–99)
Glucose-Capillary: 190 mg/dL — ABNORMAL HIGH (ref 70–99)

## 2020-04-17 SURGERY — BREAST LUMPECTOMY WITH RADIOACTIVE SEED AND SENTINEL LYMPH NODE BIOPSY
Anesthesia: General | Site: Breast | Laterality: Right

## 2020-04-17 MED ORDER — CIPROFLOXACIN IN D5W 400 MG/200ML IV SOLN
INTRAVENOUS | Status: AC
  Filled 2020-04-17: qty 200

## 2020-04-17 MED ORDER — GABAPENTIN 300 MG PO CAPS
ORAL_CAPSULE | ORAL | Status: AC
  Administered 2020-04-17: 300 mg via ORAL
  Filled 2020-04-17: qty 1

## 2020-04-17 MED ORDER — ACETAMINOPHEN 500 MG PO TABS: 1000.0000 mg | ORAL_TABLET | ORAL | Status: AC

## 2020-04-17 MED ORDER — MIDAZOLAM HCL 2 MG/2ML IJ SOLN
INTRAMUSCULAR | Status: AC
  Administered 2020-04-17: 1 mg via INTRAVENOUS
  Filled 2020-04-17: qty 2

## 2020-04-17 MED ORDER — MIDAZOLAM HCL 2 MG/2ML IJ SOLN
INTRAMUSCULAR | Status: AC
  Filled 2020-04-17: qty 2

## 2020-04-17 MED ORDER — FENTANYL CITRATE (PF) 100 MCG/2ML IJ SOLN: 50.0000 ug | Freq: Once | INTRAMUSCULAR | Status: AC

## 2020-04-17 MED ORDER — 0.9 % SODIUM CHLORIDE (POUR BTL) OPTIME
TOPICAL | Status: DC | PRN
  Administered 2020-04-17: 1000 mL

## 2020-04-17 MED ORDER — PROPOFOL 10 MG/ML IV BOLUS
INTRAVENOUS | Status: AC
  Filled 2020-04-17: qty 20

## 2020-04-17 MED ORDER — ENSURE PRE-SURGERY PO LIQD: 296.0000 mL | Freq: Once | ORAL | Status: DC

## 2020-04-17 MED ORDER — PHENYLEPHRINE HCL-NACL 10-0.9 MG/250ML-% IV SOLN
INTRAVENOUS | Status: DC | PRN
  Administered 2020-04-17: 25 ug/min via INTRAVENOUS

## 2020-04-17 MED ORDER — FENTANYL CITRATE (PF) 100 MCG/2ML IJ SOLN
INTRAMUSCULAR | Status: AC
  Administered 2020-04-17: 50 ug via INTRAVENOUS
  Filled 2020-04-17: qty 2

## 2020-04-17 MED ORDER — FENTANYL CITRATE (PF) 250 MCG/5ML IJ SOLN
INTRAMUSCULAR | Status: AC
  Filled 2020-04-17: qty 5

## 2020-04-17 MED ORDER — MIDAZOLAM HCL 2 MG/2ML IJ SOLN: 1.0000 mg | Freq: Once | INTRAMUSCULAR | Status: AC

## 2020-04-17 MED ORDER — LIDOCAINE 2% (20 MG/ML) 5 ML SYRINGE
INTRAMUSCULAR | Status: DC | PRN
  Administered 2020-04-17: 100 mg via INTRAVENOUS

## 2020-04-17 MED ORDER — DEXAMETHASONE SODIUM PHOSPHATE 10 MG/ML IJ SOLN
INTRAMUSCULAR | Status: AC
  Filled 2020-04-17: qty 1

## 2020-04-17 MED ORDER — GABAPENTIN 300 MG PO CAPS: 300.0000 mg | ORAL_CAPSULE | ORAL | Status: DC

## 2020-04-17 MED ORDER — TRAMADOL HCL 50 MG PO TABS: 50.0000 mg | ORAL_TABLET | Freq: Four times a day (QID) | ORAL | 0 refills | Status: DC | PRN

## 2020-04-17 MED ORDER — ORAL CARE MOUTH RINSE
15.0000 mL | Freq: Once | OROMUCOSAL | Status: AC
  Administered 2020-04-17: 15 mL via OROMUCOSAL

## 2020-04-17 MED ORDER — DEXAMETHASONE SODIUM PHOSPHATE 4 MG/ML IJ SOLN
INTRAMUSCULAR | Status: DC | PRN
  Administered 2020-04-17: 4 mg via INTRAVENOUS

## 2020-04-17 MED ORDER — ONDANSETRON HCL 4 MG/2ML IJ SOLN
INTRAMUSCULAR | Status: DC | PRN
  Administered 2020-04-17: 4 mg via INTRAVENOUS

## 2020-04-17 MED ORDER — GABAPENTIN 300 MG PO CAPS: 300.0000 mg | ORAL_CAPSULE | ORAL | Status: AC

## 2020-04-17 MED ORDER — ACETAMINOPHEN 500 MG PO TABS
ORAL_TABLET | ORAL | Status: AC
  Administered 2020-04-17: 1000 mg via ORAL
  Filled 2020-04-17: qty 2

## 2020-04-17 MED ORDER — PROPOFOL 10 MG/ML IV BOLUS
INTRAVENOUS | Status: DC | PRN
  Administered 2020-04-17: 150 mg via INTRAVENOUS

## 2020-04-17 MED ORDER — TECHNETIUM TC 99M SULFUR COLLOID FILTERED
1.0000 | Freq: Once | INTRAVENOUS | Status: AC | PRN
  Administered 2020-04-17: 1 via INTRADERMAL

## 2020-04-17 MED ORDER — SODIUM CHLORIDE (PF) 0.9 % IJ SOLN
INTRAMUSCULAR | Status: AC
  Filled 2020-04-17: qty 10

## 2020-04-17 MED ORDER — TRAMADOL HCL 50 MG PO TABS
50.0000 mg | ORAL_TABLET | Freq: Once | ORAL | Status: AC
  Administered 2020-04-17: 50 mg via ORAL

## 2020-04-17 MED ORDER — BUPIVACAINE HCL (PF) 0.25 % IJ SOLN
INTRAMUSCULAR | Status: DC | PRN
  Administered 2020-04-17: 12 mL

## 2020-04-17 MED ORDER — ONDANSETRON HCL 4 MG/2ML IJ SOLN
INTRAMUSCULAR | Status: AC
  Filled 2020-04-17: qty 2

## 2020-04-17 MED ORDER — METHYLENE BLUE 0.5 % INJ SOLN
INTRAVENOUS | Status: AC
  Filled 2020-04-17: qty 10

## 2020-04-17 MED ORDER — LACTATED RINGERS IV SOLN: INTRAVENOUS | Status: DC

## 2020-04-17 MED ORDER — TRAMADOL HCL 50 MG PO TABS
ORAL_TABLET | ORAL | Status: AC
  Filled 2020-04-17: qty 1

## 2020-04-17 MED ORDER — CIPROFLOXACIN IN D5W 400 MG/200ML IV SOLN
400.0000 mg | INTRAVENOUS | Status: AC
  Administered 2020-04-17: 400 mg via INTRAVENOUS

## 2020-04-17 MED ORDER — FENTANYL CITRATE (PF) 100 MCG/2ML IJ SOLN
INTRAMUSCULAR | Status: DC | PRN
  Administered 2020-04-17 (×2): 25 ug via INTRAVENOUS
  Administered 2020-04-17: 50 ug via INTRAVENOUS

## 2020-04-17 MED ORDER — SODIUM CHLORIDE (PF) 0.9 % IJ SOLN
INTRAVENOUS | Status: DC | PRN
  Administered 2020-04-17: 5 mL via INTRAMUSCULAR

## 2020-04-17 SURGICAL SUPPLY — 42 items
ADH SKN CLS APL DERMABOND .7 (GAUZE/BANDAGES/DRESSINGS) ×1
APL PRP STRL LF DISP 70% ISPRP (MISCELLANEOUS) ×1
APPLIER CLIP 9.375 MED OPEN (MISCELLANEOUS) ×3
APR CLP MED 9.3 20 MLT OPN (MISCELLANEOUS) ×1
BINDER BREAST LRG (GAUZE/BANDAGES/DRESSINGS) IMPLANT
BINDER BREAST XLRG (GAUZE/BANDAGES/DRESSINGS) IMPLANT
BINDER BREAST XXLRG (GAUZE/BANDAGES/DRESSINGS) ×2 IMPLANT
CANISTER SUCT 3000ML PPV (MISCELLANEOUS) ×3 IMPLANT
CHLORAPREP W/TINT 26 (MISCELLANEOUS) ×3 IMPLANT
CLIP APPLIE 9.375 MED OPEN (MISCELLANEOUS) ×1 IMPLANT
COVER PROBE W GEL 5X96 (DRAPES) ×3 IMPLANT
COVER SURGICAL LIGHT HANDLE (MISCELLANEOUS) ×3 IMPLANT
COVER WAND RF STERILE (DRAPES) ×1 IMPLANT
DERMABOND ADVANCED (GAUZE/BANDAGES/DRESSINGS) ×2
DERMABOND ADVANCED .7 DNX12 (GAUZE/BANDAGES/DRESSINGS) ×1 IMPLANT
DEVICE DUBIN SPECIMEN MAMMOGRA (MISCELLANEOUS) ×3 IMPLANT
DRAPE CHEST BREAST 15X10 FENES (DRAPES) ×3 IMPLANT
DRSG PAD ABDOMINAL 8X10 ST (GAUZE/BANDAGES/DRESSINGS) ×2 IMPLANT
ELECT CAUTERY BLADE 6.4 (BLADE) ×3 IMPLANT
ELECT REM PT RETURN 9FT ADLT (ELECTROSURGICAL) ×3
ELECTRODE REM PT RTRN 9FT ADLT (ELECTROSURGICAL) ×1 IMPLANT
GLOVE SURG SIGNA 7.5 PF LTX (GLOVE) ×3 IMPLANT
GOWN STRL REUS W/ TWL LRG LVL3 (GOWN DISPOSABLE) ×1 IMPLANT
GOWN STRL REUS W/ TWL XL LVL3 (GOWN DISPOSABLE) ×1 IMPLANT
GOWN STRL REUS W/TWL LRG LVL3 (GOWN DISPOSABLE) ×3
GOWN STRL REUS W/TWL XL LVL3 (GOWN DISPOSABLE) ×3
KIT BASIN OR (CUSTOM PROCEDURE TRAY) ×3 IMPLANT
KIT MARKER MARGIN INK (KITS) ×3 IMPLANT
NDL 18GX1X1/2 (RX/OR ONLY) (NEEDLE) IMPLANT
NDL FILTER BLUNT 18X1 1/2 (NEEDLE) IMPLANT
NDL HYPO 25GX1X1/2 BEV (NEEDLE) ×1 IMPLANT
NEEDLE 18GX1X1/2 (RX/OR ONLY) (NEEDLE) IMPLANT
NEEDLE FILTER BLUNT 18X 1/2SAF (NEEDLE)
NEEDLE FILTER BLUNT 18X1 1/2 (NEEDLE) IMPLANT
NEEDLE HYPO 25GX1X1/2 BEV (NEEDLE) ×3 IMPLANT
NS IRRIG 1000ML POUR BTL (IV SOLUTION) ×3 IMPLANT
PACK GENERAL/GYN (CUSTOM PROCEDURE TRAY) ×3 IMPLANT
SUT MNCRL AB 4-0 PS2 18 (SUTURE) ×3 IMPLANT
SUT VIC AB 3-0 SH 18 (SUTURE) ×3 IMPLANT
SYR CONTROL 10ML LL (SYRINGE) ×3 IMPLANT
TOWEL GREEN STERILE (TOWEL DISPOSABLE) ×3 IMPLANT
TOWEL GREEN STERILE FF (TOWEL DISPOSABLE) ×3 IMPLANT

## 2020-04-17 NOTE — Anesthesia Procedure Notes (Signed)
Anesthesia Regional Block: Pectoralis block   Pre-Anesthetic Checklist: ,, timeout performed, Correct Patient, Correct Site, Correct Laterality, Correct Procedure, Correct Position, site marked, Risks and benefits discussed,  Surgical consent,  Pre-op evaluation,  At surgeon's request and post-op pain management  Laterality: Right  Prep: chloraprep       Needles:  Injection technique: Single-shot  Needle Type: Echogenic Needle     Needle Length: 9cm  Needle Gauge: 21     Additional Needles:   Narrative:  Start time: 04/17/2020 9:35 AM End time: 04/17/2020 9:45 AM Injection made incrementally with aspirations every 5 mL.  Performed by: Personally  Anesthesiologist: Roberts Gaudy, MD  Additional Notes: 25 cc 0.25% Bupivacaine 1:200 epi 10 cc 1.3% Exparel

## 2020-04-17 NOTE — Anesthesia Postprocedure Evaluation (Signed)
Anesthesia Post Note  Patient: Tasha Tucker  Procedure(s) Performed: BREAST LUMPECTOMY X 2  WITH RADIOACTIVE SEED AND SENTINEL LYMPH NODE BIOPSY (Right Breast)     Patient location during evaluation: PACU Anesthesia Type: General Level of consciousness: awake and alert Pain management: pain level controlled Vital Signs Assessment: post-procedure vital signs reviewed and stable Respiratory status: spontaneous breathing, nonlabored ventilation, respiratory function stable and patient connected to nasal cannula oxygen Cardiovascular status: blood pressure returned to baseline and stable Postop Assessment: no apparent nausea or vomiting Anesthetic complications: no   No complications documented.  Last Vitals:  Vitals:   04/17/20 1245 04/17/20 1300  BP: (!) 157/70 (!) 164/57  Pulse: (!) 58 (!) 57  Resp: 15 10  Temp:  (!) 36.1 C  SpO2: 100% 100%    Last Pain:  Vitals:   04/17/20 1215  TempSrc:   PainSc: Asleep                 Jerrin Recore COKER

## 2020-04-17 NOTE — Discharge Instructions (Signed)
[place] Office Phone Number [phone]  BREAST BIOPSY/ PARTIAL MASTECTOMY: POST OP INSTRUCTIONS  Always review your discharge instruction sheet given to you by the facility where your surgery was performed.  IF YOU HAVE DISABILITY OR FAMILY LEAVE FORMS, YOU MUST BRING THEM TO THE OFFICE FOR PROCESSING.  DO NOT GIVE THEM TO YOUR DOCTOR.  1. A prescription for pain medication may be given to you upon discharge.  Take your pain medication as prescribed, if needed.  If narcotic pain medicine is not needed, then you may take acetaminophen (Tylenol) or ibuprofen (Advil) as needed. 2. Take your usually prescribed medications unless otherwise directed 3. If you need a refill on your pain medication, please contact your pharmacy.  They will contact our office to request authorization.  Prescriptions will not be filled after 5pm or on week-ends. 4. You should eat very light the first 24 hours after surgery, such as soup, crackers, pudding, etc.  Resume your normal diet the day after surgery. 5. Most patients will experience some swelling and bruising in the breast.  Ice packs and a good support bra will help.  Swelling and bruising can take several days to resolve.  6. It is common to experience some constipation if taking pain medication after surgery.  Increasing fluid intake and taking a stool softener will usually help or prevent this problem from occurring.  A mild laxative (Milk of Magnesia or Miralax) should be taken according to package directions if there are no bowel movements after 48 hours. 7. Unless discharge instructions indicate otherwise, you may remove your bandages 24-48 hours after surgery, and you may shower at that time.  You may have steri-strips (small skin tapes) in place directly over the incision.  These strips should be left on the skin for 7-10 days.  If your surgeon used skin glue on the incision, you may shower in 24 hours.  The glue will flake off over the  next 2-3 weeks.  Any sutures or staples will be removed at the office during your follow-up visit. 8. ACTIVITIES:  You may resume regular daily activities (gradually increasing) beginning the next day.  Wearing a good support bra or sports bra minimizes pain and swelling.  You may have sexual intercourse when it is comfortable. a. You may drive when you no longer are taking prescription pain medication, you can comfortably wear a seatbelt, and you can safely maneuver your car and apply brakes. b. RETURN TO WORK:  ______________________________________________________________________________________ 9. You should see your doctor in the office for a follow-up appointment approximately two weeks after your surgery.  Your doctor's nurse will typically make your follow-up appointment when she calls you with your pathology report.  Expect your pathology report 2-3 business days after your surgery.  You may call to check if you do not hear from [place] after three days. 10. OTHER INSTRUCTIONS:YOU MAY REMOVE THE BINDER IN THE MORNING AND SHOWER 11. ICE PACK AND TYLENOL ALSO FOR PAIN 12. NO VIGOROUS ACTIVITY FOR ONE WEEK _______________________________________________________________________________________________ _____________________________________________________________________________________________________________________________________ _____________________________________________________________________________________________________________________________________ _____________________________________________________________________________________________________________________________________  WHEN TO CALL YOUR DOCTOR: 1. Fever over 101.0 2. Nausea and/or vomiting. 3. Extreme swelling or bruising. 4. Continued bleeding from incision. 5. Increased pain, redness, or drainage from the incision.  The clinic staff is available to answer your questions during regular business hours.  Please don't  hesitate to call and ask to speak to one of the nurses for clinical concerns.  If you have a medical emergency, go to the nearest emergency room or  call 911.  A surgeon from [facility] is always on call at the hospital.  For further questions, please visit [email or web address]

## 2020-04-17 NOTE — Op Note (Signed)
   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 These were actually closed together so decision made to proceed with a lumpectomy with radioactive seed guidance with 2 seeds placed. Sentinel node biopsy is planned also  Findings: Both radioactive seeds was located in the single large lumpectomy specimen  Procedure: The patient was brought to operating room identifies correct patient. She is placed upon the operating table general anesthesia was induced. Her right breast and axilla were prepped and draped in usual sterile fashion. I had injected blue dye underneath the areola and massaged the breast before prepping. Identified both seeds in the outer quadrant of the right breast with the neoprobe. I anesthetized skin around the areola with Marcaine and made a circumareolar incision with a scalpel. I then dissected down to the breast tissue with the cautery. I performed a wide lumpectomy staying around both seeds and going down to the chest wall with the cautery. Once he was more medial toward the areola and one was more lateral. Once the entire lumpectomy specimen was removed, I marked with paint at the margins, and then x-rayed the specimen. This confirmed that both radioactive seeds were in the specimen. Hemostasis was then achieved with cautery and the lumpectomy cavity. I placed surgical clips at the  periphery of the cavity as well. I then anesthetize skin the axilla made incision with a scalpel. I dissected down to the deep axillary tissue. With the neoprobe I was able to identify 1 lymph node. Once this node was removed I found no other lymph nodes which were either palpable, had blue dye, or uptake of radioactive isotope. Hemostasis was achieved in both incisions with cautery. I then closed both incisions with interrupted 3-0 Vicryl sutures and running 4-0 Monocryl sutures. Dermabond her breast binder applied. The patient tolerated the procedure well. All the counts were correct at the end of the procedure. The patient was then extubated in the operating room and taken in stable addition to the recovery room.          Coralie Keens   Date: 04/17/2020  Time: 12:09 PM

## 2020-04-17 NOTE — Interval H&P Note (Signed)
History and Physical Interval Note: no change in H and P  04/17/2020 9:33 AM  Tasha Tucker  has presented today for surgery, with the diagnosis of RIGHT BREAST CANCER.  The various methods of treatment have been discussed with the patient and family. After consideration of risks, benefits and other options for treatment, the patient has consented to  Procedure(s): BREAST LUMPECTOMY X 2  WITH RADIOACTIVE SEED AND SENTINEL LYMPH NODE BIOPSY (Right) as a surgical intervention.  The patient's history has been reviewed, patient examined, no change in status, stable for surgery.  I have reviewed the patient's chart and labs.  Questions were answered to the patient's satisfaction.     Coralie Keens

## 2020-04-17 NOTE — Anesthesia Procedure Notes (Signed)
Procedure Name: LMA Insertion Date/Time: 04/17/2020 11:09 AM Performed by: Amadeo Garnet, CRNA Pre-anesthesia Checklist: Patient identified, Emergency Drugs available, Suction available and Patient being monitored Patient Re-evaluated:Patient Re-evaluated prior to induction Oxygen Delivery Method: Circle system utilized Preoxygenation: Pre-oxygenation with 100% oxygen Induction Type: IV induction Ventilation: Mask ventilation without difficulty LMA: LMA inserted LMA Size: 5.0 Number of attempts: 1 Placement Confirmation: positive ETCO2 Tube secured with: Tape Dental Injury: Teeth and Oropharynx as per pre-operative assessment

## 2020-04-17 NOTE — Progress Notes (Signed)
Nutrition  Patient identified by attending Breast Clinic on 04/03/2020.  Patient was given nutrition packet and contact information at this meeting.   Chart reviewed.   69 year old female with new diagnosis of breast cancer.  Having lumpectomy today.  Planning oncotype, followed by radiation and antiestrogens.    Ht: 68 inches Wt: 229 lb BMI: 34  Patient currently not at nutritional risk.  RD available if patient status changes.    Tasha Tucker B. Zenia Resides, [place], [place] Registered Dietitian [phone] (mobile)

## 2020-04-17 NOTE — Transfer of Care (Signed)
Immediate Anesthesia Transfer of Care Note  Patient: Tasha Tucker  Procedure(s) Performed: BREAST LUMPECTOMY X 2  WITH RADIOACTIVE SEED AND SENTINEL LYMPH NODE BIOPSY (Right Breast)  Patient Location: PACU  Anesthesia Type:GA combined with regional for post-op pain  Level of Consciousness: awake, alert  and oriented  Airway & Oxygen Therapy: Patient Spontanous Breathing  Post-op Assessment: Report given to RN, Post -op Vital signs reviewed and stable and Patient moving all extremities  Post vital signs: Reviewed and stable  Last Vitals:  Vitals Value Taken Time  BP 161/61 04/17/20 1215  Temp    Pulse 64 04/17/20 1217  Resp 9 04/17/20 1217  SpO2 97 % 04/17/20 1217  Vitals shown include unvalidated device data.  Last Pain:  Vitals:   04/17/20 0921  TempSrc: Oral         Complications: No complications documented.

## 2020-04-18 ENCOUNTER — Encounter (HOSPITAL_COMMUNITY): Payer: Self-pay | Admitting: Surgery

## 2020-04-23 ENCOUNTER — Encounter: Payer: Self-pay | Admitting: *Deleted

## 2020-04-23 ENCOUNTER — Telehealth: Payer: Self-pay | Admitting: *Deleted

## 2020-04-23 LAB — SURGICAL PATHOLOGY

## 2020-04-23 NOTE — Patient Instructions (Addendum)
Good to see you again today!   Please ask Dr Hollie Salk is she can check your urine microalbumin when you see her next I hope that all goes well with the rest of your breast cancer treatment Please see me in about 6 months and enjoy your trip to [place]!  I can't wait to hear all about it

## 2020-04-23 NOTE — Telephone Encounter (Signed)
Received order for oncotype testing per Dr. Doris Cheadle. Requisition sent to pathology and [facility]

## 2020-04-23 NOTE — Progress Notes (Signed)
[facility] [address] [place], [place] [address] [phone]  Date:  05/01/2020   Name:  Tasha Tucker   DOB:  10-29-1950   MRN:  [id number]  PCP:  Darreld Mclean, MD    Chief Complaint: Diabetes (6 month follow up)   History of Present Illness:  Tasha Tucker is a 69 y.o. very pleasant female patient who presents with the following:  Here today for a 6 month follow-up visit Last seen by myself in February of this year; since that time she was dx with breast cancer Her oncologist is Dr Jana Hakim She had a right lumpectomy and axillary node dissection about 2 weeks ago, going to defer radiation for now due to upcoming trip.  Taking arimidex   Also with history of diabetes, hyperlipidemia, stage IV chronic kidney disease, renal and bladder cancer, cerebral aneurysm status post repair, hypertension She had a left nephrectomy in 2018, brain aneurysm clipping in 1990 Nephrologist- [place],last seen in March- next visit in September  [place], seen in July   covid series- done  Urine micro due 2nd dose of shingrix- done  Labs:  BMP done in July Colon: cologuard due next year   She plans to go to [facility] in September -she is really looking forward to this trip  Patient Active Problem List   Diagnosis Date Noted  . Malignant neoplasm of upper-outer quadrant of right breast in female, estrogen receptor positive ([place]) 03/28/2020  . Controlled type 2 diabetes mellitus with complication, with long-term current use of insulin ([place]) 03/19/2018  . Hyperlipidemia 01/12/2018  . Cerebral aneurysm 12/29/2017  . Secondary hypertension, unspecified 12/29/2017  . CKD (chronic kidney disease) stage 4, GFR 15-29 ml/min (HCC) 12/29/2017  . Cancer of renal pelvis, left ([place]) 10/12/2016  . Renal mass 10/09/2016    Past Medical History:  Diagnosis Date  . Bladder cancer ([place])     [place]  . Breast cancer ([place])   . DJD (degenerative joint disease)    NECK, NEEDS PILLOW WHEN LYING FLAT IS POSSIBLE  . History of cerebral aneurysm repair    1996-- S/P CLAPPING RIGHT CAROTID --- NO DEFICITS  . 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 Hyperlipidemia   . Hypertension   . Liver hemangioma   . OA (osteoarthritis)   . PAC (premature atrial contraction)   . PONV (postoperative nausea and vomiting)   . Right trigger finger   . Type 2 diabetes mellitus ([place])   . Wears glasses     Past Surgical History:  Procedure Laterality Date  . ABDOMINAL HYSTERECTOMY  1990   PARTIAL  . BREAST LUMPECTOMY WITH RADIOACTIVE SEED AND SENTINEL LYMPH NODE BIOPSY Right 04/17/2020   Procedure: BREAST LUMPECTOMY X 2  WITH RADIOACTIVE SEED AND SENTINEL LYMPH NODE BIOPSY;  Surgeon: Coralie Keens, MD;  Location: [facility];  Service: General;  Laterality: Right;  . CATARACT EXTRACTION W/ INTRAOCULAR LENS  IMPLANT, BILATERAL  2016; 2017  . CEREBRAL ANEURYSM REPAIR  1996   clipped-no deficits RIGHT CAROTID  . CHOLECYSTECTOMY  2010  . CYSTOSCOPY W/ RETROGRADES Right 06/09/2017   Procedure: CYSTOSCOPY WITH RETROGRADE PYELOGRAM;  Surgeon: Alexis Frock, MD;  Location[facility];  Service: Urology;  Laterality: Right;  . CYSTOSCOPY W/ RETROGRADES Right 07/21/2017   Procedure: CYSTOSCOPY WITH RETROGRADE PYELOGRAM;  Surgeon: Alexis Frock, MD;  Location: [facility];  Service: Urology;  Laterality: Right;  . CYSTOSCOPY WITH RETROGRADE PYELOGRAM, URETEROSCOPY AND STENT PLACEMENT Left 08/26/2016   Procedure: CYSTOSCOPY WITH BILATERAL RETROGRADE PYELOGRAM,LEFT URETEROSCOPY WITH BIOPSY AND LEFT STENT PLACEMENT;  Surgeon: Alexis Frock, MD;  Location: [facility];  Service: Urology;  Laterality: Left;  . ROBOT ASSITED LAPAROSCOPIC NEPHROURETERECTOMY Left  10/09/2016   Procedure: XI ROBOT ASSITED LAPAROSCOPIC NEPHROURETERECTOMY;  Surgeon: Alexis Frock, MD;  [facility];  Service: Urology;  Laterality: Left;  . SURGERY ON FOOT FOR STAPH INFECTION  1989  . TRANSURETHRAL RESECTION OF BLADDER TUMOR N/A 07/21/2017   Procedure: TRANSURETHRAL RESECTION OF BLADDER TUMOR (TURBT);  Surgeon: Alexis Frock, MD;  Location: [facility];  Service: Urology;  Laterality: N/A;  . TRANSURETHRAL RESECTION OF BLADDER TUMOR WITH MITOMYCIN-C N/A 06/09/2017   Procedure: TRANSURETHRAL RESECTION OF BLADDER TUMOR WITH MITOMYCIN-C;  Surgeon: Alexis Frock, MD;  Location: [facility];  Service: Urology;  Laterality: N/A;  . TUBAL LIGATION  1980    Social History   Tobacco Use  . Smoking status: Never Smoker  . Smokeless tobacco: Never Used  Vaping Use  . Vaping Use: Never used  Substance Use Topics  . Alcohol use: Yes    Comment: , STOPPED  . Drug use: No    Family History  Problem Relation Age of Onset  . Cancer Mother   . Hyperlipidemia Father   . Heart attack Father   . Diabetes Brother   . Breast cancer Neg Hx     Allergies  Allergen Reactions  . Adhesive [Tape] Rash    PAPER TAPE ONLY  . Chlorhexidine Gluconate Itching and Rash    Topical antiseptic (CHG WIPES)   . Dilantin [Phenytoin] Rash  . Penicillins Rash    Has patient had a PCN reaction causing immediate rash, facial/tongue/throat swelling, SOB or lightheadedness with hypotension:unsure Has patient had a PCN reaction causing severe rash involving mucus membranes or skin necrosis:unsure Has patient had a PCN reaction that required hospitalization:No Has patient had a PCN reaction occurring within the last 10 years:No If all of the above answers are "NO", then may proceed with Cephalosporin use.   . Sulfa Antibiotics Rash    Medication list has been reviewed and updated.  Current Outpatient Medications on File Prior to Visit  Medication Sig  Dispense Refill  . acetaminophen (TYLENOL) 500 MG tablet Take 500 mg by mouth daily.     Marland Kitchen anastrozole (ARIMIDEX) 1 MG tablet Take 1 tablet (1 mg total) by mouth daily. 90 tablet 4  . BD PEN NEEDLE NANO U/F 32G X 4 MM MISC USE FOR INSULIN ADMINISTRATION DAILY 100 each 3  . cetirizine (ZYRTEC) 10 MG tablet Take 10 mg by mouth every morning.     Marland Kitchen HUMALOG KWIKPEN 100 UNIT/ML KwikPen Inject 5-6 Units into the skin 3 (three) times daily.     Marland Kitchen LANTUS SOLOSTAR 100 UNIT/ML Solostar Pen Inject 24- 26 units subcue daily for diabetes (Patient taking differently: Inject 15 Units into the skin every evening. ) 15 mL 6  . ONETOUCH DELICA LANCETS 52D MISC Use lancet to stick finger and check blood sugar two times daily. 100 each 6  . ONETOUCH VERIO test strip USE AS DIRECTED. 300 each 2  . pravastatin (PRAVACHOL) 40 MG tablet Take 1 tablet (40 mg total) by mouth daily. 90 tablet 3  . traMADol (ULTRAM) 50 MG tablet Take 1 tablet (50 mg  total) by mouth every 6 (six) hours as needed for moderate pain or severe pain. 20 tablet 0   No current facility-administered medications on file prior to visit.    Review of Systems:  As per HPI- otherwise negative.   Physical Examination: Vitals:   05/01/20 0948  BP: 130/60  Pulse: 60  Resp: 18  Temp: 98.3 F (36.8 C)  SpO2: 97%   Vitals:   05/01/20 0948  Weight: 229 lb 4 oz (104 kg)   Body mass index is 34.86 kg/m. Ideal Body Weight:    GEN: no acute distress.  Overweight, looks well HEENT: Atraumatic, Normocephalic.  Ears and Nose: No external deformity. CV: RRR, No M/G/R. No JVD. No thrill. No extra heart sounds. PULM: CTA B, no wheezes, crackles, rhonchi. No retractions. No resp. distress. No accessory muscle use. ABD: S, NT, ND. No rebound. No HSM. EXTR: No c/c/e PSYCH: Normally interactive. Conversant.  Examined the operative incisions and right axilla and right breast.  Both appear to be healing appropriately without any sign of  infection  Wt Readings from Last 3 Encounters:  05/01/20 229 lb (103.9 kg)  05/01/20 229 lb 4 oz (104 kg)  04/17/20 229 lb (103.9 kg)    Assessment and Plan: Controlled type 2 diabetes mellitus with complication, with long-term current use of insulin (HCC)  Mixed hyperlipidemia  Secondary hypertension, unspecified - Plan: carvedilol (COREG) 6.25 MG tablet  Malignant neoplasm of upper-outer quadrant of right breast in female, estrogen receptor positive ([place])  Stage 3 chronic kidney disease, unspecified whether stage 3a or 3b CKD  Here today for a routine follow-up visit. Tasha Tucker is being treated for diabetes by endocrinology, most recent A1c showed reasonable control with both Humalog and Lantus Recent diagnosis of breast cancer, we discussed her treatment plans.  She feels good about her outlook Blood pressure under good control today with carvedilol Already check lipids today, the patient would like to defer She will continue to follow-up with her various specialists including nephrology as indicated  Asked her to see me in 6 months for follow-up This visit occurred during the SARS-CoV-2 public health emergency.  Safety protocols were in place, including screening questions prior to the visit, additional usage of staff PPE, and extensive cleaning of exam room while observing appropriate contact time as indicated for disinfecting solutions.      Signed Lamar Blinks, MD

## 2020-04-26 ENCOUNTER — Telehealth: Payer: Self-pay | Admitting: Adult Health

## 2020-04-26 NOTE — Telephone Encounter (Signed)
Called and gave patient info about AET monitoring study.  Mailed packet to her home with additional details.    Wilber Bihari, NP

## 2020-04-29 ENCOUNTER — Other Ambulatory Visit: Payer: Self-pay | Admitting: Family Medicine

## 2020-04-30 NOTE — Progress Notes (Signed)
Subjective:   Tasha Tucker is a 69 y.o. female who presents for an Initial Medicare Annual Wellness Visit.  Review of Systems    Cardiac Risk Factors include: advanced age (>27men, >30 women);diabetes mellitus;dyslipidemia;hypertension     Objective:    Today's Vitals   05/01/20 1033  BP: 130/60  Pulse: 60  Temp: 98.3 F (36.8 C)  TempSrc: Oral  SpO2: 97%  Weight: 229 lb (103.9 kg)  Height: 5\' 8"  (1.727 m)   Body mass index is 34.82 kg/m.  Advanced Directives 05/01/2020 04/11/2020 04/03/2020 10/12/2018 07/21/2017 06/09/2017 10/09/2016  Does Patient Have a Medical Advance Directive? No No No No No No No  Would patient like information on creating a medical advance directive? No - Patient declined - No - Patient declined No - Patient declined No - Patient declined No - Patient declined No - Patient declined    Current Medications (verified) Outpatient Encounter Medications as of 05/01/2020  Medication Sig  . acetaminophen (TYLENOL) 500 MG tablet Take 500 mg by mouth daily.   Marland Kitchen anastrozole (ARIMIDEX) 1 MG tablet Take 1 tablet (1 mg total) by mouth daily.  . BD PEN NEEDLE NANO U/F 32G X 4 MM MISC USE FOR INSULIN ADMINISTRATION DAILY  . carvedilol (COREG) 6.25 MG tablet Take 1 tablet (6.25 mg total) by mouth 2 (two) times daily.  . cetirizine (ZYRTEC) 10 MG tablet Take 10 mg by mouth every morning.   Marland Kitchen HUMALOG KWIKPEN 100 UNIT/ML KwikPen Inject 5-6 Units into the skin 3 (three) times daily.   Marland Kitchen LANTUS SOLOSTAR 100 UNIT/ML Solostar Pen Inject 24- 26 units subcue daily for diabetes (Patient taking differently: Inject 15 Units into the skin every evening. )  . ONETOUCH DELICA LANCETS 17O MISC Use lancet to stick finger and check blood sugar two times daily.  Glory Rosebush VERIO test strip USE AS DIRECTED.  Marland Kitchen pravastatin (PRAVACHOL) 40 MG tablet Take 1 tablet (40 mg total) by mouth daily.  . traMADol (ULTRAM) 50 MG tablet Take 1 tablet (50 mg total) by mouth every 6 (six) hours as needed  for moderate pain or severe pain.  . [DISCONTINUED] carvedilol (COREG) 6.25 MG tablet Take 1 tablet (6.25 mg total) by mouth 2 (two) times daily.   No facility-administered encounter medications on file as of 05/01/2020.    Allergies (verified) Adhesive [tape], Chlorhexidine gluconate, Dilantin [phenytoin], Penicillins, and Sulfa antibiotics   History: Past Medical History:  Diagnosis Date  . Bladder cancer ([place])    [place]  . Breast cancer ([place])   . DJD (degenerative joint disease)    NECK, NEEDS PILLOW WHEN LYING FLAT IS POSSIBLE  . History of cerebral aneurysm repair    1996-- S/P CLAPPING RIGHT CAROTID --- NO DEFICITS  . 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 Hyperlipidemia   . Hypertension   . Liver hemangioma   . OA (osteoarthritis)   . PAC (premature atrial contraction)   . PONV (postoperative nausea and vomiting)   . Right trigger finger   . Type 2 diabetes mellitus ([place])   . Wears glasses    Past Surgical History:  Procedure Laterality Date  . ABDOMINAL HYSTERECTOMY  1990   PARTIAL  . BREAST LUMPECTOMY WITH RADIOACTIVE SEED AND SENTINEL LYMPH NODE BIOPSY Right 04/17/2020   Procedure: BREAST LUMPECTOMY X 2  WITH RADIOACTIVE SEED AND SENTINEL LYMPH NODE BIOPSY;  Surgeon: Coralie Keens, MD;  [facility];  Service: General;  Laterality: Right;  . CATARACT EXTRACTION W/ INTRAOCULAR LENS  IMPLANT, BILATERAL  2016; 2017  . CEREBRAL ANEURYSM REPAIR  1996   clipped-no deficits RIGHT CAROTID  . CHOLECYSTECTOMY  2010  . CYSTOSCOPY W/ RETROGRADES Right 06/09/2017   Procedure: CYSTOSCOPY WITH RETROGRADE PYELOGRAM;  Surgeon: Alexis Frock, MD;  Location: [facility];  Service: Urology;  Laterality: Right;  . CYSTOSCOPY W/ RETROGRADES Right 07/21/2017   Procedure: CYSTOSCOPY WITH RETROGRADE PYELOGRAM;  Surgeon: Alexis Frock, MD;  [facility];  Service: Urology;  Laterality: Right;  . CYSTOSCOPY WITH RETROGRADE PYELOGRAM, URETEROSCOPY AND STENT PLACEMENT Left 08/26/2016   Procedure: CYSTOSCOPY WITH BILATERAL RETROGRADE PYELOGRAM,LEFT URETEROSCOPY WITH BIOPSY AND LEFT STENT PLACEMENT;  Surgeon: Alexis Frock, MD;  Location: [facility];  Service: Urology;  Laterality: Left;  . ROBOT ASSITED LAPAROSCOPIC NEPHROURETERECTOMY Left 10/09/2016   Procedure: XI ROBOT ASSITED LAPAROSCOPIC NEPHROURETERECTOMY;  Surgeon: Alexis Frock, MD;  [facility];  Service: Urology;  Laterality: Left;  . SURGERY ON FOOT FOR STAPH INFECTION  1989  . TRANSURETHRAL RESECTION OF BLADDER TUMOR N/A 07/21/2017   Procedure: TRANSURETHRAL RESECTION OF BLADDER TUMOR (TURBT);  Surgeon: Alexis Frock, MD;  Location[facility];  Service: Urology;  Laterality: N/A;  . TRANSURETHRAL RESECTION OF BLADDER TUMOR WITH MITOMYCIN-C N/A 06/09/2017   Procedure: TRANSURETHRAL RESECTION OF BLADDER TUMOR WITH MITOMYCIN-C;  Surgeon: Alexis Frock, MD;  Location: [facility];  Service: Urology;  Laterality: N/A;  . TUBAL LIGATION  1980   Family History  Problem Relation Age of Onset  . Cancer Mother   . Hyperlipidemia Father   . Heart attack Father   . Diabetes Brother   . Breast cancer Neg Hx    Social History   Socioeconomic History  . Marital status: Married    Spouse name: Not on file  . Number of children: Not on file  . Years of education: Not on file  . Highest education level: Not on file  Occupational History  . Not on file  Tobacco Use  . Smoking status: Never Smoker  . Smokeless tobacco: Never Used  Vaping Use  . Vaping Use: Never used  Substance and Sexual Activity  . Alcohol use: Yes    Comment: , STOPPED  . Drug use: No  . Sexual activity: Yes  Other Topics Concern  . Not on file  Social History Narrative  . Not on file   Social Determinants of Health   Financial Resource  Strain: Low Risk   . Difficulty of Paying Living Expenses: Not hard at all  Food Insecurity: No Food Insecurity  . Worried About Charity fundraiser in the Last Year: Never true  . Ran Out of Food in the Last Year: Never true  Transportation Needs: No Transportation Needs  . Lack of Transportation (Medical): No  . Lack of Transportation (Non-Medical): No  Physical Activity:   . Days of Exercise per Week:   . Minutes of Exercise per Session:   Stress:   . Feeling of Stress :   Social Connections:   . Frequency of Communication with Friends and Family:   . Frequency of Social Gatherings with Friends and Family:   . Attends Religious Services:   . Active Member of Clubs or Organizations:   . Attends Archivist Meetings:   Marland Kitchen Marital Status:     Tobacco Counseling Counseling given: Not Answered   Clinical Intake:  Pain : No/denies pain    Activities of Daily Living In your present state of health, do you have any difficulty performing the following activities: 05/01/2020 04/11/2020  Hearing? N N  Vision? N N  Difficulty concentrating or making decisions? N N  Walking or climbing stairs? Y Y  Dressing or bathing? N N  Doing errands, shopping? N N  Preparing Food and eating ? N -  Using the Toilet? N -  In the past six months, have you accidently leaked urine? N -  Do you have problems with loss of bowel control? N -  Managing your Medications? N -  Managing your Finances? N -  Housekeeping or managing your Housekeeping? N -  Some recent data might be hidden    Patient Care Team: Copland, Gay Filler, MD as PCP - General (Family Medicine) Alexis Frock, MD as Consulting Physician (Urology) Rockwell Germany, RN as Oncology Nurse Navigator Mauro Kaufmann, RN as Oncology Nurse Navigator Coralie Keens, MD as Consulting Physician (General Surgery) Magrinat, Virgie Dad, MD as Consulting Physician (Oncology) Kyung Rudd, MD as Consulting Physician (Radiation  Oncology) Madelin Rear, MD as Consulting Physician (Endocrinology) Madelon Lips, MD as Consulting Physician (Nephrology)  Indicate any recent Medical Services you may have received from other than [facility] providers in the past year (date may be approximate).     Assessment:   This is a routine wellness examination for Tasha Tucker.  Hearing/Vision screen  Hearing Screening   125Hz  250Hz  500Hz  1000Hz  2000Hz  3000Hz  4000Hz  6000Hz  8000Hz   Right ear:           Left ear:           Comments: Passed whisper test    Visual Acuity Screening   Right eye Left eye Both eyes  Without correction: 20/20 20/20 20/20   With correction:       Dietary issues and exercise activities discussed: Current Exercise Habits: The patient does not participate in regular exercise at present, Exercise limited by: None identified  Diet (meal preparation, eat out, water intake, caffeinated beverages, dairy products, fruits and vegetables): well balanced       Goals    . Increase physical activity      Depression Screen PHQ 2/9 Scores 05/01/2020 11/01/2019 10/12/2018  PHQ - 2 Score 0 0 0    Fall Risk Fall Risk  05/01/2020 11/01/2019 10/12/2018  Falls in the past year? 1 0 0  Number falls in past yr: 0 - -  Injury with Fall? 0 - -  Follow up Education provided;Falls prevention discussed - -   Lives with husband. 1 story home.  Any stairs in or around the home? Yes  If so, are there any without handrails? Yes  Home free of loose throw rugs in walkways, pet beds, electrical cords, etc? Yes  Adequate lighting in your home to reduce risk of falls? Yes   ASSISTIVE DEVICES UTILIZED TO PREVENT FALLS:  Life alert? No  Use of a cane, walker or w/c? No  Grab bars in the bathroom? No  Shower chair or bench in shower? No  Elevated toilet seat or a handicapped toilet? No   Gait steady and fast without use of assistive device  Cognitive Function: Ad8 score reviewed for issues:  Issues making decisions:no  Less  interest in hobbies / activities:no  Repeats questions, stories (family complaining):no  Trouble using ordinary gadgets (microwave, computer, phone):no  Forgets the month or year: no  Mismanaging finances: no  Remembering appts:no  Daily  problems with thinking and/or memory:no Ad8 score is=0         Immunizations Immunization History  Administered Date(s) Administered  . Influenza, High Dose Seasonal PF 05/18/2017  . Influenza,inj,Quad PF,6+ Mos 06/18/2018  . Influenza-Unspecified 06/13/2019  . PFIZER SARS-COV-2 Vaccination 10/18/2019, 11/08/2019  . Pneumococcal Conjugate-13 08/07/2013  . Pneumococcal Polysaccharide-23 05/06/2009, 06/06/2013  . Tdap 01/24/2016  . Zoster 05/20/2011  . Zoster Recombinat (Shingrix) 06/13/2019, 02/09/2020    TDAP status: Up to date Flu Vaccine status: Up to date Pneumococcal vaccine status: Up to date Covid-19 vaccine status: Completed vaccines  Qualifies for Shingles Vaccine? Yes   Zostavax completed Yes   Shingrix Completed?: Yes  Screening Tests Health Maintenance  Topic Date Due  . URINE MICROALBUMIN  04/20/2020  . INFLUENZA VACCINE  04/28/2020  . HEMOGLOBIN A1C  10/12/2020  . FOOT EXAM  10/31/2020  . OPHTHALMOLOGY EXAM  12/17/2020  . Fecal DNA (Cologuard)  07/27/2021  . MAMMOGRAM  03/13/2022  . TETANUS/TDAP  01/21/2028  . DEXA SCAN  Completed  . COVID-19 Vaccine  Completed  . Hepatitis C Screening  Completed  . PNA vac Low Risk Adult  Completed    Health Maintenance  Health Maintenance Due  Topic Date Due  . URINE MICROALBUMIN  04/20/2020  . INFLUENZA VACCINE  04/28/2020    Colorectal cancer screening: Completed Cologuard 07/27/18. Repeat every 3 years Mammogram status: Completed 03/26/20. Repeat every year Bone Density status: Completed 03/30/18. Results reflect: Bone density results: NORMAL. Repeat every 2 years.  Lung Cancer Screening: (Low Dose CT Chest recommended if Age 36-80 years, 30 pack-year currently  smoking OR have quit w/in 15years.) does not qualify.    Additional Screening:  Hepatitis C Screening: does qualify; Completed 03/21/18  Vision Screening: Recommended annual ophthalmology exams for early detection of glaucoma and other disorders of the eye. Is the patient up to date with their annual eye exam?  Yes  Who is the provider or what is the name of the office in which the patient attends annual eye exams? Dr.Parker   Dental Screening: Recommended annual dental exams for proper oral hygiene  Community Resource Referral / Chronic Care Management: CRR required this visit?  No   CCM required this visit?  No      Plan:    Please schedule your next medicare wellness visit with me in 1 yr.  Continue to eat heart healthy diet (full of fruits, vegetables, whole grains, lean protein, water--limit salt, fat, and sugar intake) and increase physical activity as tolerated.  Continue doing brain stimulating activities (puzzles, reading, adult coloring books, staying active) to keep memory sharp.    I have personally reviewed and noted the following in the patient's chart:   . Medical and social history . Use of alcohol, tobacco or illicit drugs  . Current medications and supplements . Functional ability and status . Nutritional status . Physical activity . Advanced directives . List of other physicians . Hospitalizations, surgeries, and ER visits in previous 12 months . Vitals . Screenings to include cognitive, depression, and falls . Referrals and appointments  In addition, I have reviewed and discussed with patient certain preventive protocols, quality metrics, and best practice recommendations. A written personalized care plan for preventive services as well as general preventive health recommendations were provided to patient.     Shela Nevin, [place]   05/01/2020

## 2020-05-01 ENCOUNTER — Encounter: Payer: Self-pay | Admitting: Family Medicine

## 2020-05-01 ENCOUNTER — Encounter: Payer: Self-pay | Admitting: *Deleted

## 2020-05-01 ENCOUNTER — Other Ambulatory Visit: Payer: Self-pay

## 2020-05-01 ENCOUNTER — Ambulatory Visit (INDEPENDENT_AMBULATORY_CARE_PROVIDER_SITE_OTHER): Payer: Medicare Other | Admitting: Family Medicine

## 2020-05-01 ENCOUNTER — Ambulatory Visit (INDEPENDENT_AMBULATORY_CARE_PROVIDER_SITE_OTHER): Payer: Medicare Other | Admitting: *Deleted

## 2020-05-01 VITALS — BP 130/60 | HR 60 | Temp 98.3°F | Ht 68.0 in | Wt 229.0 lb

## 2020-05-01 VITALS — BP 130/60 | HR 60 | Temp 98.3°F | Resp 18 | Wt 229.2 lb

## 2020-05-01 DIAGNOSIS — N183 Chronic kidney disease, stage 3 unspecified: Secondary | ICD-10-CM

## 2020-05-01 DIAGNOSIS — C50411 Malignant neoplasm of upper-outer quadrant of right female breast: Secondary | ICD-10-CM | POA: Diagnosis not present

## 2020-05-01 DIAGNOSIS — Z0000 Encounter for general adult medical examination without abnormal findings: Secondary | ICD-10-CM

## 2020-05-01 DIAGNOSIS — I159 Secondary hypertension, unspecified: Secondary | ICD-10-CM

## 2020-05-01 DIAGNOSIS — Z170 Estrogen receptor positive status [ER+]: Secondary | ICD-10-CM

## 2020-05-01 DIAGNOSIS — Z794 Long term (current) use of insulin: Secondary | ICD-10-CM

## 2020-05-01 DIAGNOSIS — E118 Type 2 diabetes mellitus with unspecified complications: Secondary | ICD-10-CM

## 2020-05-01 DIAGNOSIS — E782 Mixed hyperlipidemia: Secondary | ICD-10-CM

## 2020-05-01 MED ORDER — CARVEDILOL 6.25 MG PO TABS: 6.2500 mg | ORAL_TABLET | Freq: Two times a day (BID) | ORAL | 3 refills | Status: DC

## 2020-05-01 NOTE — Patient Instructions (Signed)
Please schedule your next medicare wellness visit with me in 1 yr.  Continue to eat heart healthy diet (full of fruits, vegetables, whole grains, lean protein, water--limit salt, fat, and sugar intake) and increase physical activity as tolerated.  Continue doing brain stimulating activities (puzzles, reading, adult coloring books, staying active) to keep memory sharp.    Tasha Tucker , Thank you for taking time to come for your Medicare Wellness Visit. I appreciate your ongoing commitment to your health goals. Please review the following plan we discussed and let me know if I can assist you in the future.   These are the goals we discussed: Goals     Increase physical activity       This is a list of the screening recommended for you and due dates:  Health Maintenance  Topic Date Due   Urine Protein Check  04/20/2020   Flu Shot  04/28/2020   Hemoglobin A1C  10/12/2020   Complete foot exam   10/31/2020   Eye exam for diabetics  12/17/2020   Cologuard (Stool DNA test)  07/27/2021   Mammogram  03/13/2022   Tetanus Vaccine  01/21/2028   DEXA scan (bone density measurement)  Completed   COVID-19 Vaccine  Completed    Hepatitis C: One time screening is recommended by Center for Disease Control  (CDC) for  adults born from [birth year] through 1967.   Completed   Pneumonia vaccines  Completed    Preventive Care 27 Years and Older, Female Preventive care refers to lifestyle choices and visits with your health care provider that can promote health and wellness. This includes:  A yearly physical exam. This is also called an annual well check.  Regular dental and eye exams.  Immunizations.  Screening for certain conditions.  Healthy lifestyle choices, such as diet and exercise. What can I expect for my preventive care visit? Physical exam Your health care provider will check:  Height and weight. These may be used to calculate body mass index (BMI), which is a measurement  that tells if you are at a healthy weight.  Heart rate and blood pressure.  Your skin for abnormal spots. Counseling Your health care provider may ask you questions about:  Alcohol, tobacco, and drug use.  Emotional well-being.  Home and relationship well-being.  Sexual activity.  Eating habits.  History of falls.  Memory and ability to understand (cognition).  Work and work Statistician.  Pregnancy and menstrual history. What immunizations do I need?  Influenza (flu) vaccine  This is recommended every year. Tetanus, diphtheria, and pertussis (Tdap) vaccine  You may need a Td booster every 10 years. Varicella (chickenpox) vaccine  You may need this vaccine if you have not already been vaccinated. Zoster (shingles) vaccine  You may need this after age 39. Pneumococcal conjugate (PCV13) vaccine  One dose is recommended after age 67. Pneumococcal polysaccharide (PPSV23) vaccine  One dose is recommended after age 14. Measles, mumps, and rubella (MMR) vaccine  You may need at least one dose of MMR if you were born in 1957 or later. You may also need a second dose. Meningococcal conjugate (MenACWY) vaccine  You may need this if you have certain conditions. Hepatitis A vaccine  You may need this if you have certain conditions or if you travel or work in places where you may be exposed to hepatitis A. Hepatitis B vaccine  You may need this if you have certain conditions or if you travel or work in places where you  may be exposed to hepatitis B. Haemophilus influenzae type b (Hib) vaccine  You may need this if you have certain conditions. You may receive vaccines as individual doses or as more than one vaccine together in one shot (combination vaccines). Talk with your health care provider about the risks and benefits of combination vaccines. What tests do I need? Blood tests  Lipid and cholesterol levels. These may be checked every 5 years, or more frequently  depending on your overall health.  Hepatitis C test.  Hepatitis B test. Screening  Lung cancer screening. You may have this screening every year starting at age 85 if you have a 30-pack-year history of smoking and currently smoke or have quit within the past 15 years.  Colorectal cancer screening. All adults should have this screening starting at age 59 and continuing until age 89. Your health care provider may recommend screening at age 63 if you are at increased risk. You will have tests every 1-10 years, depending on your results and the type of screening test.  Diabetes screening. This is done by checking your blood sugar (glucose) after you have not eaten for a while (fasting). You may have this done every 1-3 years.  Mammogram. This may be done every 1-2 years. Talk with your health care provider about how often you should have regular mammograms.  BRCA-related cancer screening. This may be done if you have a family history of breast, ovarian, tubal, or peritoneal cancers. Other tests  Sexually transmitted disease (STD) testing.  Bone density scan. This is done to screen for osteoporosis. You may have this done starting at age 50. Follow these instructions at home: Eating and drinking  Eat a diet that includes fresh fruits and vegetables, whole grains, lean protein, and low-fat dairy products. Limit your intake of foods with high amounts of sugar, saturated fats, and salt.  Take vitamin and mineral supplements as recommended by your health care provider.  Do not drink alcohol if your health care provider tells you not to drink.  If you drink alcohol: ? Limit how much you have to 0-1 drink a day. ? Be aware of how much alcohol is in your drink. In the [place] one drink equals one 12 oz bottle of beer (355 mL), one 5 oz glass of wine (148 mL), or one 1 oz glass of hard liquor (44 mL). Lifestyle  Take daily care of your teeth and gums.  Stay active. Exercise for at least 30  minutes on 5 or more days each week.  Do not use any products that contain nicotine or tobacco, such as cigarettes, e-cigarettes, and chewing tobacco. If you need help quitting, ask your health care provider.  If you are sexually active, practice safe sex. Use a condom or other form of protection in order to prevent STIs (sexually transmitted infections).  Talk with your health care provider about taking a low-dose aspirin or statin. What's next?  Go to your health care provider once a year for a well check visit.  Ask your health care provider how often you should have your eyes and teeth checked.  Stay up to date on all vaccines. This information is not intended to replace advice given to you by your health care provider. Make sure you discuss any questions you have with your health care provider. Document Revised: 09/08/2018 Document Reviewed: 09/08/2018 Elsevier Patient Education  2020 [employer].

## 2020-05-03 ENCOUNTER — Encounter: Payer: Self-pay | Admitting: Oncology

## 2020-05-03 ENCOUNTER — Encounter (HOSPITAL_COMMUNITY): Payer: Self-pay | Admitting: Oncology

## 2020-05-06 ENCOUNTER — Encounter: Payer: Self-pay | Admitting: *Deleted

## 2020-05-06 ENCOUNTER — Telehealth: Payer: Self-pay | Admitting: Oncology

## 2020-05-06 ENCOUNTER — Other Ambulatory Visit: Payer: Self-pay | Admitting: Oncology

## 2020-05-06 ENCOUNTER — Telehealth: Payer: Self-pay | Admitting: *Deleted

## 2020-05-06 NOTE — Telephone Encounter (Signed)
Scheduled per 8/9 sch message. Pt is aware of appt time and date.

## 2020-05-06 NOTE — Progress Notes (Signed)
I called Tasha Tucker to give her the results of her Oncotype which are high risk.  She is going to need chemotherapy.  Most likely since this is a lobular cancer and estrogen receptor positive we will go with CMF x8 but she will have other choices.

## 2020-05-06 NOTE — Telephone Encounter (Signed)
Spoke with Tasha Tucker, recent breast surgery went well no cancer was found and the lymph node were clear.  Will havew some radiation to make sure all cells are gone.

## 2020-05-10 ENCOUNTER — Other Ambulatory Visit: Payer: Self-pay | Admitting: Surgery

## 2020-05-13 ENCOUNTER — Encounter: Payer: Self-pay | Admitting: Physical Therapy

## 2020-05-13 ENCOUNTER — Ambulatory Visit: Payer: Medicare Other | Attending: Surgery | Admitting: Physical Therapy

## 2020-05-13 ENCOUNTER — Other Ambulatory Visit: Payer: Self-pay

## 2020-05-13 DIAGNOSIS — R293 Abnormal posture: Secondary | ICD-10-CM | POA: Diagnosis present

## 2020-05-13 DIAGNOSIS — Z483 Aftercare following surgery for neoplasm: Secondary | ICD-10-CM | POA: Diagnosis present

## 2020-05-13 DIAGNOSIS — Z170 Estrogen receptor positive status [ER+]: Secondary | ICD-10-CM | POA: Insufficient documentation

## 2020-05-13 DIAGNOSIS — C50411 Malignant neoplasm of upper-outer quadrant of right female breast: Secondary | ICD-10-CM | POA: Diagnosis not present

## 2020-05-13 NOTE — Therapy (Signed)
[facility] [address] [place], [place], [address] Phone: [phone]   Fax:  [phone]  Physical Therapy Treatment  Patient Details  Name: Tasha Tucker MRN: [id number] Date of Birth: [birth year] Referring Provider (PT): Dr. Abigail Miyamoto   Encounter Date: 05/13/2020   PT End of Session - 05/13/20 1433    Visit Number 2    Number of Visits 2    PT Start Time 1402    PT Stop Time 1440    PT Time Calculation (min) 38 min    Activity Tolerance Patient tolerated treatment well    Behavior During Therapy [facility] for tasks assessed/performed           Past Medical History:  Diagnosis Date  . Bladder cancer (HCC)    UROLOGIST- DR [facility]  . Breast cancer (HCC)   . DJD (degenerative joint disease)    NECK, NEEDS PILLOW WHEN LYING FLAT IS POSSIBLE  . History of cerebral aneurysm repair    1996-- S/P CLAPPING RIGHT CAROTID --- NO DEFICITS  . 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 Hyperlipidemia   . Hypertension   . Liver hemangioma   . OA (osteoarthritis)   . PAC (premature atrial contraction)   . PONV (postoperative nausea and vomiting)   . Right trigger finger   . Type 2 diabetes mellitus (HCC)   . Wears glasses     Past Surgical History:  Procedure Laterality Date  . ABDOMINAL HYSTERECTOMY  1990   PARTIAL  . BREAST LUMPECTOMY WITH RADIOACTIVE SEED AND SENTINEL LYMPH NODE BIOPSY Right 04/17/2020   Procedure: BREAST LUMPECTOMY X 2  WITH RADIOACTIVE SEED AND SENTINEL LYMPH NODE BIOPSY;  Surgeon: Abigail Miyamoto, MD;  Location: [facility];  Service: General;  Laterality: Right;  . CATARACT EXTRACTION W/ INTRAOCULAR LENS  IMPLANT, BILATERAL  2016; 2017  . CEREBRAL ANEURYSM REPAIR  1996   clipped-no deficits RIGHT CAROTID  . CHOLECYSTECTOMY  2010  . CYSTOSCOPY W/ RETROGRADES Right 06/09/2017   Procedure: CYSTOSCOPY WITH  RETROGRADE PYELOGRAM;  Surgeon: Sebastian Ache, MD;  Location[facility];  Service: Urology;  Laterality: Right;  . CYSTOSCOPY W/ RETROGRADES Right 07/21/2017   Procedure: CYSTOSCOPY WITH RETROGRADE PYELOGRAM;  Surgeon: Sebastian Ache, MD;  Location: [facility];  Service: Urology;  Laterality: Right;  . CYSTOSCOPY WITH RETROGRADE PYELOGRAM, URETEROSCOPY AND STENT PLACEMENT Left 08/26/2016   Procedure: CYSTOSCOPY WITH BILATERAL RETROGRADE PYELOGRAM,LEFT URETEROSCOPY WITH BIOPSY AND LEFT STENT PLACEMENT;  Surgeon: Sebastian Ache, MD;  Location: [facility];  Service: Urology;  Laterality: Left;  . ROBOT ASSITED LAPAROSCOPIC NEPHROURETERECTOMY Left 10/09/2016   Procedure: XI ROBOT ASSITED LAPAROSCOPIC NEPHROURETERECTOMY;  Surgeon: Sebastian Ache, MD;  Location: [facility];  Service: Urology;  Laterality: Left;  . SURGERY ON FOOT FOR STAPH INFECTION  1989  . TRANSURETHRAL RESECTION OF BLADDER TUMOR N/A 07/21/2017   Procedure: TRANSURETHRAL RESECTION OF BLADDER TUMOR (TURBT);  Surgeon: Sebastian Ache, MD;  Location: [facility];  Service: Urology;  Laterality: N/A;  . TRANSURETHRAL RESECTION OF BLADDER TUMOR WITH MITOMYCIN-C N/A 06/09/2017   Procedure: TRANSURETHRAL RESECTION OF BLADDER TUMOR WITH MITOMYCIN-C;  Surgeon: Sebastian Ache, MD;  Location: [facility];  Service: Urology;  Laterality: N/A;  . TUBAL LIGATION  1980    There were no vitals filed for this visit.   Subjective Assessment - 05/13/20 1406    Subjective  Patient reports she underwent a right double lumpectomy with a sentinel node biopsy (1 negative node) on 04/17/2020. Her Oncotype score is 29 so chemo is recommended but she meets with her oncologist on 05/21/2020.    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 She underwent a right double lumpectomy with a sentinel node biopsy (1 negative  node) on 04/17/2020. It is ER/PR positive and HER2 negative with a Ki67 of 5%. She has a history of bladder and renal carcinoma from 06/2017.    Patient Stated Goals See how my arm is doing ok    Currently in Pain? No/denies              [facility] PT Assessment - 05/13/20 0001      Assessment   Medical Diagnosis s/p right lumpectomy and SLNB    Referring Provider (PT) Dr. Coralie Keens    Onset Date/Surgical Date 04/17/20    Hand Dominance Right    Prior Therapy Baselines      Precautions   Precautions Other (comment)    Precaution Comments recent surgery      Restrictions   Weight Bearing Restrictions No      [place] residence    Living Arrangements Spouse/significant other    Available Help at Discharge Family      Prior Function   Level of [place] Retired    Leisure She has not returned to walking yet since surgery      Cognition   Overall Cognitive Status Within Functional Limits for tasks assessed      Observation/Other Assessments   Observations Right axillary and breast incisions appear to be healing well. There is some hardness present on the lateral aspect of her axillary incision which appears to be scar tissue. Breast incision is still healing somewhat with glue present.      Posture/Postural Control   Posture/Postural Control Postural limitations    Postural Limitations Rounded Shoulders;Forward head      ROM / Strength   AROM / PROM / Strength AROM      AROM   AROM Assessment Site Shoulder    Right/Left Shoulder Right    Right Shoulder Extension 58 Degrees    Right Shoulder Flexion 150 Degrees    Right Shoulder ABduction 163 Degrees    Right Shoulder Internal Rotation 62 Degrees    Right Shoulder External Rotation 78 Degrees      Strength   Overall Strength Within functional limits for tasks performed             LYMPHEDEMA/ONCOLOGY QUESTIONNAIRE - 05/13/20 0001      Type    Cancer Type Right breast cancer      Surgeries   Lumpectomy Date 04/17/20    Sentinel Lymph Node Biopsy Date 04/17/20    Number Lymph Nodes Removed 1      Treatment   Active Chemotherapy Treatment No    Past Chemotherapy Treatment No    Active Radiation Treatment No    Past Radiation Treatment No    Current Hormone Treatment No    Past Hormone Therapy No      What other symptoms do you have   Are you Having Heaviness or Tightness No    Are you having Pain No    Are you having pitting edema No    Is it Hard or Difficult finding clothes that fit No  Do you have infections No    Is there Decreased scar mobility No    [place] Sign No      Lymphedema Assessments   Lymphedema Assessments Upper extremities      Right Upper Extremity Lymphedema   10 cm Proximal to Olecranon Process 32.5 cm    Olecranon Process 25.5 cm    10 cm Proximal to Ulnar Styloid Process 23.2 cm    Just Proximal to Ulnar Styloid Process 16.3 cm    Across Hand at [employer] 18.2 cm    At [place] of 2nd Digit 6.6 cm      Left Upper Extremity Lymphedema   10 cm Proximal to Olecranon Process 33 cm    Olecranon Process 26 cm    10 cm Proximal to Ulnar Styloid Process 23.4 cm    Just Proximal to Ulnar Styloid Process 16.5 cm    Across Hand at [employer] 18.9 cm    At [place] of 2nd Digit 6.4 cm              Quick Dash - 05/13/20 0001    Open a tight or new jar No difficulty    Do heavy household chores (wash walls, wash floors) No difficulty    Carry a shopping bag or briefcase No difficulty    Wash your back No difficulty    Use a knife to cut food No difficulty    Recreational activities in which you take some force or impact through your arm, shoulder, or hand (golf, hammering, tennis) No difficulty    During the past week, to what extent has your arm, shoulder or hand problem interfered with your normal social activities with family, friends, neighbors, or groups? Not at all    During the  past week, to what extent has your arm, shoulder or hand problem limited your work or other regular daily activities Not at all    Arm, shoulder, or hand pain. None    Tingling (pins and needles) in your arm, shoulder, or hand None    Difficulty Sleeping No difficulty    DASH Score 0 %                          PT Education - 05/13/20 1432    Education Details Lymphedema risk reduction and post op shoulder ROM; follow up to care for herself post op    Person(s) Educated Patient    Methods Explanation;Demonstration;Handout    Comprehension Returned demonstration;Verbalized understanding               PT Long Term Goals - 05/13/20 1444      PT LONG TERM GOAL #1   Title Patient will dmeonstrate she has regained full shoulder ROM and function post operatively compared to baselines.    Time 8    Period Weeks    Status Achieved                 Plan - 05/13/20 1436    Clinical Impression Statement Patient is doing very well s/p right lumpectomy and sentinel node biopsy on 04/17/2020. Her shoulder ROM and function is back to baseline, she has no signs of lymphedema, and her incisions are healing well. She was educated on the importance of exercise and how to care for herself post op. She was also educated on lymphedema risk reduction. She will benefit from L-Dex screenings every 3 months but otherwise has no needs  for PT.    PT Treatment/Interventions ADLs/Self Care Home Management;Patient/family education;Therapeutic exercise    PT Next Visit Plan D/C    PT Home Exercise Plan Post op shoulder ROM HEP    Consulted and Agree with Plan of Care Patient           Patient will benefit from skilled therapeutic intervention in order to improve the following deficits and impairments:  Decreased knowledge of precautions, Impaired UE functional use, Pain, Postural dysfunction, Decreased range of motion, Decreased scar mobility  Visit Diagnosis: Malignant neoplasm of  upper-outer quadrant of right breast in female, estrogen receptor positive (HCC)  Abnormal posture  Aftercare following surgery for neoplasm     Problem List Patient Active Problem List   Diagnosis Date Noted  . Malignant neoplasm of upper-outer quadrant of right breast in female, estrogen receptor positive ([place]) 03/28/2020  . Controlled type 2 diabetes mellitus with complication, with long-term current use of insulin ([name]) 03/19/2018  . Hyperlipidemia 01/12/2018  . Cerebral aneurysm 12/29/2017  . Secondary hypertension, unspecified 12/29/2017  . CKD (chronic kidney disease) stage 4, GFR 15-29 ml/min (HCC) 12/29/2017  . Cancer of renal pelvis, left ([place]) 10/12/2016  . Renal mass 10/09/2016   PHYSICAL THERAPY DISCHARGE SUMMARY  Visits from Start of Care: 2  Current functional level related to goals / functional outcomes: See above for objective findings. Goals met.   Remaining deficits: None   Education / Equipment: HEP and lymphedema risk reduction education. Plan: Patient agrees to discharge.  Patient goals were met. Patient is being discharged due to meeting the stated rehab goals.  ?????         Annia Friendly, [place] 05/13/20 2:45 PM  [place], [place], [address] Phone: [phone]   Fax:  [phone]  Name: Tasha Tucker MRN: [id number] Date of Birth: 02-Oct-1950

## 2020-05-13 NOTE — Patient Instructions (Signed)
            [facility]         [address],  [address]         [phone]         Annia Friendly, PT, CLT   After Breast Cancer Class It is recommended you attend the ABC class to be educated on lymphedema risk reduction. This class is free of charge and lasts for 1 hour. It is a 1-time class. Since you don't want to do this, I'll give you the information and you can call me if you have questions.   Scar massage You can begin gentle scar massage with coconut oil or vitamin E cream on both incisions to reduce the hardness of your scars.   Home exercise Program Your range of motion is back to normal. If you feel like your shoulder gets tight during radiation, you can resume the exercises.   Follow up PT: It is recommended you return every 3 months for the first 3 years following surgery to be assessed on the SOZO machine for an L-Dex score. This helps prevent clinically significant lymphedema in 95% of patients. These follow up screens are 15 minute appointments that you are not billed for. You are scheduled for October 25th at 9:15 am.

## 2020-05-21 ENCOUNTER — Inpatient Hospital Stay: Payer: Medicare Other | Attending: Oncology | Admitting: Oncology

## 2020-05-21 ENCOUNTER — Other Ambulatory Visit: Payer: Self-pay

## 2020-05-21 VITALS — BP 131/64 | HR 78 | Temp 96.4°F | Resp 18 | Ht 68.0 in | Wt 227.7 lb

## 2020-05-21 DIAGNOSIS — C50511 Malignant neoplasm of lower-outer quadrant of right female breast: Secondary | ICD-10-CM | POA: Diagnosis not present

## 2020-05-21 DIAGNOSIS — E785 Hyperlipidemia, unspecified: Secondary | ICD-10-CM | POA: Diagnosis not present

## 2020-05-21 DIAGNOSIS — C50411 Malignant neoplasm of upper-outer quadrant of right female breast: Secondary | ICD-10-CM

## 2020-05-21 DIAGNOSIS — C652 Malignant neoplasm of left renal pelvis: Secondary | ICD-10-CM | POA: Diagnosis not present

## 2020-05-21 DIAGNOSIS — Z8551 Personal history of malignant neoplasm of bladder: Secondary | ICD-10-CM | POA: Diagnosis not present

## 2020-05-21 DIAGNOSIS — Z794 Long term (current) use of insulin: Secondary | ICD-10-CM | POA: Diagnosis not present

## 2020-05-21 DIAGNOSIS — M1990 Unspecified osteoarthritis, unspecified site: Secondary | ICD-10-CM | POA: Diagnosis not present

## 2020-05-21 DIAGNOSIS — Z79899 Other long term (current) drug therapy: Secondary | ICD-10-CM | POA: Diagnosis not present

## 2020-05-21 DIAGNOSIS — E118 Type 2 diabetes mellitus with unspecified complications: Secondary | ICD-10-CM | POA: Diagnosis not present

## 2020-05-21 DIAGNOSIS — Z170 Estrogen receptor positive status [ER+]: Secondary | ICD-10-CM | POA: Insufficient documentation

## 2020-05-21 DIAGNOSIS — Z923 Personal history of irradiation: Secondary | ICD-10-CM | POA: Insufficient documentation

## 2020-05-21 DIAGNOSIS — E119 Type 2 diabetes mellitus without complications: Secondary | ICD-10-CM | POA: Insufficient documentation

## 2020-05-21 DIAGNOSIS — Z9221 Personal history of antineoplastic chemotherapy: Secondary | ICD-10-CM | POA: Insufficient documentation

## 2020-05-21 DIAGNOSIS — N184 Chronic kidney disease, stage 4 (severe): Secondary | ICD-10-CM

## 2020-05-21 DIAGNOSIS — I10 Essential (primary) hypertension: Secondary | ICD-10-CM | POA: Insufficient documentation

## 2020-05-21 MED ORDER — PROCHLORPERAZINE MALEATE 10 MG PO TABS: 10.0000 mg | ORAL_TABLET | Freq: Four times a day (QID) | ORAL | 1 refills | Status: DC | PRN

## 2020-05-21 MED ORDER — LIDOCAINE-PRILOCAINE 2.5-2.5 % EX CREA: TOPICAL_CREAM | CUTANEOUS | 3 refills | Status: DC

## 2020-05-21 NOTE — Progress Notes (Signed)
[place]  [phone]) [phone] [phone]     ID: Tasha Tucker DOB: 1951/08/22  MR#: [id number]  VCB#:[id number]  Patient Care Team: Darreld Mclean, MD as PCP - General (Family Medicine) Alexis Frock, MD as Consulting Physician (Urology) Rockwell Germany, RN as Oncology Nurse Navigator Mauro Kaufmann, RN as Oncology Nurse Navigator Coralie Keens, MD as Consulting Physician (General Surgery) Kahleel Fadeley, Virgie Dad, MD as Consulting Physician (Oncology) Kyung Rudd, MD as Consulting Physician (Radiation Oncology) Madelin Rear, MD as Consulting Physician (Endocrinology) Madelon Lips, MD as Consulting Physician (Nephrology) Chauncey Cruel, MD OTHER MD:  CHIEF COMPLAINT: Estrogen receptor positive breast cancer  CURRENT TREATMENT: Adjuvant chemotherapy and radiation therapy   INTERVAL HISTORY: Tasha Tucker returns today for follow up of her estrogen receptor positive breast cancer. She was evaluated in the multidisciplinary breast cancer clinic on 04/03/2020.   Since her last visit here she underwent right lumpectomy and sentinel lymph node biopsy 04/17/2020 for a 4.3 cm invasive lobular carcinoma, grade 2, with negative margins.  The single sentinel lymph node was negative.  Oncotype was obtained from this material and showed a recurrence score of 29, predicting a risk of recurrence outside the breast of 18% if the patient's only systemic therapy was antiestrogens.  It also predicts a greater than 15% chemotherapy benefit.   REVIEW OF SYSTEMS: Tasha Tucker did well with her surgery, with no significant pain, bleeding, or fever.  She enjoyed physical therapy and feels she learned a great deal from it.  She is not ready to consider radiation.  She tells me her blood sugars require constant attention and our major concern to her.  She reminded me that she only has 1 kidney.  She had many questions regarding chemotherapy.  A detailed review of systems today  was otherwise stable.  HISTORY OF CURRENT ILLNESS: From the original intake note:  Tasha Tucker had routine screening mammography on 03/13/2020 showing a possible abnormality in the right breast. She underwent right diagnostic mammography with tomography and right breast ultrasonography at [facility] on 03/26/2020 showing: breast density category B; suspicious 1.9 cm mass/distortion in right breast at 9 o'clock; no right axillary lymphadenopathy.  Accordingly that same day, she proceeded to biopsy of the right breast area in question. The pathology from this procedure ([id number]) showed: invasive and in situ mammary carcinoma, grade 2, e-cadherin negative. Prognostic indicators significant for: estrogen receptor, 90% positive and progesterone receptor, 90% positive, both with strong staining intensity. Proliferation marker Ki67 at 5%. HER2 negative by immunohistochemistry (1+).  The patient's subsequent history is as detailed below.   PAST MEDICAL HISTORY: Past Medical History:  Diagnosis Date  . Bladder cancer ([place])    [place]  . Breast cancer ([place])   . DJD (degenerative joint disease)    NECK, NEEDS PILLOW WHEN LYING FLAT IS POSSIBLE  . History of cerebral aneurysm repair    1996-- S/P CLAPPING RIGHT CAROTID --- NO DEFICITS  . 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 Hyperlipidemia   . Hypertension   . Liver hemangioma   . OA (osteoarthritis)   . PAC (premature atrial contraction)   . PONV (postoperative nausea and vomiting)   . Right trigger finger   . Type 2 diabetes mellitus ([place])   . Wears glasses     PAST SURGICAL HISTORY: Past Surgical History:  Procedure Laterality Date  . ABDOMINAL  HYSTERECTOMY  1990   PARTIAL  . BREAST LUMPECTOMY WITH RADIOACTIVE SEED AND SENTINEL LYMPH NODE BIOPSY Right 04/17/2020   Procedure: BREAST LUMPECTOMY X 2  WITH RADIOACTIVE SEED  AND SENTINEL LYMPH NODE BIOPSY;  Surgeon: Coralie Keens, MD;  Location: [facility];  Service: General;  Laterality: Right;  . CATARACT EXTRACTION W/ INTRAOCULAR LENS  IMPLANT, BILATERAL  2016; 2017  . CEREBRAL ANEURYSM REPAIR  1996   clipped-no deficits RIGHT CAROTID  . CHOLECYSTECTOMY  2010  . CYSTOSCOPY W/ RETROGRADES Right 06/09/2017   Procedure: CYSTOSCOPY WITH RETROGRADE PYELOGRAM;  Surgeon: Alexis Frock, MD;  Location[facility];  Service: Urology;  Laterality: Right;  . CYSTOSCOPY W/ RETROGRADES Right 07/21/2017   Procedure: CYSTOSCOPY WITH RETROGRADE PYELOGRAM;  Surgeon: Alexis Frock, MD;  Location[facility];  Service: Urology;  Laterality: Right;  . CYSTOSCOPY WITH RETROGRADE PYELOGRAM, URETEROSCOPY AND STENT PLACEMENT Left 08/26/2016   Procedure: CYSTOSCOPY WITH BILATERAL RETROGRADE PYELOGRAM,LEFT URETEROSCOPY WITH BIOPSY AND LEFT STENT PLACEMENT;  Surgeon: Alexis Frock, MD;  Location: [facility];  Service: Urology;  Laterality: Left;  . ROBOT ASSITED LAPAROSCOPIC NEPHROURETERECTOMY Left 10/09/2016   Procedure: XI ROBOT ASSITED LAPAROSCOPIC NEPHROURETERECTOMY;  Surgeon: Alexis Frock, MD;  Location: [facility];  Service: Urology;  Laterality: Left;  . SURGERY ON FOOT FOR STAPH INFECTION  1989  . TRANSURETHRAL RESECTION OF BLADDER TUMOR N/A 07/21/2017   Procedure: TRANSURETHRAL RESECTION OF BLADDER TUMOR (TURBT);  Surgeon: Alexis Frock, MD;  Location: [facility];  Service: Urology;  Laterality: N/A;  . TRANSURETHRAL RESECTION OF BLADDER TUMOR WITH MITOMYCIN-C N/A 06/09/2017   Procedure: TRANSURETHRAL RESECTION OF BLADDER TUMOR WITH MITOMYCIN-C;  Surgeon: Alexis Frock, MD;  Location: [facility];  Service: Urology;  Laterality: N/A;  . TUBAL LIGATION  1980    FAMILY HISTORY: Family History  Problem Relation Age of Onset  . Cancer Mother   . Hyperlipidemia Father   . Heart attack Father   .  Diabetes Brother   . Breast cancer Neg Hx    Her father died at age 41 from ASCVD. Her mother died at age 66. She reports her mother had a tumor in her thigh at age 15, possibly a sarcoma. The patient has one brother, with no history of cancer. Her son, [name], had brain cancer (oligodedroglioma, possibly) and is doing well now at age 27 (as of 03/2020).   GYNECOLOGIC HISTORY:  No LMP recorded. Patient has had a hysterectomy. Menarche: 69 years old Age at first live birth: 69 years old [place] P 3 LMP 1990 Contraceptive: used pills for 8 years without issues HRT never used  Hysterectomy? Yes, 1990 BSO? 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 Husband Liliane Channel is a retired Garment/textile technologist for the [employer]. She lives at home with husband Liliane Channel and their cat, Erma. Daughter Bryson Ha, age 64, works as a Careers adviser in [place]. Son [name], age 46, works in Human resources officer in [employer]. Son Ruby Cola, age 19, lives in [place] and was previously a paramedic. Tasha Tucker has 4 grandchildren. She attends a [employer].     ADVANCED DIRECTIVES: In the absence of any documentation to the contrary, the patient's spouse is their HCPOA.    HEALTH MAINTENANCE: Social History   Tobacco Use  . Smoking status: Never Smoker  . Smokeless tobacco: Never Used  Vaping Use  . Vaping Use: Never used  Substance Use Topics  .  Alcohol use: Yes    Comment: , STOPPED  . Drug use: No     Colonoscopy: Cologuard 2020 was negative  PAP: date unknown, s/p hysterectomy  Bone density: 03/2018, +1.5   Allergies  Allergen Reactions  . Adhesive [Tape] Rash    PAPER TAPE ONLY  . Chlorhexidine Gluconate Itching and Rash    Topical antiseptic (CHG WIPES)   . Dilantin [Phenytoin] Rash  . Penicillins Rash    Has patient had a PCN reaction causing immediate rash, facial/tongue/throat swelling, SOB or lightheadedness with  hypotension:unsure Has patient had a PCN reaction causing severe rash involving mucus membranes or skin necrosis:unsure Has patient had a PCN reaction that required hospitalization:No Has patient had a PCN reaction occurring within the last 10 years:No If all of the above answers are "NO", then may proceed with Cephalosporin use.   . Sulfa Antibiotics Rash    Current Outpatient Medications  Medication Sig Dispense Refill  . acetaminophen (TYLENOL) 500 MG tablet Take 500 mg by mouth daily.     Marland Kitchen anastrozole (ARIMIDEX) 1 MG tablet Take 1 tablet (1 mg total) by mouth daily. 90 tablet 4  . BD PEN NEEDLE NANO U/F 32G X 4 MM MISC USE FOR INSULIN ADMINISTRATION DAILY 100 each 3  . carvedilol (COREG) 6.25 MG tablet Take 1 tablet (6.25 mg total) by mouth 2 (two) times daily. 180 tablet 3  . cetirizine (ZYRTEC) 10 MG tablet Take 10 mg by mouth every morning.     Marland Kitchen HUMALOG KWIKPEN 100 UNIT/ML KwikPen Inject 5-6 Units into the skin 3 (three) times daily.     Marland Kitchen LANTUS SOLOSTAR 100 UNIT/ML Solostar Pen Inject 24- 26 units subcue daily for diabetes (Patient taking differently: Inject 15 Units into the skin every evening. ) 15 mL 6  . ONETOUCH DELICA LANCETS 53G MISC Use lancet to stick finger and check blood sugar two times daily. 100 each 6  . ONETOUCH VERIO test strip USE AS DIRECTED. 300 each 2  . pravastatin (PRAVACHOL) 40 MG tablet Take 1 tablet (40 mg total) by mouth daily. 90 tablet 3  . traMADol (ULTRAM) 50 MG tablet Take 1 tablet (50 mg total) by mouth every 6 (six) hours as needed for moderate pain or severe pain. 20 tablet 0   No current facility-administered medications for this visit.    OBJECTIVE: White woman who appears stated age 34:   05/21/20 1540  BP: 131/64  Pulse: 78  Resp: 18  Temp: (!) 96.4 F (35.8 C)  SpO2: 97%     Body mass index is 34.62 kg/m.   Wt Readings from Last 3 Encounters:  05/21/20 227 lb 11.2 oz (103.3 kg)  05/01/20 229 lb (103.9 kg)  05/01/20 229 lb  4 oz (104 kg)      ECOG FS:1 - Symptomatic but completely ambulatory  Sclerae unicteric, EOMs intact Wearing a mask No cervical or supraclavicular adenopathy Lungs no rales or rhonchi Heart regular rate and rhythm Abd soft, nontender, positive bowel sounds MSK no focal spinal tenderness, no upper extremity lymphedema Neuro: nonfocal, well oriented, appropriate affect Breasts: The right breast is status post lumpectomy.  There is minimal eschar formation and some residual erythema not suggestive of cellulitis.  Overall though the cosmetic result is good.  Left breast and both axillae are benign.   LAB RESULTS:  CMP     Component Value Date/Time   NA 139 04/11/2020 1112   NA 140 04/21/2019 0000   K 4.3 04/11/2020 1112  CL 106 04/11/2020 1112   CO2 22 04/11/2020 1112   GLUCOSE 197 (H) 04/11/2020 1112   BUN 27 (H) 04/11/2020 1112   BUN 36 (A) 08/30/2017 0000   CREATININE 1.74 (H) 04/11/2020 1112   CREATININE 1.91 (H) 04/03/2020 0746   CALCIUM 10.0 04/11/2020 1112   PROT 7.2 04/03/2020 0746   ALBUMIN 3.4 (L) 04/03/2020 0746   AST 25 04/03/2020 0746   ALT 30 04/03/2020 0746   ALKPHOS 75 04/03/2020 0746   BILITOT 0.5 04/03/2020 0746   GFRNONAA 29 (L) 04/11/2020 1112   GFRNONAA 26 (L) 04/03/2020 0746   GFRAA 34 (L) 04/11/2020 1112   GFRAA 30 (L) 04/03/2020 0746    No results found for: TOTALPROTELP, ALBUMINELP, A1GS, A2GS, BETS, BETA2SER, GAMS, MSPIKE, SPEI  Lab Results  Component Value Date   WBC 6.5 04/11/2020   NEUTROABS 3.5 04/03/2020   HGB 15.8 (H) 04/11/2020   HCT 47.2 (H) 04/11/2020   MCV 92.0 04/11/2020   PLT 163 04/11/2020    No results found for: LABCA2  No components found for: GEXBMW413  No results for input(s): INR in the last 168 hours.  No results found for: LABCA2  No results found for: KGM010  No results found for: UVO536  No results found for: UYQ034  No results found for: CA2729  No components found for: HGQUANT  No results found  for: CEA1 / No results found for: CEA1   No results found for: AFPTUMOR  No results found for: CHROMOGRNA  No results found for: KPAFRELGTCHN, LAMBDASER, KAPLAMBRATIO (kappa/lambda light chains)  No results found for: HGBA, HGBA2QUANT, HGBFQUANT, HGBSQUAN (Hemoglobinopathy evaluation)   No results found for: LDH  No results found for: IRON, TIBC, IRONPCTSAT (Iron and TIBC)  No results found for: FERRITIN  Urinalysis    Component Value Date/Time   COLORURINE RED (A) 08/16/2016 0450   APPEARANCEUR CLOUDY (A) 08/16/2016 0450   LABSPEC 1.014 08/16/2016 0450   PHURINE 6.0 08/16/2016 0450   GLUCOSEU NEGATIVE 08/16/2016 0450   HGBUR LARGE (A) 08/16/2016 0450   BILIRUBINUR negative 06/27/2018 0934   KETONESUR negative 06/27/2018 [place] 08/16/2016 0450   PROTEINUR negative 06/27/2018 0934   PROTEINUR 100 (A) 08/16/2016 0450   UROBILINOGEN negative (A) 06/27/2018 0934   UROBILINOGEN 1.0 04/29/2013 1455   NITRITE Negative 06/27/2018 0934   NITRITE NEGATIVE 08/16/2016 0450   LEUKOCYTESUR Small (1+) (A) 06/27/2018 0934     STUDIES: No results found.   ELIGIBLE FOR AVAILABLE RESEARCH PROTOCOL: AET  ASSESSMENT: 69 y.o. [place] woman status post right breast upper outer quadrant biopsy 03/26/2020, for a clinical T1c N0, stage IA invasive lobular carcinoma, E-cadherin negative, grade 2, estrogen and progesterone receptor positive, with an MIB-1 of 5% and no HER-2 amplification  (a) unable to obtain breast MRI due to renal concern with contrast status post nephrectomy  (1) status post right lumpectomy and sentinel lymph node sampling 04/17/2020 for a pT2 pN0, stage IB invasive lobular carcinoma, grade 2, with negative margins  (a) a single right axillary lymph node was removed.  (2) Oncotype score of 29 predicts a risk of recurrence outside the breast within the next 9 years of 18% with antiestrogens only, and also a greater than 15% benefit  from chemotherapy.  (3) chemotherapy will consist of cyclophosphamide, methotrexate and fluorouracil given every 21 days x 8, starting 06/19/2020  (4) adjuvant radiation can be given concurrently with chemotherapy  (5) anastrozole started 04/03/2020 with anticipation of surgical  delays, to be interrupted during chemotherapy   PLAN: edwina has an early stage breast cancer which nevertheless proved to be more dangerous than anticipated.  Specifically she has a significant risk of developing stage IV, incurable disease despite her excellent surgery.  Of course the possibility of disseminated microscopic disease in any individual patient is something that cannot be assessed.  We treated group and the group she is in has an 18% risk of developing stage IV breast cancer if the only systemic therapy is anastrozole.  We discussed 3 chemotherapy options.  She understands the standard of care is cyclophosphamide and doxorubicin followed by paclitaxel.  The concerns to her there are the possibility of heart involvement and the fact that steroids would be required.  Similarly with cyclophosphamide and Taxotere, steroids would be required and she is very concerned this would make her diabetes unmanageable and actually shorten her life than prolonging it.  For that reason we discussed CMF in detail.  She understands this does not involve the potential for heart damage and does not require steroids for nausea or side effect control.  Many patients tolerate this very well with minimal hair loss minimal nausea and minimal low counts.  She would like to proceed with this but she is scheduled for a trip between September 12 and 18.  She can have her port placed before those dates we can get started on 06/19/2020  I am scheduling her to meet with our chemotherapy teaching nurse to get further information on this treatment  Otherwise she will see [place] on 06/19/2020, and then on day 8 to assess her initial tolerance of  treatment.  Total encounter time 40 minutes.   Virgie Dad. Janeane Cozart, MD 05/21/2020 3:55 PM Medical Oncology and Hematology [facility] [place], [place] [address] Tel. [phone]    Fax. [phone]   This document serves as a record of services personally performed by Lurline Del, MD. It was created on his behalf by Wilburn Mylar, a trained medical scribe. The creation of this record is based on the scribe's personal observations and the provider's statements to them.   I, Lurline Del MD, have reviewed the above documentation for accuracy and completeness, and I agree with the above.   *Total Encounter Time as defined by the Centers for Medicare and Medicaid Services includes, in addition to the face-to-face time of a patient visit (documented in the note above) non-face-to-face time: obtaining and reviewing outside history, ordering and reviewing medications, tests or procedures, care coordination (communications with other health care professionals or caregivers) and documentation in the medical record.

## 2020-05-21 NOTE — Progress Notes (Signed)
START OFF PATHWAY REGIMEN - Breast   [id number]:CMF (IV cyclophosphamide) q21 days:   A cycle is every 21 days:     Cyclophosphamide      Methotrexate      Fluorouracil   **Always confirm dose/schedule in your pharmacy ordering system**  Patient Characteristics: Postoperative without Neoadjuvant Therapy (Pathologic Staging), Invasive Disease, Adjuvant Therapy, HER2 Negative/Unknown/Equivocal, ER Positive, Node Negative, pT1a-c, pN0/N68mi or pT2 or Higher, pN0, Oncotype High Risk (? 26) Therapeutic Status: Postoperative without Neoadjuvant Therapy (Pathologic Staging) AJCC Grade: G2 AJCC N Category: pN0 AJCC M Category: cM0 ER Status: Positive (+) AJCC 8 Stage Grouping: IA HER2 Status: Negative (-) Oncotype Dx Recurrence Score: 29 AJCC T Category: pT2 PR Status: Positive (+) Has this patient completed genomic testing<= Yes - Oncotype DX(R) Intent of Therapy: Curative Intent, Discussed with Patient

## 2020-05-22 ENCOUNTER — Telehealth: Payer: Self-pay | Admitting: Oncology

## 2020-05-22 ENCOUNTER — Encounter: Payer: Self-pay | Admitting: *Deleted

## 2020-05-22 ENCOUNTER — Other Ambulatory Visit: Payer: Self-pay | Admitting: Surgery

## 2020-05-22 NOTE — Telephone Encounter (Signed)
No 8/23 los. No changes made to pt's schedule.  °

## 2020-05-24 ENCOUNTER — Telehealth: Payer: Self-pay | Admitting: Oncology

## 2020-05-24 NOTE — Telephone Encounter (Signed)
Scheduled appt per 8/26 sch msg - pt is aware of apts.

## 2020-05-27 ENCOUNTER — Inpatient Hospital Stay: Payer: Medicare Other

## 2020-05-27 ENCOUNTER — Encounter: Payer: Self-pay | Admitting: *Deleted

## 2020-05-27 ENCOUNTER — Inpatient Hospital Stay: Payer: Medicare Other | Admitting: Oncology

## 2020-05-27 ENCOUNTER — Other Ambulatory Visit: Payer: Self-pay

## 2020-05-28 ENCOUNTER — Telehealth: Payer: Self-pay | Admitting: *Deleted

## 2020-05-28 NOTE — Telephone Encounter (Signed)
This RN sent scheduling request for change in appointments per pt's request for 10/13 and 11/24.

## 2020-06-05 ENCOUNTER — Telehealth: Payer: Self-pay | Admitting: *Deleted

## 2020-06-05 ENCOUNTER — Encounter: Payer: Self-pay | Admitting: *Deleted

## 2020-06-05 NOTE — Congregational Nurse Program (Signed)
Elke asking if she can take the flu vaccine along with her chemo and covid.  Explained to her that the covid and flu vaccines are two different strains of viruses and that it should be alright for her to take the flu vaccine.  Encouraged her to ask her oncologist at her next visit that is this week.

## 2020-06-13 NOTE — Progress Notes (Signed)
Pharmacist Chemotherapy Monitoring - Initial Assessment    Anticipated start date: 06/19/2020   Regimen:  . Are orders appropriate based on the patient's diagnosis, regimen, and cycle? Yes . Does the plan date match the patient's scheduled date? Yes . Is the sequencing of drugs appropriate? Yes . Are the premedications appropriate for the patient's regimen? Yes . Prior Authorization for treatment is: Approved o If applicable, is the correct biosimilar selected based on the patient's insurance? not applicable  Organ Function and Labs: Marland Kitchen Are dose adjustments needed based on the patient's renal function, hepatic function, or hematologic function? Yes . Are appropriate labs ordered prior to the start of patient's treatment? Yes . Other organ system assessment, if indicated: N/A . The following baseline labs, if indicated, have been ordered: N/A  Dose Assessment: . Are the drug doses appropriate? Yes . Are the following correct: o Drug concentrations Yes o IV fluid compatible with drug Yes o Administration routes Yes o Timing of therapy Yes . If applicable, does the patient have documented access for treatment and/or plans for port-a-cath placement? no . If applicable, have lifetime cumulative doses been properly documented and assessed? yes Lifetime Dose Tracking  No doses have been documented on this patient for the following tracked chemicals: Doxorubicin, Epirubicin, Idarubicin, Daunorubicin, Mitoxantrone, Bleomycin, Oxaliplatin, Carboplatin, Liposomal Doxorubicin  o   Toxicity Monitoring/Prevention: . The patient has the following take home antiemetics prescribed: Prochlorperazine . The patient has the following take home medications prescribed: N/A . Medication allergies and previous infusion related reactions, if applicable, have been reviewed and addressed. no . The patient's current medication list has been assessed for drug-drug interactions with their chemotherapy regimen. no  significant drug-drug interactions were identified on review.  Order Review: . Are the treatment plan orders signed? Yes . Is the patient scheduled to see a provider prior to their treatment? Yes  I verify that I have reviewed each item in the above checklist and answered each question accordingly.  Ligia Duguay D 06/13/2020 11:52 AM

## 2020-06-14 NOTE — Progress Notes (Signed)
[id number] - [place], [place] - [address] [place] [address] Phone: [phone] Fax: [phone]      Your procedure is scheduled on Tuesday, 06/18/2020.  Report to [facility] Main Entrance "A" at 05:30 A.M., and check in at the Admitting office.  Call this number if you have problems the morning of surgery:  [phone]  Call [phone] if you have any questions prior to your surgery date Monday-Friday 8am-4pm    Remember:  Do not eat after midnight the night before your surgery  You may drink clear liquids until 04:30am the morning of your surgery.   Clear liquids allowed are: Water, Non-Citrus Juices (without pulp), Carbonated Beverages, Clear Tea, Black Coffee Only, and Gatorade  Please complete the Gatorade that was provided to you by 04:30am the morning of surgery.  Please, if able, drink it in one sitting. DO NOT SIP.      Take these medicines the morning of surgery with A SIP OF WATER: Anastrozole (Arimidex) Carvedilol (Coreg) Cetirizine (Zyrtec) Pravastatin (Pravachol) Prochlorperazine (Compazine) - if needed  As of today, STOP taking any Aspirin (unless otherwise instructed by your surgeon) Aleve, Naproxen, Ibuprofen, Motrin, Advil, Goody's, BC's, all herbal medications, fish oil, and all vitamins.   WHAT DO I DO ABOUT MY DIABETES MEDICATION?   . THE DAY BEFORE SURGERY, take usual doses of Humalog before meals.  . THE NIGHT BEFORE SURGERY, take 8 units of Lantus insulin.     . THE MORNING OF SURGERY, do not take any Humalog unless your CBG is greater than 220 mg/dL.  Then you may take  of your sliding scale (correction) dose of insulin.   HOW TO MANAGE YOUR DIABETES BEFORE AND AFTER SURGERY  Why is it important to control my blood sugar before and after surgery? . Improving blood sugar levels before and after surgery helps healing and can limit problems. . A way of improving blood sugar control is eating a healthy diet  by: o  Eating less sugar and carbohydrates o  Increasing activity/exercise o  Talking with your doctor about reaching your blood sugar goals . High blood sugars (greater than 180 mg/dL) can raise your risk of infections and slow your recovery, so you will need to focus on controlling your diabetes during the weeks before surgery. . Make sure that the doctor who takes care of your diabetes knows about your planned surgery including the date and location.  How do I manage my blood sugar before surgery? . Check your blood sugar at least 4 times a day, starting 2 days before surgery, to make sure that the level is not too high or low. . Check your blood sugar the morning of your surgery when you wake up and every 2 hours until you get to the Short Stay unit. o If your blood sugar is less than 70 mg/dL, you will need to treat for low blood sugar: - Do not take insulin. - Treat a low blood sugar (less than 70 mg/dL) with  cup of clear juice (cranberry or apple), 4 glucose tablets, OR glucose gel. - Recheck blood sugar in 15 minutes after treatment (to make sure it is greater than 70 mg/dL). If your blood sugar is not greater than 70 mg/dL on recheck, call [phone] for further instructions. . Report your blood sugar to the short stay nurse when you get to Short Stay.  . If you are admitted to the hospital after surgery: o Your blood sugar will  be checked by the staff and you will probably be given insulin after surgery (instead of oral diabetes medicines) to make sure you have good blood sugar levels. o The goal for blood sugar control after surgery is 80-180 mg/dL.                      Do not wear jewelry, make up, or nail polish            Do not wear lotions, powders, perfumes, or deodorant.            Do not shave 48 hours prior to surgery.              Do not bring valuables to the hospital.            [facility] is not responsible for any belongings or valuables.  Do NOT Smoke  (Tobacco/Vaping) or drink Alcohol 24 hours prior to your procedure  If you use a CPAP at night, you may bring all equipment for your overnight stay.   Contacts, glasses, dentures or bridgework may not be worn into surgery.      For patients admitted to the hospital, discharge time will be determined by your treatment team.   Patients discharged the day of surgery will not be allowed to drive home, and someone needs to stay with them for 24 hours.    Special instructions:   [facility]- Preparing For Surgery  Before surgery, you can play an important role. Because skin is not sterile, your skin needs to be as free of germs as possible. You can reduce the number of germs on your skin by washing with CHG (chlorahexidine gluconate) Soap before surgery.  CHG is an antiseptic cleaner which kills germs and bonds with the skin to continue killing germs even after washing.    Oral Hygiene is also important to reduce your risk of infection.  Remember - BRUSH YOUR TEETH THE MORNING OF SURGERY WITH YOUR REGULAR TOOTHPASTE  Please do not use if you have an allergy to CHG or antibacterial soaps. If your skin becomes reddened/irritated stop using the CHG.  Do not shave (including legs and underarms) for at least 48 hours prior to first CHG shower. It is OK to shave your face.  Please follow these instructions carefully.   1. Shower the NIGHT BEFORE SURGERY and the MORNING OF SURGERY with CHG Soap.   2. If you chose to wash your hair, wash your hair first as usual with your normal shampoo.  3. After you shampoo, rinse your hair and body thoroughly to remove the shampoo.  4. Use CHG as you would any other liquid soap. You can apply CHG directly to the skin and wash gently with a scrungie or a clean washcloth.   5. Apply the CHG Soap to your body ONLY FROM THE NECK DOWN.  Do not use on open wounds or open sores. Avoid contact with your eyes, ears, mouth and genitals (private parts). Wash Face and  genitals (private parts)  with your normal soap.   6. Wash thoroughly, paying special attention to the area where your surgery will be performed.  7. Thoroughly rinse your body with warm water from the neck down.  8. DO NOT shower/wash with your normal soap after using and rinsing off the CHG Soap.  9. Pat yourself dry with a CLEAN TOWEL.  10. Wear CLEAN PAJAMAS to bed the night before surgery  11. Place CLEAN SHEETS on  your bed the night of your first shower and DO NOT SLEEP WITH PETS.   Day of Surgery: Shower with CHG soap as directed Wear Clean/Comfortable clothing the morning of surgery Do not apply any deodorants/lotions.   Remember to brush your teeth WITH YOUR REGULAR TOOTHPASTE.   Please read over the following fact sheets that you were given.

## 2020-06-17 ENCOUNTER — Other Ambulatory Visit: Payer: Self-pay

## 2020-06-17 ENCOUNTER — Encounter (HOSPITAL_COMMUNITY)
Admission: RE | Admit: 2020-06-17 | Discharge: 2020-06-17 | Disposition: A | Discharge status: Home / Self Care | Payer: Medicare Other | Source: Ambulatory Visit | Attending: Surgery | Admitting: Surgery

## 2020-06-17 ENCOUNTER — Other Ambulatory Visit (HOSPITAL_COMMUNITY)
Admission: RE | Admit: 2020-06-17 | Discharge: 2020-06-17 | Disposition: A | Discharge status: Home / Self Care | Payer: Medicare Other | Source: Ambulatory Visit | Attending: Surgery | Admitting: Surgery

## 2020-06-17 ENCOUNTER — Other Ambulatory Visit: Payer: Self-pay | Admitting: *Deleted

## 2020-06-17 ENCOUNTER — Encounter (HOSPITAL_COMMUNITY): Payer: Self-pay

## 2020-06-17 DIAGNOSIS — Z01812 Encounter for preprocedural laboratory examination: Secondary | ICD-10-CM | POA: Insufficient documentation

## 2020-06-17 DIAGNOSIS — Z170 Estrogen receptor positive status [ER+]: Secondary | ICD-10-CM

## 2020-06-17 DIAGNOSIS — Z20822 Contact with and (suspected) exposure to covid-19: Secondary | ICD-10-CM | POA: Insufficient documentation

## 2020-06-17 LAB — BASIC METABOLIC PANEL
Anion gap: 9 (ref 5–15)
BUN: 24 mg/dL — ABNORMAL HIGH (ref 8–23)
CO2: 25 mmol/L (ref 22–32)
Calcium: 9.4 mg/dL (ref 8.9–10.3)
Chloride: 103 mmol/L (ref 98–111)
Creatinine, Ser: 1.7 mg/dL — ABNORMAL HIGH (ref 0.44–1.00)
GFR calc Af Amer: 35 mL/min — ABNORMAL LOW (ref >60)
GFR calc non Af Amer: 30 mL/min — ABNORMAL LOW (ref >60)
Glucose, Bld: 210 mg/dL — ABNORMAL HIGH (ref 70–99)
Potassium: 4.4 mmol/L (ref 3.5–5.1)
Sodium: 137 mmol/L (ref 135–145)

## 2020-06-17 LAB — CBC
HCT: 43.5 % (ref 36.0–46.0)
Hemoglobin: 14.5 g/dL (ref 12.0–15.0)
MCH: 30.6 pg (ref 26.0–34.0)
MCHC: 33.3 g/dL (ref 30.0–36.0)
MCV: 91.8 fL (ref 80.0–100.0)
Platelets: 149 10*3/uL — ABNORMAL LOW (ref 150–400)
RBC: 4.74 MIL/uL (ref 3.87–5.11)
RDW: 12.2 % (ref 11.5–15.5)
WBC: 5.8 10*3/uL (ref 4.0–10.5)
nRBC: 0 % (ref 0.0–0.2)

## 2020-06-17 LAB — HEMOGLOBIN A1C
Hgb A1c MFr Bld: 7.8 % — ABNORMAL HIGH (ref 4.8–5.6)
Mean Plasma Glucose: 177.16 mg/dL

## 2020-06-17 LAB — GLUCOSE, CAPILLARY: Glucose-Capillary: 211 mg/dL — ABNORMAL HIGH (ref 70–99)

## 2020-06-17 LAB — SARS CORONAVIRUS 2 (TAT 6-24 HRS): SARS Coronavirus 2: NEGATIVE

## 2020-06-17 NOTE — Anesthesia Preprocedure Evaluation (Addendum)
Anesthesia Evaluation  Patient identified by MRN, date of birth, ID band Patient awake    Reviewed: Allergy & Precautions, H&P , NPO status , Patient's Chart, lab work & pertinent test results  History of Anesthesia Complications (+) PONV  Airway Mallampati: II  TM Distance: >3 FB Neck ROM: Full    Dental no notable dental hx. (+) Teeth Intact, Dental Advisory Given   Pulmonary neg pulmonary ROS,    Pulmonary exam normal breath sounds clear to auscultation       Cardiovascular Exercise Tolerance: Good hypertension, Pt. on medications and Pt. on home beta blockers  Rhythm:Regular Rate:Normal     Neuro/Psych negative neurological ROS  negative psych ROS   GI/Hepatic negative GI ROS, Neg liver ROS,   Endo/Other  diabetes, Insulin DependentMorbid obesity  Renal/GU Renal InsufficiencyRenal disease  negative genitourinary   Musculoskeletal  (+) Arthritis , Osteoarthritis,    Abdominal   Peds  Hematology negative hematology ROS (+)   Anesthesia Other Findings   Reproductive/Obstetrics negative OB ROS                            Anesthesia Physical Anesthesia Plan  ASA: III  Anesthesia Plan: General   Post-op Pain Management:    Induction: Intravenous  PONV Risk Score and Plan: 4 or greater and Ondansetron and Treatment may vary due to age or medical condition  Airway Management Planned: LMA  Additional Equipment:   Intra-op Plan:   Post-operative Plan: Extubation in OR  Informed Consent: I have reviewed the patients History and Physical, chart, labs and discussed the procedure including the risks, benefits and alternatives for the proposed anesthesia with the patient or authorized representative who has indicated his/her understanding and acceptance.     Dental advisory given  Plan Discussed with: CRNA  Anesthesia Plan Comments:        Anesthesia Quick Evaluation

## 2020-06-17 NOTE — H&P (Signed)
Tasha Tucker is an 69 y.o. female.   Chief Complaint: Right breast cancer, need for Port-A-Cath insertion HPI: This is a 69 year old female who is status post radioactive seed guided breast lumpectomy x2 with sentinel node biopsy for invasive lobular carcinoma of the breast.  Lymph nodes were negative but given the lobular phenotype and her high Oncotype score, the decision has been made to proceed with adjuvant chemotherapy.  She currently is doing well postoperatively and has no complaints  Past Medical History:  Diagnosis Date  . Bladder cancer ([place])    [place]  . Breast cancer ([place])   . DJD (degenerative joint disease)    NECK, NEEDS PILLOW WHEN LYING FLAT IS POSSIBLE  . History of cerebral aneurysm repair    1996-- S/P CLAPPING RIGHT CAROTID --- NO DEFICITS  . 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 Hyperlipidemia   . Hypertension   . Liver hemangioma   . OA (osteoarthritis)   . PAC (premature atrial contraction)   . PONV (postoperative nausea and vomiting)   . Right trigger finger   . Type 2 diabetes mellitus ([place])   . Wears glasses     Past Surgical History:  Procedure Laterality Date  . ABDOMINAL HYSTERECTOMY  1990   PARTIAL  . BREAST LUMPECTOMY WITH RADIOACTIVE SEED AND SENTINEL LYMPH NODE BIOPSY Right 04/17/2020   Procedure: BREAST LUMPECTOMY X 2  WITH RADIOACTIVE SEED AND SENTINEL LYMPH NODE BIOPSY;  Surgeon: Coralie Keens, MD;  Location: [facility];  Service: General;  Laterality: Right;  . CATARACT EXTRACTION W/ INTRAOCULAR LENS  IMPLANT, BILATERAL  2016; 2017  . CEREBRAL ANEURYSM REPAIR  1996   clipped-no deficits RIGHT CAROTID  . CHOLECYSTECTOMY  2010  . CYSTOSCOPY W/ RETROGRADES Right 06/09/2017   Procedure: CYSTOSCOPY WITH RETROGRADE PYELOGRAM;  Surgeon: Alexis Frock, MD;  [facility];  Service: Urology;  Laterality: Right;  .  CYSTOSCOPY W/ RETROGRADES Right 07/21/2017   Procedure: CYSTOSCOPY WITH RETROGRADE PYELOGRAM;  Surgeon: Alexis Frock, MD;  Location: [facility];  Service: Urology;  Laterality: Right;  . CYSTOSCOPY WITH RETROGRADE PYELOGRAM, URETEROSCOPY AND STENT PLACEMENT Left 08/26/2016   Procedure: CYSTOSCOPY WITH BILATERAL RETROGRADE PYELOGRAM,LEFT URETEROSCOPY WITH BIOPSY AND LEFT STENT PLACEMENT;  Surgeon: Alexis Frock, MD;  Location: [facility];  Service: Urology;  Laterality: Left;  . ROBOT ASSITED LAPAROSCOPIC NEPHROURETERECTOMY Left 10/09/2016   Procedure: XI ROBOT ASSITED LAPAROSCOPIC NEPHROURETERECTOMY;  Surgeon: Alexis Frock, MD;  Location: [facility];  Service: Urology;  Laterality: Left;  . SURGERY ON FOOT FOR STAPH INFECTION  1989  . TRANSURETHRAL RESECTION OF BLADDER TUMOR N/A 07/21/2017   Procedure: TRANSURETHRAL RESECTION OF BLADDER TUMOR (TURBT);  Surgeon: Alexis Frock, MD;  Location[facility];  Service: Urology;  Laterality: N/A;  . TRANSURETHRAL RESECTION OF BLADDER TUMOR WITH MITOMYCIN-C N/A 06/09/2017   Procedure: TRANSURETHRAL RESECTION OF BLADDER TUMOR WITH MITOMYCIN-C;  Surgeon: Alexis Frock, MD;  Location: [facility];  Service: Urology;  Laterality: N/A;  . TUBAL LIGATION  1980    Family History  Problem Relation Age of Onset  . Cancer Mother   . Hyperlipidemia Father   . Heart attack Father   . Diabetes Brother   . Breast cancer Neg Hx    Social History:  reports that she has never smoked. She has never used smokeless tobacco. She reports previous alcohol use. She  reports that she does not use drugs.  Allergies:  Allergies  Allergen Reactions  . Adhesive [Tape] Rash    PAPER TAPE ONLY  . Chlorhexidine Gluconate Itching and Rash    Topical antiseptic (CHG WIPES)   . Dilantin [Phenytoin] Rash  . Penicillins Rash    Has patient had a PCN reaction causing immediate rash, facial/tongue/throat swelling,  SOB or lightheadedness with hypotension:unsure Has patient had a PCN reaction causing severe rash involving mucus membranes or skin necrosis:unsure Has patient had a PCN reaction that required hospitalization:No Has patient had a PCN reaction occurring within the last 10 years:No If all of the above answers are "NO", then may proceed with Cephalosporin use.   . Sulfa Antibiotics Rash    No medications prior to admission.    Results for orders placed or performed during the hospital encounter of 06/17/20 (from the past 48 hour(s))  Glucose, capillary     Status: Abnormal   Collection Time: 06/17/20  8:43 AM  Result Value Ref Range   Glucose-Capillary 211 (H) 70 - 99 mg/dL    Comment: Glucose reference range applies only to samples taken after fasting for at least 8 hours.  CBC per protocol     Status: Abnormal   Collection Time: 06/17/20  9:15 AM  Result Value Ref Range   WBC 5.8 4.0 - 10.5 K/uL   RBC 4.74 3.87 - 5.11 MIL/uL   Hemoglobin 14.5 12.0 - 15.0 g/dL   HCT 43.5 36 - 46 %   MCV 91.8 80.0 - 100.0 fL   MCH 30.6 26.0 - 34.0 pg   MCHC 33.3 30.0 - 36.0 g/dL   RDW 12.2 11.5 - 15.5 %   Platelets 149 (L) 150 - 400 K/uL   nRBC 0.0 0.0 - 0.2 %    Comment: Performed at [facility], [place] [address] [place], [place] [address]  Basic metabolic panel per protocol     Status: Abnormal   Collection Time: 06/17/20  9:15 AM  Result Value Ref Range   Sodium 137 135 - 145 mmol/L   Potassium 4.4 3.5 - 5.1 mmol/L   Chloride 103 98 - 111 mmol/L   CO2 25 22 - 32 mmol/L   Glucose, Bld 210 (H) 70 - 99 mg/dL    Comment: Glucose reference range applies only to samples taken after fasting for at least 8 hours.   BUN 24 (H) 8 - 23 mg/dL   Creatinine, Ser 1.70 (H) 0.44 - 1.00 mg/dL   Calcium 9.4 8.9 - 10.3 mg/dL   GFR calc non Af Amer 30 (L) >60 mL/min   GFR calc Af Amer 35 (L) >60 mL/min   Anion gap 9 5 - 15    Comment: Performed at [facility] [address] [place],  [place] [address]  Hemoglobin A1c per protocol     Status: Abnormal   Collection Time: 06/17/20  9:15 AM  Result Value Ref Range   Hgb A1c MFr Bld 7.8 (H) 4.8 - 5.6 %    Comment: (NOTE) Pre diabetes:          5.7%-6.4%  Diabetes:              >6.4%  Glycemic control for   <7.0% adults with diabetes    Mean Plasma Glucose 177.16 mg/dL    Comment: Performed at [facility] [address] [place], [place] [address]   No results found.  Review of Systems  All other  systems reviewed and are negative.   There were no vitals taken for this visit. Physical Exam Constitutional:      General: She is not in acute distress.    Appearance: Normal appearance.  Eyes:     Pupils: Pupils are equal, round, and reactive to light.  Cardiovascular:     Rate and Rhythm: Normal rate and regular rhythm.     Pulses: Normal pulses.  Pulmonary:     Effort: Pulmonary effort is normal.     Breath sounds: Normal breath sounds.  Abdominal:     Palpations: Abdomen is soft.     Tenderness: There is no abdominal tenderness.  Musculoskeletal:        General: Normal range of motion.     Cervical back: Normal range of motion.  Skin:    Findings: No erythema.  Neurological:     Mental Status: She is alert.  Psychiatric:        Behavior: Behavior normal.        Thought Content: Thought content normal.      Assessment/Plan History of right breast cancer in need of chemotherapy  The plan will be to proceed with a Port-A-Cath insertion.  I discussed the procedure with the patient in detail.  I discussed the risks which include but are not limited to bleeding, infection, injury to surrounding structures, pneumothorax, the need for other procedures, port failure or malpositioning, etc.  She understands and agrees to proceed with surgery  Coralie Keens, MD 06/17/2020, 4:30 PM

## 2020-06-17 NOTE — Progress Notes (Signed)
PCP - Dr. Janett Billow Copland Endocrinologist: Dr. Nehemiah Settle Cardiologist - denies  Chest x-ray -03/26/20  EKG - 04/11/20 Stress Test - denies ECHO - denies Cardiac Cath - denies  Sleep Study - denies CPAP - denies  Fasting Blood Sugar - 135 Checks Blood Sugar ___1__ time a day CBG at PAT-211, pt states she ate a muffin for breakfast. Last A1C was 04/11/20: 7.6, will obtain one today in PAT.  Blood Thinner Instructions: n/a Aspirin Instructions:n/a  ERAS Protcol -pt to stop clear liquids by 0430 DOS. PRE-SURGERY Ensure or G2- pt provided with (1) Gatorade G2.   COVID TEST- Pt had covid test done prior to PAT appointment this morning.    Anesthesia review: No  Patient denies shortness of breath, fever, cough and chest pain at PAT appointment   All instructions explained to the patient, with a verbal understanding of the material. Patient agrees to go over the instructions while at home for a better understanding. Patient also instructed to self quarantine after being tested for COVID-19. The opportunity to ask questions was provided.    Coronavirus Screening  Have you experienced the following symptoms:  Cough yes/no: No Fever (>100.57F)  yes/no: No Runny nose yes/no: No Sore throat yes/no: No Difficulty breathing/shortness of breath  yes/no: No  Have you or a family member traveled in the last 14 days and where? Pt stated she traveled to [place] in [place] for a vacation recently. Denies being around anyone COVID + "knowingly".     If the patient indicates "YES" to the above questions, their PAT will be rescheduled to limit the exposure to others and, the surgeon will be notified. THE PATIENT WILL NEED TO BE ASYMPTOMATIC FOR 14 DAYS.   If the patient is not experiencing any of these symptoms, the PAT nurse will instruct them to NOT bring anyone with them to their appointment since they may have these symptoms or traveled as well.   Please remind your patients and  families that hospital visitation restrictions are in effect and the importance of the restrictions.

## 2020-06-18 ENCOUNTER — Ambulatory Visit (HOSPITAL_COMMUNITY): Payer: Medicare Other

## 2020-06-18 ENCOUNTER — Other Ambulatory Visit: Payer: Self-pay

## 2020-06-18 ENCOUNTER — Ambulatory Visit (HOSPITAL_COMMUNITY): Payer: Medicare Other | Admitting: Anesthesiology

## 2020-06-18 ENCOUNTER — Ambulatory Visit (HOSPITAL_COMMUNITY)
Admission: RE | Admit: 2020-06-18 | Discharge: 2020-06-18 | Disposition: A | Discharge status: Home / Self Care | Payer: Medicare Other | Attending: Surgery | Admitting: Surgery

## 2020-06-18 ENCOUNTER — Encounter (HOSPITAL_COMMUNITY)
Admission: RE | Disposition: A | Discharge status: Home / Self Care | Payer: Self-pay | Source: Home / Self Care | Attending: Surgery

## 2020-06-18 ENCOUNTER — Encounter (HOSPITAL_COMMUNITY): Payer: Self-pay | Admitting: Surgery

## 2020-06-18 DIAGNOSIS — Z882 Allergy status to sulfonamides status: Secondary | ICD-10-CM | POA: Diagnosis not present

## 2020-06-18 DIAGNOSIS — Z888 Allergy status to other drugs, medicaments and biological substances status: Secondary | ICD-10-CM | POA: Diagnosis not present

## 2020-06-18 DIAGNOSIS — Z8551 Personal history of malignant neoplasm of bladder: Secondary | ICD-10-CM | POA: Diagnosis not present

## 2020-06-18 DIAGNOSIS — E785 Hyperlipidemia, unspecified: Secondary | ICD-10-CM | POA: Insufficient documentation

## 2020-06-18 DIAGNOSIS — C50911 Malignant neoplasm of unspecified site of right female breast: Secondary | ICD-10-CM | POA: Insufficient documentation

## 2020-06-18 DIAGNOSIS — Z8553 Personal history of malignant neoplasm of renal pelvis: Secondary | ICD-10-CM | POA: Insufficient documentation

## 2020-06-18 DIAGNOSIS — Z880 Allergy status to penicillin: Secondary | ICD-10-CM | POA: Insufficient documentation

## 2020-06-18 DIAGNOSIS — I10 Essential (primary) hypertension: Secondary | ICD-10-CM | POA: Diagnosis not present

## 2020-06-18 DIAGNOSIS — E119 Type 2 diabetes mellitus without complications: Secondary | ICD-10-CM | POA: Insufficient documentation

## 2020-06-18 DIAGNOSIS — Z452 Encounter for adjustment and management of vascular access device: Secondary | ICD-10-CM

## 2020-06-18 DIAGNOSIS — Z419 Encounter for procedure for purposes other than remedying health state, unspecified: Secondary | ICD-10-CM

## 2020-06-18 HISTORY — PX: PORTACATH PLACEMENT: SHX2246

## 2020-06-18 LAB — GLUCOSE, CAPILLARY
Glucose-Capillary: 181 mg/dL — ABNORMAL HIGH (ref 70–99)
Glucose-Capillary: 218 mg/dL — ABNORMAL HIGH (ref 70–99)

## 2020-06-18 SURGERY — INSERTION, TUNNELED CENTRAL VENOUS DEVICE, WITH PORT
Anesthesia: General | Site: Chest | Laterality: Left

## 2020-06-18 MED ORDER — ACETAMINOPHEN 500 MG PO TABS: 1000.0000 mg | ORAL_TABLET | ORAL | Status: DC

## 2020-06-18 MED ORDER — BUPIVACAINE HCL (PF) 0.25 % IJ SOLN
INTRAMUSCULAR | Status: DC | PRN
  Administered 2020-06-18: 13 mL

## 2020-06-18 MED ORDER — FENTANYL CITRATE (PF) 100 MCG/2ML IJ SOLN
INTRAMUSCULAR | Status: DC | PRN
Start: 2020-06-18 — End: 2020-06-18
  Administered 2020-06-18: 25 ug via INTRAVENOUS

## 2020-06-18 MED ORDER — HEPARIN SOD (PORK) LOCK FLUSH 100 UNIT/ML IV SOLN
INTRAVENOUS | Status: DC | PRN
  Administered 2020-06-18: 500 [IU] via INTRAVENOUS

## 2020-06-18 MED ORDER — FENTANYL CITRATE (PF) 250 MCG/5ML IJ SOLN
INTRAMUSCULAR | Status: AC
  Filled 2020-06-18: qty 5

## 2020-06-18 MED ORDER — FENTANYL CITRATE (PF) 100 MCG/2ML IJ SOLN: 25.0000 ug | INTRAMUSCULAR | Status: DC | PRN

## 2020-06-18 MED ORDER — 0.9 % SODIUM CHLORIDE (POUR BTL) OPTIME
TOPICAL | Status: DC | PRN
  Administered 2020-06-18: 1000 mL

## 2020-06-18 MED ORDER — PROPOFOL 10 MG/ML IV BOLUS
INTRAVENOUS | Status: DC | PRN
  Administered 2020-06-18: 150 mg via INTRAVENOUS
  Administered 2020-06-18: 50 mg via INTRAVENOUS

## 2020-06-18 MED ORDER — CIPROFLOXACIN IN D5W 400 MG/200ML IV SOLN
400.0000 mg | INTRAVENOUS | Status: AC
  Administered 2020-06-18: 400 mg via INTRAVENOUS
  Filled 2020-06-18: qty 200

## 2020-06-18 MED ORDER — ACETAMINOPHEN 500 MG PO TABS
1000.0000 mg | ORAL_TABLET | Freq: Once | ORAL | Status: AC
  Administered 2020-06-18: 1000 mg via ORAL
  Filled 2020-06-18: qty 2

## 2020-06-18 MED ORDER — HEPARIN SOD (PORK) LOCK FLUSH 100 UNIT/ML IV SOLN
INTRAVENOUS | Status: AC
  Filled 2020-06-18: qty 5

## 2020-06-18 MED ORDER — SODIUM CHLORIDE 0.9 % IV SOLN
INTRAVENOUS | Status: AC
  Filled 2020-06-18: qty 1.2

## 2020-06-18 MED ORDER — PROPOFOL 10 MG/ML IV BOLUS
INTRAVENOUS | Status: AC
  Filled 2020-06-18: qty 20

## 2020-06-18 MED ORDER — ENSURE PRE-SURGERY PO LIQD: 296.0000 mL | Freq: Once | ORAL | Status: DC

## 2020-06-18 MED ORDER — ONDANSETRON HCL 4 MG/2ML IJ SOLN
INTRAMUSCULAR | Status: DC | PRN
  Administered 2020-06-18: 4 mg via INTRAVENOUS

## 2020-06-18 MED ORDER — LACTATED RINGERS IV SOLN: INTRAVENOUS | Status: DC

## 2020-06-18 MED ORDER — LIDOCAINE 2% (20 MG/ML) 5 ML SYRINGE
INTRAMUSCULAR | Status: DC | PRN
  Administered 2020-06-18: 60 mg via INTRAVENOUS

## 2020-06-18 MED ORDER — SODIUM CHLORIDE 0.9 % IV SOLN
INTRAVENOUS | Status: DC | PRN
  Administered 2020-06-18: 3 mL

## 2020-06-18 MED ORDER — CHLORHEXIDINE GLUCONATE 0.12 % MT SOLN
OROMUCOSAL | Status: AC
  Filled 2020-06-18: qty 15

## 2020-06-18 MED ORDER — BUPIVACAINE HCL (PF) 0.25 % IJ SOLN
INTRAMUSCULAR | Status: AC
  Filled 2020-06-18: qty 30

## 2020-06-18 SURGICAL SUPPLY — 45 items
ADH SKN CLS APL DERMABOND .7 (GAUZE/BANDAGES/DRESSINGS) ×1
APL PRP STRL LF DISP 70% ISPRP (MISCELLANEOUS) ×1
BAG DECANTER FOR FLEXI CONT (MISCELLANEOUS) ×3 IMPLANT
CHLORAPREP W/TINT 26 (MISCELLANEOUS) ×3 IMPLANT
COVER SURGICAL LIGHT HANDLE (MISCELLANEOUS) ×3 IMPLANT
COVER TRANSDUCER ULTRASND GEL (DISPOSABLE) IMPLANT
COVER WAND RF STERILE (DRAPES) ×3 IMPLANT
DERMABOND ADVANCED (GAUZE/BANDAGES/DRESSINGS) ×2
DERMABOND ADVANCED .7 DNX12 (GAUZE/BANDAGES/DRESSINGS) ×1 IMPLANT
DRAPE C-ARM 42X120 X-RAY (DRAPES) ×3 IMPLANT
DRAPE CHEST BREAST 15X10 FENES (DRAPES) ×3 IMPLANT
DRSG TEGADERM 4X4.75 (GAUZE/BANDAGES/DRESSINGS) ×3 IMPLANT
ELECT CAUTERY BLADE 6.4 (BLADE) ×3 IMPLANT
ELECT REM PT RETURN 9FT ADLT (ELECTROSURGICAL) ×3
ELECTRODE REM PT RTRN 9FT ADLT (ELECTROSURGICAL) ×1 IMPLANT
GAUZE 4X4 16PLY RFD (DISPOSABLE) ×3 IMPLANT
GAUZE SPONGE 2X2 8PLY STRL LF (GAUZE/BANDAGES/DRESSINGS) ×1 IMPLANT
GEL ULTRASOUND 20GR AQUASONIC (MISCELLANEOUS) IMPLANT
GLOVE SURG SIGNA 7.5 PF LTX (GLOVE) ×3 IMPLANT
GOWN STRL REUS W/ TWL LRG LVL3 (GOWN DISPOSABLE) ×1 IMPLANT
GOWN STRL REUS W/ TWL XL LVL3 (GOWN DISPOSABLE) ×1 IMPLANT
GOWN STRL REUS W/TWL LRG LVL3 (GOWN DISPOSABLE) ×3
GOWN STRL REUS W/TWL XL LVL3 (GOWN DISPOSABLE) ×3
INTRODUCER COOK 11FR (CATHETERS) IMPLANT
KIT BASIN OR (CUSTOM PROCEDURE TRAY) ×3 IMPLANT
KIT PORT POWER 8FR ISP CVUE (Port) ×2 IMPLANT
KIT TURNOVER KIT B (KITS) ×3 IMPLANT
NS IRRIG 1000ML POUR BTL (IV SOLUTION) ×3 IMPLANT
PAD ARMBOARD 7.5X6 YLW CONV (MISCELLANEOUS) ×3 IMPLANT
PENCIL BUTTON HOLSTER BLD 10FT (ELECTRODE) ×3 IMPLANT
POSITIONER HEAD DONUT 9IN (MISCELLANEOUS) ×3 IMPLANT
SET INTRODUCER 12FR PACEMAKER (INTRODUCER) IMPLANT
SET SHEATH INTRODUCER 10FR (MISCELLANEOUS) IMPLANT
SHEATH COOK PEEL AWAY SET 9F (SHEATH) IMPLANT
SPONGE GAUZE 2X2 STER 10/PKG (GAUZE/BANDAGES/DRESSINGS) ×2
SUT MNCRL AB 4-0 PS2 18 (SUTURE) ×3 IMPLANT
SUT PROLENE 2 0 SH 30 (SUTURE) ×3 IMPLANT
SUT SILK 2 0 (SUTURE)
SUT SILK 2-0 18XBRD TIE 12 (SUTURE) IMPLANT
SUT VIC AB 3-0 SH 27 (SUTURE) ×3
SUT VIC AB 3-0 SH 27XBRD (SUTURE) ×1 IMPLANT
SYR 5ML LUER SLIP (SYRINGE) ×3 IMPLANT
TOWEL GREEN STERILE (TOWEL DISPOSABLE) ×3 IMPLANT
TOWEL GREEN STERILE FF (TOWEL DISPOSABLE) ×3 IMPLANT
TRAY LAPAROSCOPIC MC (CUSTOM PROCEDURE TRAY) ×3 IMPLANT

## 2020-06-18 NOTE — Anesthesia Procedure Notes (Signed)
Procedure Name: LMA Insertion Date/Time: 06/18/2020 7:33 AM Performed by: Trinna Post., CRNA Pre-anesthesia Checklist: Patient identified, Emergency Drugs available, Suction available, Patient being monitored and Timeout performed Patient Re-evaluated:Patient Re-evaluated prior to induction Oxygen Delivery Method: Circle system utilized Preoxygenation: Pre-oxygenation with 100% oxygen Induction Type: IV induction LMA: LMA inserted LMA Size: 4.0 Number of attempts: 1 Placement Confirmation: positive ETCO2 and breath sounds checked- equal and bilateral Tube secured with: Tape Dental Injury: Teeth and Oropharynx as per pre-operative assessment

## 2020-06-18 NOTE — Interval H&P Note (Signed)
History and Physical Interval Note:no change in H and P  06/18/2020 6:47 AM  Tasha Tucker  has presented today for surgery, with the diagnosis of BREAST CANCER.  The various methods of treatment have been discussed with the patient and family. After consideration of risks, benefits and other options for treatment, the patient has consented to  Procedure(s): INSERTION PORT-A-CATH WITH ULTRASOUND GUIDANCE (N/A) as a surgical intervention.  The patient's history has been reviewed, patient examined, no change in status, stable for surgery.  I have reviewed the patient's chart and labs.  Questions were answered to the patient's satisfaction.     Coralie Keens

## 2020-06-18 NOTE — Transfer of Care (Signed)
Immediate Anesthesia Transfer of Care Note  Patient: Tasha Tucker  Procedure(s) Performed: INSERTION PORT-A-CATH WITH ULTRASOUND GUIDANCE (Left Chest)  Patient Location: PACU  Anesthesia Type:General  Level of Consciousness: awake, alert  and oriented  Airway & Oxygen Therapy: Patient Spontanous Breathing and Patient connected to nasal cannula oxygen  Post-op Assessment: Report given to RN and Post -op Vital signs reviewed and stable  Post vital signs: Reviewed and stable  Last Vitals:  Vitals Value Taken Time  BP 128/65 06/18/20 0808  Temp    Pulse 66 06/18/20 0809  Resp 9 06/18/20 0809  SpO2 98 % 06/18/20 0809  Vitals shown include unvalidated device data.  Last Pain:  Vitals:   06/18/20 0614  TempSrc: Oral  PainSc:       Patients Stated Pain Goal: 3 (95/63/87 5643)  Complications: No complications documented.

## 2020-06-18 NOTE — Discharge Instructions (Signed)
Ok to shower starting tomorrow  Ice pack,tylenol for pain  No vigorous activity for one week

## 2020-06-18 NOTE — Anesthesia Postprocedure Evaluation (Signed)
Anesthesia Post Note  Patient: Tasha Tucker  Procedure(s) Performed: INSERTION PORT-A-CATH WITH ULTRASOUND GUIDANCE (Left Chest)     Patient location during evaluation: PACU Anesthesia Type: General Level of consciousness: awake and alert Pain management: pain level controlled Vital Signs Assessment: post-procedure vital signs reviewed and stable Respiratory status: spontaneous breathing, nonlabored ventilation and respiratory function stable Cardiovascular status: blood pressure returned to baseline and stable Postop Assessment: no apparent nausea or vomiting Anesthetic complications: no   No complications documented.  Last Vitals:  Vitals:   06/18/20 0838 06/18/20 0845  BP: (!) 152/58 (!) 158/67  Pulse: (!) 55 (!) 58  Resp: 14   Temp:    SpO2: 97% 100%    Last Pain:  Vitals:   06/18/20 0830  TempSrc:   PainSc: 0-No pain                 Montrelle Eddings,W. EDMOND

## 2020-06-18 NOTE — Progress Notes (Signed)
[facility]  [phone] [phone]     ID: BERKLEE BATTEY DOB: November 15, 1950  MR#: [id number]  VVO#:[id number]  Patient Care Team: Darreld Mclean, MD as PCP - General (Family Medicine) Alexis Frock, MD as Consulting Physician (Urology) Rockwell Germany, RN as Oncology Nurse Navigator Mauro Kaufmann, RN as Oncology Nurse Navigator Coralie Keens, MD as Consulting Physician (General Surgery) Zayden Maffei, Virgie Dad, MD as Consulting Physician (Oncology) Kyung Rudd, MD as Consulting Physician (Radiation Oncology) Madelin Rear, MD as Consulting Physician (Endocrinology) Madelon Lips, MD as Consulting Physician (Nephrology) Chauncey Cruel, MD OTHER MD:  CHIEF COMPLAINT: Estrogen receptor positive breast cancer  CURRENT TREATMENT: Adjuvant chemotherapy and radiation therapy   INTERVAL HISTORY: Sameka returns today for follow up and treatment of her estrogen receptor positive breast cancer.   She underwent port placement on 06/17/2020 in anticipation of beginning adjuvant chemotherapy today.  She tolerated that with no complications.  Chemotherapy will consist of cyclophosphamide, methotrexate and fluorouracil given every 21 days x 8 starting today.  She will be set up for radiation treatments starting sometime around her third or fourth [facility] chemotherapy cycle   REVIEW OF SYSTEMS: Tasha Tucker tells me her blood sugar rose to over 300 yesterday.  She does not have a simple explanation for this.  As far as I know she did not receive any steroids for her port placement.  She ate her normal diet and took her normal medicines.  She does think when she gets stressed her blood sugar does rise.  She has a little bit of discomfort from the port but no real pain and no unusual neck stiffness.  She feels ready to start treatment.  Detailed review of systems today was otherwise stable   HISTORY OF CURRENT ILLNESS: From the original intake note:  Tasha Tucker had routine screening mammography on 03/13/2020 showing a possible abnormality in the right breast. She underwent right diagnostic mammography with tomography and right breast ultrasonography at [facility] on 03/26/2020 showing: breast density category B; suspicious 1.9 cm mass/distortion in right breast at 9 o'clock; no right axillary lymphadenopathy.  Accordingly that same day, she proceeded to biopsy of the right breast area in question. The pathology from this procedure ([id number]) showed: invasive and in situ mammary carcinoma, grade 2, e-cadherin negative. Prognostic indicators significant for: estrogen receptor, 90% positive and progesterone receptor, 90% positive, both with strong staining intensity. Proliferation marker Ki67 at 5%. HER2 negative by immunohistochemistry (1+).  The patient's subsequent history is as detailed below.   PAST MEDICAL HISTORY: Past Medical History:  Diagnosis Date  . Bladder cancer ([place])    [place]  . Breast cancer ([place])   . DJD (degenerative joint disease)    NECK, NEEDS PILLOW WHEN LYING FLAT IS POSSIBLE  . History of cerebral aneurysm repair    1996-- S/P CLAPPING RIGHT CAROTID --- NO DEFICITS  . 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 Hyperlipidemia   . Hypertension   . Liver hemangioma   . OA (osteoarthritis)   . PAC (premature atrial contraction)   . PONV (postoperative nausea and vomiting)   . Right trigger finger   . Type 2 diabetes mellitus ([place])   . Wears glasses     PAST SURGICAL HISTORY: Past Surgical History:  Procedure Laterality Date  . ABDOMINAL HYSTERECTOMY  1990   PARTIAL  . BREAST LUMPECTOMY  WITH RADIOACTIVE SEED AND SENTINEL LYMPH NODE BIOPSY Right 04/17/2020   Procedure: BREAST LUMPECTOMY X 2  WITH RADIOACTIVE SEED AND SENTINEL LYMPH NODE BIOPSY;  Surgeon: Coralie Keens, MD;  Location: [facility];  Service:  General;  Laterality: Right;  . CATARACT EXTRACTION W/ INTRAOCULAR LENS  IMPLANT, BILATERAL  2016; 2017  . CEREBRAL ANEURYSM REPAIR  1996   clipped-no deficits RIGHT CAROTID  . CHOLECYSTECTOMY  2010  . CYSTOSCOPY W/ RETROGRADES Right 06/09/2017   Procedure: CYSTOSCOPY WITH RETROGRADE PYELOGRAM;  Surgeon: Alexis Frock, MD;  Location: [facility];  Service: Urology;  Laterality: Right;  . CYSTOSCOPY W/ RETROGRADES Right 07/21/2017   Procedure: CYSTOSCOPY WITH RETROGRADE PYELOGRAM;  Surgeon: Alexis Frock, MD;  Location: [facility];  Service: Urology;  Laterality: Right;  . CYSTOSCOPY WITH RETROGRADE PYELOGRAM, URETEROSCOPY AND STENT PLACEMENT Left 08/26/2016   Procedure: CYSTOSCOPY WITH BILATERAL RETROGRADE PYELOGRAM,LEFT URETEROSCOPY WITH BIOPSY AND LEFT STENT PLACEMENT;  Surgeon: Alexis Frock, MD;  Location: [facility];  Service: Urology;  Laterality: Left;  . ROBOT ASSITED LAPAROSCOPIC NEPHROURETERECTOMY Left 10/09/2016   Procedure: XI ROBOT ASSITED LAPAROSCOPIC NEPHROURETERECTOMY;  Surgeon: Alexis Frock, MD;  Location: [facility];  Service: Urology;  Laterality: Left;  . SURGERY ON FOOT FOR STAPH INFECTION  1989  . TRANSURETHRAL RESECTION OF BLADDER TUMOR N/A 07/21/2017   Procedure: TRANSURETHRAL RESECTION OF BLADDER TUMOR (TURBT);  Surgeon: Alexis Frock, MD;  Location[facility];  Service: Urology;  Laterality: N/A;  . TRANSURETHRAL RESECTION OF BLADDER TUMOR WITH MITOMYCIN-C N/A 06/09/2017   Procedure: TRANSURETHRAL RESECTION OF BLADDER TUMOR WITH MITOMYCIN-C;  Surgeon: Alexis Frock, MD;  Location: [facility];  Service: Urology;  Laterality: N/A;  . TUBAL LIGATION  1980    FAMILY HISTORY: Family History  Problem Relation Age of Onset  . Cancer Mother   . Hyperlipidemia Father   . Heart attack Father   . Diabetes Brother   . Breast cancer Neg Hx    Her father died at age 1 from ASCVD. Her mother  died at age 54. She reports her mother had a tumor in her thigh at age 39, possibly a sarcoma. The patient has one brother, with no history of cancer. Her son, Tasha Tucker, had brain cancer (oligodedroglioma, possibly) and is doing well now at age 12 (as of 03/2020).   GYNECOLOGIC HISTORY:  No LMP recorded. Patient has had a hysterectomy. Menarche: 69 years old Age at first live birth: 69 years old [place] P 3 LMP 1990 Contraceptive: used pills for 8 years without issues HRT never used  Hysterectomy? Yes, 1990 BSO? 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 Husband Liliane Channel is a retired Garment/textile technologist for the [employer]. She lives at home with husband Liliane Channel and their cat, [place]. Daughter Bryson Ha, age 36, works as a Careers adviser in [place]. Son Tasha Tucker, age 79, works in Human resources officer in [place]. Son Ruby Cola, age 73, lives in [place] and was previously a paramedic. Zaidy has 4 grandchildren. She attends a [employer].     ADVANCED DIRECTIVES: In the absence of any documentation to the contrary, the patient's spouse is their HCPOA.    HEALTH MAINTENANCE: Social History   Tobacco Use  . Smoking status: Never Smoker  . Smokeless tobacco: Never Used  Vaping Use  . Vaping Use: Never used  Substance Use Topics  . Alcohol use: Not Currently    Comment: , STOPPED  .  Drug use: No     Colonoscopy: Cologuard 2020 was negative  PAP: date unknown, s/p hysterectomy  Bone density: 03/2018, +1.5   Allergies  Allergen Reactions  . Adhesive [Tape] Rash    PAPER TAPE ONLY  . Chlorhexidine Gluconate Itching and Rash    Topical antiseptic (CHG WIPES)   . Dilantin [Phenytoin] Rash  . Penicillins Rash    Has patient had a PCN reaction causing immediate rash, facial/tongue/throat swelling, SOB or lightheadedness with hypotension:unsure Has patient had a PCN reaction causing severe rash involving mucus membranes or skin  necrosis:unsure Has patient had a PCN reaction that required hospitalization:No Has patient had a PCN reaction occurring within the last 10 years:No If all of the above answers are "NO", then may proceed with Cephalosporin use.   . Sulfa Antibiotics Rash    Current Outpatient Medications  Medication Sig Dispense Refill  . acetaminophen (TYLENOL) 500 MG tablet Take 500 mg by mouth at bedtime.     Marland Kitchen anastrozole (ARIMIDEX) 1 MG tablet Take 1 tablet (1 mg total) by mouth daily. 90 tablet 4  . BD PEN NEEDLE NANO U/F 32G X 4 MM MISC USE FOR INSULIN ADMINISTRATION DAILY 100 each 3  . carvedilol (COREG) 6.25 MG tablet Take 1 tablet (6.25 mg total) by mouth 2 (two) times daily. 180 tablet 3  . cetirizine (ZYRTEC) 10 MG tablet Take 10 mg by mouth every morning.     Marland Kitchen HUMALOG KWIKPEN 100 UNIT/ML KwikPen Inject 5-6 Units into the skin 3 (three) times daily.     Marland Kitchen LANTUS SOLOSTAR 100 UNIT/ML Solostar Pen Inject 24- 26 units subcue daily for diabetes (Patient taking differently: Inject 16 Units into the skin every evening. ) 15 mL 6  . lidocaine-prilocaine (EMLA) cream Apply to affected area once 30 g 3  . ONETOUCH DELICA LANCETS 33G MISC Use lancet to stick finger and check blood sugar two times daily. 100 each 6  . ONETOUCH VERIO test strip USE AS DIRECTED. 300 each 2  . pravastatin (PRAVACHOL) 40 MG tablet Take 1 tablet (40 mg total) by mouth daily. 90 tablet 3  . prochlorperazine (COMPAZINE) 10 MG tablet Take 1 tablet (10 mg total) by mouth every 6 (six) hours as needed (Nausea or vomiting). 30 tablet 1   No current facility-administered medications for this visit.    OBJECTIVE: White woman in no acute distress Vitals:   06/19/20 0854  BP: (!) 156/62  Pulse: 65  Resp: 18  Temp: (!) 97 F (36.1 C)  SpO2: 100%     Body mass index is 36.36 kg/m.   Wt Readings from Last 3 Encounters:  06/19/20 239 lb 1.6 oz (108.5 kg)  06/18/20 234 lb (106.1 kg)  06/17/20 234 lb 1.6 oz (106.2 kg)       ECOG FS:1 - Symptomatic but completely ambulatory  Sclerae unicteric, EOMs intact Wearing a mask No cervical or supraclavicular adenopathy Lungs no rales or rhonchi Heart regular rate and rhythm Abd soft, nontender, positive bowel sounds MSK no focal spinal tenderness, no upper extremity lymphedema Neuro: nonfocal, well oriented, appropriate affect Breasts: The right breast is status post lumpectomy.  The incision is healed nicely.  There are 2 small eschars on the incision.  There is no evidence of inflammation or infection.  The left breast is benign.  Both axillae are benign. Skin: Port site intact accessed.  LAB RESULTS:  CMP     Component Value Date/Time   NA 137 06/19/2020 0842   NA  140 04/21/2019 0000   K 4.4 06/19/2020 0842   CL 103 06/19/2020 0842   CO2 29 06/19/2020 0842   GLUCOSE 232 (H) 06/19/2020 0842   BUN 28 (H) 06/19/2020 0842   BUN 36 (A) 08/30/2017 0000   CREATININE 1.78 (H) 06/19/2020 0842   CALCIUM 9.4 06/19/2020 0842   PROT 6.8 06/19/2020 0842   ALBUMIN 3.1 (L) 06/19/2020 0842   AST 24 06/19/2020 0842   ALT 30 06/19/2020 0842   ALKPHOS 74 06/19/2020 0842   BILITOT 0.5 06/19/2020 0842   GFRNONAA 28 (L) 06/19/2020 0842   GFRAA 33 (L) 06/19/2020 0842    No results found for: TOTALPROTELP, ALBUMINELP, A1GS, A2GS, BETS, BETA2SER, GAMS, MSPIKE, SPEI  Lab Results  Component Value Date   WBC 7.4 06/19/2020   NEUTROABS 5.0 06/19/2020   HGB 13.6 06/19/2020   HCT 40.7 06/19/2020   MCV 90.4 06/19/2020   PLT 141 (L) 06/19/2020    No results found for: LABCA2  No components found for: KDTOIZ124  No results for input(s): INR in the last 168 hours.  No results found for: LABCA2  No results found for: PYK998  No results found for: PJA250  No results found for: NLZ767  No results found for: CA2729  No components found for: HGQUANT  No results found for: CEA1 / No results found for: CEA1   No results found for: AFPTUMOR  No results found for:  CHROMOGRNA  No results found for: KPAFRELGTCHN, LAMBDASER, KAPLAMBRATIO (kappa/lambda light chains)  No results found for: HGBA, HGBA2QUANT, HGBFQUANT, HGBSQUAN (Hemoglobinopathy evaluation)   No results found for: LDH  No results found for: IRON, TIBC, IRONPCTSAT (Iron and TIBC)  No results found for: FERRITIN  Urinalysis    Component Value Date/Time   COLORURINE RED (A) 08/16/2016 0450   APPEARANCEUR CLOUDY (A) 08/16/2016 0450   LABSPEC 1.014 08/16/2016 0450   PHURINE 6.0 08/16/2016 0450   GLUCOSEU NEGATIVE 08/16/2016 0450   HGBUR LARGE (A) 08/16/2016 0450   BILIRUBINUR negative 06/27/2018 0934   KETONESUR negative 06/27/2018 [place] 08/16/2016 0450   PROTEINUR negative 06/27/2018 0934   PROTEINUR 100 (A) 08/16/2016 0450   UROBILINOGEN negative (A) 06/27/2018 0934   UROBILINOGEN 1.0 04/29/2013 1455   NITRITE Negative 06/27/2018 0934   NITRITE NEGATIVE 08/16/2016 0450   LEUKOCYTESUR Small (1+) (A) 06/27/2018 0934     STUDIES: DG CHEST PORT 1 VIEW  Result Date: 06/18/2020 CLINICAL DATA:  Port-A-Cath placement EXAM: PORTABLE CHEST 1 VIEW COMPARISON:  March 26, 2020 FINDINGS: Port-A-Cath tip is in the superior vena cava near the cavoatrial junction. No pneumothorax. No edema or airspace opacity. Heart is upper normal in size with pulmonary vascularity normal. No adenopathy. No bone lesions. IMPRESSION: Port-A-Cath tip in superior vena cava near cavoatrial junction. No pneumothorax. No edema or airspace opacity. Heart upper normal in size. Electronically Signed   By: Lowella Grip III M.D.   On: 06/18/2020 08:33   DG Fluoro Guide CV Line-No Report  Result Date: 06/18/2020 Fluoroscopy was utilized by the requesting physician.  No radiographic interpretation.     ELIGIBLE FOR AVAILABLE RESEARCH PROTOCOL: AET  ASSESSMENT: 69 y.o. [place] woman status post right breast upper outer quadrant biopsy 03/26/2020, for a clinical T1c N0,  stage IA invasive lobular carcinoma, E-cadherin negative, grade 2, estrogen and progesterone receptor positive, with an MIB-1 of 5% and no HER-2 amplification  (a) unable to obtain breast MRI due to renal concern with contrast status post  nephrectomy  (1) status post right lumpectomy and sentinel lymph node sampling 04/17/2020 for a pT2 pN0, stage IB invasive lobular carcinoma, grade 2, with negative margins  (a) a single right axillary lymph node was removed.  (2) Oncotype score of 29 predicts a risk of recurrence outside the breast within the next 9 years of 18% with antiestrogens only, and also a greater than 15% benefit from chemotherapy.  (3) chemotherapy will consist of cyclophosphamide, methotrexate and fluorouracil given every 21 days x 8, starting 06/19/2020  (4) adjuvant radiation can be given concurrently with chemotherapy  (5) anastrozole started 04/03/2020 with anticipation of surgical delays, to be interrupted during chemotherapy   PLAN: Damonie is ready to start her adjuvant chemotherapy today.  She has a good understanding of the possible toxicity side effects and complications and has met with our chemotherapy teaching nurse.  I have taken all steroids out of her treatment plan and substituted different drugs for relief premeds given her poorly controlled diabetes.  I have asked her to take Compazine 10 mg before meals and at bedtime tomorrow and then if she has absolutely no nausea she can go down to 5 mg before meals and at bedtime the day after that.  Beginning with day 4 she may take Compazine as needed.  I have asked her to keep a symptom diary so that we can discuss when she returns to see me in a week and troubleshoot problems at that time.  In particular she understands she may be constipated and may have mild headaches from the premeds  Total encounter time 30 minutes.   Virgie Dad. Tilda Samudio, MD 06/19/2020 9:24 AM Medical Oncology and Hematology [facility] [place], [place] [address] Tel. [phone]    Fax. [phone]   This document serves as a record of services personally performed by Lurline Del, MD. It was created on his behalf by Wilburn Mylar, a trained medical scribe. The creation of this record is based on the scribe's personal observations and the provider's statements to them.   I, Lurline Del MD, have reviewed the above documentation for accuracy and completeness, and I agree with the above.   *Total Encounter Time as defined by the Centers for Medicare and Medicaid Services includes, in addition to the face-to-face time of a patient visit (documented in the note above) non-face-to-face time: obtaining and reviewing outside history, ordering and reviewing medications, tests or procedures, care coordination (communications with other health care professionals or caregivers) and documentation in the medical record.

## 2020-06-18 NOTE — Op Note (Signed)
° °  CHANNELL QUATTRONE 06/18/2020   Pre-op Diagnosis: HISTORY OF BREAST CANCER     Post-op Diagnosis: same  Procedure(s): LEFT SUBCLAVIAN PORT A CATH INSERTION (8 fr)  Surgeon(s): Coralie Keens, MD  Anesthesia: General  Staff:  Circulator: Rometta Emery, RN Radiology Technologist: Vivi Barrack, RT Scrub Person: Rolan Bucco Circulator Assistant: Allene Dillon, RN; Celene Squibb, RN Float Surgical Tech: Dollene Cleveland T  Estimated Blood Loss: Minimal               Indications: This is a 69 year old female status post recent lumpectomy for breast cancer.  She now presents for Port-A-Cath insertion for adjuvant chemotherapy  Procedure: The patient was brought to the operating room identifies correct patient.  She is placed upon the operating table general anesthesia was induced.  Her left chest and neck were prepped and draped in usual sterile fashion.  The patient was placed in the Trendelenburg position.  I anesthetized the skin of the left chest and clavicle with Marcaine.  I then used the introducer needle and syringe to easily cannulate the left subclavian vein.  I passed a wire through the needle into the central venous system.  This was confirmed on fluoroscopy.  I anesthetized the skin around the wire site further with Marcaine.  I then made a longitudinal incision with a scalpel incorporating the wire site.  I then dissected down to the chest with electrocautery and created a pocket for the port.  Next, an 8 [place] port was brought to the field.  It was flushed appropriately.  I passed the introducer sheath and dilator over the wire and into the central venous system.  I then removed the wire and dilator.  I attached the catheter to the port.  I cut an appropriate length.  I then placed the port into the chest pocket and then fed the catheter down the peel-away sheath.  The sheath was then completely peeled away leaving the catheter in central venous  system.  It was confirmed to be in the distal central venous system under direct fluoroscopy.  I accessed the port and good flush and return were demonstrated.  I then instilled with concentrated heparin solution.  I sutured the port to the chest wall with 2 separate 3-0 Prolene sutures.  I then closed subtenons tissue with interrupted 3-0 Vicryl sutures and closed the skin with running 4-0 Monocryl.  Dermabond was then applied.  The patient tolerated the procedure well.  All the counts were correct at the end of the procedure.  The patient was then extubated in the operating room and taken in stable addition to the recovery room.          Coralie Keens   Date: 06/18/2020  Time: 8:01 AM

## 2020-06-19 ENCOUNTER — Inpatient Hospital Stay: Payer: Medicare Other

## 2020-06-19 ENCOUNTER — Encounter (HOSPITAL_COMMUNITY): Payer: Self-pay | Admitting: Surgery

## 2020-06-19 ENCOUNTER — Encounter: Payer: Self-pay | Admitting: *Deleted

## 2020-06-19 ENCOUNTER — Inpatient Hospital Stay: Payer: Medicare Other | Attending: Oncology | Admitting: Oncology

## 2020-06-19 ENCOUNTER — Other Ambulatory Visit: Payer: Self-pay

## 2020-06-19 VITALS — BP 156/62 | HR 65 | Temp 97.0°F | Resp 18 | Ht 68.0 in | Wt 239.1 lb

## 2020-06-19 DIAGNOSIS — Z170 Estrogen receptor positive status [ER+]: Secondary | ICD-10-CM | POA: Insufficient documentation

## 2020-06-19 DIAGNOSIS — Z8553 Personal history of malignant neoplasm of renal pelvis: Secondary | ICD-10-CM | POA: Insufficient documentation

## 2020-06-19 DIAGNOSIS — Z8249 Family history of ischemic heart disease and other diseases of the circulatory system: Secondary | ICD-10-CM | POA: Diagnosis not present

## 2020-06-19 DIAGNOSIS — Z79811 Long term (current) use of aromatase inhibitors: Secondary | ICD-10-CM | POA: Diagnosis not present

## 2020-06-19 DIAGNOSIS — Z809 Family history of malignant neoplasm, unspecified: Secondary | ICD-10-CM | POA: Insufficient documentation

## 2020-06-19 DIAGNOSIS — Z95828 Presence of other vascular implants and grafts: Secondary | ICD-10-CM

## 2020-06-19 DIAGNOSIS — E785 Hyperlipidemia, unspecified: Secondary | ICD-10-CM | POA: Insufficient documentation

## 2020-06-19 DIAGNOSIS — I10 Essential (primary) hypertension: Secondary | ICD-10-CM | POA: Diagnosis not present

## 2020-06-19 DIAGNOSIS — C652 Malignant neoplasm of left renal pelvis: Secondary | ICD-10-CM | POA: Diagnosis not present

## 2020-06-19 DIAGNOSIS — Z905 Acquired absence of kidney: Secondary | ICD-10-CM | POA: Diagnosis not present

## 2020-06-19 DIAGNOSIS — E1165 Type 2 diabetes mellitus with hyperglycemia: Secondary | ICD-10-CM | POA: Diagnosis not present

## 2020-06-19 DIAGNOSIS — Z5111 Encounter for antineoplastic chemotherapy: Secondary | ICD-10-CM | POA: Diagnosis not present

## 2020-06-19 DIAGNOSIS — Z794 Long term (current) use of insulin: Secondary | ICD-10-CM

## 2020-06-19 DIAGNOSIS — N184 Chronic kidney disease, stage 4 (severe): Secondary | ICD-10-CM

## 2020-06-19 DIAGNOSIS — C50411 Malignant neoplasm of upper-outer quadrant of right female breast: Secondary | ICD-10-CM | POA: Diagnosis not present

## 2020-06-19 DIAGNOSIS — Z833 Family history of diabetes mellitus: Secondary | ICD-10-CM | POA: Diagnosis not present

## 2020-06-19 DIAGNOSIS — Z79899 Other long term (current) drug therapy: Secondary | ICD-10-CM | POA: Diagnosis not present

## 2020-06-19 LAB — CBC WITH DIFFERENTIAL (CANCER CENTER ONLY)
Abs Immature Granulocytes: 0.02 10*3/uL (ref 0.00–0.07)
Basophils Absolute: 0.1 10*3/uL (ref 0.0–0.1)
Basophils Relative: 2 %
Eosinophils Absolute: 0.5 10*3/uL (ref 0.0–0.5)
Eosinophils Relative: 7 %
HCT: 40.7 % (ref 36.0–46.0)
Hemoglobin: 13.6 g/dL (ref 12.0–15.0)
Immature Granulocytes: 0 %
Lymphocytes Relative: 18 %
Lymphs Abs: 1.3 10*3/uL (ref 0.7–4.0)
MCH: 30.2 pg (ref 26.0–34.0)
MCHC: 33.4 g/dL (ref 30.0–36.0)
MCV: 90.4 fL (ref 80.0–100.0)
Monocytes Absolute: 0.6 10*3/uL (ref 0.1–1.0)
Monocytes Relative: 7 %
Neutro Abs: 5 10*3/uL (ref 1.7–7.7)
Neutrophils Relative %: 66 %
Platelet Count: 141 10*3/uL — ABNORMAL LOW (ref 150–400)
RBC: 4.5 MIL/uL (ref 3.87–5.11)
RDW: 12.2 % (ref 11.5–15.5)
WBC Count: 7.4 10*3/uL (ref 4.0–10.5)
nRBC: 0 % (ref 0.0–0.2)

## 2020-06-19 LAB — CMP (CANCER CENTER ONLY)
ALT: 30 U/L (ref 0–44)
AST: 24 U/L (ref 15–41)
Albumin: 3.1 g/dL — ABNORMAL LOW (ref 3.5–5.0)
Alkaline Phosphatase: 74 U/L (ref 38–126)
Anion gap: 5 (ref 5–15)
BUN: 28 mg/dL — ABNORMAL HIGH (ref 8–23)
CO2: 29 mmol/L (ref 22–32)
Calcium: 9.4 mg/dL (ref 8.9–10.3)
Chloride: 103 mmol/L (ref 98–111)
Creatinine: 1.78 mg/dL — ABNORMAL HIGH (ref 0.44–1.00)
GFR, Est AFR Am: 33 mL/min — ABNORMAL LOW (ref >60)
GFR, Estimated: 28 mL/min — ABNORMAL LOW (ref >60)
Glucose, Bld: 232 mg/dL — ABNORMAL HIGH (ref 70–99)
Potassium: 4.4 mmol/L (ref 3.5–5.1)
Sodium: 137 mmol/L (ref 135–145)
Total Bilirubin: 0.5 mg/dL (ref 0.3–1.2)
Total Protein: 6.8 g/dL (ref 6.5–8.1)

## 2020-06-19 MED ORDER — SODIUM CHLORIDE 0.9 % IV SOLN
500.0000 mg/m2 | Freq: Once | INTRAVENOUS | Status: AC
  Administered 2020-06-19: 1120 mg via INTRAVENOUS
  Filled 2020-06-19: qty 56

## 2020-06-19 MED ORDER — SODIUM CHLORIDE 0.9 % IV SOLN
150.0000 mg | Freq: Once | INTRAVENOUS | Status: AC
  Administered 2020-06-19: 150 mg via INTRAVENOUS
  Filled 2020-06-19: qty 150

## 2020-06-19 MED ORDER — PALONOSETRON HCL INJECTION 0.25 MG/5ML
INTRAVENOUS | Status: AC
  Filled 2020-06-19: qty 5

## 2020-06-19 MED ORDER — METHOTREXATE SODIUM (PF) CHEMO INJECTION 250 MG/10ML
30.0000 mg/m2 | Freq: Once | INTRAMUSCULAR | Status: AC
  Administered 2020-06-19: 67 mg via INTRAVENOUS
  Filled 2020-06-19: qty 2.68

## 2020-06-19 MED ORDER — PROCHLORPERAZINE EDISYLATE 10 MG/2ML IJ SOLN
INTRAMUSCULAR | Status: AC
  Filled 2020-06-19: qty 2

## 2020-06-19 MED ORDER — PROCHLORPERAZINE EDISYLATE 10 MG/2ML IJ SOLN
10.0000 mg | Freq: Once | INTRAMUSCULAR | Status: AC
  Administered 2020-06-19: 10 mg via INTRAVENOUS

## 2020-06-19 MED ORDER — FLUOROURACIL CHEMO INJECTION 2.5 GM/50ML
600.0000 mg/m2 | Freq: Once | INTRAVENOUS | Status: AC
  Administered 2020-06-19: 1350 mg via INTRAVENOUS
  Filled 2020-06-19: qty 27

## 2020-06-19 MED ORDER — SODIUM CHLORIDE 0.9% FLUSH
10.0000 mL | INTRAVENOUS | Status: DC | PRN
  Administered 2020-06-19: 10 mL via INTRAVENOUS
  Filled 2020-06-19: qty 10

## 2020-06-19 MED ORDER — PALONOSETRON HCL INJECTION 0.25 MG/5ML
0.2500 mg | Freq: Once | INTRAVENOUS | Status: AC
  Administered 2020-06-19: 0.25 mg via INTRAVENOUS

## 2020-06-19 MED ORDER — HEPARIN SOD (PORK) LOCK FLUSH 100 UNIT/ML IV SOLN
500.0000 [IU] | Freq: Once | INTRAVENOUS | Status: AC | PRN
  Administered 2020-06-19: 500 [IU]
  Filled 2020-06-19: qty 5

## 2020-06-19 MED ORDER — SODIUM CHLORIDE 0.9% FLUSH
10.0000 mL | INTRAVENOUS | Status: DC | PRN
  Administered 2020-06-19: 10 mL
  Filled 2020-06-19: qty 10

## 2020-06-19 MED ORDER — SODIUM CHLORIDE 0.9 % IV SOLN
Freq: Once | INTRAVENOUS | Status: AC
  Filled 2020-06-19: qty 250

## 2020-06-19 MED ORDER — PROCHLORPERAZINE MALEATE 10 MG PO TABS
ORAL_TABLET | ORAL | Status: AC
  Filled 2020-06-19: qty 1

## 2020-06-19 NOTE — Patient Instructions (Signed)

## 2020-06-19 NOTE — Patient Instructions (Signed)
[facility] Discharge Instructions for Patients Receiving Chemotherapy  Today you received the following chemotherapy agents: Cytoxan, Methotrexate, and Fluorouracil  To help prevent nausea and vomiting after your treatment, we encourage you to take your nausea medication as prescribed.    If you develop nausea and vomiting that is not controlled by your nausea medication, call the clinic.   BELOW ARE SYMPTOMS THAT SHOULD BE REPORTED IMMEDIATELY:  *FEVER GREATER THAN 100.5 F  *CHILLS WITH OR WITHOUT FEVER  NAUSEA AND VOMITING THAT IS NOT CONTROLLED WITH YOUR NAUSEA MEDICATION  *UNUSUAL SHORTNESS OF BREATH  *UNUSUAL BRUISING OR BLEEDING  TENDERNESS IN MOUTH AND THROAT WITH OR WITHOUT PRESENCE OF ULCERS  *URINARY PROBLEMS  *BOWEL PROBLEMS  UNUSUAL RASH Items with * indicate a potential emergency and should be followed up as soon as possible.  Feel free to call the clinic should you have any questions or concerns. The clinic phone number is [phone].  Please show the [facility] at check-in to the Emergency Department and triage nurse.  Cyclophosphamide Injection What is this medicine? CYCLOPHOSPHAMIDE (sye kloe FOSS fa mide) is a chemotherapy drug. It slows the growth of cancer cells. This medicine is used to treat many types of cancer like lymphoma, myeloma, leukemia, breast cancer, and ovarian cancer, to name a few. This medicine may be used for other purposes; ask your health care provider or pharmacist if you have questions. COMMON BRAND NAME(S): Cytoxan, Neosar What should I tell my health care provider before I take this medicine? They need to know if you have any of these conditions:  heart disease  history of irregular heartbeat  infection  kidney disease  liver disease  low blood counts, like white cells, platelets, or red blood cells  on hemodialysis  recent or ongoing radiation therapy  scarring or thickening of the  lungs  trouble passing urine  an unusual or allergic reaction to cyclophosphamide, other medicines, foods, dyes, or preservatives  pregnant or trying to get pregnant  breast-feeding How should I use this medicine? This drug is usually given as an injection into a vein or muscle or by infusion into a vein. It is administered in a hospital or clinic by a specially trained health care professional. Talk to your pediatrician regarding the use of this medicine in children. Special care may be needed. Overdosage: If you think you have taken too much of this medicine contact a poison control center or emergency room at once. NOTE: This medicine is only for you. Do not share this medicine with others. What if I miss a dose? It is important not to miss your dose. Call your doctor or health care professional if you are unable to keep an appointment. What may interact with this medicine?  amphotericin B  azathioprine  certain antivirals for HIV or hepatitis  certain medicines for blood pressure, heart disease, irregular heart beat  certain medicines that treat or prevent blood clots like warfarin  certain other medicines for cancer  cyclosporine  etanercept  indomethacin  medicines that relax muscles for surgery  medicines to increase blood counts  metronidazole This list may not describe all possible interactions. Give your health care provider a list of all the medicines, herbs, non-prescription drugs, or dietary supplements you use. Also tell them if you smoke, drink alcohol, or use illegal drugs. Some items may interact with your medicine. What should I watch for while using this medicine? Your condition will be monitored carefully while you are  receiving this medicine. You may need blood work done while you are taking this medicine. Drink water or other fluids as directed. Urinate often, even at night. Some products may contain alcohol. Ask your health care professional if  this medicine contains alcohol. Be sure to tell all health care professionals you are taking this medicine. Certain medicines, like metronidazole and disulfiram, can cause an unpleasant reaction when taken with alcohol. The reaction includes flushing, headache, nausea, vomiting, sweating, and increased thirst. The reaction can last from 30 minutes to several hours. Do not become pregnant while taking this medicine or for 1 year after stopping it. Women should inform their health care professional if they wish to become pregnant or think they might be pregnant. Men should not father a child while taking this medicine and for 4 months after stopping it. There is potential for serious side effects to an unborn child. Talk to your health care professional for more information. Do not breast-feed an infant while taking this medicine or for 1 week after stopping it. This medicine has caused ovarian failure in some women. This medicine may make it more difficult to get pregnant. Talk to your health care professional if you are concerned about your fertility. This medicine has caused decreased sperm counts in some men. This may make it more difficult to father a child. Talk to your health care professional if you are concerned about your fertility. Call your health care professional for advice if you get a fever, chills, or sore throat, or other symptoms of a cold or flu. Do not treat yourself. This medicine decreases your body's ability to fight infections. Try to avoid being around people who are sick. Avoid taking medicines that contain aspirin, acetaminophen, ibuprofen, naproxen, or ketoprofen unless instructed by your health care professional. These medicines may hide a fever. Talk to your health care professional about your risk of cancer. You may be more at risk for certain types of cancer if you take this medicine. If you are going to need surgery or other procedure, tell your health care professional that  you are using this medicine. Be careful brushing or flossing your teeth or using a toothpick because you may get an infection or bleed more easily. If you have any dental work done, tell your dentist you are receiving this medicine. What side effects may I notice from receiving this medicine? Side effects that you should report to your doctor or health care professional as soon as possible:  allergic reactions like skin rash, itching or hives, swelling of the face, lips, or tongue  breathing problems  nausea, vomiting  signs and symptoms of bleeding such as bloody or black, tarry stools; red or dark brown urine; spitting up blood or brown material that looks like coffee grounds; red spots on the skin; unusual bruising or bleeding from the eyes, gums, or nose  signs and symptoms of heart failure like fast, irregular heartbeat, sudden weight gain; swelling of the ankles, feet, hands  signs and symptoms of infection like fever; chills; cough; sore throat; pain or trouble passing urine  signs and symptoms of kidney injury like trouble passing urine or change in the amount of urine  signs and symptoms of liver injury like dark yellow or brown urine; general ill feeling or flu-like symptoms; light-colored stools; loss of appetite; nausea; right upper belly pain; unusually weak or tired; yellowing of the eyes or skin Side effects that usually do not require medical attention (report to your doctor or  health care professional if they continue or are bothersome):  confusion  decreased hearing  diarrhea  facial flushing  hair loss  headache  loss of appetite  missed menstrual periods  signs and symptoms of low red blood cells or anemia such as unusually weak or tired; feeling faint or lightheaded; falls  skin discoloration This list may not describe all possible side effects. Call your doctor for medical advice about side effects. You may report side effects to FDA at  1-800-FDA-1088. Where should I keep my medicine? This drug is given in a hospital or clinic and will not be stored at home. NOTE: This sheet is a summary. It may not cover all possible information. If you have questions about this medicine, talk to your doctor, pharmacist, or health care provider.  2020 Elsevier/Gold Standard (2019-06-19 09:53:29)  Methotrexate injection What is this medicine? METHOTREXATE (METH oh TREX ate) is a chemotherapy drug used to treat cancer including breast cancer, leukemia, and lymphoma. This medicine can also be used to treat psoriasis and certain kinds of arthritis. This medicine may be used for other purposes; ask your health care provider or pharmacist if you have questions. What should I tell my health care provider before I take this medicine? They need to know if you have any of these conditions:  fluid in the stomach area or lungs  if you often drink alcohol  infection or immune system problems  kidney disease  liver disease  low blood counts, like low white cell, platelet, or red cell counts  lung disease  radiation therapy  stomach ulcers  ulcerative colitis  an unusual or allergic reaction to methotrexate, other medicines, foods, dyes, or preservatives  pregnant or trying to get pregnant  breast-feeding How should I use this medicine? This medicine is for infusion into a vein or for injection into muscle or into the spinal fluid (whichever applies). It is usually given by a health care professional in a hospital or clinic setting. In rare cases, you might get this medicine at home. You will be taught how to give this medicine. Use exactly as directed. Take your medicine at regular intervals. Do not take your medicine more often than directed. If this medicine is used for arthritis or psoriasis, it should be taken weekly, NOT daily. It is important that you put your used needles and syringes in a special sharps container. Do not put  them in a trash can. If you do not have a sharps container, call your pharmacist or healthcare provider to get one. Talk to your pediatrician regarding the use of this medicine in children. While this drug may be prescribed for children as young as 2 years for selected conditions, precautions do apply. Overdosage: If you think you have taken too much of this medicine contact a poison control center or emergency room at once. NOTE: This medicine is only for you. Do not share this medicine with others. What if I miss a dose? It is important not to miss your dose. Call your doctor or health care professional if you are unable to keep an appointment. If you give yourself the medicine and you miss a dose, talk with your doctor or health care professional. Do not take double or extra doses. What may interact with this medicine? This medicine may interact with the following medications:  acitretin  aspirin or aspirin-like medicines including salicylates  azathioprine  certain antibiotics like chloramphenicol, penicillin, tetracycline  certain medicines for stomach problems like esomeprazole, omeprazole, pantoprazole  cyclosporine  gold  hydroxychloroquine  live virus vaccines  mercaptopurine  NSAIDs, medicines for pain and inflammation, like ibuprofen or naproxen  other cytotoxic agents  penicillamine  phenylbutazone  phenytoin  probenacid  retinoids such as isotretinoin and tretinoin  steroid medicines like prednisone or cortisone  sulfonamides like sulfasalazine and trimethoprim/sulfamethoxazole  theophylline This list may not describe all possible interactions. Give your health care provider a list of all the medicines, herbs, non-prescription drugs, or dietary supplements you use. Also tell them if you smoke, drink alcohol, or use illegal drugs. Some items may interact with your medicine. What should I watch for while using this medicine? Avoid alcoholic drinks. In  some cases, you may be given additional medicines to help with side effects. Follow all directions for their use. This medicine can make you more sensitive to the sun. Keep out of the sun. If you cannot avoid being in the sun, wear protective clothing and use sunscreen. Do not use sun lamps or tanning beds/booths. You may get drowsy or dizzy. Do not drive, use machinery, or do anything that needs mental alertness until you know how this medicine affects you. Do not stand or sit up quickly, especially if you are an older patient. This reduces the risk of dizzy or fainting spells. You may need blood work done while you are taking this medicine. Call your doctor or health care professional for advice if you get a fever, chills or sore throat, or other symptoms of a cold or flu. Do not treat yourself. This drug decreases your body's ability to fight infections. Try to avoid being around people who are sick. This medicine may increase your risk to bruise or bleed. Call your doctor or health care professional if you notice any unusual bleeding. Check with your doctor or health care professional if you get an attack of severe diarrhea, nausea and vomiting, or if you sweat a lot. The loss of too much body fluid can make it dangerous for you to take this medicine. Talk to your doctor about your risk of cancer. You may be more at risk for certain types of cancers if you take this medicine. Both men and women must use effective birth control with this medicine. Do not become pregnant while taking this medicine or until at least 1 normal menstrual cycle has occurred after stopping it. Women should inform their doctor if they wish to become pregnant or think they might be pregnant. Men should not father a child while taking this medicine and for 3 months after stopping it. There is a potential for serious side effects to an unborn child. Talk to your health care professional or pharmacist for more information. Do not  breast-feed an infant while taking this medicine. What side effects may I notice from receiving this medicine? Side effects that you should report to your doctor or health care professional as soon as possible:  allergic reactions like skin rash, itching or hives, swelling of the face, lips, or tongue  back pain  breathing problems or shortness of breath  confusion  diarrhea  dry, nonproductive cough  low blood counts - this medicine may decrease the number of white blood cells, red blood cells and platelets. You may be at increased risk of infections and bleeding  mouth sores  redness, blistering, peeling or loosening of the skin, including inside the mouth  seizures  severe headaches  signs of infection - fever or chills, cough, sore throat, pain or difficulty passing urine  signs and symptoms of bleeding such as bloody or black, tarry stools; red or dark-brown urine; spitting up blood or brown material that looks like coffee grounds; red spots on the skin; unusual bruising or bleeding from the eye, gums, or nose  signs and symptoms of kidney injury like trouble passing urine or change in the amount of urine  signs and symptoms of liver injury like dark yellow or brown urine; general ill feeling or flu-like symptoms; light-colored stools; loss of appetite; nausea; right upper belly pain; unusually weak or tired; yellowing of the eyes or skin  stiff neck  vomiting Side effects that usually do not require medical attention (report to your doctor or health care professional if they continue or are bothersome):  dizziness  hair loss  headache  stomach pain  upset stomach This list may not describe all possible side effects. Call your doctor for medical advice about side effects. You may report side effects to FDA at 1-800-FDA-1088. Where should I keep my medicine? If you are using this medicine at home, you will be instructed on how to store this medicine. Throw away  any unused medicine after the expiration date on the label. NOTE: This sheet is a summary. It may not cover all possible information. If you have questions about this medicine, talk to your doctor, pharmacist, or health care provider.  2020 Elsevier/Gold Standard (2017-05-06 13:31:42)  Fluorouracil, 5-FU injection What is this medicine? FLUOROURACIL, 5-FU (flure oh YOOR a sil) is a chemotherapy drug. It slows the growth of cancer cells. This medicine is used to treat many types of cancer like breast cancer, colon or rectal cancer, pancreatic cancer, and stomach cancer. This medicine may be used for other purposes; ask your health care provider or pharmacist if you have questions. COMMON BRAND NAME(S): Adrucil What should I tell my health care provider before I take this medicine? They need to know if you have any of these conditions:  blood disorders  dihydropyrimidine dehydrogenase (DPD) deficiency  infection (especially a virus infection such as chickenpox, cold sores, or herpes)  kidney disease  liver disease  malnourished, poor nutrition  recent or ongoing radiation therapy  an unusual or allergic reaction to fluorouracil, other chemotherapy, other medicines, foods, dyes, or preservatives  pregnant or trying to get pregnant  breast-feeding How should I use this medicine? This drug is given as an infusion or injection into a vein. It is administered in a hospital or clinic by a specially trained health care professional. Talk to your pediatrician regarding the use of this medicine in children. Special care may be needed. Overdosage: If you think you have taken too much of this medicine contact a poison control center or emergency room at once. NOTE: This medicine is only for you. Do not share this medicine with others. What if I miss a dose? It is important not to miss your dose. Call your doctor or health care professional if you are unable to keep an appointment. What may  interact with this medicine?  allopurinol  cimetidine  dapsone  digoxin  hydroxyurea  leucovorin  levamisole  medicines for seizures like ethotoin, fosphenytoin, phenytoin  medicines to increase blood counts like filgrastim, pegfilgrastim, sargramostim  medicines that treat or prevent blood clots like warfarin, enoxaparin, and dalteparin  methotrexate  metronidazole  pyrimethamine  some other chemotherapy drugs like busulfan, cisplatin, estramustine, vinblastine  trimethoprim  trimetrexate  vaccines Talk to your doctor or health care professional before taking any of these medicines:  acetaminophen  aspirin  ibuprofen  ketoprofen  naproxen This list may not describe all possible interactions. Give your health care provider a list of all the medicines, herbs, non-prescription drugs, or dietary supplements you use. Also tell them if you smoke, drink alcohol, or use illegal drugs. Some items may interact with your medicine. What should I watch for while using this medicine? Visit your doctor for checks on your progress. This drug may make you feel generally unwell. This is not uncommon, as chemotherapy can affect healthy cells as well as cancer cells. Report any side effects. Continue your course of treatment even though you feel ill unless your doctor tells you to stop. In some cases, you may be given additional medicines to help with side effects. Follow all directions for their use. Call your doctor or health care professional for advice if you get a fever, chills or sore throat, or other symptoms of a cold or flu. Do not treat yourself. This drug decreases your body's ability to fight infections. Try to avoid being around people who are sick. This medicine may increase your risk to bruise or bleed. Call your doctor or health care professional if you notice any unusual bleeding. Be careful brushing and flossing your teeth or using a toothpick because you may get an  infection or bleed more easily. If you have any dental work done, tell your dentist you are receiving this medicine. Avoid taking products that contain aspirin, acetaminophen, ibuprofen, naproxen, or ketoprofen unless instructed by your doctor. These medicines may hide a fever. Do not become pregnant while taking this medicine. Women should inform their doctor if they wish to become pregnant or think they might be pregnant. There is a potential for serious side effects to an unborn child. Talk to your health care professional or pharmacist for more information. Do not breast-feed an infant while taking this medicine. Men should inform their doctor if they wish to father a child. This medicine may lower sperm counts. Do not treat diarrhea with over the counter products. Contact your doctor if you have diarrhea that lasts more than 2 days or if it is severe and watery. This medicine can make you more sensitive to the sun. Keep out of the sun. If you cannot avoid being in the sun, wear protective clothing and use sunscreen. Do not use sun lamps or tanning beds/booths. What side effects may I notice from receiving this medicine? Side effects that you should report to your doctor or health care professional as soon as possible:  allergic reactions like skin rash, itching or hives, swelling of the face, lips, or tongue  low blood counts - this medicine may decrease the number of white blood cells, red blood cells and platelets. You may be at increased risk for infections and bleeding.  signs of infection - fever or chills, cough, sore throat, pain or difficulty passing urine  signs of decreased platelets or bleeding - bruising, pinpoint red spots on the skin, black, tarry stools, blood in the urine  signs of decreased red blood cells - unusually weak or tired, fainting spells, lightheadedness  breathing problems  changes in vision  chest pain  mouth sores  nausea and vomiting  pain, swelling,  redness at site where injected  pain, tingling, numbness in the hands or feet  redness, swelling, or sores on hands or feet  stomach pain  unusual bleeding Side effects that usually do not require medical attention (report to your doctor or health care professional  if they continue or are bothersome):  changes in finger or toe nails  diarrhea  dry or itchy skin  hair loss  headache  loss of appetite  sensitivity of eyes to the light  stomach upset  unusually teary eyes This list may not describe all possible side effects. Call your doctor for medical advice about side effects. You may report side effects to FDA at 1-800-FDA-1088. Where should I keep my medicine? This drug is given in a hospital or clinic and will not be stored at home. NOTE: This sheet is a summary. It may not cover all possible information. If you have questions about this medicine, talk to your doctor, pharmacist, or health care provider.  2020 Elsevier/Gold Standard (2008-01-18 13:53:16)

## 2020-06-20 ENCOUNTER — Telehealth: Payer: Self-pay | Admitting: Oncology

## 2020-06-20 NOTE — Telephone Encounter (Signed)
No 9/22 los. No changes made to pt's schedule.

## 2020-06-26 ENCOUNTER — Inpatient Hospital Stay: Payer: Medicare Other

## 2020-06-26 ENCOUNTER — Other Ambulatory Visit: Payer: Self-pay

## 2020-06-26 ENCOUNTER — Encounter: Payer: Self-pay | Admitting: Oncology

## 2020-06-26 ENCOUNTER — Inpatient Hospital Stay (HOSPITAL_BASED_OUTPATIENT_CLINIC_OR_DEPARTMENT_OTHER): Payer: Medicare Other | Admitting: Oncology

## 2020-06-26 ENCOUNTER — Inpatient Hospital Stay: Payer: Medicare Other | Admitting: Oncology

## 2020-06-26 ENCOUNTER — Encounter: Payer: Self-pay | Admitting: *Deleted

## 2020-06-26 VITALS — BP 130/84 | HR 79 | Temp 97.5°F | Resp 18 | Ht 68.0 in | Wt 228.4 lb

## 2020-06-26 DIAGNOSIS — C652 Malignant neoplasm of left renal pelvis: Secondary | ICD-10-CM

## 2020-06-26 DIAGNOSIS — Z95828 Presence of other vascular implants and grafts: Secondary | ICD-10-CM

## 2020-06-26 DIAGNOSIS — Z170 Estrogen receptor positive status [ER+]: Secondary | ICD-10-CM

## 2020-06-26 DIAGNOSIS — Z5111 Encounter for antineoplastic chemotherapy: Secondary | ICD-10-CM | POA: Diagnosis not present

## 2020-06-26 DIAGNOSIS — C50411 Malignant neoplasm of upper-outer quadrant of right female breast: Secondary | ICD-10-CM | POA: Diagnosis not present

## 2020-06-26 LAB — COMPREHENSIVE METABOLIC PANEL
ALT: 41 U/L (ref 0–44)
AST: 32 U/L (ref 15–41)
Albumin: 3.3 g/dL — ABNORMAL LOW (ref 3.5–5.0)
Alkaline Phosphatase: 90 U/L (ref 38–126)
Anion gap: 8 (ref 5–15)
BUN: 37 mg/dL — ABNORMAL HIGH (ref 8–23)
CO2: 25 mmol/L (ref 22–32)
Calcium: 9.4 mg/dL (ref 8.9–10.3)
Chloride: 104 mmol/L (ref 98–111)
Creatinine, Ser: 1.73 mg/dL — ABNORMAL HIGH (ref 0.44–1.00)
GFR calc Af Amer: 34 mL/min — ABNORMAL LOW (ref >60)
GFR calc non Af Amer: 29 mL/min — ABNORMAL LOW (ref >60)
Glucose, Bld: 198 mg/dL — ABNORMAL HIGH (ref 70–99)
Potassium: 4.5 mmol/L (ref 3.5–5.1)
Sodium: 137 mmol/L (ref 135–145)
Total Bilirubin: 0.4 mg/dL (ref 0.3–1.2)
Total Protein: 6.9 g/dL (ref 6.5–8.1)

## 2020-06-26 LAB — CBC WITH DIFFERENTIAL/PLATELET
Abs Immature Granulocytes: 0.01 10*3/uL (ref 0.00–0.07)
Basophils Absolute: 0.1 10*3/uL (ref 0.0–0.1)
Basophils Relative: 2 %
Eosinophils Absolute: 0.4 10*3/uL (ref 0.0–0.5)
Eosinophils Relative: 8 %
HCT: 38.9 % (ref 36.0–46.0)
Hemoglobin: 13.2 g/dL (ref 12.0–15.0)
Immature Granulocytes: 0 %
Lymphocytes Relative: 35 %
Lymphs Abs: 1.6 10*3/uL (ref 0.7–4.0)
MCH: 29.9 pg (ref 26.0–34.0)
MCHC: 33.9 g/dL (ref 30.0–36.0)
MCV: 88 fL (ref 80.0–100.0)
Monocytes Absolute: 0.1 10*3/uL (ref 0.1–1.0)
Monocytes Relative: 2 %
Neutro Abs: 2.5 10*3/uL (ref 1.7–7.7)
Neutrophils Relative %: 53 %
Platelets: 113 10*3/uL — ABNORMAL LOW (ref 150–400)
RBC: 4.42 MIL/uL (ref 3.87–5.11)
RDW: 12 % (ref 11.5–15.5)
WBC: 4.6 10*3/uL (ref 4.0–10.5)
nRBC: 0 % (ref 0.0–0.2)

## 2020-06-26 MED ORDER — SODIUM CHLORIDE 0.9% FLUSH
10.0000 mL | INTRAVENOUS | Status: DC | PRN
  Administered 2020-06-26: 10 mL via INTRAVENOUS
  Filled 2020-06-26: qty 10

## 2020-06-26 MED ORDER — HEPARIN SOD (PORK) LOCK FLUSH 100 UNIT/ML IV SOLN
500.0000 [IU] | Freq: Once | INTRAVENOUS | Status: AC
  Administered 2020-06-26: 500 [IU] via INTRAVENOUS
  Filled 2020-06-26: qty 5

## 2020-06-26 NOTE — Progress Notes (Signed)
Met with patient at registration to introduce myself as Arboriculturist and to offer available resources.  Discussed one-time $1000 Radio broadcast assistant to assist with personal expenses while gong through treatment. Patient states they are over the income guidelines which I verbally provided.  Gave her my card for any additional financial questions or concerns.

## 2020-06-26 NOTE — Patient Instructions (Signed)

## 2020-06-26 NOTE — Progress Notes (Signed)
[place]  [phone] [phone]     ID: Tasha Tucker DOB: [birth year]  MR#: [id number]  ZDG#:[id number]  Patient Care Team: Tasha Mclean, MD as PCP - General (Family Medicine) Tasha Frock, MD as Consulting Physician (Urology) Tasha Germany, RN as Oncology Nurse Navigator Tasha Kaufmann, RN as Oncology Nurse Navigator Tasha Keens, MD as Consulting Physician (General Surgery) Tasha Tucker, Tasha Dad, MD as Consulting Physician (Oncology) Tasha Rudd, MD as Consulting Physician (Radiation Oncology) Tasha Rear, MD as Consulting Physician (Endocrinology) Tasha Lips, MD as Consulting Physician (Nephrology) Tasha Cruel, MD OTHER MD:  CHIEF COMPLAINT: Estrogen receptor positive breast cancer  CURRENT TREATMENT: Adjuvant chemotherapy and radiation therapy   INTERVAL HISTORY: Tasha Tucker returns today for follow up and treatment of her estrogen receptor positive breast cancer.   She received her first cycle of adjuvant chemotherapy, consisting of cyclophosphamide, methotrexate and fluorouracil, on 06/19/2020. Today is day 8 cycle 1.   REVIEW OF SYSTEMS: [name] did remarkably well with her first cycle of chemotherapy.  She had no nausea, no mouth sores, and only minimal fatigue.  Of course she has had no hair was yet.  She had mild constipation, although she still had regular bowel movements.  She started Senokot for that.  Aside from this a detailed review of systems today was stable   HISTORY OF CURRENT ILLNESS: From the original intake note:  Tasha Tucker had routine screening mammography on 03/13/2020 showing a possible abnormality in the right breast. She underwent right diagnostic mammography with tomography and right breast ultrasonography at [facility] on 03/26/2020 showing: breast density category B; suspicious 1.9 cm mass/distortion in right breast at 9 o'clock; no right axillary  lymphadenopathy.  Accordingly that same day, she proceeded to biopsy of the right breast area in question. The pathology from this procedure ([id number]) showed: invasive and in situ mammary carcinoma, grade 2, e-cadherin negative. Prognostic indicators significant for: estrogen receptor, 90% positive and progesterone receptor, 90% positive, both with strong staining intensity. Proliferation marker Ki67 at 5%. HER2 negative by immunohistochemistry (1+).  The patient's subsequent history is as detailed below.   PAST MEDICAL HISTORY: Past Medical History:  Diagnosis Date  . Bladder cancer ([place])    [place]  . Breast cancer ([place])   . DJD (degenerative joint disease)    NECK, NEEDS PILLOW WHEN LYING FLAT IS POSSIBLE  . History of cerebral aneurysm repair    1996-- S/Tucker CLAPPING RIGHT CAROTID --- NO DEFICITS  . History of renal pelvis cancer    10-09-2016  S/Tucker  LEFT RADICAL NEPHROURETERECTOMY -- HIGH GRADE PAPILLARY UROTHELIAL CARCINOMA  . Hyperlipidemia   . Hypertension   . Liver hemangioma   . OA (osteoarthritis)   . PAC (premature atrial contraction)   . PONV (postoperative nausea and vomiting)   . Right trigger finger   . Type 2 diabetes mellitus ([place])   . Wears glasses     PAST SURGICAL HISTORY: Past Surgical History:  Procedure Laterality Date  . ABDOMINAL HYSTERECTOMY  1990   PARTIAL  . BREAST LUMPECTOMY WITH RADIOACTIVE SEED AND SENTINEL LYMPH NODE BIOPSY Right 04/17/2020   Procedure: BREAST LUMPECTOMY X 2  WITH RADIOACTIVE SEED AND SENTINEL LYMPH NODE BIOPSY;  Surgeon: Tasha Keens, MD;  Location: [facility];  Service: General;  Laterality: Right;  . CATARACT EXTRACTION W/ INTRAOCULAR LENS  IMPLANT, BILATERAL  2016; 2017  . CEREBRAL ANEURYSM REPAIR  1996   clipped-no deficits RIGHT CAROTID  .  CHOLECYSTECTOMY  2010  . CYSTOSCOPY W/ RETROGRADES Right 06/09/2017   Procedure: CYSTOSCOPY WITH RETROGRADE PYELOGRAM;  Surgeon: Tasha Frock, MD;  Location: [facility];  Service: Urology;  Laterality: Right;  . CYSTOSCOPY W/ RETROGRADES Right 07/21/2017   Procedure: CYSTOSCOPY WITH RETROGRADE PYELOGRAM;  Surgeon: Tasha Frock, MD;  Location: [facility];  Service: Urology;  Laterality: Right;  . CYSTOSCOPY WITH RETROGRADE PYELOGRAM, URETEROSCOPY AND STENT PLACEMENT Left 08/26/2016   Procedure: CYSTOSCOPY WITH BILATERAL RETROGRADE PYELOGRAM,LEFT URETEROSCOPY WITH BIOPSY AND LEFT STENT PLACEMENT;  Surgeon: Tasha Frock, MD;  Location: [facility];  Service: Urology;  Laterality: Left;  . PORTACATH PLACEMENT Left 06/18/2020   Procedure: INSERTION PORT-A-CATH WITH ULTRASOUND GUIDANCE;  Surgeon: Tasha Keens, MD;  Location: [facility];  Service: General;  Laterality: Left;  . ROBOT ASSITED LAPAROSCOPIC NEPHROURETERECTOMY Left 10/09/2016   Procedure: XI ROBOT ASSITED LAPAROSCOPIC NEPHROURETERECTOMY;  Surgeon: Tasha Frock, MD;  Location: [facility];  Service: Urology;  Laterality: Left;  . SURGERY ON FOOT FOR STAPH INFECTION  1989  . TRANSURETHRAL RESECTION OF BLADDER TUMOR N/A 07/21/2017   Procedure: TRANSURETHRAL RESECTION OF BLADDER TUMOR (TURBT);  Surgeon: Tasha Frock, MD;  Location: [facility];  Service: Urology;  Laterality: N/A;  . TRANSURETHRAL RESECTION OF BLADDER TUMOR WITH MITOMYCIN-C N/A 06/09/2017   Procedure: TRANSURETHRAL RESECTION OF BLADDER TUMOR WITH MITOMYCIN-C;  Surgeon: Tasha Frock, MD;  Location: [facility];  Service: Urology;  Laterality: N/A;  . TUBAL LIGATION  1980    FAMILY HISTORY: Family History  Problem Relation Age of Onset  . Cancer Mother   . Hyperlipidemia Father   . Heart attack Father   . Diabetes Brother   . Breast cancer Neg Hx    Her father died at age 18 from ASCVD. Her mother died at age 56. She reports her mother had a tumor in her thigh at age 79, possibly a sarcoma. The patient has one brother, with no history of cancer. Her son,  Tasha Tucker, had brain cancer (oligodedroglioma, possibly) and is doing well now at age 15 (as of 03/2020).   GYNECOLOGIC HISTORY:  No LMP recorded. Patient has had a hysterectomy. Menarche: 69 years old Age at first live birth: 69 years old Tasha Tucker 3 LMP 1990 Contraceptive: used pills for 8 years without issues HRT never used  Hysterectomy? Yes, 1990 BSO? 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 Husband Tasha Tucker is a retired Garment/textile technologist for the [employer]. She lives at home with husband Tasha Tucker and their cat, Springdale. Daughter Tasha Tucker, age 70, works as a Careers adviser in [place]. Son Tasha Tucker, age 57, works in Human resources officer in [place]. Son Tasha Tucker, age 8, lives in [place] and was previously a paramedic. Anara has 4 grandchildren. She attends a [employer].     ADVANCED DIRECTIVES: In the absence of any documentation to the contrary, the patient's spouse is their HCPOA.    HEALTH MAINTENANCE: Social History   Tobacco Use  . Smoking status: Never Smoker  . Smokeless tobacco: Never Used  Vaping Use  . Vaping Use: Never used  Substance Use Topics  . Alcohol use: Not Currently    Comment: , STOPPED  . Drug use: No     Colonoscopy: Cologuard 2020 was negative  PAP: date unknown, s/Tucker hysterectomy  Bone density: 03/2018, +1.5   Allergies  Allergen Reactions  . Adhesive [Tape] Rash    PAPER  TAPE ONLY  . Chlorhexidine Gluconate Itching and Rash    Topical antiseptic (CHG WIPES)   . Dilantin [Phenytoin] Rash  . Penicillins Rash    Has patient had a PCN reaction causing immediate rash, facial/tongue/throat swelling, SOB or lightheadedness with hypotension:unsure Has patient had a PCN reaction causing severe rash involving mucus membranes or skin necrosis:unsure Has patient had a PCN reaction that required hospitalization:No Has patient had a PCN reaction occurring within the last 10 years:No If all of  the above answers are "NO", then may proceed with Cephalosporin use.   . Sulfa Antibiotics Rash    Current Outpatient Medications  Medication Sig Dispense Refill  . acetaminophen (TYLENOL) 500 MG tablet Take 500 mg by mouth at bedtime.     Marland Kitchen anastrozole (ARIMIDEX) 1 MG tablet Take 1 tablet (1 mg total) by mouth daily. 90 tablet 4  . BD PEN NEEDLE NANO U/F 32G X 4 MM MISC USE FOR INSULIN ADMINISTRATION DAILY 100 each 3  . carvedilol (COREG) 6.25 MG tablet Take 1 tablet (6.25 mg total) by mouth 2 (two) times daily. 180 tablet 3  . cetirizine (ZYRTEC) 10 MG tablet Take 10 mg by mouth every morning.     Marland Kitchen HUMALOG KWIKPEN 100 UNIT/ML KwikPen Inject 5-6 Units into the skin 3 (three) times daily.     Marland Kitchen LANTUS SOLOSTAR 100 UNIT/ML Solostar Pen Inject 24- 26 units subcue daily for diabetes (Patient taking differently: Inject 16 Units into the skin every evening. ) 15 mL 6  . lidocaine-prilocaine (EMLA) cream Apply to affected area once 30 g 3  . ONETOUCH DELICA LANCETS 95J MISC Use lancet to stick finger and check blood sugar two times daily. 100 each 6  . ONETOUCH VERIO test strip USE AS DIRECTED. 300 each 2  . pravastatin (PRAVACHOL) 40 MG tablet Take 1 tablet (40 mg total) by mouth daily. 90 tablet 3  . prochlorperazine (COMPAZINE) 10 MG tablet Take 1 tablet (10 mg total) by mouth every 6 (six) hours as needed (Nausea or vomiting). 30 tablet 1   No current facility-administered medications for this visit.    OBJECTIVE: White woman in no acute distress Vitals:   06/26/20 1551  BP: 130/84  Pulse: 79  Resp: 18  Temp: (!) 97.5 F (36.4 C)  SpO2: 98%     Body mass index is 34.73 kg/m.   Wt Readings from Last 3 Encounters:  06/26/20 228 lb 6.4 oz (103.6 kg)  06/19/20 239 lb 1.6 oz (108.5 kg)  06/18/20 234 lb (106.1 kg)      ECOG FS:1 - Symptomatic but completely ambulatory  Sclerae unicteric, EOMs intact Wearing a mask No cervical or supraclavicular adenopathy Lungs no rales or  rhonchi Heart regular rate and rhythm Abd soft, obese, nontender, positive bowel sounds MSK no focal spinal tenderness, no upper extremity lymphedema Neuro: nonfocal, well oriented, appropriate affect Breasts: Deferred   LAB RESULTS:  CMP     Component Value Date/Time   NA 137 06/26/2020 1535   NA 140 04/21/2019 0000   K 4.5 06/26/2020 1535   CL 104 06/26/2020 1535   CO2 25 06/26/2020 1535   GLUCOSE 198 (H) 06/26/2020 1535   BUN 37 (H) 06/26/2020 1535   BUN 36 (A) 08/30/2017 0000   CREATININE 1.73 (H) 06/26/2020 1535   CREATININE 1.78 (H) 06/19/2020 0842   CALCIUM 9.4 06/26/2020 1535   PROT 6.9 06/26/2020 1535   ALBUMIN 3.3 (L) 06/26/2020 1535   AST 32 06/26/2020 1535  AST 24 06/19/2020 0842   ALT 41 06/26/2020 1535   ALT 30 06/19/2020 0842   ALKPHOS 90 06/26/2020 1535   BILITOT 0.4 06/26/2020 1535   BILITOT 0.5 06/19/2020 0842   GFRNONAA 29 (L) 06/26/2020 1535   GFRNONAA 28 (L) 06/19/2020 0842   GFRAA 34 (L) 06/26/2020 1535   GFRAA 33 (L) 06/19/2020 0842    No results found for: TOTALPROTELP, ALBUMINELP, A1GS, A2GS, BETS, BETA2SER, GAMS, MSPIKE, SPEI  Lab Results  Component Value Date   WBC 4.6 06/26/2020   NEUTROABS 2.5 06/26/2020   HGB 13.2 06/26/2020   HCT 38.9 06/26/2020   MCV 88.0 06/26/2020   PLT 113 (L) 06/26/2020    No results found for: LABCA2  No components found for: OIZTIW580  No results for input(s): INR in the last 168 hours.  No results found for: LABCA2  No results found for: DXI338  No results found for: SNK539  No results found for: JQB341  No results found for: CA2729  No components found for: HGQUANT  No results found for: CEA1 / No results found for: CEA1   No results found for: AFPTUMOR  No results found for: CHROMOGRNA  No results found for: KPAFRELGTCHN, LAMBDASER, KAPLAMBRATIO (kappa/lambda light chains)  No results found for: HGBA, HGBA2QUANT, HGBFQUANT, HGBSQUAN (Hemoglobinopathy evaluation)   No results  found for: LDH  No results found for: IRON, TIBC, IRONPCTSAT (Iron and TIBC)  No results found for: FERRITIN  Urinalysis    Component Value Date/Time   COLORURINE RED (A) 08/16/2016 0450   APPEARANCEUR CLOUDY (A) 08/16/2016 0450   LABSPEC 1.014 08/16/2016 0450   PHURINE 6.0 08/16/2016 0450   GLUCOSEU NEGATIVE 08/16/2016 0450   HGBUR LARGE (A) 08/16/2016 0450   BILIRUBINUR negative 06/27/2018 0934   KETONESUR negative 06/27/2018 [place] 08/16/2016 0450   PROTEINUR negative 06/27/2018 0934   PROTEINUR 100 (A) 08/16/2016 0450   UROBILINOGEN negative (A) 06/27/2018 0934   UROBILINOGEN 1.0 04/29/2013 1455   NITRITE Negative 06/27/2018 0934   NITRITE NEGATIVE 08/16/2016 0450   LEUKOCYTESUR Small (1+) (A) 06/27/2018 0934     STUDIES: DG CHEST PORT 1 VIEW  Result Date: 06/18/2020 CLINICAL DATA:  Port-A-Cath placement EXAM: PORTABLE CHEST 1 VIEW COMPARISON:  March 26, 2020 FINDINGS: Port-A-Cath tip is in the superior vena cava near the cavoatrial junction. No pneumothorax. No edema or airspace opacity. Heart is upper normal in size with pulmonary vascularity normal. No adenopathy. No bone lesions. IMPRESSION: Port-A-Cath tip in superior vena cava near cavoatrial junction. No pneumothorax. No edema or airspace opacity. Heart upper normal in size. Electronically Signed   By: Lowella Grip III M.D.   On: 06/18/2020 08:33   DG Fluoro Guide CV Line-No Report  Result Date: 06/18/2020 Fluoroscopy was utilized by the requesting physician.  No radiographic interpretation.     ELIGIBLE FOR AVAILABLE RESEARCH PROTOCOL: AET  ASSESSMENT: 69 y.o. [place] woman status post right breast upper outer quadrant biopsy 03/26/2020, for a clinical T1c N0, stage IA invasive lobular carcinoma, E-cadherin negative, grade 2, estrogen and progesterone receptor positive, with an MIB-1 of 5% and no HER-2 amplification  (a) unable to obtain breast MRI due to renal concern  with contrast status post nephrectomy  (1) status post right lumpectomy and sentinel lymph node sampling 04/17/2020 for a pT2 pN0, stage IB invasive lobular carcinoma, grade 2, with negative margins  (a) a single right axillary lymph node was removed.  (2) Oncotype score of 29 predicts  a risk of recurrence outside the breast within the next 9 years of 18% with antiestrogens only, and also a greater than 15% benefit from chemotherapy.  (3) chemotherapy consisting of cyclophosphamide, methotrexate and fluorouracil given every 21 days x 8, started 06/19/2020  (4) adjuvant radiation can be given concurrently with chemotherapy  (5) anastrozole started 04/03/2020 with anticipation of surgical delays, interrupted during chemotherapy   PLAN: Cledith is ready to start her adjuvant chemotherapy today.  She has a good understanding of the possible toxicity side effects and complications and has met with our chemotherapy teaching nurse.  I have taken all steroids out of her treatment plan and substituted different drugs for relief premeds given her poorly controlled diabetes.  I have asked her to take Compazine 10 mg before meals and at bedtime tomorrow and then if she has absolutely no nausea she can go down to 5 mg before meals and at bedtime the day after that.  Beginning with day 4 she may take Compazine as needed.  I have asked her to keep a symptom diary so that we can discuss when she returns to see me in a week and troubleshoot problems at that time.  In particular she understands she may be constipated and may have mild headaches from the premeds  Total encounter time 30 minutes.   Tasha Tucker. Tiara Maultsby, MD 06/26/2020 5:51 PM Medical Oncology and Hematology [facility] [place],  [address] Tel. [phone]    Fax. [phone]   This document serves as a record of services personally performed by Lurline Del, MD. It was created on his behalf by Wilburn Mylar, a trained medical scribe. The creation of this record is based on the scribe's personal observations and the provider's statements to them.   I, Lurline Del MD, have reviewed the above documentation for accuracy and completeness, and I agree with the above.   *Total Encounter Time as defined by the Centers for Medicare and Medicaid Services includes, in addition to the face-to-face time of a patient visit (documented in the note above) non-face-to-face time: obtaining and reviewing outside history, ordering and reviewing medications, tests or procedures, care coordination (communications with other health care professionals or caregivers) and documentation in the medical record.

## 2020-06-27 ENCOUNTER — Telehealth: Payer: Self-pay | Admitting: Oncology

## 2020-06-27 ENCOUNTER — Encounter (HOSPITAL_COMMUNITY): Payer: Self-pay | Admitting: Surgery

## 2020-06-27 NOTE — Telephone Encounter (Signed)
Scheduled office visits between appts that were already scheduled. Pt to get updated appt calendar at next visit per appt notes.

## 2020-07-10 ENCOUNTER — Other Ambulatory Visit: Payer: Medicare Other

## 2020-07-10 ENCOUNTER — Ambulatory Visit: Payer: Medicare Other

## 2020-07-10 NOTE — Progress Notes (Signed)
Tasha Tucker  [phone] [phone]     ID: Tasha Tucker DOB: 01/28/1951  MR#: [id number]  KNL#:[id number]  Patient Care Team: Darreld Mclean, MD as PCP - General (Family Medicine) Alexis Frock, MD as Consulting Physician (Urology) Rockwell Germany, RN as Oncology Nurse Navigator Mauro Kaufmann, RN as Oncology Nurse Navigator Coralie Keens, MD as Consulting Physician (General Surgery) Daritza Brees, Virgie Dad, MD as Consulting Physician (Oncology) Kyung Rudd, MD as Consulting Physician (Radiation Oncology) Madelin Rear, MD as Consulting Physician (Endocrinology) Madelon Lips, MD as Consulting Physician (Nephrology) Chauncey Cruel, MD OTHER MD:  CHIEF COMPLAINT: Estrogen receptor positive breast cancer  CURRENT TREATMENT: Adjuvant chemotherapy    INTERVAL HISTORY: Tasha Tucker returns today for follow up and treatment of her estrogen receptor positive breast cancer.   She received her first cycle of adjuvant chemotherapy, consisting of cyclophosphamide, methotrexate and fluorouracil, on 06/19/2020. Today is day 1 cycle 2.  However her Argyle is only 0.7.  We will have to postpone her treatment   REVIEW OF SYSTEMS: Tasha Tucker is losing quite a bit of hair.  She is interested in getting a wig.  She had a little itching around her port, but no mouth sores, mild constipation after the chemo, which has resolved, and really no other significant side effects.  A detailed review of systems was stable   HISTORY OF CURRENT ILLNESS: From the original intake note:  Tasha Tucker had routine screening mammography on 03/13/2020 showing a possible abnormality in the right breast. She underwent right diagnostic mammography with tomography and right breast ultrasonography at [facility] on 03/26/2020 showing: breast density category B; suspicious 1.9 cm mass/distortion in right breast at 9 o'clock; no right axillary lymphadenopathy.  Accordingly that  same day, she proceeded to biopsy of the right breast area in question. The pathology from this procedure ([id number]) showed: invasive and in situ mammary carcinoma, grade 2, e-cadherin negative. Prognostic indicators significant for: estrogen receptor, 90% positive and progesterone receptor, 90% positive, both with strong staining intensity. Proliferation marker Ki67 at 5%. HER2 negative by immunohistochemistry (1+).  The patient's subsequent history is as detailed below.   PAST MEDICAL HISTORY: Past Medical History:  Diagnosis Date  . Bladder cancer ([place])    [place]  . Breast cancer ([place])   . DJD (degenerative joint disease)    NECK, NEEDS PILLOW WHEN LYING FLAT IS POSSIBLE  . History of cerebral aneurysm repair    1996-- S/P CLAPPING RIGHT CAROTID --- NO DEFICITS  . 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 Hyperlipidemia   . Hypertension   . Liver hemangioma   . OA (osteoarthritis)   . PAC (premature atrial contraction)   . PONV (postoperative nausea and vomiting)   . Right trigger finger   . Type 2 diabetes mellitus ([place])   . Wears glasses     PAST SURGICAL HISTORY: Past Surgical History:  Procedure Laterality Date  . ABDOMINAL HYSTERECTOMY  1990   PARTIAL  . BREAST LUMPECTOMY WITH RADIOACTIVE SEED AND SENTINEL LYMPH NODE BIOPSY Right 04/17/2020   Procedure: BREAST LUMPECTOMY X 2  WITH RADIOACTIVE SEED AND SENTINEL LYMPH NODE BIOPSY;  Surgeon: Coralie Keens, MD;  Location: [facility];  Service: General;  Laterality: Right;  . CATARACT EXTRACTION W/ INTRAOCULAR LENS  IMPLANT, BILATERAL  2016; 2017  . CEREBRAL ANEURYSM REPAIR  1996   clipped-no deficits RIGHT  CAROTID  . CHOLECYSTECTOMY  2010  . CYSTOSCOPY W/ RETROGRADES Right 06/09/2017   Procedure: CYSTOSCOPY WITH RETROGRADE PYELOGRAM;  Surgeon: Alexis Frock, MD;  Location[facility];  Service: Urology;   Laterality: Right;  . CYSTOSCOPY W/ RETROGRADES Right 07/21/2017   Procedure: CYSTOSCOPY WITH RETROGRADE PYELOGRAM;  Surgeon: Alexis Frock, MD;  Location: [facility];  Service: Urology;  Laterality: Right;  . CYSTOSCOPY WITH RETROGRADE PYELOGRAM, URETEROSCOPY AND STENT PLACEMENT Left 08/26/2016   Procedure: CYSTOSCOPY WITH BILATERAL RETROGRADE PYELOGRAM,LEFT URETEROSCOPY WITH BIOPSY AND LEFT STENT PLACEMENT;  Surgeon: Alexis Frock, MD;  Location: [facility];  Service: Urology;  Laterality: Left;  . PORTACATH PLACEMENT Left 06/18/2020   Procedure: INSERTION PORT-A-CATH WITH ULTRASOUND GUIDANCE;  Surgeon: Coralie Keens, MD;  Location: [facility];  Service: General;  Laterality: Left;  . ROBOT ASSITED LAPAROSCOPIC NEPHROURETERECTOMY Left 10/09/2016   Procedure: XI ROBOT ASSITED LAPAROSCOPIC NEPHROURETERECTOMY;  Surgeon: Alexis Frock, MD;  Location: [facility];  Service: Urology;  Laterality: Left;  . SURGERY ON FOOT FOR STAPH INFECTION  1989  . TRANSURETHRAL RESECTION OF BLADDER TUMOR N/A 07/21/2017   Procedure: TRANSURETHRAL RESECTION OF BLADDER TUMOR (TURBT);  Surgeon: Alexis Frock, MD;  Location: [facility];  Service: Urology;  Laterality: N/A;  . TRANSURETHRAL RESECTION OF BLADDER TUMOR WITH MITOMYCIN-C N/A 06/09/2017   Procedure: TRANSURETHRAL RESECTION OF BLADDER TUMOR WITH MITOMYCIN-C;  Surgeon: Alexis Frock, MD;  Location: [facility];  Service: Urology;  Laterality: N/A;  . TUBAL LIGATION  1980    FAMILY HISTORY: Family History  Problem Relation Age of Onset  . Cancer Mother   . Hyperlipidemia Father   . Heart attack Father   . Diabetes Brother   . Breast cancer Neg Hx    Her father died at age 68 from ASCVD. Her mother died at age 30. She reports her mother had a tumor in her thigh at age 56, possibly a sarcoma. The patient has one brother, with no history of cancer. Her son, Tasha Tucker, had brain cancer (oligodedroglioma,  possibly) and is doing well now at age 17 (as of 03/2020).   GYNECOLOGIC HISTORY:  No LMP recorded. Patient has had a hysterectomy. Menarche: 69 years old Age at first live birth: 69 years old [place] P 3 LMP 1990 Contraceptive: used pills for 8 years without issues HRT never used  Hysterectomy? Yes, 1990 BSO? 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 Husband Tasha Tucker is a retired Garment/textile technologist for the [employer]. She lives at home with husband Tasha Tucker and their cat, Fairmont. Daughter Tasha Tucker, age 96, works as a Careers adviser in [place]. Son Tasha Tucker, age 49, works in Human resources officer in [place]. Son Tasha Tucker, age 46, lives in [place] and was previously a paramedic. Tasha Tucker has 4 grandchildren. She attends a [employer].     ADVANCED DIRECTIVES: In the absence of any documentation to the contrary, the patient's spouse is their HCPOA.    HEALTH MAINTENANCE: Social History   Tobacco Use  . Smoking status: Never Smoker  . Smokeless tobacco: Never Used  Vaping Use  . Vaping Use: Never used  Substance Use Topics  . Alcohol use: Not Currently    Comment: , STOPPED  . Drug use: No     Colonoscopy: Cologuard 2020 was negative  PAP: date unknown, s/p hysterectomy  Bone density: 03/2018, +1.5   Allergies  Allergen Reactions  . Adhesive [Tape] Rash  PAPER TAPE ONLY  . Chlorhexidine Gluconate Itching and Rash    Topical antiseptic (CHG WIPES)   . Dilantin [Phenytoin] Rash  . Penicillins Rash    Has patient had a PCN reaction causing immediate rash, facial/tongue/throat swelling, SOB or lightheadedness with hypotension:unsure Has patient had a PCN reaction causing severe rash involving mucus membranes or skin necrosis:unsure Has patient had a PCN reaction that required hospitalization:No Has patient had a PCN reaction occurring within the last 10 years:No If all of the above answers are "NO", then may  proceed with Cephalosporin use.   . Sulfa Antibiotics Rash    Current Outpatient Medications  Medication Sig Dispense Refill  . acetaminophen (TYLENOL) 500 MG tablet Take 500 mg by mouth at bedtime.     Marland Kitchen anastrozole (ARIMIDEX) 1 MG tablet Take 1 tablet (1 mg total) by mouth daily. 90 tablet 4  . BD PEN NEEDLE NANO U/F 32G X 4 MM MISC USE FOR INSULIN ADMINISTRATION DAILY 100 each 3  . carvedilol (COREG) 6.25 MG tablet Take 1 tablet (6.25 mg total) by mouth 2 (two) times daily. 180 tablet 3  . cetirizine (ZYRTEC) 10 MG tablet Take 10 mg by mouth every morning.     Marland Kitchen HUMALOG KWIKPEN 100 UNIT/ML KwikPen Inject 5-6 Units into the skin 3 (three) times daily.     Marland Kitchen LANTUS SOLOSTAR 100 UNIT/ML Solostar Pen Inject 24- 26 units subcue daily for diabetes (Patient taking differently: Inject 16 Units into the skin every evening. ) 15 mL 6  . lidocaine-prilocaine (EMLA) cream Apply to affected area once 30 g 3  . ONETOUCH DELICA LANCETS 03K MISC Use lancet to stick finger and check blood sugar two times daily. 100 each 6  . ONETOUCH VERIO test strip USE AS DIRECTED. 300 each 2  . pravastatin (PRAVACHOL) 40 MG tablet Take 1 tablet (40 mg total) by mouth daily. 90 tablet 3  . prochlorperazine (COMPAZINE) 10 MG tablet Take 1 tablet (10 mg total) by mouth every 6 (six) hours as needed (Nausea or vomiting). 30 tablet 1   No current facility-administered medications for this visit.    OBJECTIVE: White woman who appears stated age 58:   07/11/20 1246  BP: (!) 147/60  Pulse: 65  Resp: 18  Temp: 98.3 F (36.8 C)  SpO2: 98%     Body mass index is 34.88 kg/m.   Wt Readings from Last 3 Encounters:  07/11/20 229 lb 6.4 oz (104.1 kg)  06/26/20 228 lb 6.4 oz (103.6 kg)  06/19/20 239 lb 1.6 oz (108.5 kg)      ECOG FS:1 - Symptomatic but completely ambulatory  Sclerae unicteric, EOMs intact Wearing a mask No cervical or supraclavicular adenopathy Lungs no rales or rhonchi Heart regular rate and  rhythm Abd soft, nontender, positive bowel sounds MSK no focal spinal tenderness, no upper extremity lymphedema Neuro: nonfocal, well oriented, appropriate affect Breasts: Deferred   LAB RESULTS:  CMP     Component Value Date/Time   NA 139 07/11/2020 1230   NA 140 04/21/2019 0000   K 4.5 07/11/2020 1230   CL 106 07/11/2020 1230   CO2 28 07/11/2020 1230   GLUCOSE 216 (H) 07/11/2020 1230   BUN 33 (H) 07/11/2020 1230   BUN 36 (A) 08/30/2017 0000   CREATININE 1.72 (H) 07/11/2020 1230   CREATININE 1.78 (H) 06/19/2020 0842   CALCIUM 9.6 07/11/2020 1230   PROT 7.0 07/11/2020 1230   ALBUMIN 3.4 (L) 07/11/2020 1230   AST 28 07/11/2020 1230  AST 24 06/19/2020 0842   ALT 31 07/11/2020 1230   ALT 30 06/19/2020 0842   ALKPHOS 86 07/11/2020 1230   BILITOT 0.5 07/11/2020 1230   BILITOT 0.5 06/19/2020 0842   GFRNONAA 29 (L) 07/11/2020 1230   GFRNONAA 28 (L) 06/19/2020 0842   GFRAA 34 (L) 06/26/2020 1535   GFRAA 33 (L) 06/19/2020 0842    No results found for: TOTALPROTELP, ALBUMINELP, A1GS, A2GS, BETS, BETA2SER, GAMS, MSPIKE, SPEI  Lab Results  Component Value Date   WBC 2.8 (L) 07/11/2020   NEUTROABS 0.7 (L) 07/11/2020   HGB 13.7 07/11/2020   HCT 40.4 07/11/2020   MCV 90.8 07/11/2020   PLT 175 07/11/2020    No results found for: LABCA2  No components found for: JJHERD408  No results for input(s): INR in the last 168 hours.  No results found for: LABCA2  No results found for: XKG818  No results found for: HUD149  No results found for: FWY637  No results found for: CA2729  No components found for: HGQUANT  No results found for: CEA1 / No results found for: CEA1   No results found for: AFPTUMOR  No results found for: CHROMOGRNA  No results found for: KPAFRELGTCHN, LAMBDASER, KAPLAMBRATIO (kappa/lambda light chains)  No results found for: HGBA, HGBA2QUANT, HGBFQUANT, HGBSQUAN (Hemoglobinopathy evaluation)   No results found for: LDH  No results found  for: IRON, TIBC, IRONPCTSAT (Iron and TIBC)  No results found for: FERRITIN  Urinalysis    Component Value Date/Time   COLORURINE RED (A) 08/16/2016 0450   APPEARANCEUR CLOUDY (A) 08/16/2016 0450   LABSPEC 1.014 08/16/2016 0450   PHURINE 6.0 08/16/2016 0450   GLUCOSEU NEGATIVE 08/16/2016 0450   HGBUR LARGE (A) 08/16/2016 0450   BILIRUBINUR negative 06/27/2018 0934   KETONESUR negative 06/27/2018 [place] 08/16/2016 0450   PROTEINUR negative 06/27/2018 0934   PROTEINUR 100 (A) 08/16/2016 0450   UROBILINOGEN negative (A) 06/27/2018 0934   UROBILINOGEN 1.0 04/29/2013 1455   NITRITE Negative 06/27/2018 0934   NITRITE NEGATIVE 08/16/2016 0450   LEUKOCYTESUR Small (1+) (A) 06/27/2018 0934     STUDIES: DG CHEST PORT 1 VIEW  Result Date: 06/18/2020 CLINICAL DATA:  Port-A-Cath placement EXAM: PORTABLE CHEST 1 VIEW COMPARISON:  March 26, 2020 FINDINGS: Port-A-Cath tip is in the superior vena cava near the cavoatrial junction. No pneumothorax. No edema or airspace opacity. Heart is upper normal in size with pulmonary vascularity normal. No adenopathy. No bone lesions. IMPRESSION: Port-A-Cath tip in superior vena cava near cavoatrial junction. No pneumothorax. No edema or airspace opacity. Heart upper normal in size. Electronically Signed   By: Lowella Grip III M.D.   On: 06/18/2020 08:33   DG Fluoro Guide CV Line-No Report  Result Date: 06/18/2020 Fluoroscopy was utilized by the requesting physician.  No radiographic interpretation.     ELIGIBLE FOR AVAILABLE RESEARCH PROTOCOL: AET  ASSESSMENT: 69 y.o. [place] woman status post right breast upper outer quadrant biopsy 03/26/2020, for a clinical T1c N0, stage IA invasive lobular carcinoma, E-cadherin negative, grade 2, estrogen and progesterone receptor positive, with an MIB-1 of 5% and no HER-2 amplification  (a) unable to obtain breast MRI due to renal concern with contrast status post  nephrectomy  (1) status post right lumpectomy and sentinel lymph node sampling 04/17/2020 for a pT2 pN0, stage IB invasive lobular carcinoma, grade 2, with negative margins  (a) a single right axillary lymph node was removed.  (2) Oncotype score of 29  predicts a risk of recurrence outside the breast within the next 9 years of 18% with antiestrogens only, and also a greater than 15% benefit from chemotherapy.  (3) chemotherapy consisting of cyclophosphamide, methotrexate and fluorouracil given every 21 days x 8, started 06/19/2020  (4) adjuvant radiation can be given concurrently with chemotherapy  (5) anastrozole started 04/03/2020 with anticipation of surgical delays, interrupted during chemotherapy   PLAN: Billie's ANC today is less than 1.0.  We are accordingly holding CMF.  I try to order Neulasta for her but find it is not possible.  She will need to receive Granix subcu for 5 days and she will need to be taught to self administer.  She is going to return to see me next week.  Hopefully her counts will have recovered enough by then that we can treat her and then proceed with the immune boosters as just described.  She knows to call for any other issue that may develop before then  Total encounter time 30 minutes.Sarajane Jews C. Catalaya Garr, MD 07/11/2020 1:07 PM Medical Oncology and Hematology [facility] [place], [place] [address] Tel. [phone]    Fax. [phone]   This document serves as a record of services personally performed by Lurline Del, MD. It was created on his behalf by Wilburn Mylar, a trained medical scribe. The creation of this record is based on the scribe's personal observations and the provider's statements to them.   I, Lurline Del MD, have reviewed the above documentation for accuracy and completeness, and I agree with the above.   *Total Encounter Time as defined by the Centers for Medicare and Medicaid Services  includes, in addition to the face-to-face time of a patient visit (documented in the note above) non-face-to-face time: obtaining and reviewing outside history, ordering and reviewing medications, tests or procedures, care coordination (communications with other health care professionals or caregivers) and documentation in the medical record.

## 2020-07-11 ENCOUNTER — Inpatient Hospital Stay: Payer: Medicare Other

## 2020-07-11 ENCOUNTER — Inpatient Hospital Stay: Payer: Medicare Other | Attending: Oncology

## 2020-07-11 ENCOUNTER — Inpatient Hospital Stay (HOSPITAL_BASED_OUTPATIENT_CLINIC_OR_DEPARTMENT_OTHER): Payer: Medicare Other | Admitting: Oncology

## 2020-07-11 ENCOUNTER — Other Ambulatory Visit: Payer: Self-pay

## 2020-07-11 ENCOUNTER — Encounter: Payer: Self-pay | Admitting: *Deleted

## 2020-07-11 VITALS — BP 147/60 | HR 65 | Temp 98.3°F | Resp 18 | Ht 68.0 in | Wt 229.4 lb

## 2020-07-11 DIAGNOSIS — C652 Malignant neoplasm of left renal pelvis: Secondary | ICD-10-CM | POA: Diagnosis not present

## 2020-07-11 DIAGNOSIS — N184 Chronic kidney disease, stage 4 (severe): Secondary | ICD-10-CM | POA: Diagnosis not present

## 2020-07-11 DIAGNOSIS — Z170 Estrogen receptor positive status [ER+]: Secondary | ICD-10-CM | POA: Insufficient documentation

## 2020-07-11 DIAGNOSIS — Z452 Encounter for adjustment and management of vascular access device: Secondary | ICD-10-CM | POA: Insufficient documentation

## 2020-07-11 DIAGNOSIS — E785 Hyperlipidemia, unspecified: Secondary | ICD-10-CM | POA: Insufficient documentation

## 2020-07-11 DIAGNOSIS — E118 Type 2 diabetes mellitus with unspecified complications: Secondary | ICD-10-CM | POA: Diagnosis not present

## 2020-07-11 DIAGNOSIS — Z794 Long term (current) use of insulin: Secondary | ICD-10-CM

## 2020-07-11 DIAGNOSIS — I10 Essential (primary) hypertension: Secondary | ICD-10-CM | POA: Diagnosis not present

## 2020-07-11 DIAGNOSIS — Z5111 Encounter for antineoplastic chemotherapy: Secondary | ICD-10-CM | POA: Insufficient documentation

## 2020-07-11 DIAGNOSIS — Z5189 Encounter for other specified aftercare: Secondary | ICD-10-CM | POA: Diagnosis not present

## 2020-07-11 DIAGNOSIS — Z79811 Long term (current) use of aromatase inhibitors: Secondary | ICD-10-CM | POA: Insufficient documentation

## 2020-07-11 DIAGNOSIS — Z79899 Other long term (current) drug therapy: Secondary | ICD-10-CM | POA: Insufficient documentation

## 2020-07-11 DIAGNOSIS — C50411 Malignant neoplasm of upper-outer quadrant of right female breast: Secondary | ICD-10-CM | POA: Insufficient documentation

## 2020-07-11 DIAGNOSIS — Z95828 Presence of other vascular implants and grafts: Secondary | ICD-10-CM | POA: Insufficient documentation

## 2020-07-11 LAB — COMPREHENSIVE METABOLIC PANEL
ALT: 31 U/L (ref 0–44)
AST: 28 U/L (ref 15–41)
Albumin: 3.4 g/dL — ABNORMAL LOW (ref 3.5–5.0)
Alkaline Phosphatase: 86 U/L (ref 38–126)
Anion gap: 5 (ref 5–15)
BUN: 33 mg/dL — ABNORMAL HIGH (ref 8–23)
CO2: 28 mmol/L (ref 22–32)
Calcium: 9.6 mg/dL (ref 8.9–10.3)
Chloride: 106 mmol/L (ref 98–111)
Creatinine, Ser: 1.72 mg/dL — ABNORMAL HIGH (ref 0.44–1.00)
GFR, Estimated: 29 mL/min — ABNORMAL LOW (ref >60)
Glucose, Bld: 216 mg/dL — ABNORMAL HIGH (ref 70–99)
Potassium: 4.5 mmol/L (ref 3.5–5.1)
Sodium: 139 mmol/L (ref 135–145)
Total Bilirubin: 0.5 mg/dL (ref 0.3–1.2)
Total Protein: 7 g/dL (ref 6.5–8.1)

## 2020-07-11 LAB — CBC WITH DIFFERENTIAL/PLATELET
Abs Immature Granulocytes: 0.01 10*3/uL (ref 0.00–0.07)
Basophils Absolute: 0.1 10*3/uL (ref 0.0–0.1)
Basophils Relative: 3 %
Eosinophils Absolute: 0.2 10*3/uL (ref 0.0–0.5)
Eosinophils Relative: 7 %
HCT: 40.4 % (ref 36.0–46.0)
Hemoglobin: 13.7 g/dL (ref 12.0–15.0)
Immature Granulocytes: 0 %
Lymphocytes Relative: 46 %
Lymphs Abs: 1.3 10*3/uL (ref 0.7–4.0)
MCH: 30.8 pg (ref 26.0–34.0)
MCHC: 33.9 g/dL (ref 30.0–36.0)
MCV: 90.8 fL (ref 80.0–100.0)
Monocytes Absolute: 0.5 10*3/uL (ref 0.1–1.0)
Monocytes Relative: 18 %
Neutro Abs: 0.7 10*3/uL — ABNORMAL LOW (ref 1.7–7.7)
Neutrophils Relative %: 26 %
Platelets: 175 10*3/uL (ref 150–400)
RBC: 4.45 MIL/uL (ref 3.87–5.11)
RDW: 13.6 % (ref 11.5–15.5)
WBC: 2.8 10*3/uL — ABNORMAL LOW (ref 4.0–10.5)
nRBC: 0 % (ref 0.0–0.2)

## 2020-07-11 MED ORDER — SODIUM CHLORIDE 0.9% FLUSH
10.0000 mL | INTRAVENOUS | Status: DC | PRN
  Administered 2020-07-11: 10 mL
  Filled 2020-07-11: qty 10

## 2020-07-11 MED ORDER — HEPARIN SOD (PORK) LOCK FLUSH 100 UNIT/ML IV SOLN
500.0000 [IU] | Freq: Once | INTRAVENOUS | Status: AC | PRN
  Administered 2020-07-11: 500 [IU]
  Filled 2020-07-11: qty 5

## 2020-07-15 ENCOUNTER — Telehealth: Payer: Self-pay | Admitting: Oncology

## 2020-07-15 NOTE — Telephone Encounter (Signed)
Scheduled appt per 10/14 los - pt is aware of appts on 10/21 and 10/22

## 2020-07-18 ENCOUNTER — Other Ambulatory Visit: Payer: Self-pay

## 2020-07-18 ENCOUNTER — Encounter: Payer: Self-pay | Admitting: *Deleted

## 2020-07-18 ENCOUNTER — Inpatient Hospital Stay: Payer: Medicare Other

## 2020-07-18 ENCOUNTER — Inpatient Hospital Stay (HOSPITAL_BASED_OUTPATIENT_CLINIC_OR_DEPARTMENT_OTHER): Payer: Medicare Other | Admitting: Oncology

## 2020-07-18 VITALS — BP 133/68 | HR 63 | Temp 97.0°F | Resp 17 | Ht 68.0 in | Wt 228.4 lb

## 2020-07-18 DIAGNOSIS — C50411 Malignant neoplasm of upper-outer quadrant of right female breast: Secondary | ICD-10-CM | POA: Diagnosis not present

## 2020-07-18 DIAGNOSIS — Z170 Estrogen receptor positive status [ER+]: Secondary | ICD-10-CM

## 2020-07-18 DIAGNOSIS — C652 Malignant neoplasm of left renal pelvis: Secondary | ICD-10-CM

## 2020-07-18 DIAGNOSIS — Z95828 Presence of other vascular implants and grafts: Secondary | ICD-10-CM

## 2020-07-18 LAB — COMPREHENSIVE METABOLIC PANEL
ALT: 31 U/L (ref 0–44)
AST: 26 U/L (ref 15–41)
Albumin: 3.6 g/dL (ref 3.5–5.0)
Alkaline Phosphatase: 83 U/L (ref 38–126)
Anion gap: 8 (ref 5–15)
BUN: 30 mg/dL — ABNORMAL HIGH (ref 8–23)
CO2: 24 mmol/L (ref 22–32)
Calcium: 10.2 mg/dL (ref 8.9–10.3)
Chloride: 106 mmol/L (ref 98–111)
Creatinine, Ser: 1.7 mg/dL — ABNORMAL HIGH (ref 0.44–1.00)
GFR, Estimated: 32 mL/min — ABNORMAL LOW (ref >60)
Glucose, Bld: 157 mg/dL — ABNORMAL HIGH (ref 70–99)
Potassium: 4.5 mmol/L (ref 3.5–5.1)
Sodium: 138 mmol/L (ref 135–145)
Total Bilirubin: 0.5 mg/dL (ref 0.3–1.2)
Total Protein: 7.5 g/dL (ref 6.5–8.1)

## 2020-07-18 LAB — CBC WITH DIFFERENTIAL/PLATELET
Abs Immature Granulocytes: 0.02 10*3/uL (ref 0.00–0.07)
Basophils Absolute: 0.2 10*3/uL — ABNORMAL HIGH (ref 0.0–0.1)
Basophils Relative: 3 %
Eosinophils Absolute: 0.1 10*3/uL (ref 0.0–0.5)
Eosinophils Relative: 2 %
HCT: 45.4 % (ref 36.0–46.0)
Hemoglobin: 15.6 g/dL — ABNORMAL HIGH (ref 12.0–15.0)
Immature Granulocytes: 0 %
Lymphocytes Relative: 22 %
Lymphs Abs: 1.5 10*3/uL (ref 0.7–4.0)
MCH: 31 pg (ref 26.0–34.0)
MCHC: 34.4 g/dL (ref 30.0–36.0)
MCV: 90.3 fL (ref 80.0–100.0)
Monocytes Absolute: 0.6 10*3/uL (ref 0.1–1.0)
Monocytes Relative: 9 %
Neutro Abs: 4.2 10*3/uL (ref 1.7–7.7)
Neutrophils Relative %: 64 %
Platelets: 146 10*3/uL — ABNORMAL LOW (ref 150–400)
RBC: 5.03 MIL/uL (ref 3.87–5.11)
RDW: 13.8 % (ref 11.5–15.5)
WBC: 6.6 10*3/uL (ref 4.0–10.5)
nRBC: 0 % (ref 0.0–0.2)

## 2020-07-18 MED ORDER — SODIUM CHLORIDE 0.9% FLUSH
10.0000 mL | INTRAVENOUS | Status: DC | PRN
  Administered 2020-07-18: 10 mL
  Filled 2020-07-18: qty 10

## 2020-07-18 MED ORDER — HEPARIN SOD (PORK) LOCK FLUSH 100 UNIT/ML IV SOLN
500.0000 [IU] | Freq: Once | INTRAVENOUS | Status: AC | PRN
  Administered 2020-07-18: 500 [IU]
  Filled 2020-07-18: qty 5

## 2020-07-18 MED ORDER — GRANIX 480 MCG/0.8ML ~~LOC~~ SOSY: 480.0000 ug | PREFILLED_SYRINGE | SUBCUTANEOUS | 8 refills | Status: DC

## 2020-07-18 NOTE — Progress Notes (Signed)
[place]  [phone] [phone]     ID: Tasha Tucker DOB: 06-01-1951  MR#: [id number]  JYN#:[id number]  Patient Care Team: Darreld Mclean, MD as PCP - General (Family Medicine) Alexis Frock, MD as Consulting Physician (Urology) Rockwell Germany, RN as Oncology Nurse Navigator Mauro Kaufmann, RN as Oncology Nurse Navigator Coralie Keens, MD as Consulting Physician (General Surgery) Jakala Herford, Virgie Dad, MD as Consulting Physician (Oncology) Kyung Rudd, MD as Consulting Physician (Radiation Oncology) Madelin Rear, MD as Consulting Physician (Endocrinology) Madelon Lips, MD as Consulting Physician (Nephrology) Chauncey Cruel, MD OTHER MD:  CHIEF COMPLAINT: Estrogen receptor positive breast cancer  CURRENT TREATMENT: Adjuvant chemotherapy    INTERVAL HISTORY: Tasha Tucker returns today for follow up and treatment of her estrogen receptor positive breast cancer.   She received her first cycle of adjuvant chemotherapy, consisting of cyclophosphamide, methotrexate and fluorouracil, on 06/19/2020.  Her second cycle was delayed because her counts were too low.  We prescribed you Ellen Henri but her insurance refused to approve that and insist that she received Granix to self inject for 5 days after each CMF treatment.  We are still trying to get that accomplished  She is scheduled for her second CMF cycle tomorrow   REVIEW OF SYSTEMS: Tasha Tucker feels fine.  She tells me her sugars are doing well.  She is normally active.  A detailed review of systems was otherwise stable   HISTORY OF CURRENT ILLNESS: From the original intake note:  Tasha Tucker had routine screening mammography on 03/13/2020 showing a possible abnormality in the right breast. She underwent right diagnostic mammography with tomography and right breast ultrasonography at [facility] on 03/26/2020 showing: breast density category B; suspicious 1.9 cm mass/distortion in  right breast at 9 o'clock; no right axillary lymphadenopathy.  Accordingly that same day, she proceeded to biopsy of the right breast area in question. The pathology from this procedure ([id number]) showed: invasive and in situ mammary carcinoma, grade 2, e-cadherin negative. Prognostic indicators significant for: estrogen receptor, 90% positive and progesterone receptor, 90% positive, both with strong staining intensity. Proliferation marker Ki67 at 5%. HER2 negative by immunohistochemistry (1+).  The patient's subsequent history is as detailed below.   PAST MEDICAL HISTORY: Past Medical History:  Diagnosis Date   Bladder cancer ([place])    UROLOGIST- [facility]   Breast cancer ([place])    DJD (degenerative joint disease)    NECK, NEEDS PILLOW WHEN LYING FLAT IS POSSIBLE   History of cerebral aneurysm repair    1996-- S/P CLAPPING RIGHT CAROTID --- NO DEFICITS   History of renal pelvis cancer    10-09-2016  S/P  LEFT RADICAL NEPHROURETERECTOMY -- HIGH GRADE PAPILLARY UROTHELIAL CARCINOMA   Hyperlipidemia    Hypertension    Liver hemangioma    OA (osteoarthritis)    PAC (premature atrial contraction)    PONV (postoperative nausea and vomiting)    Right trigger finger    Type 2 diabetes mellitus ([place])    Wears glasses     PAST SURGICAL HISTORY: Past Surgical History:  Procedure Laterality Date   ABDOMINAL HYSTERECTOMY  1990   PARTIAL   BREAST LUMPECTOMY WITH RADIOACTIVE SEED AND SENTINEL LYMPH NODE BIOPSY Right 04/17/2020   Procedure: BREAST LUMPECTOMY X 2  WITH RADIOACTIVE SEED AND SENTINEL LYMPH NODE BIOPSY;  Surgeon: Coralie Keens, MD;  Location: [facility];  Service: General;  Laterality: Right;   CATARACT EXTRACTION W/ INTRAOCULAR LENS  IMPLANT, BILATERAL  2016; 2017  CEREBRAL ANEURYSM REPAIR  1996   clipped-no deficits RIGHT CAROTID   CHOLECYSTECTOMY  2010   CYSTOSCOPY W/ RETROGRADES Right 06/09/2017   Procedure: CYSTOSCOPY WITH RETROGRADE PYELOGRAM;   Surgeon: Alexis Frock, MD;  Location: [facility];  Service: Urology;  Laterality: Right;   CYSTOSCOPY W/ RETROGRADES Right 07/21/2017   Procedure: CYSTOSCOPY WITH RETROGRADE PYELOGRAM;  Surgeon: Alexis Frock, MD;  Location: [facility];  Service: Urology;  Laterality: Right;   CYSTOSCOPY WITH RETROGRADE PYELOGRAM, URETEROSCOPY AND STENT PLACEMENT Left 08/26/2016   Procedure: CYSTOSCOPY WITH BILATERAL RETROGRADE PYELOGRAM,LEFT URETEROSCOPY WITH BIOPSY AND LEFT STENT PLACEMENT;  Surgeon: Alexis Frock, MD;  Location: [facility];  Service: Urology;  Laterality: Left;   PORTACATH PLACEMENT Left 06/18/2020   Procedure: INSERTION PORT-A-CATH WITH ULTRASOUND GUIDANCE;  Surgeon: Coralie Keens, MD;  Location: [facility];  Service: General;  Laterality: Left;   ROBOT ASSITED LAPAROSCOPIC NEPHROURETERECTOMY Left 10/09/2016   Procedure: XI ROBOT ASSITED LAPAROSCOPIC NEPHROURETERECTOMY;  Surgeon: Alexis Frock, MD;  Location: [facility];  Service: Urology;  Laterality: Left;   SURGERY ON FOOT FOR STAPH INFECTION  1989   TRANSURETHRAL RESECTION OF BLADDER TUMOR N/A 07/21/2017   Procedure: TRANSURETHRAL RESECTION OF BLADDER TUMOR (TURBT);  Surgeon: Alexis Frock, MD;  Location[facility];  Service: Urology;  Laterality: N/A;   TRANSURETHRAL RESECTION OF BLADDER TUMOR WITH MITOMYCIN-C N/A 06/09/2017   Procedure: TRANSURETHRAL RESECTION OF BLADDER TUMOR WITH MITOMYCIN-C;  Surgeon: Alexis Frock, MD;  Location: [facility];  Service: Urology;  Laterality: N/A;   TUBAL LIGATION  1980    FAMILY HISTORY: Family History  Problem Relation Age of Onset   Cancer Mother    Hyperlipidemia Father    Heart attack Father    Diabetes Brother    Breast cancer Neg Hx    Her father died at age 59 from ASCVD. Her mother died at age 70. She reports her mother had a tumor in her thigh at age 7, possibly a sarcoma. The patient has  one brother, with no history of cancer. Her son, Tasha Tucker, had brain cancer (oligodedroglioma, possibly) and is doing well now at age 69 (as of 03/2020).   GYNECOLOGIC HISTORY:  No LMP recorded. Patient has had a hysterectomy. Menarche: 69 years old Age at first live birth: 69 years old [place] P 3 LMP 1990 Contraceptive: used pills for 8 years without issues HRT never used  Hysterectomy? Yes, 1990 BSO? 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 Husband Liliane Channel is a retired Garment/textile technologist for the [employer]. She lives at home with husband Liliane Channel and their cat, [place]. Daughter Bryson Ha, age 21, works as a Careers adviser in [place]. Son Tasha Tucker, age 83, works in Human resources officer in [place]. Son Ruby Cola, age 1, lives in [place] and was previously a paramedic. Brin has 4 grandchildren. She attends a [employer].     ADVANCED DIRECTIVES: In the absence of any documentation to the contrary, the patient's spouse is their HCPOA.    HEALTH MAINTENANCE: Social History   Tobacco Use   Smoking status: Never Smoker   Smokeless tobacco: Never Used  Vaping Use   Vaping Use: Never used  Substance Use Topics   Alcohol use: Not Currently    Comment: , STOPPED   Drug use: No     Colonoscopy: Cologuard 2020 was negative  PAP: date unknown, s/p hysterectomy  Bone density: 03/2018, +1.5  Allergies  Allergen Reactions   Adhesive [Tape] Rash    PAPER TAPE ONLY   Chlorhexidine Gluconate Itching and Rash    Topical antiseptic (CHG WIPES)    Dilantin [Phenytoin] Rash   Penicillins Rash    Has patient had a PCN reaction causing immediate rash, facial/tongue/throat swelling, SOB or lightheadedness with hypotension:unsure Has patient had a PCN reaction causing severe rash involving mucus membranes or skin necrosis:unsure Has patient had a PCN reaction that required hospitalization:No Has patient had a PCN reaction  occurring within the last 10 years:No If all of the above answers are "NO", then may proceed with Cephalosporin use.    Sulfa Antibiotics Rash    Current Outpatient Medications  Medication Sig Dispense Refill   acetaminophen (TYLENOL) 500 MG tablet Take 500 mg by mouth at bedtime.      anastrozole (ARIMIDEX) 1 MG tablet Take 1 tablet (1 mg total) by mouth daily. 90 tablet 4   BD PEN NEEDLE NANO U/F 32G X 4 MM MISC USE FOR INSULIN ADMINISTRATION DAILY 100 each 3   carvedilol (COREG) 6.25 MG tablet Take 1 tablet (6.25 mg total) by mouth 2 (two) times daily. 180 tablet 3   cetirizine (ZYRTEC) 10 MG tablet Take 10 mg by mouth every morning.      HUMALOG KWIKPEN 100 UNIT/ML KwikPen Inject 5-6 Units into the skin 3 (three) times daily.      LANTUS SOLOSTAR 100 UNIT/ML Solostar Pen Inject 24- 26 units subcue daily for diabetes (Patient taking differently: Inject 16 Units into the skin every evening. ) 15 mL 6   lidocaine-prilocaine (EMLA) cream Apply to affected area once 30 g 3   ONETOUCH DELICA LANCETS 57S MISC Use lancet to stick finger and check blood sugar two times daily. 100 each 6   ONETOUCH VERIO test strip USE AS DIRECTED. 300 each 2   pravastatin (PRAVACHOL) 40 MG tablet Take 1 tablet (40 mg total) by mouth daily. 90 tablet 3   prochlorperazine (COMPAZINE) 10 MG tablet Take 1 tablet (10 mg total) by mouth every 6 (six) hours as needed (Nausea or vomiting). 30 tablet 1   No current facility-administered medications for this visit.    OBJECTIVE: White woman who appears stated age 29:   07/18/20 1051  BP: 133/68  Pulse: 63  Resp: 17  Temp: (!) 97 F (36.1 C)  SpO2: 100%     Body mass index is 34.73 kg/m.   Wt Readings from Last 3 Encounters:  07/18/20 228 lb 6.4 oz (103.6 kg)  07/11/20 229 lb 6.4 oz (104.1 kg)  06/26/20 228 lb 6.4 oz (103.6 kg)      ECOG FS:1 - Symptomatic but completely ambulatory  Sclerae unicteric, EOMs intact Wearing a mask No  cervical or supraclavicular adenopathy Lungs no rales or rhonchi Heart regular rate and rhythm Abd soft, nontender, positive bowel sounds MSK no focal spinal tenderness, no upper extremity lymphedema Neuro: nonfocal, well oriented, appropriate affect Breasts: Deferred  LAB RESULTS:  CMP     Component Value Date/Time   NA 139 07/11/2020 1230   NA 140 04/21/2019 0000   K 4.5 07/11/2020 1230   CL 106 07/11/2020 1230   CO2 28 07/11/2020 1230   GLUCOSE 216 (H) 07/11/2020 1230   BUN 33 (H) 07/11/2020 1230   BUN 36 (A) 08/30/2017 0000   CREATININE 1.72 (H) 07/11/2020 1230   CREATININE 1.78 (H) 06/19/2020 0842   CALCIUM 9.6 07/11/2020 1230   PROT 7.0 07/11/2020 1230  ALBUMIN 3.4 (L) 07/11/2020 1230   AST 28 07/11/2020 1230   AST 24 06/19/2020 0842   ALT 31 07/11/2020 1230   ALT 30 06/19/2020 0842   ALKPHOS 86 07/11/2020 1230   BILITOT 0.5 07/11/2020 1230   BILITOT 0.5 06/19/2020 0842   GFRNONAA 29 (L) 07/11/2020 1230   GFRNONAA 28 (L) 06/19/2020 0842   GFRAA 34 (L) 06/26/2020 1535   GFRAA 33 (L) 06/19/2020 0842    No results found for: TOTALPROTELP, ALBUMINELP, A1GS, A2GS, BETS, BETA2SER, GAMS, MSPIKE, SPEI  Lab Results  Component Value Date   WBC 6.6 07/18/2020   NEUTROABS 4.2 07/18/2020   HGB 15.6 (H) 07/18/2020   HCT 45.4 07/18/2020   MCV 90.3 07/18/2020   PLT 146 (L) 07/18/2020    No results found for: LABCA2  No components found for: NATFTD322  No results for input(s): INR in the last 168 hours.  No results found for: LABCA2  No results found for: GUR427  No results found for: CWC376  No results found for: EGB151  No results found for: CA2729  No components found for: HGQUANT  No results found for: CEA1 / No results found for: CEA1   No results found for: AFPTUMOR  No results found for: CHROMOGRNA  No results found for: KPAFRELGTCHN, LAMBDASER, KAPLAMBRATIO (kappa/lambda light chains)  No results found for: HGBA, HGBA2QUANT, HGBFQUANT,  HGBSQUAN (Hemoglobinopathy evaluation)   No results found for: LDH  No results found for: IRON, TIBC, IRONPCTSAT (Iron and TIBC)  No results found for: FERRITIN  Urinalysis    Component Value Date/Time   COLORURINE RED (A) 08/16/2016 0450   APPEARANCEUR CLOUDY (A) 08/16/2016 0450   LABSPEC 1.014 08/16/2016 0450   PHURINE 6.0 08/16/2016 0450   GLUCOSEU NEGATIVE 08/16/2016 0450   HGBUR LARGE (A) 08/16/2016 0450   BILIRUBINUR negative 06/27/2018 0934   KETONESUR negative 06/27/2018 [place] 08/16/2016 0450   PROTEINUR negative 06/27/2018 0934   PROTEINUR 100 (A) 08/16/2016 0450   UROBILINOGEN negative (A) 06/27/2018 0934   UROBILINOGEN 1.0 04/29/2013 1455   NITRITE Negative 06/27/2018 0934   NITRITE NEGATIVE 08/16/2016 0450   LEUKOCYTESUR Small (1+) (A) 06/27/2018 0934     STUDIES: No results found.   ELIGIBLE FOR AVAILABLE RESEARCH PROTOCOL: AET  ASSESSMENT: 69 y.o. [place] woman status post right breast upper outer quadrant biopsy 03/26/2020, for a clinical T1c N0, stage IA invasive lobular carcinoma, E-cadherin negative, grade 2, estrogen and progesterone receptor positive, with an MIB-1 of 5% and no HER-2 amplification  (a) unable to obtain breast MRI due to renal concern with contrast status post nephrectomy  (1) status post right lumpectomy and sentinel lymph node sampling 04/17/2020 for a pT2 pN0, stage IB invasive lobular carcinoma, grade 2, with negative margins  (a) a single right axillary lymph node was removed.  (2) Oncotype score of 29 predicts a risk of recurrence outside the breast within the next 9 years of 18% with antiestrogens only, and also a greater than 15% benefit from chemotherapy.  (3) chemotherapy consisting of cyclophosphamide, methotrexate and fluorouracil given every 21 days x 8, started 06/19/2020  (4) adjuvant radiation can be given concurrently with chemotherapy  (5) anastrozole started 04/03/2020 with  anticipation of surgical delays, interrupted during chemotherapy   PLAN: Tasha Tucker's counts have recovered and she will be able to get treated tomorrow.  She will definitely need growth factor support starting Monday or Tuesday, October 25 or 26.  We have placed the order  today and I have discussed it with our pharmacy.  I do not know what pharmacy the insurance wants the orders sent to but they can forward to that.  I understand that they well within 24 hours of receiving the order get the medication in the patient's hands.  Jeris tells me she gives herself insulin all the time and she knows how to give herself shots safely and cleanly.  Her next treatment will be 3 weeks from today and we are changing her entire schedule because of this delay  Total encounter time today nearly 40 minutes.Sarajane Jews C. Kourtlyn Charlet, MD 07/18/2020 11:07 AM Medical Oncology and Hematology [facility] [place], [place] [address] Tel. [phone]    Fax. [phone]   This document serves as a record of services personally performed by Lurline Del, MD. It was created on his behalf by Wilburn Mylar, a trained medical scribe. The creation of this record is based on the scribe's personal observations and the provider's statements to them.   I, Lurline Del MD, have reviewed the above documentation for accuracy and completeness, and I agree with the above.   *Total Encounter Time as defined by the Centers for Medicare and Medicaid Services includes, in addition to the face-to-face time of a patient visit (documented in the note above) non-face-to-face time: obtaining and reviewing outside history, ordering and reviewing medications, tests or procedures, care coordination (communications with other health care professionals or caregivers) and documentation in the medical record.

## 2020-07-19 ENCOUNTER — Inpatient Hospital Stay: Payer: Medicare Other

## 2020-07-19 ENCOUNTER — Other Ambulatory Visit: Payer: Self-pay

## 2020-07-19 VITALS — BP 130/54 | HR 65 | Temp 98.4°F | Resp 17

## 2020-07-19 DIAGNOSIS — N184 Chronic kidney disease, stage 4 (severe): Secondary | ICD-10-CM

## 2020-07-19 DIAGNOSIS — C50411 Malignant neoplasm of upper-outer quadrant of right female breast: Secondary | ICD-10-CM | POA: Diagnosis not present

## 2020-07-19 DIAGNOSIS — E118 Type 2 diabetes mellitus with unspecified complications: Secondary | ICD-10-CM

## 2020-07-19 DIAGNOSIS — Z794 Long term (current) use of insulin: Secondary | ICD-10-CM

## 2020-07-19 DIAGNOSIS — Z170 Estrogen receptor positive status [ER+]: Secondary | ICD-10-CM

## 2020-07-19 MED ORDER — PROCHLORPERAZINE EDISYLATE 10 MG/2ML IJ SOLN
10.0000 mg | Freq: Once | INTRAMUSCULAR | Status: AC
  Administered 2020-07-19: 10 mg via INTRAVENOUS

## 2020-07-19 MED ORDER — SODIUM CHLORIDE 0.9% FLUSH
10.0000 mL | INTRAVENOUS | Status: DC | PRN
  Administered 2020-07-19: 10 mL
  Filled 2020-07-19: qty 10

## 2020-07-19 MED ORDER — METHOTREXATE SODIUM (PF) CHEMO INJECTION 250 MG/10ML
30.0000 mg/m2 | Freq: Once | INTRAMUSCULAR | Status: AC
  Administered 2020-07-19: 67 mg via INTRAVENOUS
  Filled 2020-07-19: qty 2.68

## 2020-07-19 MED ORDER — PALONOSETRON HCL INJECTION 0.25 MG/5ML
INTRAVENOUS | Status: AC
  Filled 2020-07-19: qty 5

## 2020-07-19 MED ORDER — PROCHLORPERAZINE EDISYLATE 10 MG/2ML IJ SOLN
INTRAMUSCULAR | Status: AC
  Filled 2020-07-19: qty 2

## 2020-07-19 MED ORDER — SODIUM CHLORIDE 0.9 % IV SOLN
Freq: Once | INTRAVENOUS | Status: AC
  Filled 2020-07-19: qty 250

## 2020-07-19 MED ORDER — SODIUM CHLORIDE 0.9 % IV SOLN
500.0000 mg/m2 | Freq: Once | INTRAVENOUS | Status: AC
  Administered 2020-07-19: 1120 mg via INTRAVENOUS
  Filled 2020-07-19: qty 56

## 2020-07-19 MED ORDER — SODIUM CHLORIDE 0.9 % IV SOLN
150.0000 mg | Freq: Once | INTRAVENOUS | Status: AC
  Administered 2020-07-19: 150 mg via INTRAVENOUS
  Filled 2020-07-19: qty 150

## 2020-07-19 MED ORDER — FLUOROURACIL CHEMO INJECTION 2.5 GM/50ML
600.0000 mg/m2 | Freq: Once | INTRAVENOUS | Status: AC
  Administered 2020-07-19: 1350 mg via INTRAVENOUS
  Filled 2020-07-19: qty 27

## 2020-07-19 MED ORDER — PALONOSETRON HCL INJECTION 0.25 MG/5ML
0.2500 mg | Freq: Once | INTRAVENOUS | Status: AC
  Administered 2020-07-19: 0.25 mg via INTRAVENOUS

## 2020-07-19 MED ORDER — HEPARIN SOD (PORK) LOCK FLUSH 100 UNIT/ML IV SOLN
500.0000 [IU] | Freq: Once | INTRAVENOUS | Status: AC | PRN
  Administered 2020-07-19: 500 [IU]
  Filled 2020-07-19: qty 5

## 2020-07-19 NOTE — Patient Instructions (Signed)
[facility] Discharge Instructions for Patients Receiving Chemotherapy  Today you received the following chemotherapy agents: Cytoxan/Methotrexate/5FU.  To help prevent nausea and vomiting after your treatment, we encourage you to take your nausea medication as directed.   If you develop nausea and vomiting that is not controlled by your nausea medication, call the clinic.   BELOW ARE SYMPTOMS THAT SHOULD BE REPORTED IMMEDIATELY:  *FEVER GREATER THAN 100.5 F  *CHILLS WITH OR WITHOUT FEVER  NAUSEA AND VOMITING THAT IS NOT CONTROLLED WITH YOUR NAUSEA MEDICATION  *UNUSUAL SHORTNESS OF BREATH  *UNUSUAL BRUISING OR BLEEDING  TENDERNESS IN MOUTH AND THROAT WITH OR WITHOUT PRESENCE OF ULCERS  *URINARY PROBLEMS  *BOWEL PROBLEMS  UNUSUAL RASH Items with * indicate a potential emergency and should be followed up as soon as possible.  Feel free to call the clinic should you have any questions or concerns. The clinic phone number is [phone].  Please show the [place] at check-in to the Emergency Department and triage nurse.

## 2020-07-19 NOTE — Progress Notes (Signed)
Per Dr. Virgie Dad note from 07/18/20, okay to continue with treatment today.

## 2020-07-19 NOTE — Addendum Note (Signed)
Addended by: Chauncey Cruel on: 07/19/2020 08:36 AM   Modules accepted: Orders

## 2020-07-22 ENCOUNTER — Inpatient Hospital Stay: Payer: Medicare Other

## 2020-07-22 ENCOUNTER — Other Ambulatory Visit: Payer: Self-pay

## 2020-07-22 ENCOUNTER — Ambulatory Visit: Payer: Medicare Other | Attending: Surgery

## 2020-07-22 ENCOUNTER — Telehealth: Payer: Self-pay | Admitting: Oncology

## 2020-07-22 VITALS — BP 153/68 | HR 68 | Temp 98.4°F | Resp 18

## 2020-07-22 DIAGNOSIS — N184 Chronic kidney disease, stage 4 (severe): Secondary | ICD-10-CM

## 2020-07-22 DIAGNOSIS — C50411 Malignant neoplasm of upper-outer quadrant of right female breast: Secondary | ICD-10-CM | POA: Diagnosis not present

## 2020-07-22 DIAGNOSIS — Z483 Aftercare following surgery for neoplasm: Secondary | ICD-10-CM

## 2020-07-22 DIAGNOSIS — Z170 Estrogen receptor positive status [ER+]: Secondary | ICD-10-CM

## 2020-07-22 DIAGNOSIS — Z794 Long term (current) use of insulin: Secondary | ICD-10-CM

## 2020-07-22 MED ORDER — PEGFILGRASTIM-CBQV 6 MG/0.6ML ~~LOC~~ SOSY
PREFILLED_SYRINGE | SUBCUTANEOUS | Status: AC
  Filled 2020-07-22: qty 0.6

## 2020-07-22 MED ORDER — PEGFILGRASTIM-CBQV 6 MG/0.6ML ~~LOC~~ SOSY
6.0000 mg | PREFILLED_SYRINGE | Freq: Once | SUBCUTANEOUS | Status: AC
  Administered 2020-07-22: 6 mg via SUBCUTANEOUS

## 2020-07-22 NOTE — Therapy (Signed)
[place], [place], [address] Phone: [phone]   Fax:  [phone]  Physical Therapy Treatment  Patient Details  Name: Tasha Tucker MRN: [id number] Date of Birth: 1951-09-17 Referring Provider (PT): Dr. Coralie Keens   Encounter Date: 07/22/2020   PT End of Session - 07/22/20 0924    Visit Number 2   # unchanged due to screen only   Number of Visits 2    Date for PT Re-Evaluation 05/29/20    PT Start Time 0916    PT Stop Time 0924    PT Time Calculation (min) 8 min    Activity Tolerance Patient tolerated treatment well    Behavior During Therapy [facility] for tasks assessed/performed           Past Medical History:  Diagnosis Date  . Bladder cancer ([place])    [place]  . Breast cancer ([place])   . DJD (degenerative joint disease)    NECK, NEEDS PILLOW WHEN LYING FLAT IS POSSIBLE  . History of cerebral aneurysm repair    1996-- S/P CLAPPING RIGHT CAROTID --- NO DEFICITS  . 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 Hyperlipidemia   . Hypertension   . Liver hemangioma   . OA (osteoarthritis)   . PAC (premature atrial contraction)   . PONV (postoperative nausea and vomiting)   . Right trigger finger   . Type 2 diabetes mellitus ([place])   . Wears glasses     Past Surgical History:  Procedure Laterality Date  . ABDOMINAL HYSTERECTOMY  1990   PARTIAL  . BREAST LUMPECTOMY WITH RADIOACTIVE SEED AND SENTINEL LYMPH NODE BIOPSY Right 04/17/2020   Procedure: BREAST LUMPECTOMY X 2  WITH RADIOACTIVE SEED AND SENTINEL LYMPH NODE BIOPSY;  Surgeon: Coralie Keens, MD;  Location: [facility];  Service: General;  Laterality: Right;  . CATARACT EXTRACTION W/ INTRAOCULAR LENS  IMPLANT, BILATERAL  2016; 2017  . CEREBRAL ANEURYSM REPAIR  1996   clipped-no deficits RIGHT CAROTID  . CHOLECYSTECTOMY  2010  .  CYSTOSCOPY W/ RETROGRADES Right 06/09/2017   Procedure: CYSTOSCOPY WITH RETROGRADE PYELOGRAM;  Surgeon: Alexis Frock, MD;  Location: [facility];  Service: Urology;  Laterality: Right;  . CYSTOSCOPY W/ RETROGRADES Right 07/21/2017   Procedure: CYSTOSCOPY WITH RETROGRADE PYELOGRAM;  Surgeon: Alexis Frock, MD;  Location: [facility];  Service: Urology;  Laterality: Right;  . CYSTOSCOPY WITH RETROGRADE PYELOGRAM, URETEROSCOPY AND STENT PLACEMENT Left 08/26/2016   Procedure: CYSTOSCOPY WITH BILATERAL RETROGRADE PYELOGRAM,LEFT URETEROSCOPY WITH BIOPSY AND LEFT STENT PLACEMENT;  Surgeon: Alexis Frock, MD;  Location: [facility];  Service: Urology;  Laterality: Left;  . PORTACATH PLACEMENT Left 06/18/2020   Procedure: INSERTION PORT-A-CATH WITH ULTRASOUND GUIDANCE;  Surgeon: Coralie Keens, MD;  Location: [facility];  Service: General;  Laterality: Left;  . ROBOT ASSITED LAPAROSCOPIC NEPHROURETERECTOMY Left 10/09/2016   Procedure: XI ROBOT ASSITED LAPAROSCOPIC NEPHROURETERECTOMY;  Surgeon: Alexis Frock, MD;  [facility];  Service: Urology;  Laterality: Left;  . SURGERY ON FOOT FOR STAPH INFECTION  1989  . TRANSURETHRAL RESECTION OF BLADDER TUMOR N/A 07/21/2017   Procedure: TRANSURETHRAL RESECTION OF BLADDER TUMOR (TURBT);  Surgeon: Alexis Frock, MD;  Location: [facility];  Service: Urology;  Laterality: N/A;  . TRANSURETHRAL RESECTION OF BLADDER TUMOR WITH MITOMYCIN-C N/A 06/09/2017   Procedure: TRANSURETHRAL RESECTION OF BLADDER TUMOR WITH MITOMYCIN-C;  Surgeon: Alexis Frock, MD;  Location: [facility];  Service: Urology;  Laterality: N/A;  . TUBAL LIGATION  1980    There were no vitals filed for this visit.   Subjective Assessment - 07/22/20 0919    Subjective Pt returns for her 3 month L-Dex screen.    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 She underwent a right double lumpectomy with a sentinel node biopsy (1 negative node) on 04/17/2020. It is ER/PR positive and HER2 negative with a Ki67 of 5%. She has a history of bladder and renal carcinoma from 06/2017.                  L-DEX FLOWSHEETS - 07/22/20 0900      L-DEX LYMPHEDEMA SCREENING   BASELINE SCORE (UNILATERAL) 4.1    L-DEX SCORE (UNILATERAL) 1.3    VALUE CHANGE (UNILAT) -2.8                                  PT Long Term Goals - 05/13/20 1444      PT LONG TERM GOAL #1   Title Patient will dmeonstrate she has regained full shoulder ROM and function post operatively compared to baselines.    Time 8    Period Weeks    Status Achieved                 Plan - 07/22/20 0924    Clinical Impression Statement Pt returns for 3 month L-Dex screen. Her change from baseline of -2.8 is WNLs so no further treatment needed at this time except to cont every 3 month L-Dex screens for up to 2 years from [facility] surgery.    PT Next Visit Plan Cont L-Dex screens every 3 months.    Consulted and Agree with Plan of Care Patient           Patient will benefit from skilled therapeutic intervention in order to improve the following deficits and impairments:     Visit Diagnosis: Aftercare following surgery for neoplasm     Problem List Patient Active Problem List   Diagnosis Date Noted  . Port-A-Cath in place 07/11/2020  . Malignant neoplasm of upper-outer quadrant of right breast in female, estrogen receptor positive ([place]) 03/28/2020  . Controlled type 2 diabetes mellitus with complication, with long-term current use of insulin ([place]) 03/19/2018  . Hyperlipidemia 01/12/2018  . Cerebral aneurysm 12/29/2017  . Secondary hypertension, unspecified 12/29/2017  . CKD (chronic kidney disease) stage 4, GFR 15-29 ml/min ([facility]) 12/29/2017  . Cancer of renal pelvis, left ([place]) 10/12/2016  . Renal mass 10/09/2016    Otelia Limes, PTA 07/22/2020, 9:28 AM  [place], [place], [address] Phone: [phone]   Fax:  [phone]  Name: Tasha Tucker MRN: [id number] Date of Birth: March 25, 1951

## 2020-07-22 NOTE — Telephone Encounter (Signed)
Scheduled per 10/21 los, patient is notified per My chart.

## 2020-07-22 NOTE — Patient Instructions (Signed)

## 2020-07-23 ENCOUNTER — Telehealth: Payer: Self-pay

## 2020-07-30 ENCOUNTER — Encounter: Payer: Self-pay | Admitting: *Deleted

## 2020-07-31 ENCOUNTER — Other Ambulatory Visit: Payer: Medicare Other

## 2020-07-31 ENCOUNTER — Ambulatory Visit: Payer: Medicare Other

## 2020-07-31 ENCOUNTER — Ambulatory Visit: Payer: Medicare Other | Admitting: Medical

## 2020-08-05 NOTE — Progress Notes (Signed)
[place]  [phone] [phone]) [phone]     ID: Tasha Tucker DOB: 09-Oct-1950  MR#: [id number]  SEG#:[id number]  Patient Care Team: Darreld Mclean, MD as PCP - General (Family Medicine) Alexis Frock, MD as Consulting Physician (Urology) Rockwell Germany, RN as Oncology Nurse Navigator Mauro Kaufmann, RN as Oncology Nurse Navigator Coralie Keens, MD as Consulting Physician (General Surgery) Anakin Varkey, Virgie Dad, MD as Consulting Physician (Oncology) Kyung Rudd, MD as Consulting Physician (Radiation Oncology) Madelin Rear, MD as Consulting Physician (Endocrinology) Madelon Lips, MD as Consulting Physician (Nephrology) Chauncey Cruel, MD OTHER MD:  CHIEF COMPLAINT: Estrogen receptor positive breast cancer  CURRENT TREATMENT: Adjuvant chemotherapy    INTERVAL HISTORY: Tasha Tucker returns today for follow up and treatment of her estrogen receptor positive breast cancer.   She received her first cycle of adjuvant chemotherapy, consisting of cyclophosphamide, methotrexate and fluorouracil, on 06/19/2020.  Her second cycle was delayed because her counts were too low.  We prescribed Udenyca but her insurance initially refused to approve that and insisted that she received Granix to self inject for 5 days after each CMF treatment.  That was cleared up.  She then received her 2nd cycle on 07/19/2020, with Udenyca day 3.  She tolerated that well.  I checked with pharmacy today to make sure that this has been approved which it has.  She returns today for cycle 3.   REVIEW OF SYSTEMS: [name] is having some mild urinary symptoms and she is concerned she may be developing a urine infection.  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  She wonders when she is going to start her radiation treatments and I have sent a note to Dr. Lisbeth Renshaw requesting some clarification regarding that.  Otherwise she is doing remarkably well.   COVID 19  VACCINATION STATUS: Status post [place] x2, most recently February 2021  HISTORY OF CURRENT ILLNESS: From the original intake note:  Tasha Tucker had routine screening mammography on 03/13/2020 showing a possible abnormality in the right breast. She underwent right diagnostic mammography with tomography and right breast ultrasonography at [facility] on 03/26/2020 showing: breast density category B; suspicious 1.9 cm mass/distortion in right breast at 9 o'clock; no right axillary lymphadenopathy.  Accordingly that same day, she proceeded to biopsy of the right breast area in question. The pathology from this procedure ([id number]) showed: invasive and in situ mammary carcinoma, grade 2, e-cadherin negative. Prognostic indicators significant for: estrogen receptor, 90% positive and progesterone receptor, 90% positive, both with strong staining intensity. Proliferation marker Ki67 at 5%. HER2 negative by immunohistochemistry (1+).  The patient's subsequent history is as detailed below.   PAST MEDICAL HISTORY: Past Medical History:  Diagnosis Date  . Bladder cancer ([place])    [place]  . Breast cancer ([place])   . DJD (degenerative joint disease)    NECK, NEEDS PILLOW WHEN LYING FLAT IS POSSIBLE  . History of cerebral aneurysm repair    1996-- S/P CLAPPING RIGHT CAROTID --- NO DEFICITS  . 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 Hyperlipidemia   . Hypertension   . Liver hemangioma   . OA (osteoarthritis)   . PAC (premature atrial contraction)   . PONV (postoperative nausea and vomiting)   . Right trigger finger   . Type 2 diabetes mellitus ([place])   . Wears glasses  PAST SURGICAL HISTORY: Past Surgical History:  Procedure Laterality Date  . ABDOMINAL HYSTERECTOMY  1990   PARTIAL  . BREAST LUMPECTOMY WITH RADIOACTIVE SEED AND SENTINEL LYMPH NODE BIOPSY Right 04/17/2020    Procedure: BREAST LUMPECTOMY X 2  WITH RADIOACTIVE SEED AND SENTINEL LYMPH NODE BIOPSY;  Surgeon: Coralie Keens, MD;  Location: [facility];  Service: General;  Laterality: Right;  . CATARACT EXTRACTION W/ INTRAOCULAR LENS  IMPLANT, BILATERAL  2016; 2017  . CEREBRAL ANEURYSM REPAIR  1996   clipped-no deficits RIGHT CAROTID  . CHOLECYSTECTOMY  2010  . CYSTOSCOPY W/ RETROGRADES Right 06/09/2017   Procedure: CYSTOSCOPY WITH RETROGRADE PYELOGRAM;  Surgeon: Alexis Frock, MD;  Location: [facility];  Service: Urology;  Laterality: Right;  . CYSTOSCOPY W/ RETROGRADES Right 07/21/2017   Procedure: CYSTOSCOPY WITH RETROGRADE PYELOGRAM;  Surgeon: Alexis Frock, MD;  Location: [facility];  Service: Urology;  Laterality: Right;  . CYSTOSCOPY WITH RETROGRADE PYELOGRAM, URETEROSCOPY AND STENT PLACEMENT Left 08/26/2016   Procedure: CYSTOSCOPY WITH BILATERAL RETROGRADE PYELOGRAM,LEFT URETEROSCOPY WITH BIOPSY AND LEFT STENT PLACEMENT;  Surgeon: Alexis Frock, MD;  Location: [facility];  Service: Urology;  Laterality: Left;  . PORTACATH PLACEMENT Left 06/18/2020   Procedure: INSERTION PORT-A-CATH WITH ULTRASOUND GUIDANCE;  Surgeon: Coralie Keens, MD;  Location: [facility];  Service: General;  Laterality: Left;  . ROBOT ASSITED LAPAROSCOPIC NEPHROURETERECTOMY Left 10/09/2016   Procedure: XI ROBOT ASSITED LAPAROSCOPIC NEPHROURETERECTOMY;  Surgeon: Alexis Frock, MD;  Location: [facility];  Service: Urology;  Laterality: Left;  . SURGERY ON FOOT FOR STAPH INFECTION  1989  . TRANSURETHRAL RESECTION OF BLADDER TUMOR N/A 07/21/2017   Procedure: TRANSURETHRAL RESECTION OF BLADDER TUMOR (TURBT);  Surgeon: Alexis Frock, MD;  Location: [facility];  Service: Urology;  Laterality: N/A;  . TRANSURETHRAL RESECTION OF BLADDER TUMOR WITH MITOMYCIN-C N/A 06/09/2017   Procedure: TRANSURETHRAL RESECTION OF BLADDER TUMOR WITH MITOMYCIN-C;  Surgeon: Alexis Frock, MD;   Location: [facility];  Service: Urology;  Laterality: N/A;  . TUBAL LIGATION  1980    FAMILY HISTORY: Family History  Problem Relation Age of Onset  . Cancer Mother   . Hyperlipidemia Father   . Heart attack Father   . Diabetes Brother   . Breast cancer Neg Hx    Her father died at age 59 from ASCVD. Her mother died at age 19. She reports her mother had a tumor in her thigh at age 32, possibly a sarcoma. The patient has one brother, with no history of cancer. Her son, [name], had brain cancer (oligodedroglioma, possibly) and is doing well now at age 22 (as of 03/2020).   GYNECOLOGIC HISTORY:  No LMP recorded. Patient has had a hysterectomy. Menarche: 69 years old Age at first live birth: 69 years old Opp P 3 LMP 1990 Contraceptive: used pills for 8 years without issues HRT never used  Hysterectomy? Yes, 1990 BSO? 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 Husband Liliane Channel is a retired Garment/textile technologist for the [employer]. She lives at home with husband Liliane Channel and their cat, [place]. Daughter Bryson Ha, age 58, works as a Careers adviser in [place]. Son [name], age 4, works in Human resources officer in [place]. Son Ruby Cola, age 57, lives in [place] and was previously a paramedic. Taffany has 4 grandchildren. She attends a [employer].     ADVANCED DIRECTIVES: In the absence of any documentation to the contrary, the patient's  spouse is their HCPOA.    HEALTH MAINTENANCE: Social History   Tobacco Use  . Smoking status: Never Smoker  . Smokeless tobacco: Never Used  Vaping Use  . Vaping Use: Never used  Substance Use Topics  . Alcohol use: Not Currently    Comment: , STOPPED  . Drug use: No     Colonoscopy: Cologuard 2020 was negative  PAP: date unknown, s/p hysterectomy  Bone density: 03/2018, +1.5   Allergies  Allergen Reactions  . Adhesive [Tape] Rash    PAPER TAPE ONLY  .  Chlorhexidine Gluconate Itching and Rash    Topical antiseptic (CHG WIPES)   . Dilantin [Phenytoin] Rash  . Penicillins Rash    Has patient had a PCN reaction causing immediate rash, facial/tongue/throat swelling, SOB or lightheadedness with hypotension:unsure Has patient had a PCN reaction causing severe rash involving mucus membranes or skin necrosis:unsure Has patient had a PCN reaction that required hospitalization:No Has patient had a PCN reaction occurring within the last 10 years:No If all of the above answers are "NO", then may proceed with Cephalosporin use.   . Sulfa Antibiotics Rash    Current Outpatient Medications  Medication Sig Dispense Refill  . acetaminophen (TYLENOL) 500 MG tablet Take 500 mg by mouth at bedtime.     Marland Kitchen anastrozole (ARIMIDEX) 1 MG tablet Take 1 tablet (1 mg total) by mouth daily. 90 tablet 4  . BD PEN NEEDLE NANO U/F 32G X 4 MM MISC USE FOR INSULIN ADMINISTRATION DAILY 100 each 3  . carvedilol (COREG) 6.25 MG tablet Take 1 tablet (6.25 mg total) by mouth 2 (two) times daily. 180 tablet 3  . cetirizine (ZYRTEC) 10 MG tablet Take 10 mg by mouth every morning.     Marland Kitchen HUMALOG KWIKPEN 100 UNIT/ML KwikPen Inject 5-6 Units into the skin 3 (three) times daily.     Marland Kitchen LANTUS SOLOSTAR 100 UNIT/ML Solostar Pen Inject 24- 26 units subcue daily for diabetes (Patient taking differently: Inject 16 Units into the skin every evening. ) 15 mL 6  . lidocaine-prilocaine (EMLA) cream Apply to affected area once 30 g 3  . ONETOUCH DELICA LANCETS 33G MISC Use lancet to stick finger and check blood sugar two times daily. 100 each 6  . ONETOUCH VERIO test strip USE AS DIRECTED. 300 each 2  . pravastatin (PRAVACHOL) 40 MG tablet Take 1 tablet (40 mg total) by mouth daily. 90 tablet 3  . prochlorperazine (COMPAZINE) 10 MG tablet Take 1 tablet (10 mg total) by mouth every 6 (six) hours as needed (Nausea or vomiting). 30 tablet 1   No current facility-administered medications for this  visit.    OBJECTIVE: White woman who appears stated age Vitals:   08/06/20 1315  BP: 138/63  Pulse: 73  Resp: 18  Temp: 97.6 F (36.4 C)  SpO2: 100%     Body mass index is 35.09 kg/m.   Wt Readings from Last 3 Encounters:  08/06/20 230 lb 12.8 oz (104.7 kg)  07/18/20 228 lb 6.4 oz (103.6 kg)  07/11/20 229 lb 6.4 oz (104.1 kg)      ECOG FS:1 - Symptomatic but completely ambulatory  Sclerae unicteric, EOMs intact Wearing a mask No cervical or supraclavicular adenopathy Lungs no rales or rhonchi Heart regular rate and rhythm Abd soft, nontender, positive bowel sounds MSK no focal spinal tenderness, no upper extremity lymphedema Neuro: nonfocal, well oriented, appropriate affect Breasts: Deferred   LAB RESULTS:  CMP     Component Value Date/Time  NA 138 07/18/2020 1030   NA 140 04/21/2019 0000   K 4.5 07/18/2020 1030   CL 106 07/18/2020 1030   CO2 24 07/18/2020 1030   GLUCOSE 157 (H) 07/18/2020 1030   BUN 30 (H) 07/18/2020 1030   BUN 36 (A) 08/30/2017 0000   CREATININE 1.70 (H) 07/18/2020 1030   CREATININE 1.78 (H) 06/19/2020 0842   CALCIUM 10.2 07/18/2020 1030   PROT 7.5 07/18/2020 1030   ALBUMIN 3.6 07/18/2020 1030   AST 26 07/18/2020 1030   AST 24 06/19/2020 0842   ALT 31 07/18/2020 1030   ALT 30 06/19/2020 0842   ALKPHOS 83 07/18/2020 1030   BILITOT 0.5 07/18/2020 1030   BILITOT 0.5 06/19/2020 0842   GFRNONAA 32 (L) 07/18/2020 1030   GFRNONAA 28 (L) 06/19/2020 0842   GFRAA 34 (L) 06/26/2020 1535   GFRAA 33 (L) 06/19/2020 0842    No results found for: TOTALPROTELP, ALBUMINELP, A1GS, A2GS, BETS, BETA2SER, GAMS, MSPIKE, SPEI  Lab Results  Component Value Date   WBC 14.1 (H) 08/06/2020   NEUTROABS 10.9 (H) 08/06/2020   HGB 14.1 08/06/2020   HCT 40.9 08/06/2020   MCV 90.5 08/06/2020   PLT 158 08/06/2020    No results found for: LABCA2  No components found for: [id number]  No results for input(s): INR in the last 168 hours.  No results found  for: LABCA2  No results found for: HOM052  No results found for: MCP660  No results found for: WCF901  No results found for: CA2729  No components found for: HGQUANT  No results found for: CEA1 / No results found for: CEA1   No results found for: AFPTUMOR  No results found for: CHROMOGRNA  No results found for: KPAFRELGTCHN, LAMBDASER, KAPLAMBRATIO (kappa/lambda light chains)  No results found for: HGBA, HGBA2QUANT, HGBFQUANT, HGBSQUAN (Hemoglobinopathy evaluation)   No results found for: LDH  No results found for: IRON, TIBC, IRONPCTSAT (Iron and TIBC)  No results found for: FERRITIN  Urinalysis    Component Value Date/Time   COLORURINE RED (A) 08/16/2016 0450   APPEARANCEUR CLOUDY (A) 08/16/2016 0450   LABSPEC 1.014 08/16/2016 0450   PHURINE 6.0 08/16/2016 0450   GLUCOSEU NEGATIVE 08/16/2016 0450   HGBUR LARGE (A) 08/16/2016 0450   BILIRUBINUR negative 06/27/2018 0934   KETONESUR negative 06/27/2018 0934   KETONESUR NEGATIVE 08/16/2016 0450   PROTEINUR negative 06/27/2018 0934   PROTEINUR 100 (A) 08/16/2016 0450   UROBILINOGEN negative (A) 06/27/2018 0934   UROBILINOGEN 1.0 04/29/2013 1455   NITRITE Negative 06/27/2018 0934   NITRITE NEGATIVE 08/16/2016 0450   LEUKOCYTESUR Small (1+) (A) 06/27/2018 0934    STUDIES: No results found.   ELIGIBLE FOR AVAILABLE RESEARCH PROTOCOL: AET  ASSESSMENT: 69 y.o. [place] woman status post right breast upper outer quadrant biopsy 03/26/2020, for a clinical T1c N0, stage IA invasive lobular carcinoma, E-cadherin negative, grade 2, estrogen and progesterone receptor positive, with an MIB-1 of 5% and no HER-2 amplification  (a) unable to obtain breast MRI due to renal concern with contrast status post nephrectomy  (1) status post right lumpectomy and sentinel lymph node sampling 04/17/2020 for a pT2 pN0, stage IB invasive lobular carcinoma, grade 2, with negative margins  (a) a single right axillary  lymph node was removed.  (2) Oncotype score of 29 predicts a risk of recurrence outside the breast within the next 9 years of 18% with antiestrogens only, and also a greater than 15% benefit from chemotherapy.  (3) chemotherapy  consisting of cyclophosphamide, methotrexate and fluorouracil given every 21 days x 8, started 06/19/2020  (a) Udenyca added a day 3 beginning with cycle 2 secondary to treatment delays due to neutropenia  (4) adjuvant radiation can be given concurrently with chemotherapy  (5) anastrozole started 04/03/2020 with anticipation of surgical delays, interrupted during chemotherapy   PLAN: Michelle is responding well to [facility] and she is going to start her cycle #3 of 8 today.  I have changed her schedule and since there have been some delays and hopefully we can get the remaining 5 treatments scheduled for her today.  She much prefers to have her Udenyca dose on day 3 rather than day 4 and that is also being arranged for.  I am obtaining a urinalysis today and a urine culture.  If the urinalysis is abnormal I will start her nitrofurantoin  She knows to call for any other issue that may develop before the next visit.  Total encounter time 30 minutes.Sarajane Jews C. Baylon Santelli, MD 08/06/2020 1:51 PM Medical Oncology and Hematology [facility] [place], [place] [address] Tel. [phone]    Fax. [phone]   This document serves as a record of services personally performed by Lurline Del, MD. It was created on his behalf by Wilburn Mylar, a trained medical scribe. The creation of this record is based on the scribe's personal observations and the provider's statements to them.   I, Lurline Del MD, have reviewed the above documentation for accuracy and completeness, and I agree with the above.   *Total Encounter Time as defined by the Centers for Medicare and Medicaid Services includes, in addition to the face-to-face time of a  patient visit (documented in the note above) non-face-to-face time: obtaining and reviewing outside history, ordering and reviewing medications, tests or procedures, care coordination (communications with other health care professionals or caregivers) and documentation in the medical record.

## 2020-08-06 ENCOUNTER — Inpatient Hospital Stay: Payer: Medicare Other

## 2020-08-06 ENCOUNTER — Other Ambulatory Visit: Payer: Self-pay

## 2020-08-06 ENCOUNTER — Ambulatory Visit: Payer: Medicare Other

## 2020-08-06 ENCOUNTER — Inpatient Hospital Stay (HOSPITAL_BASED_OUTPATIENT_CLINIC_OR_DEPARTMENT_OTHER): Payer: Medicare Other | Admitting: Oncology

## 2020-08-06 ENCOUNTER — Inpatient Hospital Stay: Payer: Medicare Other | Attending: Oncology

## 2020-08-06 VITALS — BP 138/63 | HR 73 | Temp 97.6°F | Resp 18 | Wt 230.8 lb

## 2020-08-06 DIAGNOSIS — C50411 Malignant neoplasm of upper-outer quadrant of right female breast: Secondary | ICD-10-CM | POA: Diagnosis not present

## 2020-08-06 DIAGNOSIS — E785 Hyperlipidemia, unspecified: Secondary | ICD-10-CM | POA: Insufficient documentation

## 2020-08-06 DIAGNOSIS — R5383 Other fatigue: Secondary | ICD-10-CM | POA: Insufficient documentation

## 2020-08-06 DIAGNOSIS — Z79899 Other long term (current) drug therapy: Secondary | ICD-10-CM | POA: Diagnosis not present

## 2020-08-06 DIAGNOSIS — I10 Essential (primary) hypertension: Secondary | ICD-10-CM | POA: Insufficient documentation

## 2020-08-06 DIAGNOSIS — C652 Malignant neoplasm of left renal pelvis: Secondary | ICD-10-CM

## 2020-08-06 DIAGNOSIS — Z95828 Presence of other vascular implants and grafts: Secondary | ICD-10-CM

## 2020-08-06 DIAGNOSIS — Z170 Estrogen receptor positive status [ER+]: Secondary | ICD-10-CM

## 2020-08-06 DIAGNOSIS — Z794 Long term (current) use of insulin: Secondary | ICD-10-CM | POA: Insufficient documentation

## 2020-08-06 DIAGNOSIS — E119 Type 2 diabetes mellitus without complications: Secondary | ICD-10-CM | POA: Diagnosis not present

## 2020-08-06 DIAGNOSIS — K5900 Constipation, unspecified: Secondary | ICD-10-CM | POA: Insufficient documentation

## 2020-08-06 DIAGNOSIS — Z79811 Long term (current) use of aromatase inhibitors: Secondary | ICD-10-CM | POA: Diagnosis not present

## 2020-08-06 DIAGNOSIS — Z5111 Encounter for antineoplastic chemotherapy: Secondary | ICD-10-CM | POA: Insufficient documentation

## 2020-08-06 DIAGNOSIS — E118 Type 2 diabetes mellitus with unspecified complications: Secondary | ICD-10-CM

## 2020-08-06 DIAGNOSIS — N184 Chronic kidney disease, stage 4 (severe): Secondary | ICD-10-CM

## 2020-08-06 DIAGNOSIS — M1990 Unspecified osteoarthritis, unspecified site: Secondary | ICD-10-CM | POA: Diagnosis not present

## 2020-08-06 LAB — CBC WITH DIFFERENTIAL/PLATELET
Abs Immature Granulocytes: 0.24 10*3/uL — ABNORMAL HIGH (ref 0.00–0.07)
Basophils Absolute: 0.1 10*3/uL (ref 0.0–0.1)
Basophils Relative: 1 %
Eosinophils Absolute: 0.3 10*3/uL (ref 0.0–0.5)
Eosinophils Relative: 2 %
HCT: 40.9 % (ref 36.0–46.0)
Hemoglobin: 14.1 g/dL (ref 12.0–15.0)
Immature Granulocytes: 2 %
Lymphocytes Relative: 12 %
Lymphs Abs: 1.7 10*3/uL (ref 0.7–4.0)
MCH: 31.2 pg (ref 26.0–34.0)
MCHC: 34.5 g/dL (ref 30.0–36.0)
MCV: 90.5 fL (ref 80.0–100.0)
Monocytes Absolute: 0.7 10*3/uL (ref 0.1–1.0)
Monocytes Relative: 5 %
Neutro Abs: 10.9 10*3/uL — ABNORMAL HIGH (ref 1.7–7.7)
Neutrophils Relative %: 78 %
Platelets: 158 10*3/uL (ref 150–400)
RBC: 4.52 MIL/uL (ref 3.87–5.11)
RDW: 14 % (ref 11.5–15.5)
WBC: 14.1 10*3/uL — ABNORMAL HIGH (ref 4.0–10.5)
nRBC: 0 % (ref 0.0–0.2)

## 2020-08-06 LAB — URINALYSIS, COMPLETE (UACMP) WITH MICROSCOPIC
Bilirubin Urine: NEGATIVE
Glucose, UA: NEGATIVE mg/dL
Ketones, ur: NEGATIVE mg/dL
Nitrite: NEGATIVE
Protein, ur: NEGATIVE mg/dL
Specific Gravity, Urine: 1.009 (ref 1.005–1.030)
WBC, UA: >50 WBC/hpf — ABNORMAL HIGH (ref 0–5)
pH: 6 (ref 5.0–8.0)

## 2020-08-06 LAB — COMPREHENSIVE METABOLIC PANEL
ALT: 36 U/L (ref 0–44)
AST: 24 U/L (ref 15–41)
Albumin: 3.5 g/dL (ref 3.5–5.0)
Alkaline Phosphatase: 113 U/L (ref 38–126)
Anion gap: 6 (ref 5–15)
BUN: 32 mg/dL — ABNORMAL HIGH (ref 8–23)
CO2: 27 mmol/L (ref 22–32)
Calcium: 9.6 mg/dL (ref 8.9–10.3)
Chloride: 105 mmol/L (ref 98–111)
Creatinine, Ser: 2.22 mg/dL — ABNORMAL HIGH (ref 0.44–1.00)
GFR, Estimated: 23 mL/min — ABNORMAL LOW (ref >60)
Glucose, Bld: 225 mg/dL — ABNORMAL HIGH (ref 70–99)
Potassium: 4.7 mmol/L (ref 3.5–5.1)
Sodium: 138 mmol/L (ref 135–145)
Total Bilirubin: 0.6 mg/dL (ref 0.3–1.2)
Total Protein: 7.2 g/dL (ref 6.5–8.1)

## 2020-08-06 MED ORDER — PALONOSETRON HCL INJECTION 0.25 MG/5ML
0.2500 mg | Freq: Once | INTRAVENOUS | Status: AC
  Administered 2020-08-06: 0.25 mg via INTRAVENOUS

## 2020-08-06 MED ORDER — FLUOROURACIL CHEMO INJECTION 2.5 GM/50ML
600.0000 mg/m2 | Freq: Once | INTRAVENOUS | Status: AC
  Administered 2020-08-06: 1350 mg via INTRAVENOUS
  Filled 2020-08-06: qty 27

## 2020-08-06 MED ORDER — SODIUM CHLORIDE 0.9% FLUSH
10.0000 mL | INTRAVENOUS | Status: DC | PRN
  Administered 2020-08-06: 10 mL
  Filled 2020-08-06: qty 10

## 2020-08-06 MED ORDER — SODIUM CHLORIDE 0.9 % IV SOLN
150.0000 mg | Freq: Once | INTRAVENOUS | Status: AC
  Administered 2020-08-06: 150 mg via INTRAVENOUS
  Filled 2020-08-06: qty 150

## 2020-08-06 MED ORDER — SODIUM CHLORIDE 0.9 % IV SOLN
500.0000 mg/m2 | Freq: Once | INTRAVENOUS | Status: AC
  Administered 2020-08-06: 1120 mg via INTRAVENOUS
  Filled 2020-08-06: qty 56

## 2020-08-06 MED ORDER — PROCHLORPERAZINE EDISYLATE 10 MG/2ML IJ SOLN
INTRAMUSCULAR | Status: AC
  Filled 2020-08-06: qty 2

## 2020-08-06 MED ORDER — PALONOSETRON HCL INJECTION 0.25 MG/5ML
INTRAVENOUS | Status: AC
  Filled 2020-08-06: qty 5

## 2020-08-06 MED ORDER — HEPARIN SOD (PORK) LOCK FLUSH 100 UNIT/ML IV SOLN
500.0000 [IU] | Freq: Once | INTRAVENOUS | Status: AC | PRN
  Administered 2020-08-06: 500 [IU]
  Filled 2020-08-06: qty 5

## 2020-08-06 MED ORDER — SODIUM CHLORIDE 0.9 % IV SOLN
Freq: Once | INTRAVENOUS | Status: AC
  Filled 2020-08-06: qty 250

## 2020-08-06 MED ORDER — ALTEPLASE 2 MG IJ SOLR
INTRAMUSCULAR | Status: AC
  Filled 2020-08-06: qty 2

## 2020-08-06 MED ORDER — PROCHLORPERAZINE EDISYLATE 10 MG/2ML IJ SOLN
10.0000 mg | Freq: Once | INTRAMUSCULAR | Status: AC
  Administered 2020-08-06: 10 mg via INTRAVENOUS

## 2020-08-06 MED ORDER — METHOTREXATE SODIUM (PF) CHEMO INJECTION 250 MG/10ML
20.2000 mg/m2 | Freq: Once | INTRAMUSCULAR | Status: AC
  Administered 2020-08-06: 45 mg via INTRAVENOUS
  Filled 2020-08-06: qty 1.8

## 2020-08-06 MED ORDER — ALTEPLASE 2 MG IJ SOLR
2.0000 mg | Freq: Once | INTRAMUSCULAR | Status: AC | PRN
  Administered 2020-08-06: 2 mg
  Filled 2020-08-06: qty 2

## 2020-08-06 NOTE — Patient Instructions (Signed)
[place] Discharge Instructions for Patients Receiving Chemotherapy  Today you received the following chemotherapy agents: Cytoxan/Methotrexate/5FU.  To help prevent nausea and vomiting after your treatment, we encourage you to take your nausea medication as directed.   If you develop nausea and vomiting that is not controlled by your nausea medication, call the clinic.   BELOW ARE SYMPTOMS THAT SHOULD BE REPORTED IMMEDIATELY:  *FEVER GREATER THAN 100.5 F  *CHILLS WITH OR WITHOUT FEVER  NAUSEA AND VOMITING THAT IS NOT CONTROLLED WITH YOUR NAUSEA MEDICATION  *UNUSUAL SHORTNESS OF BREATH  *UNUSUAL BRUISING OR BLEEDING  TENDERNESS IN MOUTH AND THROAT WITH OR WITHOUT PRESENCE OF ULCERS  *URINARY PROBLEMS  *BOWEL PROBLEMS  UNUSUAL RASH Items with * indicate a potential emergency and should be followed up as soon as possible.  Feel free to call the clinic should you have any questions or concerns. The clinic phone number is [phone].  Please show the [place] at check-in to the Emergency Department and triage nurse.

## 2020-08-06 NOTE — Progress Notes (Signed)
Per Dr Jana Hakim, ok to treat with creatinine 2.22

## 2020-08-06 NOTE — Progress Notes (Signed)
Patient scr today is 2.22 with calculated CrCl of 38.4 ml/min.  Contacted Dr Jana Hakim to see if he would like to further reduce methotrexate dose due to manufacturer recommendations.  Received order to decrease methotrexate dose to 20 mg/m2.  Order updated to reflect the change.  T.O. Dr Magrinat/Lamount Bankson Ronnald Ramp, PharmD

## 2020-08-06 NOTE — Progress Notes (Signed)
No blood return from port. Blood drawn peripherally.  Cathflo administered by Laurence Compton RN

## 2020-08-07 ENCOUNTER — Other Ambulatory Visit: Payer: Self-pay | Admitting: *Deleted

## 2020-08-07 ENCOUNTER — Other Ambulatory Visit: Payer: Self-pay

## 2020-08-07 DIAGNOSIS — C50411 Malignant neoplasm of upper-outer quadrant of right female breast: Secondary | ICD-10-CM

## 2020-08-07 DIAGNOSIS — Z170 Estrogen receptor positive status [ER+]: Secondary | ICD-10-CM

## 2020-08-07 MED ORDER — CIPROFLOXACIN HCL 500 MG PO TABS: 500.0000 mg | ORAL_TABLET | Freq: Two times a day (BID) | ORAL | 0 refills | Status: AC

## 2020-08-07 NOTE — Progress Notes (Signed)
Per Wilber Bihari, NP, called to make pt aware of positive results for UTI and ABT being sent to pharmacy for pick up.Pt not available. Advised on vm to call facility.

## 2020-08-07 NOTE — Telephone Encounter (Signed)
This LPN called pt to inform her of UTI and to make her aware, per Dr Jana Hakim we called in Cipro 500 mg 1 tab PO BID X 5 days, disp 10, no refills. Pt understands she should drink plenty of fluids. Pt verbalized thanks and understanding.

## 2020-08-08 ENCOUNTER — Telehealth: Payer: Self-pay | Admitting: Oncology

## 2020-08-08 ENCOUNTER — Other Ambulatory Visit: Payer: Self-pay | Admitting: Oncology

## 2020-08-08 NOTE — Telephone Encounter (Signed)
Scheduled appointments per 11/9 los. Spoke to patient who is aware of appointments dates and times.  

## 2020-08-09 ENCOUNTER — Other Ambulatory Visit: Payer: Self-pay

## 2020-08-09 ENCOUNTER — Inpatient Hospital Stay: Payer: Medicare Other

## 2020-08-09 VITALS — BP 140/61 | HR 67 | Resp 18

## 2020-08-09 DIAGNOSIS — C50411 Malignant neoplasm of upper-outer quadrant of right female breast: Secondary | ICD-10-CM | POA: Diagnosis not present

## 2020-08-09 DIAGNOSIS — N184 Chronic kidney disease, stage 4 (severe): Secondary | ICD-10-CM

## 2020-08-09 DIAGNOSIS — Z794 Long term (current) use of insulin: Secondary | ICD-10-CM

## 2020-08-09 LAB — URINE CULTURE: Culture: >=100000 — AB

## 2020-08-09 MED ORDER — PEGFILGRASTIM-CBQV 6 MG/0.6ML ~~LOC~~ SOSY
6.0000 mg | PREFILLED_SYRINGE | Freq: Once | SUBCUTANEOUS | Status: AC
  Administered 2020-08-09: 6 mg via SUBCUTANEOUS

## 2020-08-09 MED ORDER — PEGFILGRASTIM-CBQV 6 MG/0.6ML ~~LOC~~ SOSY
PREFILLED_SYRINGE | SUBCUTANEOUS | Status: AC
  Filled 2020-08-09: qty 0.6

## 2020-08-09 NOTE — Patient Instructions (Signed)

## 2020-08-12 ENCOUNTER — Telehealth: Payer: Self-pay

## 2020-08-12 NOTE — Telephone Encounter (Signed)
Called and given below message. She verbalized understanding. She finished the antibiotics yesterday and feels good. Denies any urinary symptoms.

## 2020-08-12 NOTE — Telephone Encounter (Signed)
-----   Message from Gardenia Phlegm, NP sent at 08/12/2020 12:19 PM EST ----- Urine is growing E coli.  Is she on antibiotics, how is she doing? ----- Message ----- From: Interface, Lab In [place] Sent: 08/06/2020   3:10 PM EST To: Chauncey Cruel, MD

## 2020-08-21 ENCOUNTER — Other Ambulatory Visit: Payer: Medicare Other

## 2020-08-21 ENCOUNTER — Ambulatory Visit: Payer: Medicare Other

## 2020-08-23 ENCOUNTER — Other Ambulatory Visit: Payer: Medicare Other

## 2020-08-23 ENCOUNTER — Ambulatory Visit: Payer: Medicare Other | Admitting: Adult Health

## 2020-08-23 ENCOUNTER — Ambulatory Visit: Payer: Medicare Other

## 2020-08-27 ENCOUNTER — Inpatient Hospital Stay: Payer: Medicare Other

## 2020-08-27 ENCOUNTER — Other Ambulatory Visit: Payer: Medicare Other

## 2020-08-27 ENCOUNTER — Other Ambulatory Visit: Payer: Self-pay

## 2020-08-27 ENCOUNTER — Inpatient Hospital Stay (HOSPITAL_BASED_OUTPATIENT_CLINIC_OR_DEPARTMENT_OTHER): Payer: Medicare Other | Admitting: Nurse Practitioner

## 2020-08-27 ENCOUNTER — Ambulatory Visit: Payer: Medicare Other | Admitting: Physician Assistant

## 2020-08-27 ENCOUNTER — Encounter: Payer: Self-pay | Admitting: Nurse Practitioner

## 2020-08-27 VITALS — BP 137/77 | HR 67 | Temp 97.5°F | Resp 18 | Ht 68.0 in | Wt 227.4 lb

## 2020-08-27 DIAGNOSIS — C50411 Malignant neoplasm of upper-outer quadrant of right female breast: Secondary | ICD-10-CM

## 2020-08-27 DIAGNOSIS — Z170 Estrogen receptor positive status [ER+]: Secondary | ICD-10-CM

## 2020-08-27 DIAGNOSIS — Z794 Long term (current) use of insulin: Secondary | ICD-10-CM

## 2020-08-27 DIAGNOSIS — E118 Type 2 diabetes mellitus with unspecified complications: Secondary | ICD-10-CM

## 2020-08-27 DIAGNOSIS — Z95828 Presence of other vascular implants and grafts: Secondary | ICD-10-CM

## 2020-08-27 DIAGNOSIS — N184 Chronic kidney disease, stage 4 (severe): Secondary | ICD-10-CM

## 2020-08-27 DIAGNOSIS — C652 Malignant neoplasm of left renal pelvis: Secondary | ICD-10-CM

## 2020-08-27 LAB — CBC WITH DIFFERENTIAL/PLATELET
Abs Immature Granulocytes: 0.11 10*3/uL — ABNORMAL HIGH (ref 0.00–0.07)
Basophils Absolute: 0.1 10*3/uL (ref 0.0–0.1)
Basophils Relative: 1 %
Eosinophils Absolute: 0.2 10*3/uL (ref 0.0–0.5)
Eosinophils Relative: 2 %
HCT: 40 % (ref 36.0–46.0)
Hemoglobin: 13.5 g/dL (ref 12.0–15.0)
Immature Granulocytes: 1 %
Lymphocytes Relative: 11 %
Lymphs Abs: 1.1 10*3/uL (ref 0.7–4.0)
MCH: 31.7 pg (ref 26.0–34.0)
MCHC: 33.8 g/dL (ref 30.0–36.0)
MCV: 93.9 fL (ref 80.0–100.0)
Monocytes Absolute: 0.8 10*3/uL (ref 0.1–1.0)
Monocytes Relative: 7 %
Neutro Abs: 8 10*3/uL — ABNORMAL HIGH (ref 1.7–7.7)
Neutrophils Relative %: 78 %
Platelets: 138 10*3/uL — ABNORMAL LOW (ref 150–400)
RBC: 4.26 MIL/uL (ref 3.87–5.11)
RDW: 16.3 % — ABNORMAL HIGH (ref 11.5–15.5)
WBC: 10.3 10*3/uL (ref 4.0–10.5)
nRBC: 0 % (ref 0.0–0.2)

## 2020-08-27 LAB — COMPREHENSIVE METABOLIC PANEL
ALT: 32 U/L (ref 0–44)
AST: 29 U/L (ref 15–41)
Albumin: 3.5 g/dL (ref 3.5–5.0)
Alkaline Phosphatase: 105 U/L (ref 38–126)
Anion gap: 8 (ref 5–15)
BUN: 32 mg/dL — ABNORMAL HIGH (ref 8–23)
CO2: 21 mmol/L — ABNORMAL LOW (ref 22–32)
Calcium: 9.6 mg/dL (ref 8.9–10.3)
Chloride: 107 mmol/L (ref 98–111)
Creatinine, Ser: 1.83 mg/dL — ABNORMAL HIGH (ref 0.44–1.00)
GFR, Estimated: 29 mL/min — ABNORMAL LOW (ref >60)
Glucose, Bld: 193 mg/dL — ABNORMAL HIGH (ref 70–99)
Potassium: 4.8 mmol/L (ref 3.5–5.1)
Sodium: 136 mmol/L (ref 135–145)
Total Bilirubin: 0.6 mg/dL (ref 0.3–1.2)
Total Protein: 7 g/dL (ref 6.5–8.1)

## 2020-08-27 MED ORDER — ALTEPLASE 2 MG IJ SOLR
INTRAMUSCULAR | Status: AC
  Filled 2020-08-27: qty 2

## 2020-08-27 MED ORDER — SODIUM CHLORIDE 0.9% FLUSH
10.0000 mL | INTRAVENOUS | Status: DC | PRN
  Administered 2020-08-27: 10 mL
  Filled 2020-08-27: qty 10

## 2020-08-27 MED ORDER — SODIUM CHLORIDE 0.9 % IV SOLN
150.0000 mg | Freq: Once | INTRAVENOUS | Status: AC
  Administered 2020-08-27: 150 mg via INTRAVENOUS
  Filled 2020-08-27: qty 150

## 2020-08-27 MED ORDER — SODIUM CHLORIDE 0.9 % IV SOLN
Freq: Once | INTRAVENOUS | Status: AC
  Filled 2020-08-27: qty 250

## 2020-08-27 MED ORDER — HEPARIN SOD (PORK) LOCK FLUSH 100 UNIT/ML IV SOLN
500.0000 [IU] | Freq: Once | INTRAVENOUS | Status: AC | PRN
  Administered 2020-08-27: 500 [IU]
  Filled 2020-08-27: qty 5

## 2020-08-27 MED ORDER — PROCHLORPERAZINE EDISYLATE 10 MG/2ML IJ SOLN
INTRAMUSCULAR | Status: AC
  Filled 2020-08-27: qty 2

## 2020-08-27 MED ORDER — PALONOSETRON HCL INJECTION 0.25 MG/5ML
0.2500 mg | Freq: Once | INTRAVENOUS | Status: AC
  Administered 2020-08-27: 0.25 mg via INTRAVENOUS

## 2020-08-27 MED ORDER — PROCHLORPERAZINE EDISYLATE 10 MG/2ML IJ SOLN
10.0000 mg | Freq: Once | INTRAMUSCULAR | Status: AC
  Administered 2020-08-27: 10 mg via INTRAVENOUS

## 2020-08-27 MED ORDER — PALONOSETRON HCL INJECTION 0.25 MG/5ML
INTRAVENOUS | Status: AC
  Filled 2020-08-27: qty 5

## 2020-08-27 MED ORDER — METHOTREXATE SODIUM (PF) CHEMO INJECTION 250 MG/10ML
30.0000 mg/m2 | Freq: Once | INTRAMUSCULAR | Status: AC
  Administered 2020-08-27: 67 mg via INTRAVENOUS
  Filled 2020-08-27: qty 2.68

## 2020-08-27 MED ORDER — FLUOROURACIL CHEMO INJECTION 2.5 GM/50ML
600.0000 mg/m2 | Freq: Once | INTRAVENOUS | Status: AC
  Administered 2020-08-27: 1350 mg via INTRAVENOUS
  Filled 2020-08-27: qty 27

## 2020-08-27 MED ORDER — ALTEPLASE 2 MG IJ SOLR
2.0000 mg | Freq: Once | INTRAMUSCULAR | Status: AC | PRN
  Administered 2020-08-27: 2 mg
  Filled 2020-08-27: qty 2

## 2020-08-27 MED ORDER — SODIUM CHLORIDE 0.9 % IV SOLN
500.0000 mg/m2 | Freq: Once | INTRAVENOUS | Status: AC
  Administered 2020-08-27: 1120 mg via INTRAVENOUS
  Filled 2020-08-27: qty 56

## 2020-08-27 NOTE — Progress Notes (Signed)
Patient stable upon leaving unit. 

## 2020-08-27 NOTE — Progress Notes (Signed)
No blood return from port. Labs drawn peripherally.  Cathflo administered by [place] RN

## 2020-08-27 NOTE — Progress Notes (Signed)
Per Cira Rue, NP, patient is ok to treat with Scr 1.83.

## 2020-08-27 NOTE — Patient Instructions (Signed)
[place] Discharge Instructions for Patients Receiving Chemotherapy  Today you received the following chemotherapy agents: Cytoxan/Methotrexate/5FU.  To help prevent nausea and vomiting after your treatment, we encourage you to take your nausea medication as directed.   If you develop nausea and vomiting that is not controlled by your nausea medication, call the clinic.   BELOW ARE SYMPTOMS THAT SHOULD BE REPORTED IMMEDIATELY:  *FEVER GREATER THAN 100.5 F  *CHILLS WITH OR WITHOUT FEVER  NAUSEA AND VOMITING THAT IS NOT CONTROLLED WITH YOUR NAUSEA MEDICATION  *UNUSUAL SHORTNESS OF BREATH  *UNUSUAL BRUISING OR BLEEDING  TENDERNESS IN MOUTH AND THROAT WITH OR WITHOUT PRESENCE OF ULCERS  *URINARY PROBLEMS  *BOWEL PROBLEMS  UNUSUAL RASH Items with * indicate a potential emergency and should be followed up as soon as possible.  Feel free to call the clinic should you have any questions or concerns. The clinic phone number is [phone].  Please show the [place] at check-in to the Emergency Department and triage nurse.

## 2020-08-27 NOTE — Progress Notes (Signed)
[facility]   [phone] [phone]   Clinic Follow up Note   Patient Care Team: Copland, Gay Filler, MD as PCP - General (Family Medicine) Alexis Frock, MD as Consulting Physician (Urology) Rockwell Germany, RN as Oncology Nurse Navigator Mauro Kaufmann, RN as Oncology Nurse Navigator Coralie Keens, MD as Consulting Physician (General Surgery) Magrinat, Virgie Dad, MD as Consulting Physician (Oncology) Kyung Rudd, MD as Consulting Physician (Radiation Oncology) Madelin Rear, MD as Consulting Physician (Endocrinology) Madelon Lips, MD as Consulting Physician (Nephrology) 08/27/2020  CHIEF COMPLAINT: Follow-up right breast cancer  CURRENT THERAPY: Adjuvant chemotherapy, CMF q. 21 days x8 cycles   INTERVAL HISTORY: Tasha Tucker returns for follow-up and treatment as scheduled.  She received cycle 3 CMF on 11/9 and Udenyca on 11/12. She has mild fatigue and constipation for 2 days after treatment. Remains functional. Nose runs constantly even prior to chemo. Denies epistaxis. She had a small sore on the left side of her tongue that resolved, did not limit p.o. intake. Denies nausea/vomiting, constipation/diarrhea, fever, chills, cough, chest pain, dyspnea, rash, concerns in her breasts/incision, or other new issues.   MEDICAL HISTORY:  Past Medical History:  Diagnosis Date  . Bladder cancer ([place])    [place]  . Breast cancer ([place])   . DJD (degenerative joint disease)    NECK, NEEDS PILLOW WHEN LYING FLAT IS POSSIBLE  . History of cerebral aneurysm repair    1996-- S/P CLAPPING RIGHT CAROTID --- NO DEFICITS  . 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 Hyperlipidemia   . Hypertension   . Liver hemangioma   . OA (osteoarthritis)   . PAC (premature atrial contraction)   . PONV (postoperative nausea and vomiting)   . Right trigger  finger   . Type 2 diabetes mellitus ([place])   . Wears glasses     SURGICAL HISTORY: Past Surgical History:  Procedure Laterality Date  . ABDOMINAL HYSTERECTOMY  1990   PARTIAL  . BREAST LUMPECTOMY WITH RADIOACTIVE SEED AND SENTINEL LYMPH NODE BIOPSY Right 04/17/2020   Procedure: BREAST LUMPECTOMY X 2  WITH RADIOACTIVE SEED AND SENTINEL LYMPH NODE BIOPSY;  Surgeon: Coralie Keens, MD;  Location: [facility];  Service: General;  Laterality: Right;  . CATARACT EXTRACTION W/ INTRAOCULAR LENS  IMPLANT, BILATERAL  2016; 2017  . CEREBRAL ANEURYSM REPAIR  1996   clipped-no deficits RIGHT CAROTID  . CHOLECYSTECTOMY  2010  . CYSTOSCOPY W/ RETROGRADES Right 06/09/2017   Procedure: CYSTOSCOPY WITH RETROGRADE PYELOGRAM;  Surgeon: Alexis Frock, MD;  Location: [facility];  Service: Urology;  Laterality: Right;  . CYSTOSCOPY W/ RETROGRADES Right 07/21/2017   Procedure: CYSTOSCOPY WITH RETROGRADE PYELOGRAM;  Surgeon: Alexis Frock, MD;  Location: [facility];  Service: Urology;  Laterality: Right;  . CYSTOSCOPY WITH RETROGRADE PYELOGRAM, URETEROSCOPY AND STENT PLACEMENT Left 08/26/2016   Procedure: CYSTOSCOPY WITH BILATERAL RETROGRADE PYELOGRAM,LEFT URETEROSCOPY WITH BIOPSY AND LEFT STENT PLACEMENT;  Surgeon: Alexis Frock, MD;  Location: [facility];  Service: Urology;  Laterality: Left;  . PORTACATH PLACEMENT Left 06/18/2020   Procedure: INSERTION PORT-A-CATH WITH ULTRASOUND GUIDANCE;  Surgeon: Coralie Keens, MD;  Location: [facility];  Service: General;  Laterality: Left;  . ROBOT ASSITED LAPAROSCOPIC NEPHROURETERECTOMY Left 10/09/2016   Procedure: XI ROBOT ASSITED LAPAROSCOPIC NEPHROURETERECTOMY;  Surgeon: Alexis Frock, MD;  Location: [facility];  Service: Urology;  Laterality: Left;  . SURGERY  ON FOOT FOR STAPH INFECTION  1989  . TRANSURETHRAL RESECTION OF BLADDER TUMOR N/A 07/21/2017   Procedure: TRANSURETHRAL RESECTION OF BLADDER TUMOR (TURBT);  Surgeon:  Alexis Frock, MD;  Location: [facility];  Service: Urology;  Laterality: N/A;  . TRANSURETHRAL RESECTION OF BLADDER TUMOR WITH MITOMYCIN-C N/A 06/09/2017   Procedure: TRANSURETHRAL RESECTION OF BLADDER TUMOR WITH MITOMYCIN-C;  Surgeon: Alexis Frock, MD;  Location: [facility];  Service: Urology;  Laterality: N/A;  . TUBAL LIGATION  1980    I have reviewed the social history and family history with the patient and they are unchanged from previous note.  ALLERGIES:  is allergic to adhesive [tape], chlorhexidine gluconate, dilantin [phenytoin], penicillins, and sulfa antibiotics.  MEDICATIONS:  Current Outpatient Medications  Medication Sig Dispense Refill  . acetaminophen (TYLENOL) 500 MG tablet Take 500 mg by mouth at bedtime.     Marland Kitchen anastrozole (ARIMIDEX) 1 MG tablet Take 1 tablet (1 mg total) by mouth daily. 90 tablet 4  . BD PEN NEEDLE NANO U/F 32G X 4 MM MISC USE FOR INSULIN ADMINISTRATION DAILY 100 each 3  . carvedilol (COREG) 6.25 MG tablet Take 1 tablet (6.25 mg total) by mouth 2 (two) times daily. 180 tablet 3  . cetirizine (ZYRTEC) 10 MG tablet Take 10 mg by mouth every morning.     Marland Kitchen HUMALOG KWIKPEN 100 UNIT/ML KwikPen Inject 5-6 Units into the skin 3 (three) times daily.     Marland Kitchen LANTUS SOLOSTAR 100 UNIT/ML Solostar Pen Inject 24- 26 units subcue daily for diabetes (Patient taking differently: Inject 16 Units into the skin every evening. ) 15 mL 6  . lidocaine-prilocaine (EMLA) cream Apply to affected area once 30 g 3  . ONETOUCH DELICA LANCETS 32Z MISC Use lancet to stick finger and check blood sugar two times daily. 100 each 6  . ONETOUCH VERIO test strip USE AS DIRECTED. 300 each 2  . pravastatin (PRAVACHOL) 40 MG tablet Take 1 tablet (40 mg total) by mouth daily. 90 tablet 3  . prochlorperazine (COMPAZINE) 10 MG tablet Take 1 tablet (10 mg total) by mouth every 6 (six) hours as needed (Nausea or vomiting). 30 tablet 1   No current  facility-administered medications for this visit.   Facility-Administered Medications Ordered in Other Visits  Medication Dose Route Frequency Provider Last Rate Last Admin  . cyclophosphamide (CYTOXAN) 1,120 mg in sodium chloride 0.9 % 250 mL chemo infusion  500 mg/m2 (Treatment Plan Recorded) Intravenous Once Chauncey Cruel, MD 612 mL/hr at 08/27/20 1359 1,120 mg at 08/27/20 1359  . fluorouracil (ADRUCIL) chemo injection 1,350 mg  600 mg/m2 (Treatment Plan Recorded) Intravenous Once Magrinat, Virgie Dad, MD      . heparin lock flush 100 unit/mL  500 Units Intracatheter Once PRN Magrinat, Virgie Dad, MD      . methotrexate (PF) chemo injection 67 mg  30 mg/m2 (Treatment Plan Recorded) Intravenous Once Magrinat, Virgie Dad, MD      . sodium chloride flush (NS) 0.9 % injection 10 mL  10 mL Intracatheter PRN Magrinat, Virgie Dad, MD        PHYSICAL EXAMINATION: ECOG PERFORMANCE STATUS: 1 - Symptomatic but completely ambulatory    Vitals:   08/27/20 1211  BP: 137/77  Pulse: 67  Resp: 18  Temp: (!) 97.5 F (36.4 C)  SpO2: 100%   Filed Weights   08/27/20 1211  Weight: 227 lb 6.4 oz (103.1 kg)    GENERAL:alert, no distress and comfortable SKIN: no  rash  EYES:  sclera clear OROPHARYNX: no thrush or ulcers LUNGS: clear with normal breathing effort HEART: regular rate & rhythm, no lower extremity edema ABDOMEN: abdomen soft, non-tender and normal bowel sounds NEURO: alert & oriented x 3 with fluent speech, no focal motor/sensory deficits PAC without erythema  Breast exam deferred   LABORATORY DATA:  I have reviewed the data as listed CBC Latest Ref Rng & Units 08/27/2020 08/06/2020 07/18/2020  WBC 4.0 - 10.5 K/uL 10.3 14.1(H) 6.6  Hemoglobin 12.0 - 15.0 g/dL 13.5 14.1 15.6(H)  Hematocrit 36 - 46 % 40.0 40.9 45.4  Platelets 150 - 400 K/uL 138(L) 158 146(L)     CMP Latest Ref Rng & Units 08/27/2020 08/06/2020 07/18/2020  Glucose 70 - 99 mg/dL 193(H) 225(H) 157(H)  BUN 8 - 23 mg/dL  32(H) 32(H) 30(H)  Creatinine 0.44 - 1.00 mg/dL 1.83(H) 2.22(H) 1.70(H)  Sodium 135 - 145 mmol/L 136 138 138  Potassium 3.5 - 5.1 mmol/L 4.8 4.7 4.5  Chloride 98 - 111 mmol/L 107 105 106  CO2 22 - 32 mmol/L 21(L) 27 24  Calcium 8.9 - 10.3 mg/dL 9.6 9.6 10.2  Total Protein 6.5 - 8.1 g/dL 7.0 7.2 7.5  Total Bilirubin 0.3 - 1.2 mg/dL 0.6 0.6 0.5  Alkaline Phos 38 - 126 U/L 105 113 83  AST 15 - 41 U/L _0 ALT 0 - 44 U/L 32 36 31      RADIOGRAPHIC STUDIES: I have personally reviewed the radiological images as listed and agreed with the findings in the report. No results found.   ASSESSMENT & PLAN: 69 year old postmenopausal female  1.  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 21 days on 06/19/2020, Udenyca added with cycle 2.  Plan for 8 cycles -The treatment plan includes adjuvant radiation and anastrozole  Disposition: Ms. Kipper appears stable.  She completed 3 cycles of adjuvant CMF.  She tolerates treatment very well with mild fatigue and constipation for 2 days.  Side effects are well managed with supportive care at home.  She is able to recover and function well.  We reviewed the CBC and CMP from today.  Renal function improved, she completed treatment for UTI.  Labs adequate to proceed with cycle 4 CMF today as planned, with Udenyca on 11/3.  F/u in 3 weeks before cycle 5.   All questions were answered. The patient knows to call the clinic with any problems, questions or concerns. No barriers to learning were detected.     Alla Feeling, NP 08/27/20

## 2020-08-28 ENCOUNTER — Telehealth: Payer: Self-pay | Admitting: Nurse Practitioner

## 2020-08-28 NOTE — Telephone Encounter (Signed)
Scheduled appointments per 11/30 los. Will have updated calendar printed for patient at next visit.  

## 2020-08-30 ENCOUNTER — Inpatient Hospital Stay: Payer: Medicare Other | Attending: Oncology

## 2020-08-30 ENCOUNTER — Other Ambulatory Visit: Payer: Self-pay

## 2020-08-30 VITALS — BP 127/53 | HR 65 | Temp 98.5°F | Resp 18

## 2020-08-30 DIAGNOSIS — Z5189 Encounter for other specified aftercare: Secondary | ICD-10-CM | POA: Insufficient documentation

## 2020-08-30 DIAGNOSIS — E785 Hyperlipidemia, unspecified: Secondary | ICD-10-CM | POA: Insufficient documentation

## 2020-08-30 DIAGNOSIS — Z170 Estrogen receptor positive status [ER+]: Secondary | ICD-10-CM | POA: Diagnosis not present

## 2020-08-30 DIAGNOSIS — M1990 Unspecified osteoarthritis, unspecified site: Secondary | ICD-10-CM | POA: Diagnosis not present

## 2020-08-30 DIAGNOSIS — Z5111 Encounter for antineoplastic chemotherapy: Secondary | ICD-10-CM | POA: Insufficient documentation

## 2020-08-30 DIAGNOSIS — Z79899 Other long term (current) drug therapy: Secondary | ICD-10-CM | POA: Diagnosis not present

## 2020-08-30 DIAGNOSIS — E118 Type 2 diabetes mellitus with unspecified complications: Secondary | ICD-10-CM

## 2020-08-30 DIAGNOSIS — Z79811 Long term (current) use of aromatase inhibitors: Secondary | ICD-10-CM | POA: Diagnosis not present

## 2020-08-30 DIAGNOSIS — I10 Essential (primary) hypertension: Secondary | ICD-10-CM | POA: Insufficient documentation

## 2020-08-30 DIAGNOSIS — C50411 Malignant neoplasm of upper-outer quadrant of right female breast: Secondary | ICD-10-CM | POA: Insufficient documentation

## 2020-08-30 DIAGNOSIS — N184 Chronic kidney disease, stage 4 (severe): Secondary | ICD-10-CM

## 2020-08-30 DIAGNOSIS — E119 Type 2 diabetes mellitus without complications: Secondary | ICD-10-CM | POA: Insufficient documentation

## 2020-08-30 MED ORDER — PEGFILGRASTIM-CBQV 6 MG/0.6ML ~~LOC~~ SOSY
PREFILLED_SYRINGE | SUBCUTANEOUS | Status: AC
  Filled 2020-08-30: qty 0.6

## 2020-08-30 MED ORDER — PEGFILGRASTIM-CBQV 6 MG/0.6ML ~~LOC~~ SOSY
6.0000 mg | PREFILLED_SYRINGE | Freq: Once | SUBCUTANEOUS | Status: AC
  Administered 2020-08-30: 6 mg via SUBCUTANEOUS

## 2020-08-30 NOTE — Patient Instructions (Addendum)

## 2020-09-16 ENCOUNTER — Encounter: Payer: Self-pay | Admitting: *Deleted

## 2020-09-16 NOTE — Progress Notes (Signed)
[place]  [phone] [phone]     ID: Tasha Tucker DOB: 1951-05-11  MR#: [id number]  KGY#:[id number]  Patient Care Team: Darreld Mclean, MD as PCP - General (Family Medicine) Alexis Frock, MD as Consulting Physician (Urology) Rockwell Germany, RN as Oncology Nurse Navigator Mauro Kaufmann, RN as Oncology Nurse Navigator Coralie Keens, MD as Consulting Physician (General Surgery) Tonye Tancredi, Virgie Dad, MD as Consulting Physician (Oncology) Kyung Rudd, MD as Consulting Physician (Radiation Oncology) Madelin Rear, MD as Consulting Physician (Endocrinology) Madelon Lips, MD as Consulting Physician (Nephrology) Chauncey Cruel, MD OTHER MD:  CHIEF COMPLAINT: Estrogen receptor positive breast cancer  CURRENT TREATMENT: Adjuvant chemotherapy    INTERVAL HISTORY: [name] returns today for follow up and treatment of her estrogen receptor positive breast cancer.   She continues on adjuvant chemotherapy, consisting of cyclophosphamide, methotrexate and fluorouracil, began on 06/19/2020. Today is day 1 cycle 5.   REVIEW OF SYSTEMS: [name] continues to tolerate treatment remarkably well although she did lose her hair.   COVID 19 VACCINATION STATUS: Status post [place] x2, most recently February 2021   HISTORY OF CURRENT ILLNESS: From the original intake note:  Tasha Tucker had routine screening mammography on 03/13/2020 showing a possible abnormality in the right breast. She underwent right diagnostic mammography with tomography and right breast ultrasonography at [facility] on 03/26/2020 showing: breast density category B; suspicious 1.9 cm mass/distortion in right breast at 9 o'clock; no right axillary lymphadenopathy.  Accordingly that same day, she proceeded to biopsy of the right breast area in question. The pathology from this procedure ([id number]) showed: invasive and in situ mammary carcinoma, grade 2, e-cadherin  negative. Prognostic indicators significant for: estrogen receptor, 90% positive and progesterone receptor, 90% positive, both with strong staining intensity. Proliferation marker Ki67 at 5%. HER2 negative by immunohistochemistry (1+).  The patient's subsequent history is as detailed below.   PAST MEDICAL HISTORY: Past Medical History:  Diagnosis Date  . Bladder cancer ([place])    [place]  . Breast cancer ([place])   . DJD (degenerative joint disease)    NECK, NEEDS PILLOW WHEN LYING FLAT IS POSSIBLE  . History of cerebral aneurysm repair    1996-- S/P CLAPPING RIGHT CAROTID --- NO DEFICITS  . 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 Hyperlipidemia   . Hypertension   . Liver hemangioma   . OA (osteoarthritis)   . PAC (premature atrial contraction)   . PONV (postoperative nausea and vomiting)   . Right trigger finger   . Type 2 diabetes mellitus ([place])   . Wears glasses     PAST SURGICAL HISTORY: Past Surgical History:  Procedure Laterality Date  . ABDOMINAL HYSTERECTOMY  1990   PARTIAL  . BREAST LUMPECTOMY WITH RADIOACTIVE SEED AND SENTINEL LYMPH NODE BIOPSY Right 04/17/2020   Procedure: BREAST LUMPECTOMY X 2  WITH RADIOACTIVE SEED AND SENTINEL LYMPH NODE BIOPSY;  Surgeon: Coralie Keens, MD;  Location: [facility];  Service: General;  Laterality: Right;  . CATARACT EXTRACTION W/ INTRAOCULAR LENS  IMPLANT, BILATERAL  2016; 2017  . CEREBRAL ANEURYSM REPAIR  1996   clipped-no deficits RIGHT CAROTID  . CHOLECYSTECTOMY  2010  . CYSTOSCOPY W/ RETROGRADES Right 06/09/2017   Procedure: CYSTOSCOPY WITH RETROGRADE PYELOGRAM;  Surgeon: Alexis Frock, MD;  Location: [facility];  Service: Urology;  Laterality: Right;  . CYSTOSCOPY W/ RETROGRADES  Right 07/21/2017   Procedure: CYSTOSCOPY WITH RETROGRADE PYELOGRAM;  Surgeon: Alexis Frock, MD;  Location: [facility];   Service: Urology;  Laterality: Right;  . CYSTOSCOPY WITH RETROGRADE PYELOGRAM, URETEROSCOPY AND STENT PLACEMENT Left 08/26/2016   Procedure: CYSTOSCOPY WITH BILATERAL RETROGRADE PYELOGRAM,LEFT URETEROSCOPY WITH BIOPSY AND LEFT STENT PLACEMENT;  Surgeon: Alexis Frock, MD;  Location: [facility];  Service: Urology;  Laterality: Left;  . PORTACATH PLACEMENT Left 06/18/2020   Procedure: INSERTION PORT-A-CATH WITH ULTRASOUND GUIDANCE;  Surgeon: Coralie Keens, MD;  Location: [facility];  Service: General;  Laterality: Left;  . ROBOT ASSITED LAPAROSCOPIC NEPHROURETERECTOMY Left 10/09/2016   Procedure: XI ROBOT ASSITED LAPAROSCOPIC NEPHROURETERECTOMY;  Surgeon: Alexis Frock, MD;  Location: [facility];  Service: Urology;  Laterality: Left;  . SURGERY ON FOOT FOR STAPH INFECTION  1989  . TRANSURETHRAL RESECTION OF BLADDER TUMOR N/A 07/21/2017   Procedure: TRANSURETHRAL RESECTION OF BLADDER TUMOR (TURBT);  Surgeon: Alexis Frock, MD;  Location[facility];  Service: Urology;  Laterality: N/A;  . TRANSURETHRAL RESECTION OF BLADDER TUMOR WITH MITOMYCIN-C N/A 06/09/2017   Procedure: TRANSURETHRAL RESECTION OF BLADDER TUMOR WITH MITOMYCIN-C;  Surgeon: Alexis Frock, MD;  Location: [facility];  Service: Urology;  Laterality: N/A;  . TUBAL LIGATION  1980    FAMILY HISTORY: Family History  Problem Relation Age of Onset  . Cancer Mother   . Hyperlipidemia Father   . Heart attack Father   . Diabetes Brother   . Breast cancer Neg Hx    Her father died at age 84 from ASCVD. Her mother died at age 56. She reports her mother had a tumor in her thigh at age 67, possibly a sarcoma. The patient has one brother, with no history of cancer. Her son, Tasha Tucker, had brain cancer (oligodedroglioma, possibly) and is doing well now at age 4 (as of 03/2020).   GYNECOLOGIC HISTORY:  No LMP recorded. Patient has had a hysterectomy. Menarche: 69 years old Age at first live birth: 69  years old Start P 3 LMP 1990 Contraceptive: used pills for 8 years without issues HRT never used  Hysterectomy? Yes, 1990 BSO? 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 Husband Liliane Channel is a retired Garment/textile technologist for the [employer]. She lives at home with husband Liliane Channel and their cat, Pawnee. Daughter Bryson Ha, age 51, works as a Careers adviser in [place]. Son Tasha Tucker, age 17, works in Human resources officer in [place]. Son Ruby Cola, age 21, lives in [place] and was previously a paramedic. Tanzie has 4 grandchildren. She attends a [employer].     ADVANCED DIRECTIVES: In the absence of any documentation to the contrary, the patient's spouse is their HCPOA.    HEALTH MAINTENANCE: Social History   Tobacco Use  . Smoking status: Never Smoker  . Smokeless tobacco: Never Used  Vaping Use  . Vaping Use: Never used  Substance Use Topics  . Alcohol use: Not Currently    Comment: , STOPPED  . Drug use: No     Colonoscopy: Cologuard 2020 was negative  PAP: date unknown, s/p hysterectomy  Bone density: 03/2018, +1.5   Allergies  Allergen Reactions  . Adhesive [Tape] Rash    PAPER TAPE ONLY  . Chlorhexidine Gluconate Itching and Rash    Topical antiseptic (CHG WIPES)   . Dilantin [Phenytoin] Rash  . Penicillins Rash    Has patient had a PCN reaction causing immediate rash, facial/tongue/throat  swelling, SOB or lightheadedness with hypotension:unsure Has patient had a PCN reaction causing severe rash involving mucus membranes or skin necrosis:unsure Has patient had a PCN reaction that required hospitalization:No Has patient had a PCN reaction occurring within the last 10 years:No If all of the above answers are "NO", then may proceed with Cephalosporin use.   . Sulfa Antibiotics Rash    Current Outpatient Medications  Medication Sig Dispense Refill  . acetaminophen (TYLENOL) 500 MG tablet Take 500 mg by  mouth at bedtime.     Marland Kitchen anastrozole (ARIMIDEX) 1 MG tablet Take 1 tablet (1 mg total) by mouth daily. 90 tablet 4  . BD PEN NEEDLE NANO U/F 32G X 4 MM MISC USE FOR INSULIN ADMINISTRATION DAILY 100 each 3  . carvedilol (COREG) 6.25 MG tablet Take 1 tablet (6.25 mg total) by mouth 2 (two) times daily. 180 tablet 3  . cetirizine (ZYRTEC) 10 MG tablet Take 10 mg by mouth every morning.     Marland Kitchen HUMALOG KWIKPEN 100 UNIT/ML KwikPen Inject 5-6 Units into the skin 3 (three) times daily.     Marland Kitchen LANTUS SOLOSTAR 100 UNIT/ML Solostar Pen Inject 24- 26 units subcue daily for diabetes (Patient taking differently: Inject 16 Units into the skin every evening. ) 15 mL 6  . lidocaine-prilocaine (EMLA) cream Apply to affected area once 30 g 3  . ONETOUCH DELICA LANCETS 72C MISC Use lancet to stick finger and check blood sugar two times daily. 100 each 6  . ONETOUCH VERIO test strip USE AS DIRECTED. 300 each 2  . pravastatin (PRAVACHOL) 40 MG tablet Take 1 tablet (40 mg total) by mouth daily. 90 tablet 3  . prochlorperazine (COMPAZINE) 10 MG tablet Take 1 tablet (10 mg total) by mouth every 6 (six) hours as needed (Nausea or vomiting). 30 tablet 1   No current facility-administered medications for this visit.   Facility-Administered Medications Ordered in Other Visits  Medication Dose Route Frequency Provider Last Rate Last Admin  . sodium chloride flush (NS) 0.9 % injection 10 mL  10 mL Intracatheter PRN Terralyn Matsumura, Virgie Dad, MD   10 mL at 09/17/20 1531    OBJECTIVE: White woman who appears stated age  For vitals on the 09/17/2020 visit please see the treatment area flowsheet  There were no vitals filed for this visit.   There is no height or weight on file to calculate BMI.   Wt Readings from Last 3 Encounters:  08/27/20 227 lb 6.4 oz (103.1 kg)  08/06/20 230 lb 12.8 oz (104.7 kg)  07/18/20 228 lb 6.4 oz (103.6 kg)      ECOG FS:1 - Symptomatic but completely ambulatory  Sclerae unicteric, EOMs  intact Wearing a mask Lungs no rales or rhonchi Heart regular rate and rhythm Abd soft, nontender, positive bowel sounds Neuro: nonfocal, well oriented, appropriate affect Breasts: Deferred   LAB RESULTS:  CMP     Component Value Date/Time   NA 139 09/17/2020 1150   NA 140 04/21/2019 0000   K 4.5 09/17/2020 1150   CL 109 09/17/2020 1150   CO2 22 09/17/2020 1150   GLUCOSE 146 (H) 09/17/2020 1150   BUN 33 (H) 09/17/2020 1150   BUN 36 (A) 08/30/2017 0000   CREATININE 1.80 (H) 09/17/2020 1150   CREATININE 1.78 (H) 06/19/2020 0842   CALCIUM 9.5 09/17/2020 1150   PROT 7.1 09/17/2020 1150   ALBUMIN 3.5 09/17/2020 1150   AST 26 09/17/2020 1150   AST 24 06/19/2020 0842   ALT  31 09/17/2020 1150   ALT 30 06/19/2020 0842   ALKPHOS 105 09/17/2020 1150   BILITOT 0.5 09/17/2020 1150   BILITOT 0.5 06/19/2020 0842   GFRNONAA 30 (L) 09/17/2020 1150   GFRNONAA 28 (L) 06/19/2020 0842   GFRAA 34 (L) 06/26/2020 1535   GFRAA 33 (L) 06/19/2020 0842    No results found for: TOTALPROTELP, ALBUMINELP, A1GS, A2GS, BETS, BETA2SER, GAMS, MSPIKE, SPEI  Lab Results  Component Value Date   WBC 9.6 09/17/2020   NEUTROABS 7.4 09/17/2020   HGB 13.9 09/17/2020   HCT 41.2 09/17/2020   MCV 96.7 09/17/2020   PLT 119 (L) 09/17/2020    No results found for: LABCA2  No components found for: VZCHYI502  No results for input(s): INR in the last 168 hours.  No results found for: LABCA2  No results found for: DXA128  No results found for: NOM767  No results found for: MCN470  No results found for: CA2729  No components found for: HGQUANT  No results found for: CEA1 / No results found for: CEA1   No results found for: AFPTUMOR  No results found for: CHROMOGRNA  No results found for: KPAFRELGTCHN, LAMBDASER, KAPLAMBRATIO (kappa/lambda light chains)  No results found for: HGBA, HGBA2QUANT, HGBFQUANT, HGBSQUAN (Hemoglobinopathy evaluation)   No results found for: LDH  No results found  for: IRON, TIBC, IRONPCTSAT (Iron and TIBC)  No results found for: FERRITIN  Urinalysis    Component Value Date/Time   COLORURINE YELLOW 08/06/2020 1414   APPEARANCEUR HAZY (A) 08/06/2020 1414   LABSPEC 1.009 08/06/2020 1414   PHURINE 6.0 08/06/2020 1414   GLUCOSEU NEGATIVE 08/06/2020 1414   HGBUR SMALL (A) 08/06/2020 1414   BILIRUBINUR NEGATIVE 08/06/2020 1414   BILIRUBINUR negative 06/27/2018 0934   KETONESUR NEGATIVE 08/06/2020 1414   PROTEINUR NEGATIVE 08/06/2020 1414   UROBILINOGEN negative (A) 06/27/2018 0934   UROBILINOGEN 1.0 04/29/2013 1455   NITRITE NEGATIVE 08/06/2020 1414   LEUKOCYTESUR LARGE (A) 08/06/2020 1414    STUDIES: No results found.   ELIGIBLE FOR AVAILABLE RESEARCH PROTOCOL: AET  ASSESSMENT: 69 y.o. [place] woman status post right breast upper outer quadrant biopsy 03/26/2020, for a clinical T1c N0, stage IA invasive lobular carcinoma, E-cadherin negative, grade 2, estrogen and progesterone receptor positive, with an MIB-1 of 5% and no HER-2 amplification  (a) unable to obtain breast MRI due to renal concern with contrast status post nephrectomy  (1) status post right lumpectomy and sentinel lymph node sampling 04/17/2020 for a pT2 pN0, stage IB invasive lobular carcinoma, grade 2, with negative margins  (a) a single right axillary lymph node was removed.  (2) Oncotype score of 29 predicts a risk of recurrence outside the breast within the next 9 years of 18% with antiestrogens only, and also a greater than 15% benefit from chemotherapy.  (3) chemotherapy consisting of cyclophosphamide, methotrexate and fluorouracil given every 21 days x 8, started 06/19/2020  (a) Udenyca added a day 3 beginning with cycle 2 secondary to treatment delays due to neutropenia  (4) adjuvant radiation can be given concurrently with chemotherapy  (5) anastrozole started 04/03/2020 with anticipation of surgical delays, interrupted during  chemotherapy   PLAN: Bridgitt will receive her fifth of 8 planned chemotherapy cycles today. She wonders when she will be starting adjuvant radiation. I believe Dr. Lisbeth Renshaw I would prefer in her case to wait until she has completed all her treatments but I will confirm that when I see him at the clinic tomorrow  She  continues to have problems with her port which usually needs to have TPA dwelt. We discussed sending her to IR for investigation and possible clearance but she is reluctant to undergo further procedures.  She knows to call for any other issues that may develop before the next visit  Total encounter time 25 minutes.Sarajane Jews C. Shaneca Orne, MD 09/17/2020 5:59 PM Medical Oncology and Hematology [facility] [place], [place] [address] Tel. [phone]    Fax. [phone]   This document serves as a record of services personally performed by Lurline Del, MD. It was created on his behalf by Wilburn Mylar, a trained medical scribe. The creation of this record is based on the scribe's personal observations and the provider's statements to them.   I, Lurline Del MD, have reviewed the above documentation for accuracy and completeness, and I agree with the above.   *Total Encounter Time as defined by the Centers for Medicare and Medicaid Services includes, in addition to the face-to-face time of a patient visit (documented in the note above) non-face-to-face time: obtaining and reviewing outside history, ordering and reviewing medications, tests or procedures, care coordination (communications with other health care professionals or caregivers) and documentation in the medical record.

## 2020-09-17 ENCOUNTER — Inpatient Hospital Stay: Payer: Medicare Other

## 2020-09-17 ENCOUNTER — Telehealth: Payer: Self-pay | Admitting: *Deleted

## 2020-09-17 ENCOUNTER — Other Ambulatory Visit: Payer: Self-pay

## 2020-09-17 ENCOUNTER — Inpatient Hospital Stay (HOSPITAL_BASED_OUTPATIENT_CLINIC_OR_DEPARTMENT_OTHER): Payer: Medicare Other | Admitting: Oncology

## 2020-09-17 VITALS — BP 153/75 | HR 61 | Temp 97.9°F | Resp 18

## 2020-09-17 DIAGNOSIS — Z170 Estrogen receptor positive status [ER+]: Secondary | ICD-10-CM

## 2020-09-17 DIAGNOSIS — C652 Malignant neoplasm of left renal pelvis: Secondary | ICD-10-CM | POA: Diagnosis not present

## 2020-09-17 DIAGNOSIS — C50411 Malignant neoplasm of upper-outer quadrant of right female breast: Secondary | ICD-10-CM

## 2020-09-17 DIAGNOSIS — Z95828 Presence of other vascular implants and grafts: Secondary | ICD-10-CM

## 2020-09-17 DIAGNOSIS — N184 Chronic kidney disease, stage 4 (severe): Secondary | ICD-10-CM

## 2020-09-17 DIAGNOSIS — Z794 Long term (current) use of insulin: Secondary | ICD-10-CM

## 2020-09-17 LAB — CBC WITH DIFFERENTIAL/PLATELET
Abs Immature Granulocytes: 0.08 10*3/uL — ABNORMAL HIGH (ref 0.00–0.07)
Basophils Absolute: 0.2 10*3/uL — ABNORMAL HIGH (ref 0.0–0.1)
Basophils Relative: 2 %
Eosinophils Absolute: 0.3 10*3/uL (ref 0.0–0.5)
Eosinophils Relative: 3 %
HCT: 41.2 % (ref 36.0–46.0)
Hemoglobin: 13.9 g/dL (ref 12.0–15.0)
Immature Granulocytes: 1 %
Lymphocytes Relative: 10 %
Lymphs Abs: 1 10*3/uL (ref 0.7–4.0)
MCH: 32.6 pg (ref 26.0–34.0)
MCHC: 33.7 g/dL (ref 30.0–36.0)
MCV: 96.7 fL (ref 80.0–100.0)
Monocytes Absolute: 0.7 10*3/uL (ref 0.1–1.0)
Monocytes Relative: 7 %
Neutro Abs: 7.4 10*3/uL (ref 1.7–7.7)
Neutrophils Relative %: 77 %
Platelets: 119 10*3/uL — ABNORMAL LOW (ref 150–400)
RBC: 4.26 MIL/uL (ref 3.87–5.11)
RDW: 16.5 % — ABNORMAL HIGH (ref 11.5–15.5)
WBC: 9.6 10*3/uL (ref 4.0–10.5)
nRBC: 0 % (ref 0.0–0.2)

## 2020-09-17 LAB — COMPREHENSIVE METABOLIC PANEL
ALT: 31 U/L (ref 0–44)
AST: 26 U/L (ref 15–41)
Albumin: 3.5 g/dL (ref 3.5–5.0)
Alkaline Phosphatase: 105 U/L (ref 38–126)
Anion gap: 8 (ref 5–15)
BUN: 33 mg/dL — ABNORMAL HIGH (ref 8–23)
CO2: 22 mmol/L (ref 22–32)
Calcium: 9.5 mg/dL (ref 8.9–10.3)
Chloride: 109 mmol/L (ref 98–111)
Creatinine, Ser: 1.8 mg/dL — ABNORMAL HIGH (ref 0.44–1.00)
GFR, Estimated: 30 mL/min — ABNORMAL LOW (ref >60)
Glucose, Bld: 146 mg/dL — ABNORMAL HIGH (ref 70–99)
Potassium: 4.5 mmol/L (ref 3.5–5.1)
Sodium: 139 mmol/L (ref 135–145)
Total Bilirubin: 0.5 mg/dL (ref 0.3–1.2)
Total Protein: 7.1 g/dL (ref 6.5–8.1)

## 2020-09-17 MED ORDER — SODIUM CHLORIDE 0.9% FLUSH
10.0000 mL | INTRAVENOUS | Status: DC | PRN
  Administered 2020-09-17: 12:00:00 10 mL
  Filled 2020-09-17: qty 10

## 2020-09-17 MED ORDER — PALONOSETRON HCL INJECTION 0.25 MG/5ML
0.2500 mg | Freq: Once | INTRAVENOUS | Status: AC
  Administered 2020-09-17: 0.25 mg via INTRAVENOUS

## 2020-09-17 MED ORDER — PROCHLORPERAZINE EDISYLATE 10 MG/2ML IJ SOLN
10.0000 mg | Freq: Once | INTRAMUSCULAR | Status: AC
  Administered 2020-09-17: 10 mg via INTRAVENOUS

## 2020-09-17 MED ORDER — SODIUM CHLORIDE 0.9 % IV SOLN
150.0000 mg | Freq: Once | INTRAVENOUS | Status: AC
  Administered 2020-09-17: 14:00:00 150 mg via INTRAVENOUS
  Filled 2020-09-17: qty 150

## 2020-09-17 MED ORDER — PROCHLORPERAZINE EDISYLATE 10 MG/2ML IJ SOLN
INTRAMUSCULAR | Status: AC
  Filled 2020-09-17: qty 2

## 2020-09-17 MED ORDER — HEPARIN SOD (PORK) LOCK FLUSH 100 UNIT/ML IV SOLN
500.0000 [IU] | Freq: Once | INTRAVENOUS | Status: AC | PRN
  Administered 2020-09-17: 16:00:00 500 [IU]
  Filled 2020-09-17: qty 5

## 2020-09-17 MED ORDER — FLUOROURACIL CHEMO INJECTION 2.5 GM/50ML
600.0000 mg/m2 | Freq: Once | INTRAVENOUS | Status: AC
  Administered 2020-09-17: 15:00:00 1350 mg via INTRAVENOUS
  Filled 2020-09-17: qty 27

## 2020-09-17 MED ORDER — METHOTREXATE SODIUM (PF) CHEMO INJECTION 250 MG/10ML
30.0000 mg/m2 | Freq: Once | INTRAMUSCULAR | Status: AC
  Administered 2020-09-17: 15:00:00 67 mg via INTRAVENOUS
  Filled 2020-09-17: qty 2.68

## 2020-09-17 MED ORDER — SODIUM CHLORIDE 0.9 % IV SOLN
500.0000 mg/m2 | Freq: Once | INTRAVENOUS | Status: AC
  Administered 2020-09-17: 15:00:00 1120 mg via INTRAVENOUS
  Filled 2020-09-17: qty 56

## 2020-09-17 MED ORDER — PALONOSETRON HCL INJECTION 0.25 MG/5ML
INTRAVENOUS | Status: AC
  Filled 2020-09-17: qty 5

## 2020-09-17 MED ORDER — SODIUM CHLORIDE 0.9% FLUSH
10.0000 mL | INTRAVENOUS | Status: DC | PRN
  Administered 2020-09-17: 16:00:00 10 mL
  Filled 2020-09-17: qty 10

## 2020-09-17 MED ORDER — SODIUM CHLORIDE 0.9 % IV SOLN
Freq: Once | INTRAVENOUS | Status: AC
  Filled 2020-09-17: qty 250

## 2020-09-17 MED ORDER — ALTEPLASE 2 MG IJ SOLR
2.0000 mg | Freq: Once | INTRAMUSCULAR | Status: AC | PRN
  Administered 2020-09-17: 12:00:00 2 mg
  Filled 2020-09-17: qty 2

## 2020-09-17 MED ORDER — ALTEPLASE 2 MG IJ SOLR
INTRAMUSCULAR | Status: AC
  Filled 2020-09-17: qty 2

## 2020-09-17 NOTE — Patient Instructions (Signed)
[facility] Discharge Instructions for Patients Receiving Chemotherapy  Today you received the following chemotherapy agents: Cytoxan/Methotrexate/5FU.  To help prevent nausea and vomiting after your treatment, we encourage you to take your nausea medication as directed.   If you develop nausea and vomiting that is not controlled by your nausea medication, call the clinic.   BELOW ARE SYMPTOMS THAT SHOULD BE REPORTED IMMEDIATELY:  *FEVER GREATER THAN 100.5 F  *CHILLS WITH OR WITHOUT FEVER  NAUSEA AND VOMITING THAT IS NOT CONTROLLED WITH YOUR NAUSEA MEDICATION  *UNUSUAL SHORTNESS OF BREATH  *UNUSUAL BRUISING OR BLEEDING  TENDERNESS IN MOUTH AND THROAT WITH OR WITHOUT PRESENCE OF ULCERS  *URINARY PROBLEMS  *BOWEL PROBLEMS  UNUSUAL RASH Items with * indicate a potential emergency and should be followed up as soon as possible.  Feel free to call the clinic should you have any questions or concerns. The clinic phone number is [phone].  Please show the [facility] at check-in to the Emergency Department and triage nurse.

## 2020-09-17 NOTE — Telephone Encounter (Signed)
Ok to treat per MD with creatinine of 1.88- informed treatment room nurse.

## 2020-09-17 NOTE — Telephone Encounter (Signed)
Question about prescription advised to call md for clarification

## 2020-09-17 NOTE — Progress Notes (Signed)
Blood return noted before methotrexate and 5FU administration

## 2020-09-17 NOTE — Progress Notes (Signed)
No blood return from port. Cathflo administered by Loren, RN. Labs drawn peripherally.  

## 2020-09-18 ENCOUNTER — Other Ambulatory Visit: Payer: Medicare Other

## 2020-09-18 ENCOUNTER — Ambulatory Visit: Payer: Medicare Other

## 2020-09-18 ENCOUNTER — Ambulatory Visit: Payer: Medicare Other | Admitting: Oncology

## 2020-09-19 ENCOUNTER — Inpatient Hospital Stay: Payer: Medicare Other

## 2020-09-19 ENCOUNTER — Other Ambulatory Visit: Payer: Self-pay

## 2020-09-19 VITALS — BP 157/59 | HR 67 | Resp 18

## 2020-09-19 DIAGNOSIS — N184 Chronic kidney disease, stage 4 (severe): Secondary | ICD-10-CM

## 2020-09-19 DIAGNOSIS — Z170 Estrogen receptor positive status [ER+]: Secondary | ICD-10-CM

## 2020-09-19 DIAGNOSIS — E118 Type 2 diabetes mellitus with unspecified complications: Secondary | ICD-10-CM

## 2020-09-19 DIAGNOSIS — C50411 Malignant neoplasm of upper-outer quadrant of right female breast: Secondary | ICD-10-CM | POA: Diagnosis not present

## 2020-09-19 MED ORDER — PEGFILGRASTIM-CBQV 6 MG/0.6ML ~~LOC~~ SOSY
6.0000 mg | PREFILLED_SYRINGE | Freq: Once | SUBCUTANEOUS | Status: AC
  Administered 2020-09-19: 13:00:00 6 mg via SUBCUTANEOUS

## 2020-09-19 MED ORDER — PEGFILGRASTIM-CBQV 6 MG/0.6ML ~~LOC~~ SOSY
PREFILLED_SYRINGE | SUBCUTANEOUS | Status: AC
  Filled 2020-09-19: qty 0.6

## 2020-09-19 NOTE — Patient Instructions (Signed)

## 2020-09-20 ENCOUNTER — Ambulatory Visit: Payer: Medicare Other

## 2020-09-25 ENCOUNTER — Encounter: Payer: Self-pay | Admitting: Oncology

## 2020-09-26 ENCOUNTER — Telehealth: Payer: Self-pay | Admitting: Oncology

## 2020-09-26 NOTE — Telephone Encounter (Signed)
Rescheduled appts per provider template change. Pt confirmed appt date and time.

## 2020-10-02 NOTE — Progress Notes (Signed)
The following biosimilar Ziextenzo (pegfilgrastim-bmez) has been selected for use in this patient.  Kennith Center, Pharm.D., CPP 10/02/2020@2 :59 PM

## 2020-10-08 ENCOUNTER — Inpatient Hospital Stay: Payer: Medicare Other | Attending: Oncology

## 2020-10-08 ENCOUNTER — Telehealth: Payer: Self-pay | Admitting: *Deleted

## 2020-10-08 ENCOUNTER — Inpatient Hospital Stay: Payer: Medicare Other

## 2020-10-08 ENCOUNTER — Other Ambulatory Visit: Payer: Self-pay

## 2020-10-08 VITALS — BP 151/64 | HR 70 | Temp 98.4°F | Resp 17

## 2020-10-08 DIAGNOSIS — E785 Hyperlipidemia, unspecified: Secondary | ICD-10-CM | POA: Diagnosis not present

## 2020-10-08 DIAGNOSIS — N184 Chronic kidney disease, stage 4 (severe): Secondary | ICD-10-CM

## 2020-10-08 DIAGNOSIS — Z170 Estrogen receptor positive status [ER+]: Secondary | ICD-10-CM | POA: Insufficient documentation

## 2020-10-08 DIAGNOSIS — C50411 Malignant neoplasm of upper-outer quadrant of right female breast: Secondary | ICD-10-CM

## 2020-10-08 DIAGNOSIS — Z5189 Encounter for other specified aftercare: Secondary | ICD-10-CM | POA: Diagnosis not present

## 2020-10-08 DIAGNOSIS — Z794 Long term (current) use of insulin: Secondary | ICD-10-CM

## 2020-10-08 DIAGNOSIS — E119 Type 2 diabetes mellitus without complications: Secondary | ICD-10-CM | POA: Diagnosis not present

## 2020-10-08 DIAGNOSIS — Z8551 Personal history of malignant neoplasm of bladder: Secondary | ICD-10-CM | POA: Diagnosis not present

## 2020-10-08 DIAGNOSIS — E118 Type 2 diabetes mellitus with unspecified complications: Secondary | ICD-10-CM

## 2020-10-08 DIAGNOSIS — Z5111 Encounter for antineoplastic chemotherapy: Secondary | ICD-10-CM | POA: Diagnosis not present

## 2020-10-08 DIAGNOSIS — Z79899 Other long term (current) drug therapy: Secondary | ICD-10-CM | POA: Insufficient documentation

## 2020-10-08 DIAGNOSIS — I10 Essential (primary) hypertension: Secondary | ICD-10-CM | POA: Insufficient documentation

## 2020-10-08 DIAGNOSIS — M1990 Unspecified osteoarthritis, unspecified site: Secondary | ICD-10-CM | POA: Insufficient documentation

## 2020-10-08 DIAGNOSIS — C652 Malignant neoplasm of left renal pelvis: Secondary | ICD-10-CM

## 2020-10-08 DIAGNOSIS — Z95828 Presence of other vascular implants and grafts: Secondary | ICD-10-CM

## 2020-10-08 LAB — COMPREHENSIVE METABOLIC PANEL
ALT: 32 U/L (ref 0–44)
AST: 31 U/L (ref 15–41)
Albumin: 3.6 g/dL (ref 3.5–5.0)
Alkaline Phosphatase: 114 U/L (ref 38–126)
Anion gap: 10 (ref 5–15)
BUN: 35 mg/dL — ABNORMAL HIGH (ref 8–23)
CO2: 21 mmol/L — ABNORMAL LOW (ref 22–32)
Calcium: 9.5 mg/dL (ref 8.9–10.3)
Chloride: 108 mmol/L (ref 98–111)
Creatinine, Ser: 1.81 mg/dL — ABNORMAL HIGH (ref 0.44–1.00)
GFR, Estimated: 30 mL/min — ABNORMAL LOW (ref >60)
Glucose, Bld: 245 mg/dL — ABNORMAL HIGH (ref 70–99)
Potassium: 4.6 mmol/L (ref 3.5–5.1)
Sodium: 139 mmol/L (ref 135–145)
Total Bilirubin: 0.7 mg/dL (ref 0.3–1.2)
Total Protein: 7.3 g/dL (ref 6.5–8.1)

## 2020-10-08 LAB — CBC WITH DIFFERENTIAL/PLATELET
Abs Immature Granulocytes: 0.02 10*3/uL (ref 0.00–0.07)
Basophils Absolute: 0.1 10*3/uL (ref 0.0–0.1)
Basophils Relative: 1 %
Eosinophils Absolute: 0.1 10*3/uL (ref 0.0–0.5)
Eosinophils Relative: 2 %
HCT: 40.4 % (ref 36.0–46.0)
Hemoglobin: 13.9 g/dL (ref 12.0–15.0)
Immature Granulocytes: 0 %
Lymphocytes Relative: 13 %
Lymphs Abs: 1.1 10*3/uL (ref 0.7–4.0)
MCH: 33.8 pg (ref 26.0–34.0)
MCHC: 34.4 g/dL (ref 30.0–36.0)
MCV: 98.3 fL (ref 80.0–100.0)
Monocytes Absolute: 0.5 10*3/uL (ref 0.1–1.0)
Monocytes Relative: 7 %
Neutro Abs: 6.1 10*3/uL (ref 1.7–7.7)
Neutrophils Relative %: 77 %
Platelets: 127 10*3/uL — ABNORMAL LOW (ref 150–400)
RBC: 4.11 MIL/uL (ref 3.87–5.11)
RDW: 15.7 % — ABNORMAL HIGH (ref 11.5–15.5)
WBC: 8 10*3/uL (ref 4.0–10.5)
nRBC: 0 % (ref 0.0–0.2)

## 2020-10-08 MED ORDER — PROCHLORPERAZINE EDISYLATE 10 MG/2ML IJ SOLN
INTRAMUSCULAR | Status: AC
  Filled 2020-10-08: qty 2

## 2020-10-08 MED ORDER — PALONOSETRON HCL INJECTION 0.25 MG/5ML
0.2500 mg | Freq: Once | INTRAVENOUS | Status: AC
  Administered 2020-10-08: 0.25 mg via INTRAVENOUS

## 2020-10-08 MED ORDER — FLUOROURACIL CHEMO INJECTION 2.5 GM/50ML
600.0000 mg/m2 | Freq: Once | INTRAVENOUS | Status: AC
  Administered 2020-10-08: 1350 mg via INTRAVENOUS
  Filled 2020-10-08: qty 27

## 2020-10-08 MED ORDER — SODIUM CHLORIDE 0.9 % IV SOLN
150.0000 mg | Freq: Once | INTRAVENOUS | Status: AC
  Administered 2020-10-08: 150 mg via INTRAVENOUS
  Filled 2020-10-08: qty 150

## 2020-10-08 MED ORDER — SODIUM CHLORIDE 0.9 % IV SOLN
Freq: Once | INTRAVENOUS | Status: AC
  Filled 2020-10-08: qty 250

## 2020-10-08 MED ORDER — SODIUM CHLORIDE 0.9% FLUSH
10.0000 mL | INTRAVENOUS | Status: DC | PRN
  Administered 2020-10-08: 10 mL
  Filled 2020-10-08: qty 10

## 2020-10-08 MED ORDER — SODIUM CHLORIDE 0.9 % IV SOLN
500.0000 mg/m2 | Freq: Once | INTRAVENOUS | Status: AC
  Administered 2020-10-08: 1120 mg via INTRAVENOUS
  Filled 2020-10-08: qty 56

## 2020-10-08 MED ORDER — METHOTREXATE SODIUM (PF) CHEMO INJECTION 250 MG/10ML
30.0000 mg/m2 | Freq: Once | INTRAMUSCULAR | Status: AC
  Administered 2020-10-08: 67 mg via INTRAVENOUS
  Filled 2020-10-08: qty 2.68

## 2020-10-08 MED ORDER — ALTEPLASE 2 MG IJ SOLR
INTRAMUSCULAR | Status: AC
  Filled 2020-10-08: qty 2

## 2020-10-08 MED ORDER — ALTEPLASE 2 MG IJ SOLR
2.0000 mg | Freq: Once | INTRAMUSCULAR | Status: AC | PRN
  Administered 2020-10-08: 2 mg
  Filled 2020-10-08: qty 2

## 2020-10-08 MED ORDER — PROCHLORPERAZINE EDISYLATE 10 MG/2ML IJ SOLN
10.0000 mg | Freq: Once | INTRAMUSCULAR | Status: AC
  Administered 2020-10-08: 10 mg via INTRAVENOUS

## 2020-10-08 MED ORDER — HEPARIN SOD (PORK) LOCK FLUSH 100 UNIT/ML IV SOLN
500.0000 [IU] | Freq: Once | INTRAVENOUS | Status: AC | PRN
  Administered 2020-10-08: 500 [IU]
  Filled 2020-10-08: qty 5

## 2020-10-08 MED ORDER — PALONOSETRON HCL INJECTION 0.25 MG/5ML
INTRAVENOUS | Status: AC
  Filled 2020-10-08: qty 5

## 2020-10-08 NOTE — Telephone Encounter (Signed)
Per MD review of creatinine 1.8- order to proceed with CMF as scheduled.

## 2020-10-11 ENCOUNTER — Inpatient Hospital Stay: Payer: Medicare Other

## 2020-10-11 ENCOUNTER — Other Ambulatory Visit: Payer: Self-pay

## 2020-10-11 VITALS — BP 131/50 | HR 65 | Temp 97.4°F | Resp 18

## 2020-10-11 DIAGNOSIS — N184 Chronic kidney disease, stage 4 (severe): Secondary | ICD-10-CM

## 2020-10-11 DIAGNOSIS — E118 Type 2 diabetes mellitus with unspecified complications: Secondary | ICD-10-CM

## 2020-10-11 DIAGNOSIS — Z794 Long term (current) use of insulin: Secondary | ICD-10-CM

## 2020-10-11 DIAGNOSIS — C50411 Malignant neoplasm of upper-outer quadrant of right female breast: Secondary | ICD-10-CM | POA: Diagnosis not present

## 2020-10-11 DIAGNOSIS — Z170 Estrogen receptor positive status [ER+]: Secondary | ICD-10-CM

## 2020-10-11 MED ORDER — PEGFILGRASTIM-BMEZ 6 MG/0.6ML ~~LOC~~ SOSY
PREFILLED_SYRINGE | SUBCUTANEOUS | Status: AC
  Filled 2020-10-11: qty 0.6

## 2020-10-11 MED ORDER — PEGFILGRASTIM-BMEZ 6 MG/0.6ML ~~LOC~~ SOSY
6.0000 mg | PREFILLED_SYRINGE | Freq: Once | SUBCUTANEOUS | Status: AC
  Administered 2020-10-11: 6 mg via SUBCUTANEOUS

## 2020-10-11 NOTE — Patient Instructions (Signed)
Pegfilgrastim injection What is this medicine? PEGFILGRASTIM (PEG fil gra stim) is a long-acting granulocyte colony-stimulating factor that stimulates the growth of neutrophils, a type of white blood cell important in the body's fight against infection. It is used to reduce the incidence of fever and infection in patients with certain types of cancer who are receiving chemotherapy that affects the bone marrow, and to increase survival after being exposed to high doses of radiation. This medicine may be used for other purposes; ask your health care provider or pharmacist if you have questions. COMMON BRAND NAME(S): Fulphila, Neulasta, Nyvepria, UDENYCA, Ziextenzo What should I tell my health care provider before I take this medicine? They need to know if you have any of these conditions:  kidney disease  latex allergy  ongoing radiation therapy  sickle cell disease  skin reactions to acrylic adhesives (On-Body Injector only)  an unusual or allergic reaction to pegfilgrastim, filgrastim, other medicines, foods, dyes, or preservatives  pregnant or trying to get pregnant  breast-feeding How should I use this medicine? This medicine is for injection under the skin. If you get this medicine at home, you will be taught how to prepare and give the pre-filled syringe or how to use the On-body Injector. Refer to the patient Instructions for Use for detailed instructions. Use exactly as directed. Tell your healthcare provider immediately if you suspect that the On-body Injector may not have performed as intended or if you suspect the use of the On-body Injector resulted in a missed or partial dose. It is important that you put your used needles and syringes in a special sharps container. Do not put them in a trash can. If you do not have a sharps container, call your pharmacist or healthcare provider to get one. Talk to your pediatrician regarding the use of this medicine in children. While this drug  may be prescribed for selected conditions, precautions do apply. Overdosage: If you think you have taken too much of this medicine contact a poison control center or emergency room at once. NOTE: This medicine is only for you. Do not share this medicine with others. What if I miss a dose? It is important not to miss your dose. Call your doctor or health care professional if you miss your dose. If you miss a dose due to an On-body Injector failure or leakage, a new dose should be administered as soon as possible using a single prefilled syringe for manual use. What may interact with this medicine? Interactions have not been studied. This list may not describe all possible interactions. Give your health care provider a list of all the medicines, herbs, non-prescription drugs, or dietary supplements you use. Also tell them if you smoke, drink alcohol, or use illegal drugs. Some items may interact with your medicine. What should I watch for while using this medicine? Your condition will be monitored carefully while you are receiving this medicine. You may need blood work done while you are taking this medicine. Talk to your health care provider about your risk of cancer. You may be more at risk for certain types of cancer if you take this medicine. If you are going to need a MRI, CT scan, or other procedure, tell your doctor that you are using this medicine (On-Body Injector only). What side effects may I notice from receiving this medicine? Side effects that you should report to your doctor or health care professional as soon as possible:  allergic reactions (skin rash, itching or hives, swelling of   the face, lips, or tongue)  back pain  dizziness  fever  pain, redness, or irritation at site where injected  pinpoint red spots on the skin  red or dark-brown urine  shortness of breath or breathing problems  stomach or side pain, or pain at the shoulder  swelling  tiredness  trouble  passing urine or change in the amount of urine  unusual bruising or bleeding Side effects that usually do not require medical attention (report to your doctor or health care professional if they continue or are bothersome):  bone pain  muscle pain This list may not describe all possible side effects. Call your doctor for medical advice about side effects. You may report side effects to FDA at 1-800-FDA-1088. Where should I keep my medicine? Keep out of the reach of children. If you are using this medicine at home, you will be instructed on how to store it. Throw away any unused medicine after the expiration date on the label. NOTE: This sheet is a summary. It may not cover all possible information. If you have questions about this medicine, talk to your doctor, pharmacist, or health care provider.  2021 Elsevier/Gold Standard (2019-10-06 13:20:51)  

## 2020-10-21 ENCOUNTER — Other Ambulatory Visit: Payer: Self-pay

## 2020-10-21 ENCOUNTER — Ambulatory Visit: Payer: Medicare Other | Attending: Surgery

## 2020-10-21 DIAGNOSIS — Z483 Aftercare following surgery for neoplasm: Secondary | ICD-10-CM | POA: Insufficient documentation

## 2020-10-21 NOTE — Therapy (Signed)
[place], [place], [address] Phone: [phone]   Fax:  [phone]  Physical Therapy Treatment  Patient Details  Name: Tasha Tucker MRN: [id number] Date of Birth: Jan 24, 1951 Referring Provider (PT): Dr. Coralie Keens   Encounter Date: 10/21/2020   PT End of Session - 10/21/20 0905    Visit Number 2   #  unchanged due to screen only   Number of Visits 2    Date for PT Re-Evaluation 05/29/20    PT Start Time 0901    PT Stop Time 0909    PT Time Calculation (min) 8 min    Activity Tolerance Patient tolerated treatment well    Behavior During Therapy [facility] for tasks assessed/performed           Past Medical History:  Diagnosis Date  . Bladder cancer ([place])    [place]  . Breast cancer ([place])   . DJD (degenerative joint disease)    NECK, NEEDS PILLOW WHEN LYING FLAT IS POSSIBLE  . History of cerebral aneurysm repair    1996-- S/P CLAPPING RIGHT CAROTID --- NO DEFICITS  . 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 Hyperlipidemia   . Hypertension   . Liver hemangioma   . OA (osteoarthritis)   . PAC (premature atrial contraction)   . PONV (postoperative nausea and vomiting)   . Right trigger finger   . Type 2 diabetes mellitus ([place])   . Wears glasses     Past Surgical History:  Procedure Laterality Date  . ABDOMINAL HYSTERECTOMY  1990   PARTIAL  . BREAST LUMPECTOMY WITH RADIOACTIVE SEED AND SENTINEL LYMPH NODE BIOPSY Right 04/17/2020   Procedure: BREAST LUMPECTOMY X 2  WITH RADIOACTIVE SEED AND SENTINEL LYMPH NODE BIOPSY;  Surgeon: Coralie Keens, MD;  Location: [place];  Service: General;  Laterality: Right;  . CATARACT EXTRACTION W/ INTRAOCULAR LENS  IMPLANT, BILATERAL  2016; 2017  . CEREBRAL ANEURYSM REPAIR  1996   clipped-no deficits RIGHT CAROTID  . CHOLECYSTECTOMY  2010  .  CYSTOSCOPY W/ RETROGRADES Right 06/09/2017   Procedure: CYSTOSCOPY WITH RETROGRADE PYELOGRAM;  Surgeon: Alexis Frock, MD;  Location: [facility];  Service: Urology;  Laterality: Right;  . CYSTOSCOPY W/ RETROGRADES Right 07/21/2017   Procedure: CYSTOSCOPY WITH RETROGRADE PYELOGRAM;  Surgeon: Alexis Frock, MD;  Location: [facility];  Service: Urology;  Laterality: Right;  . CYSTOSCOPY WITH RETROGRADE PYELOGRAM, URETEROSCOPY AND STENT PLACEMENT Left 08/26/2016   Procedure: CYSTOSCOPY WITH BILATERAL RETROGRADE PYELOGRAM,LEFT URETEROSCOPY WITH BIOPSY AND LEFT STENT PLACEMENT;  Surgeon: Alexis Frock, MD;  Location: [facility];  Service: Urology;  Laterality: Left;  . PORTACATH PLACEMENT Left 06/18/2020   Procedure: INSERTION PORT-A-CATH WITH ULTRASOUND GUIDANCE;  Surgeon: Coralie Keens, MD;  Location: [place];  Service: General;  Laterality: Left;  . ROBOT ASSITED LAPAROSCOPIC NEPHROURETERECTOMY Left 10/09/2016   Procedure: XI ROBOT ASSITED LAPAROSCOPIC NEPHROURETERECTOMY;  Surgeon: Alexis Frock, MD;  Location: [facility];  Service: Urology;  Laterality: Left;  . SURGERY ON FOOT FOR STAPH INFECTION  1989  . TRANSURETHRAL RESECTION OF BLADDER TUMOR N/A 07/21/2017   Procedure: TRANSURETHRAL RESECTION OF BLADDER TUMOR (TURBT);  Surgeon: Alexis Frock, MD;  Location: [facility];  Service: Urology;  Laterality: N/A;  . TRANSURETHRAL RESECTION OF BLADDER TUMOR WITH MITOMYCIN-C N/A 06/09/2017   Procedure: TRANSURETHRAL RESECTION OF BLADDER TUMOR WITH MITOMYCIN-C;  Surgeon: Alexis Frock, MD;  Location[facility];  Service: Urology;  Laterality: N/A;  . TUBAL LIGATION  1980    There were no vitals filed for this visit.   Subjective Assessment - 10/21/20 0902    Subjective Pt returns for her 3 month L-Dex screen.    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 She underwent a right double lumpectomy with a sentinel node biopsy (1 negative node) on 04/17/2020. It is ER/PR positive and HER2 negative with a Ki67 of 5%. She has a history of bladder and renal carcinoma from 06/2017.                  L-DEX FLOWSHEETS - 10/21/20 0900      L-DEX LYMPHEDEMA SCREENING   Measurement Type Unilateral    L-DEX MEASUREMENT EXTREMITY Upper Extremity    POSITION  Standing    DOMINANT SIDE Right    At Risk Side Right    BASELINE SCORE (UNILATERAL) 4.1    L-DEX SCORE (UNILATERAL) 1.7    VALUE CHANGE (UNILAT) -2.4                                  PT Long Term Goals - 05/13/20 1444      PT LONG TERM GOAL #1   Title Patient will dmeonstrate she has regained full shoulder ROM and function post operatively compared to baselines.    Time 8    Period Weeks    Status Achieved                 Plan - 10/21/20 7116    Clinical Impression Statement Pt returns for her 3 month LDex screen, she is 6 months out from surgery. Her change from baseline of -2.4 is WNLs and so no further treatment is required at this time except to cont every 3 months L-Dex screens which pt is agreeable to.    PT Next Visit Plan Cont L-Dex screens every 3 months for up to 2 years from her SLNB, which will be around 04/17/22.    Consulted and Agree with Plan of Care Patient           Patient will benefit from skilled therapeutic intervention in order to improve the following deficits and impairments:     Visit Diagnosis: Aftercare following surgery for neoplasm     Problem List Patient Active Problem List   Diagnosis Date Noted  . Port-A-Cath in place 07/11/2020  . Malignant neoplasm of upper-outer quadrant of right breast in female, estrogen receptor positive ([place]) 03/28/2020  . Controlled type 2 diabetes mellitus with complication, with long-term current use of insulin ([place]) 03/19/2018  . Hyperlipidemia 01/12/2018  . Cerebral  aneurysm 12/29/2017  . Secondary hypertension, unspecified 12/29/2017  . CKD (chronic kidney disease) stage 4, GFR 15-29 ml/min (HCC) 12/29/2017  . Cancer of renal pelvis, left ([place]) 10/12/2016  . Renal mass 10/09/2016    Otelia Limes, PTA 10/21/2020, 9:12 AM  [place], [place], [address] Phone: [phone]   Fax:  [phone]  Name: NAOMII KREGER MRN: [id number] Date of Birth: [birth year]

## 2020-10-27 NOTE — Progress Notes (Signed)
Tasha Tucker, [place],  [address] [phone]  Date:  10/31/2020   Name:  Tasha Tucker   DOB:  Apr 17, 1951   MRN:  [id number]  PCP:  Tasha Mclean, MD    Chief Complaint: Diabetes and Ingrown Toenail (Right great toe, redness)   History of Present Illness:  Tasha Tucker is a 70 y.o. very pleasant female patient who presents with the following:  Patient here today for a follow-up visit Last seen by myself August 2021 She was diagnosed with breast cancer last year, oncologist is Dr. Danae Tucker recent visit in December Per notes, she is tolerating chemotherapy very well  Also with history of diabetes, hyperlipidemia, stage IV chronic kidney disease, renal and bladder cancer, cerebral aneurysm status post repair, hypertension She had a left nephrectomy in 2018, brain aneurysm clipping in 1990 Nephrologist- Tasha Tucker, seen in January Urologist-Tasha Tucker- pt reports she was seen in the last couple of weeks and had cysto which looked ok  Endocrinologist- Tasha Tucker, seen in January- 7.3% 2 weeks ago per her report  She also notes a possible infection of her right great toenail- she thinks this may be an ingrown   Also, she has recently noted that she may feel lightheaded especially in the morning.  She wonders if her blood pressure is low  Prior to starting chemotherapy she was able to take a long awaited trip to [facility] and [place] which she really enjoyed  Foot exam can be updated COVID-19 booster is done Flu vaccine done Shingrix complete  Can update A1c for today-however, I think endocrinology may now be managing her diabetes  BP Readings from Last 3 Encounters:  10/31/20 139/71  10/28/20 134/70  10/11/20 (!) 131/50     Lab Results  Component Value Date   HGBA1C 7.8 (H) 06/17/2020     Patient Active Problem List   Diagnosis Date Noted  . Port-A-Cath in place  07/11/2020  . Malignant neoplasm of upper-outer quadrant of right breast in female, estrogen receptor positive ([place]) 03/28/2020  . Controlled type 2 diabetes mellitus with complication, with long-term current use of insulin ([place]) 03/19/2018  . Hyperlipidemia 01/12/2018  . Cerebral aneurysm 12/29/2017  . Secondary hypertension, unspecified 12/29/2017  . CKD (chronic kidney disease) stage 4, GFR 15-29 ml/min (HCC) 12/29/2017  . Cancer of renal pelvis, left ([place]) 10/12/2016  . Renal mass 10/09/2016    Past Medical History:  Diagnosis Date  . Bladder cancer ([place])    [place]  . Breast cancer ([place])   . DJD (degenerative joint disease)    NECK, NEEDS PILLOW WHEN LYING FLAT IS POSSIBLE  . History of cerebral aneurysm repair    1996-- S/P CLAPPING RIGHT CAROTID --- NO DEFICITS  . 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 Hyperlipidemia   . Hypertension   . Liver hemangioma   . OA (osteoarthritis)   . PAC (premature atrial contraction)   . PONV (postoperative nausea and vomiting)   . Right trigger finger   . Type 2 diabetes mellitus ([place])   . Wears glasses     Past Surgical History:  Procedure Laterality Date  . ABDOMINAL HYSTERECTOMY  1990   PARTIAL  . BREAST LUMPECTOMY WITH RADIOACTIVE SEED AND SENTINEL LYMPH NODE BIOPSY Right 04/17/2020   Procedure: BREAST LUMPECTOMY X 2  WITH RADIOACTIVE  SEED AND SENTINEL LYMPH NODE BIOPSY;  Surgeon: Coralie Keens, MD;  Location: [place];  Service: General;  Laterality: Right;  . CATARACT EXTRACTION W/ INTRAOCULAR LENS  IMPLANT, BILATERAL  2016; 2017  . CEREBRAL ANEURYSM REPAIR  1996   clipped-no deficits RIGHT CAROTID  . CHOLECYSTECTOMY  2010  . CYSTOSCOPY W/ RETROGRADES Right 06/09/2017   Procedure: CYSTOSCOPY WITH RETROGRADE PYELOGRAM;  Surgeon: Alexis Frock, MD;  Location: [facility];  Service: Urology;  Laterality: Right;   . CYSTOSCOPY W/ RETROGRADES Right 07/21/2017   Procedure: CYSTOSCOPY WITH RETROGRADE PYELOGRAM;  Surgeon: Alexis Frock, MD;  Location: [facility];  Service: Urology;  Laterality: Right;  . CYSTOSCOPY WITH RETROGRADE PYELOGRAM, URETEROSCOPY AND STENT PLACEMENT Left 08/26/2016   Procedure: CYSTOSCOPY WITH BILATERAL RETROGRADE PYELOGRAM,LEFT URETEROSCOPY WITH BIOPSY AND LEFT STENT PLACEMENT;  Surgeon: Alexis Frock, MD;  Location: [facility];  Service: Urology;  Laterality: Left;  . PORTACATH PLACEMENT Left 06/18/2020   Procedure: INSERTION PORT-A-CATH WITH ULTRASOUND GUIDANCE;  Surgeon: Coralie Keens, MD;  Location: [facility];  Service: General;  Laterality: Left;  . ROBOT ASSITED LAPAROSCOPIC NEPHROURETERECTOMY Left 10/09/2016   Procedure: XI ROBOT ASSITED LAPAROSCOPIC NEPHROURETERECTOMY;  Surgeon: Alexis Frock, MD;  [facility];  Service: Urology;  Laterality: Left;  . SURGERY ON FOOT FOR STAPH INFECTION  1989  . TRANSURETHRAL RESECTION OF BLADDER TUMOR N/A 07/21/2017   Procedure: TRANSURETHRAL RESECTION OF BLADDER TUMOR (TURBT);  Surgeon: Alexis Frock, MD;  Location: [facility];  Service: Urology;  Laterality: N/A;  . TRANSURETHRAL RESECTION OF BLADDER TUMOR WITH MITOMYCIN-C N/A 06/09/2017   Procedure: TRANSURETHRAL RESECTION OF BLADDER TUMOR WITH MITOMYCIN-C;  Surgeon: Alexis Frock, MD;  Location: [facility];  Service: Urology;  Laterality: N/A;  . TUBAL LIGATION  1980    Social History   Tobacco Use  . Smoking status: Never Smoker  . Smokeless tobacco: Never Used  Vaping Use  . Vaping Use: Never used  Substance Use Topics  . Alcohol use: Not Currently    Comment: , STOPPED  . Drug use: No    Family History  Problem Relation Age of Onset  . Cancer Mother   . Hyperlipidemia Father   . Heart attack Father   . Diabetes Brother   . Breast cancer Neg Hx     Allergies  Allergen Reactions  . Other Other  (See Comments)  . Adhesive [Tape] Rash    PAPER TAPE ONLY  . Chlorhexidine Gluconate Itching and Rash    Topical antiseptic (CHG WIPES)   . Dilantin [Phenytoin] Rash  . Penicillins Rash    Has patient had a PCN reaction causing immediate rash, facial/tongue/throat swelling, SOB or lightheadedness with hypotension:unsure Has patient had a PCN reaction causing severe rash involving mucus membranes or skin necrosis:unsure Has patient had a PCN reaction that required hospitalization:No Has patient had a PCN reaction occurring within the last 10 years:No If all of the above answers are "NO", then may proceed with Cephalosporin use.   . Sulfa Antibiotics Rash    Medication list has been reviewed and updated.  Current Outpatient Medications on File Prior to Visit  Medication Sig Dispense Refill  . acetaminophen (TYLENOL) 500 MG tablet Take 500 mg by mouth at bedtime.     Marland Kitchen anastrozole (ARIMIDEX) 1 MG tablet Take 1 tablet (1 mg total) by mouth daily. 90 tablet 4  . BD PEN NEEDLE NANO U/F 32G X 4 MM MISC USE FOR INSULIN ADMINISTRATION DAILY 100  each 3  . carvedilol (COREG) 6.25 MG tablet Take 1 tablet (6.25 mg total) by mouth 2 (two) times daily. 180 tablet 3  . cetirizine (ZYRTEC) 10 MG tablet Take 10 mg by mouth every morning.    Marland Kitchen HUMALOG KWIKPEN 100 UNIT/ML KwikPen Inject 5-6 Units into the skin 3 (three) times daily.     . insulin glargine (LANTUS SOLOSTAR) 100 UNIT/ML Solostar Pen Inject 18 Units into the skin daily.    Marland Kitchen lidocaine-prilocaine (EMLA) cream Apply to affected area once 30 g 3  . ONETOUCH DELICA LANCETS 52D MISC Use lancet to stick finger and check blood sugar two times daily. 100 each 6  . ONETOUCH VERIO test strip USE AS DIRECTED. 300 each 2  . pravastatin (PRAVACHOL) 40 MG tablet Take 1 tablet (40 mg total) by mouth daily. 90 tablet 3  . prochlorperazine (COMPAZINE) 10 MG tablet Take 1 tablet (10 mg total) by mouth every 6 (six) hours as needed (Nausea or vomiting). 30  tablet 1   No current facility-administered medications on file prior to visit.    Review of Systems:  As per HPI- otherwise negative.   Physical Examination: Vitals:   10/31/20 0847  Resp: 17  SpO2: 99%   Vitals:   10/31/20 0847  Weight: 224 lb (101.6 kg)  Height: 5\' 8"  (1.727 m)   Body mass index is 34.06 kg/m. Ideal Body Weight: Weight in (lb) to have BMI = 25: 164.1  GEN: no acute distress.  Overweight, looks well.  Patient has lost her hair and is wearing a wig due to chemotherapy HEENT: Atraumatic, Normocephalic.  Ears and Nose: No external deformity. CV: RRR, No M/G/R. No JVD. No thrill. No extra heart sounds. PULM: CTA B, no wheezes, crackles, rhonchi. No retractions. No resp. distress. No accessory muscle use. ABD: S, NT, ND, +BS. No rebound. No HSM. EXTR: No c/c/e PSYCH: Normally interactive. Conversant.  Right great toe shows evidence of mild paronychia and ingrown nail -no apparent pus needing drainage  Assessment and Plan: Secondary hypertension, unspecified  Malignant neoplasm of upper-outer quadrant of right breast in female, estrogen receptor positive ([place])  Stage 3 chronic kidney disease, unspecified whether stage 3a or 3b CKD ([place])  Mixed hyperlipidemia  Controlled type 2 diabetes mellitus with complication, with long-term current use of insulin (HCC)  Ingrowing right great toenail - Plan: doxycycline (VIBRAMYCIN) 100 MG capsule  Lightheaded  Patient today for periodic follow-up visit.  She is followed by several specialists including nephrology, urology, hematology She has prior history of bladder and renal cancer, is currently dealing with breast cancer.  She is doing chemotherapy right now, per her report radiation will also be necessary. Her main concern for me today is possible ingrown toenail.  She notes pain at the lateral nail fold of her right great toenail.  I do not see evidence of any drainable pus, but there is redness and tenderness.   We will treat her with doxycycline Her diabetes is being managed by endocrinology, she notes that her most recent A1c showed improvement  She also has concern of feeling lightheaded especially in the mornings.  This will come and go.  She does not describe vertigo.  Orthostatic testing does show significant drop in blood pressure when standing.  I encouraged her to increase fluid and sodium intake, change positions gradually- she will let us know if is not improving We went over her various health problems today and make sure everything is organized and up-to-date.  She  will let me know if her toes not feeling better soon.  Otherwise plan to visit in 6 months This visit occurred during the SARS-CoV-2 public health emergency.  Safety protocols were in place, including screening questions prior to the visit, additional usage of staff PPE, and extensive cleaning of exam room while observing appropriate contact time as indicated for disinfecting solutions.    Signed Lamar Blinks, MD

## 2020-10-28 ENCOUNTER — Ambulatory Visit: Payer: Medicare Other | Admitting: Adult Health

## 2020-10-28 ENCOUNTER — Inpatient Hospital Stay: Payer: Medicare Other

## 2020-10-28 ENCOUNTER — Inpatient Hospital Stay (HOSPITAL_BASED_OUTPATIENT_CLINIC_OR_DEPARTMENT_OTHER): Payer: Medicare Other | Admitting: Medical

## 2020-10-28 ENCOUNTER — Other Ambulatory Visit: Payer: Medicare Other

## 2020-10-28 ENCOUNTER — Other Ambulatory Visit: Payer: Self-pay

## 2020-10-28 ENCOUNTER — Ambulatory Visit: Payer: Medicare Other

## 2020-10-28 VITALS — Temp 98.4°F

## 2020-10-28 VITALS — BP 134/70 | HR 62 | Resp 17 | Wt 225.5 lb

## 2020-10-28 DIAGNOSIS — C50411 Malignant neoplasm of upper-outer quadrant of right female breast: Secondary | ICD-10-CM | POA: Diagnosis not present

## 2020-10-28 DIAGNOSIS — Z95828 Presence of other vascular implants and grafts: Secondary | ICD-10-CM

## 2020-10-28 DIAGNOSIS — C652 Malignant neoplasm of left renal pelvis: Secondary | ICD-10-CM

## 2020-10-28 DIAGNOSIS — Z170 Estrogen receptor positive status [ER+]: Secondary | ICD-10-CM | POA: Diagnosis not present

## 2020-10-28 DIAGNOSIS — N184 Chronic kidney disease, stage 4 (severe): Secondary | ICD-10-CM

## 2020-10-28 DIAGNOSIS — Z794 Long term (current) use of insulin: Secondary | ICD-10-CM

## 2020-10-28 LAB — COMPREHENSIVE METABOLIC PANEL
ALT: 29 U/L (ref 0–44)
AST: 26 U/L (ref 15–41)
Albumin: 3.3 g/dL — ABNORMAL LOW (ref 3.5–5.0)
Alkaline Phosphatase: 111 U/L (ref 38–126)
Anion gap: 9 (ref 5–15)
BUN: 35 mg/dL — ABNORMAL HIGH (ref 8–23)
CO2: 20 mmol/L — ABNORMAL LOW (ref 22–32)
Calcium: 9.2 mg/dL (ref 8.9–10.3)
Chloride: 107 mmol/L (ref 98–111)
Creatinine, Ser: 1.73 mg/dL — ABNORMAL HIGH (ref 0.44–1.00)
GFR, Estimated: 31 mL/min — ABNORMAL LOW (ref >60)
Glucose, Bld: 188 mg/dL — ABNORMAL HIGH (ref 70–99)
Potassium: 4.5 mmol/L (ref 3.5–5.1)
Sodium: 136 mmol/L (ref 135–145)
Total Bilirubin: 0.4 mg/dL (ref 0.3–1.2)
Total Protein: 6.8 g/dL (ref 6.5–8.1)

## 2020-10-28 LAB — CBC WITH DIFFERENTIAL/PLATELET
Abs Immature Granulocytes: 0.09 10*3/uL — ABNORMAL HIGH (ref 0.00–0.07)
Basophils Absolute: 0.1 10*3/uL (ref 0.0–0.1)
Basophils Relative: 1 %
Eosinophils Absolute: 0.2 10*3/uL (ref 0.0–0.5)
Eosinophils Relative: 2 %
HCT: 37.3 % (ref 36.0–46.0)
Hemoglobin: 12.7 g/dL (ref 12.0–15.0)
Immature Granulocytes: 1 %
Lymphocytes Relative: 9 %
Lymphs Abs: 0.9 10*3/uL (ref 0.7–4.0)
MCH: 33.3 pg (ref 26.0–34.0)
MCHC: 34 g/dL (ref 30.0–36.0)
MCV: 97.9 fL (ref 80.0–100.0)
Monocytes Absolute: 0.6 10*3/uL (ref 0.1–1.0)
Monocytes Relative: 6 %
Neutro Abs: 8.4 10*3/uL — ABNORMAL HIGH (ref 1.7–7.7)
Neutrophils Relative %: 81 %
Platelets: 122 10*3/uL — ABNORMAL LOW (ref 150–400)
RBC: 3.81 MIL/uL — ABNORMAL LOW (ref 3.87–5.11)
RDW: 14.6 % (ref 11.5–15.5)
WBC: 10.3 10*3/uL (ref 4.0–10.5)
nRBC: 0 % (ref 0.0–0.2)

## 2020-10-28 MED ORDER — PROCHLORPERAZINE EDISYLATE 10 MG/2ML IJ SOLN
INTRAMUSCULAR | Status: AC
  Filled 2020-10-28: qty 2

## 2020-10-28 MED ORDER — SODIUM CHLORIDE 0.9% FLUSH
10.0000 mL | INTRAVENOUS | Status: DC | PRN
  Administered 2020-10-28: 10 mL
  Filled 2020-10-28: qty 10

## 2020-10-28 MED ORDER — SODIUM CHLORIDE 0.9 % IV SOLN
Freq: Once | INTRAVENOUS | Status: AC
  Filled 2020-10-28: qty 250

## 2020-10-28 MED ORDER — METHOTREXATE SODIUM (PF) CHEMO INJECTION 250 MG/10ML
30.0000 mg/m2 | Freq: Once | INTRAMUSCULAR | Status: AC
  Administered 2020-10-28: 67 mg via INTRAVENOUS
  Filled 2020-10-28: qty 2.68

## 2020-10-28 MED ORDER — PROCHLORPERAZINE EDISYLATE 10 MG/2ML IJ SOLN
10.0000 mg | Freq: Once | INTRAMUSCULAR | Status: AC
  Administered 2020-10-28: 10 mg via INTRAVENOUS

## 2020-10-28 MED ORDER — SODIUM CHLORIDE 0.9 % IV SOLN
500.0000 mg/m2 | Freq: Once | INTRAVENOUS | Status: AC
  Administered 2020-10-28: 1120 mg via INTRAVENOUS
  Filled 2020-10-28: qty 56

## 2020-10-28 MED ORDER — HEPARIN SOD (PORK) LOCK FLUSH 100 UNIT/ML IV SOLN
500.0000 [IU] | Freq: Once | INTRAVENOUS | Status: AC | PRN
  Administered 2020-10-28: 500 [IU]
  Filled 2020-10-28: qty 5

## 2020-10-28 MED ORDER — FOSAPREPITANT DIMEGLUMINE INJECTION 150 MG
150.0000 mg | Freq: Once | INTRAVENOUS | Status: AC
  Administered 2020-10-28: 150 mg via INTRAVENOUS
  Filled 2020-10-28: qty 150

## 2020-10-28 MED ORDER — PALONOSETRON HCL INJECTION 0.25 MG/5ML
0.2500 mg | Freq: Once | INTRAVENOUS | Status: AC
  Administered 2020-10-28: 0.25 mg via INTRAVENOUS

## 2020-10-28 MED ORDER — PROCHLORPERAZINE MALEATE 10 MG PO TABS
ORAL_TABLET | ORAL | Status: AC
  Filled 2020-10-28: qty 1

## 2020-10-28 MED ORDER — FLUOROURACIL CHEMO INJECTION 2.5 GM/50ML
600.0000 mg/m2 | Freq: Once | INTRAVENOUS | Status: AC
  Administered 2020-10-28: 1350 mg via INTRAVENOUS
  Filled 2020-10-28: qty 27

## 2020-10-28 MED ORDER — PALONOSETRON HCL INJECTION 0.25 MG/5ML
INTRAVENOUS | Status: AC
  Filled 2020-10-28: qty 5

## 2020-10-28 NOTE — Progress Notes (Signed)
[facility] Progress Note   Tasha Tucker [id number] 1951-07-06 70 y.o.  Tasha Tucker is managed by Dr. Lurline Del  Actively treated with chemotherapy/immunotherapy/hormonal therapy: yes  Current therapy: CMF with pegfilgrastim support  Last treated: 10/08/2020 (cycle 6, day 1)  Next scheduled appointment with provider: 11/18/2020  Assessment: Plan:    Malignant neoplasm of upper-outer quadrant of right breast in female, estrogen receptor positive ([facility])   ER positive malignant neoplasm of the right breast: The patient presents to the clinic today for consideration of cycle #7, day 1 of CMF with pegfilgrastim support.  We will proceed with her treatment today and will have her return to the clinic for her next treatment on 11/18/2020.  She reports that she has had no call yet from radiation oncology to schedule an appointment.  Please see After Visit Summary for patient specific instructions.  Future Appointments  Date Time Provider [place]  10/31/2020  9:00 AM Copland, Gay Filler, MD [facility]  10/31/2020 11:45 AM [facility] [place] FLUSH [facility] None  11/18/2020  9:15 AM [facility] LAB [facility] None  11/18/2020  9:30 AM [facility] INFUSION [facility] None  11/18/2020 10:00 AM Magrinat, Virgie Dad, MD [facility] None  11/18/2020 11:00 AM [facility] INFUSION [facility] None  01/20/2021  9:15 AM Suanne Marker, PTA [facility] None    No orders of the defined types were placed in this encounter.      Subjective:   Patient ID:  Tasha Tucker is a 70 y.o. (DOB 02-12-1951) female.  Chief Complaint: No chief complaint on file.   HPI Tasha Tucker  is a 70 y.o. female with a diagnosis of an ER positive malignant neoplasm of the right breast.  She is managed by Dr. Jana Hakim and presents to the clinic today for consideration of cycle #7, day 1 of CMF with pegfilgrastim support.  She is concerned that she has not received a call from  radiation oncology to schedule an appointment.  She denies any issues or concerns today.  She denies fevers, chills, sweats, nausea, vomiting, headaches, or acute pain.  Medications: I have reviewed the patient's current medications.  Allergies:  Allergies  Allergen Reactions  . Adhesive [Tape] Rash    PAPER TAPE ONLY  . Chlorhexidine Gluconate Itching and Rash    Topical antiseptic (CHG WIPES)   . Dilantin [Phenytoin] Rash  . Penicillins Rash    Has patient had a PCN reaction causing immediate rash, facial/tongue/throat swelling, SOB or lightheadedness with hypotension:unsure Has patient had a PCN reaction causing severe rash involving mucus membranes or skin necrosis:unsure Has patient had a PCN reaction that required hospitalization:No Has patient had a PCN reaction occurring within the last 10 years:No If all of the above answers are "NO", then may proceed with Cephalosporin use.   . Sulfa Antibiotics Rash    Past Medical History:  Diagnosis Date  . Bladder cancer ([place])    [place]  . Breast cancer ([place])   . DJD (degenerative joint disease)    NECK, NEEDS PILLOW WHEN LYING FLAT IS POSSIBLE  . History of cerebral aneurysm repair    1996-- S/P CLAPPING RIGHT CAROTID --- NO DEFICITS  . 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 Hyperlipidemia   . Hypertension   . Liver hemangioma   . OA (osteoarthritis)   . PAC (premature atrial contraction)   . PONV (postoperative nausea  and vomiting)   . Right trigger finger   . Type 2 diabetes mellitus ([place])   . Wears glasses     Past Surgical History:  Procedure Laterality Date  . ABDOMINAL HYSTERECTOMY  1990   PARTIAL  . BREAST LUMPECTOMY WITH RADIOACTIVE SEED AND SENTINEL LYMPH NODE BIOPSY Right 04/17/2020   Procedure: BREAST LUMPECTOMY X 2  WITH RADIOACTIVE SEED AND SENTINEL LYMPH NODE BIOPSY;  Surgeon: Coralie Keens, MD;   Location: [facility];  Service: General;  Laterality: Right;  . CATARACT EXTRACTION W/ INTRAOCULAR LENS  IMPLANT, BILATERAL  2016; 2017  . CEREBRAL ANEURYSM REPAIR  1996   clipped-no deficits RIGHT CAROTID  . CHOLECYSTECTOMY  2010  . CYSTOSCOPY W/ RETROGRADES Right 06/09/2017   Procedure: CYSTOSCOPY WITH RETROGRADE PYELOGRAM;  Surgeon: Alexis Frock, MD;  Location: [facility];  Service: Urology;  Laterality: Right;  . CYSTOSCOPY W/ RETROGRADES Right 07/21/2017   Procedure: CYSTOSCOPY WITH RETROGRADE PYELOGRAM;  Surgeon: Alexis Frock, MD;  Location: [facility];  Service: Urology;  Laterality: Right;  . CYSTOSCOPY WITH RETROGRADE PYELOGRAM, URETEROSCOPY AND STENT PLACEMENT Left 08/26/2016   Procedure: CYSTOSCOPY WITH BILATERAL RETROGRADE PYELOGRAM,LEFT URETEROSCOPY WITH BIOPSY AND LEFT STENT PLACEMENT;  Surgeon: Alexis Frock, MD;  Location: [facility];  Service: Urology;  Laterality: Left;  . PORTACATH PLACEMENT Left 06/18/2020   Procedure: INSERTION PORT-A-CATH WITH ULTRASOUND GUIDANCE;  Surgeon: Coralie Keens, MD;  Location: [facility];  Service: General;  Laterality: Left;  . ROBOT ASSITED LAPAROSCOPIC NEPHROURETERECTOMY Left 10/09/2016   Procedure: XI ROBOT ASSITED LAPAROSCOPIC NEPHROURETERECTOMY;  Surgeon: Alexis Frock, MD;  Location: [facility];  Service: Urology;  Laterality: Left;  . SURGERY ON FOOT FOR STAPH INFECTION  1989  . TRANSURETHRAL RESECTION OF BLADDER TUMOR N/A 07/21/2017   Procedure: TRANSURETHRAL RESECTION OF BLADDER TUMOR (TURBT);  Surgeon: Alexis Frock, MD;  Location: [facility];  Service: Urology;  Laterality: N/A;  . TRANSURETHRAL RESECTION OF BLADDER TUMOR WITH MITOMYCIN-C N/A 06/09/2017   Procedure: TRANSURETHRAL RESECTION OF BLADDER TUMOR WITH MITOMYCIN-C;  Surgeon: Alexis Frock, MD;  Location: [facility];  Service: Urology;  Laterality: N/A;  . TUBAL LIGATION  1980    Family History   Problem Relation Age of Onset  . Cancer Mother   . Hyperlipidemia Father   . Heart attack Father   . Diabetes Brother   . Breast cancer Neg Hx     Social History   Socioeconomic History  . Marital status: Married    Spouse name: Not on file  . Number of children: Not on file  . Years of education: Not on file  . Highest education level: Not on file  Occupational History  . Not on file  Tobacco Use  . Smoking status: Never Smoker  . Smokeless tobacco: Never Used  Vaping Use  . Vaping Use: Never used  Substance and Sexual Activity  . Alcohol use: Not Currently    Comment: , STOPPED  . Drug use: No  . Sexual activity: Yes  Other Topics Concern  . Not on file  Social History Narrative  . Not on file   Social Determinants of Health   Financial Resource Strain: Low Risk   . Difficulty of Paying Living Expenses: Not hard at all  Food Insecurity: No Food Insecurity  . Worried About Charity fundraiser in the Last Year: Never true  . Ran Out of Food in the Last Year: Never true  Transportation Needs: No Transportation Needs  .  Lack of Transportation (Medical): No  . Lack of Transportation (Non-Medical): No  Physical Activity: Not on file  Stress: Not on file  Social Connections: Not on file  Intimate Partner Violence: Not on file    Past Medical History, Surgical history, Social history, and Family history were reviewed and updated as appropriate.   Please see review of systems for further details on the patient's review from today.   Review of Systems:  Review of Systems  Constitutional: Negative for chills, diaphoresis and fever.  HENT: Negative for trouble swallowing and voice change.   Respiratory: Negative for cough, chest tightness, shortness of breath and wheezing.   Cardiovascular: Negative for chest pain and palpitations.  Gastrointestinal: Negative for abdominal pain, constipation, diarrhea, nausea and vomiting.  Musculoskeletal: Negative for back pain  and myalgias.  Neurological: Negative for dizziness, light-headedness and headaches.    Objective:   Physical Exam:  BP 134/70   Pulse 62   Resp 17   Wt 225 lb 8 oz (102.3 kg)   SpO2 100%   BMI 34.29 kg/m  ECOG: 0  Physical Exam Constitutional:      General: She is not in acute distress.    Appearance: She is not diaphoretic.  HENT:     Head: Normocephalic and atraumatic.  Eyes:     General: No scleral icterus.       Right eye: No discharge.        Left eye: No discharge.     Conjunctiva/sclera: Conjunctivae normal.  Cardiovascular:     Rate and Rhythm: Normal rate and regular rhythm.     Heart sounds: Normal heart sounds. No murmur heard. No friction rub. No gallop.   Pulmonary:     Effort: Pulmonary effort is normal. No respiratory distress.     Breath sounds: Normal breath sounds. No wheezing or rales.  Skin:    General: Skin is warm and dry.     Findings: No erythema or rash.  Neurological:     Mental Status: She is alert.     Coordination: Coordination normal.     Gait: Gait normal.  Psychiatric:        Mood and Affect: Mood normal.        Behavior: Behavior normal.        Thought Content: Thought content normal.        Judgment: Judgment normal.     Lab Review:     Component Value Date/Time   NA 136 10/28/2020 0912   NA 140 04/21/2019 0000   K 4.5 10/28/2020 0912   CL 107 10/28/2020 0912   CO2 20 (L) 10/28/2020 0912   GLUCOSE 188 (H) 10/28/2020 0912   BUN 35 (H) 10/28/2020 0912   BUN 36 (A) 08/30/2017 0000   CREATININE 1.73 (H) 10/28/2020 0912   CREATININE 1.78 (H) 06/19/2020 0842   CALCIUM 9.2 10/28/2020 0912   PROT 6.8 10/28/2020 0912   ALBUMIN 3.3 (L) 10/28/2020 0912   AST 26 10/28/2020 0912   AST 24 06/19/2020 0842   ALT 29 10/28/2020 0912   ALT 30 06/19/2020 0842   ALKPHOS 111 10/28/2020 0912   BILITOT 0.4 10/28/2020 0912   BILITOT 0.5 06/19/2020 0842   GFRNONAA 31 (L) 10/28/2020 0912   GFRNONAA 28 (L) 06/19/2020 0842   GFRAA 34 (L)  06/26/2020 1535   GFRAA 33 (L) 06/19/2020 0842       Component Value Date/Time   WBC 10.3 10/28/2020 0912   RBC 3.81 (L) 10/28/2020 0912  HGB 12.7 10/28/2020 0912   HGB 13.6 06/19/2020 0842   HCT 37.3 10/28/2020 0912   PLT 122 (L) 10/28/2020 0912   PLT 141 (L) 06/19/2020 0842   MCV 97.9 10/28/2020 0912   MCH 33.3 10/28/2020 0912   MCHC 34.0 10/28/2020 0912   RDW 14.6 10/28/2020 0912   LYMPHSABS 0.9 10/28/2020 0912   MONOABS 0.6 10/28/2020 0912   EOSABS 0.2 10/28/2020 0912   BASOSABS 0.1 10/28/2020 0912   -------------------------------  Imaging from last 24 hours (if applicable):  Radiology interpretation: No results found.    OK to treat.  Sandi Mealy, MHS, PA-C Physician Assistant

## 2020-10-28 NOTE — Patient Instructions (Signed)
[facility] Discharge Instructions for Patients Receiving Chemotherapy  Today you received the following chemotherapy agents: Cytoxan/Methotrexate/5FU.  To help prevent nausea and vomiting after your treatment, we encourage you to take your nausea medication as directed.   If you develop nausea and vomiting that is not controlled by your nausea medication, call the clinic.   BELOW ARE SYMPTOMS THAT SHOULD BE REPORTED IMMEDIATELY:  *FEVER GREATER THAN 100.5 F  *CHILLS WITH OR WITHOUT FEVER  NAUSEA AND VOMITING THAT IS NOT CONTROLLED WITH YOUR NAUSEA MEDICATION  *UNUSUAL SHORTNESS OF BREATH  *UNUSUAL BRUISING OR BLEEDING  TENDERNESS IN MOUTH AND THROAT WITH OR WITHOUT PRESENCE OF ULCERS  *URINARY PROBLEMS  *BOWEL PROBLEMS  UNUSUAL RASH Items with * indicate a potential emergency and should be followed up as soon as possible.  Feel free to call the clinic should you have any questions or concerns. The clinic phone number is [phone].  Please show the [place] at check-in to the Emergency Department and triage nurse.

## 2020-10-28 NOTE — Patient Instructions (Signed)

## 2020-10-28 NOTE — Progress Notes (Unsigned)
Patient seen and assessed by Van Tanner, PA. 

## 2020-10-28 NOTE — Progress Notes (Signed)
Pt ok to treat with Scr of 1.73 per Tora Duck, MD.

## 2020-10-29 ENCOUNTER — Other Ambulatory Visit: Payer: Medicare Other

## 2020-10-29 ENCOUNTER — Ambulatory Visit: Payer: Medicare Other

## 2020-10-29 ENCOUNTER — Ambulatory Visit: Payer: Medicare Other | Admitting: Adult Health

## 2020-10-30 ENCOUNTER — Encounter: Payer: Self-pay | Admitting: *Deleted

## 2020-10-30 DIAGNOSIS — C50411 Malignant neoplasm of upper-outer quadrant of right female breast: Secondary | ICD-10-CM

## 2020-10-30 DIAGNOSIS — Z170 Estrogen receptor positive status [ER+]: Secondary | ICD-10-CM

## 2020-10-30 NOTE — Patient Instructions (Incomplete)
Good to see you again today  Please use doxycycline for 7- 10 days for your toenail.  Let me know if not improving Your BP did drop when you stood up- this may be why you are getting the dizzy spells.  Please do up your fluid intake.  You might also want to increase sodium intake to help increase your BP a bit  Please plan to see me in about 6 months, let me know if you need anything in the meantime!

## 2020-10-31 ENCOUNTER — Other Ambulatory Visit: Payer: Self-pay

## 2020-10-31 ENCOUNTER — Inpatient Hospital Stay: Payer: Medicare Other | Attending: Oncology

## 2020-10-31 ENCOUNTER — Ambulatory Visit (INDEPENDENT_AMBULATORY_CARE_PROVIDER_SITE_OTHER): Payer: Medicare Other | Admitting: Family Medicine

## 2020-10-31 ENCOUNTER — Encounter: Payer: Self-pay | Admitting: Family Medicine

## 2020-10-31 VITALS — Resp 17 | Ht 68.0 in | Wt 224.0 lb

## 2020-10-31 VITALS — BP 139/71 | HR 72 | Resp 18

## 2020-10-31 DIAGNOSIS — C50411 Malignant neoplasm of upper-outer quadrant of right female breast: Secondary | ICD-10-CM | POA: Diagnosis not present

## 2020-10-31 DIAGNOSIS — Z8553 Personal history of malignant neoplasm of renal pelvis: Secondary | ICD-10-CM | POA: Insufficient documentation

## 2020-10-31 DIAGNOSIS — Z79811 Long term (current) use of aromatase inhibitors: Secondary | ICD-10-CM | POA: Diagnosis not present

## 2020-10-31 DIAGNOSIS — I10 Essential (primary) hypertension: Secondary | ICD-10-CM | POA: Insufficient documentation

## 2020-10-31 DIAGNOSIS — E782 Mixed hyperlipidemia: Secondary | ICD-10-CM

## 2020-10-31 DIAGNOSIS — Z170 Estrogen receptor positive status [ER+]: Secondary | ICD-10-CM | POA: Insufficient documentation

## 2020-10-31 DIAGNOSIS — N184 Chronic kidney disease, stage 4 (severe): Secondary | ICD-10-CM

## 2020-10-31 DIAGNOSIS — L600 Ingrowing nail: Secondary | ICD-10-CM

## 2020-10-31 DIAGNOSIS — N183 Chronic kidney disease, stage 3 unspecified: Secondary | ICD-10-CM | POA: Diagnosis not present

## 2020-10-31 DIAGNOSIS — Z5189 Encounter for other specified aftercare: Secondary | ICD-10-CM | POA: Insufficient documentation

## 2020-10-31 DIAGNOSIS — Z794 Long term (current) use of insulin: Secondary | ICD-10-CM

## 2020-10-31 DIAGNOSIS — Z79899 Other long term (current) drug therapy: Secondary | ICD-10-CM | POA: Diagnosis not present

## 2020-10-31 DIAGNOSIS — E785 Hyperlipidemia, unspecified: Secondary | ICD-10-CM | POA: Diagnosis not present

## 2020-10-31 DIAGNOSIS — Z5111 Encounter for antineoplastic chemotherapy: Secondary | ICD-10-CM | POA: Diagnosis not present

## 2020-10-31 DIAGNOSIS — E118 Type 2 diabetes mellitus with unspecified complications: Secondary | ICD-10-CM

## 2020-10-31 DIAGNOSIS — I159 Secondary hypertension, unspecified: Secondary | ICD-10-CM

## 2020-10-31 DIAGNOSIS — M1990 Unspecified osteoarthritis, unspecified site: Secondary | ICD-10-CM | POA: Diagnosis not present

## 2020-10-31 DIAGNOSIS — Z905 Acquired absence of kidney: Secondary | ICD-10-CM | POA: Insufficient documentation

## 2020-10-31 DIAGNOSIS — R42 Dizziness and giddiness: Secondary | ICD-10-CM

## 2020-10-31 MED ORDER — PEGFILGRASTIM-BMEZ 6 MG/0.6ML ~~LOC~~ SOSY
PREFILLED_SYRINGE | SUBCUTANEOUS | Status: AC
  Filled 2020-10-31: qty 0.6

## 2020-10-31 MED ORDER — PEGFILGRASTIM-BMEZ 6 MG/0.6ML ~~LOC~~ SOSY
6.0000 mg | PREFILLED_SYRINGE | Freq: Once | SUBCUTANEOUS | Status: AC
Start: 2020-10-31 — End: 2020-10-31
  Administered 2020-10-31: 6 mg via SUBCUTANEOUS

## 2020-10-31 MED ORDER — DOXYCYCLINE HYCLATE 100 MG PO CAPS: 100.0000 mg | ORAL_CAPSULE | Freq: Two times a day (BID) | ORAL | 0 refills | Status: DC

## 2020-11-01 ENCOUNTER — Ambulatory Visit: Payer: Medicare Other

## 2020-11-13 ENCOUNTER — Other Ambulatory Visit: Payer: Self-pay | Admitting: Family Medicine

## 2020-11-13 DIAGNOSIS — E782 Mixed hyperlipidemia: Secondary | ICD-10-CM

## 2020-11-17 NOTE — Progress Notes (Signed)
Tasha [facility]  [phone] [phone]     ID: Tasha [facility] DOB: September 05, 1951  MR#: [id number]  QHQ#:[id number]  Patient Care Team: [name], MD as PCP - General (Family Medicine) Sebastian Ache, MD as Consulting Physician (Urology) Donnelly Angelica, RN as Oncology Nurse Navigator Pershing Proud, RN as Oncology Nurse Navigator Abigail Miyamoto, MD as Consulting Physician (General Surgery) Hendrik Donath, Valentino Hue, MD as Consulting Physician (Oncology) Dorothy Puffer, MD as Consulting Physician (Radiation Oncology) Marlene Lard, MD as Consulting Physician (Endocrinology) Bufford Buttner, MD as Consulting Physician (Nephrology) Lowella Dell, MD OTHER MD:  CHIEF COMPLAINT: Estrogen receptor positive breast cancer  CURRENT TREATMENT: Completing adjuvant chemotherapy    INTERVAL HISTORY: [name] returns today for follow up and treatment of her estrogen receptor positive breast cancer.   She continues on adjuvant chemotherapy, consisting of cyclophosphamide, methotrexate and fluorouracil, began on 06/19/2020. Today is day 1 cycle 8.  Of note, she is followed by Dr. Berneice Heinrich for her history of urothelial carcinoma, s/p resection in 09/2016. Her most recent surveillance cystoscopy from 09/2020 showed no evidence of recurrence.  She has an appointment with radiation oncology 12/04/2020   REVIEW OF SYSTEMS: Tasha [facility] tells me looking back the worst part of her chemo was losing her hair.  She also had some mouth sores.  She tells me her hair was thin to begin with and she is worried about it coming back.  She is using Magic mouthwash for her mouth sores which happen with each treatment for a few days and then resolve.  She does not want to use any medication for that such as Valtrex because of concerns regarding her kidneys.  She tells me she is trying to drink a little bit more water but from her account she drinks much less than a quart daily.   COVID 19  VACCINATION STATUS: fully vaccinated [employer]), with booster 04/2020   HISTORY OF CURRENT ILLNESS: From the original intake note:  Tasha [facility] had routine screening mammography on 03/13/2020 showing a possible abnormality in the right breast. She underwent right diagnostic mammography with tomography and right breast ultrasonography at [facility] on 03/26/2020 showing: breast density category B; suspicious 1.9 cm mass/distortion in right breast at 9 o'clock; no right axillary lymphadenopathy.  Accordingly that same day, she proceeded to biopsy of the right breast area in question. The pathology from this procedure ([id number]) showed: invasive and in situ mammary carcinoma, grade 2, e-cadherin negative. Prognostic indicators significant for: estrogen receptor, 90% positive and progesterone receptor, 90% positive, both with strong staining intensity. Proliferation marker Ki67 at 5%. HER2 negative by immunohistochemistry (1+).  The patient's subsequent history is as detailed below.   PAST MEDICAL HISTORY: Past Medical History:  Diagnosis Date  . Bladder cancer (HCC)    UROLOGIST- [facility]  . Breast cancer (HCC)   . DJD (degenerative joint disease)    NECK, NEEDS PILLOW WHEN LYING FLAT IS POSSIBLE  . History of cerebral aneurysm repair    1996-- S/P CLAPPING RIGHT CAROTID --- NO DEFICITS  . 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 Hyperlipidemia   . Hypertension   . Liver hemangioma   . OA (osteoarthritis)   . PAC (premature atrial contraction)   . PONV (postoperative nausea and vomiting)   . Right trigger finger   . Type 2 diabetes mellitus (HCC)   . Wears glasses  PAST SURGICAL HISTORY: Past Surgical History:  Procedure Laterality Date  . ABDOMINAL HYSTERECTOMY  1990   PARTIAL  . BREAST LUMPECTOMY WITH RADIOACTIVE SEED AND SENTINEL LYMPH NODE BIOPSY Right 04/17/2020    Procedure: BREAST LUMPECTOMY X 2  WITH RADIOACTIVE SEED AND SENTINEL LYMPH NODE BIOPSY;  Surgeon: Coralie Keens, MD;  Location: [place];  Service: General;  Laterality: Right;  . CATARACT EXTRACTION W/ INTRAOCULAR LENS  IMPLANT, BILATERAL  2016; 2017  . CEREBRAL ANEURYSM REPAIR  1996   clipped-no deficits RIGHT CAROTID  . CHOLECYSTECTOMY  2010  . CYSTOSCOPY W/ RETROGRADES Right 06/09/2017   Procedure: CYSTOSCOPY WITH RETROGRADE PYELOGRAM;  Surgeon: Alexis Frock, MD;  Location: [facility];  Service: Urology;  Laterality: Right;  . CYSTOSCOPY W/ RETROGRADES Right 07/21/2017   Procedure: CYSTOSCOPY WITH RETROGRADE PYELOGRAM;  Surgeon: Alexis Frock, MD;  Location: [facility];  Service: Urology;  Laterality: Right;  . CYSTOSCOPY WITH RETROGRADE PYELOGRAM, URETEROSCOPY AND STENT PLACEMENT Left 08/26/2016   Procedure: CYSTOSCOPY WITH BILATERAL RETROGRADE PYELOGRAM,LEFT URETEROSCOPY WITH BIOPSY AND LEFT STENT PLACEMENT;  Surgeon: Alexis Frock, MD;  Location: [facility];  Service: Urology;  Laterality: Left;  . PORTACATH PLACEMENT Left 06/18/2020   Procedure: INSERTION PORT-A-CATH WITH ULTRASOUND GUIDANCE;  Surgeon: Coralie Keens, MD;  Location: [place];  Service: General;  Laterality: Left;  . ROBOT ASSITED LAPAROSCOPIC NEPHROURETERECTOMY Left 10/09/2016   Procedure: XI ROBOT ASSITED LAPAROSCOPIC NEPHROURETERECTOMY;  Surgeon: Alexis Frock, MD;  Location: [facility];  Service: Urology;  Laterality: Left;  . SURGERY ON FOOT FOR STAPH INFECTION  1989  . TRANSURETHRAL RESECTION OF BLADDER TUMOR N/A 07/21/2017   Procedure: TRANSURETHRAL RESECTION OF BLADDER TUMOR (TURBT);  Surgeon: Alexis Frock, MD;  Location[facility];  Service: Urology;  Laterality: N/A;  . TRANSURETHRAL RESECTION OF BLADDER TUMOR WITH MITOMYCIN-C N/A 06/09/2017   Procedure: TRANSURETHRAL RESECTION OF BLADDER TUMOR WITH MITOMYCIN-C;  Surgeon: Alexis Frock, MD;   Location: [facility];  Service: Urology;  Laterality: N/A;  . TUBAL LIGATION  1980    FAMILY HISTORY: Family History  Problem Relation Age of Onset  . Cancer Mother   . Hyperlipidemia Father   . Heart attack Father   . Diabetes Brother   . Breast cancer Neg Hx    Her father died at age 73 from ASCVD. Her mother died at age 18. She reports her mother had a tumor in her thigh at age 23, possibly a sarcoma. The patient has one brother, with no history of cancer. Her son, [name], had brain cancer (oligodedroglioma, possibly) and is doing well now at age 61 (as of 03/2020).   GYNECOLOGIC HISTORY:  No LMP recorded. Patient has had a hysterectomy. Menarche: 70 years old Age at first live birth: 70 years old [place] P 3 LMP 1990 Contraceptive: used pills for 8 years without issues HRT never used  Hysterectomy? Yes, 1990 BSO? 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 Husband Liliane Channel is a retired Garment/textile technologist for the [employer]. She lives at home with husband Liliane Channel and their cat, [place]water. Daughter Bryson Ha, age 11, works as a Careers adviser in [place]. Son [name], age 5, works in Human resources officer in [place]. Son Ruby Cola, age 53, lives in [place] and was previously a paramedic. Calaya has 4 grandchildren. She attends a [employer].     ADVANCED DIRECTIVES: In the absence of any documentation to the contrary, the patient's  spouse is their HCPOA.    HEALTH MAINTENANCE: Social History   Tobacco Use  . Smoking status: Never Smoker  . Smokeless tobacco: Never Used  Vaping Use  . Vaping Use: Never used  Substance Use Topics  . Alcohol use: Not Currently    Comment: , STOPPED  . Drug use: No     Colonoscopy: Cologuard 2020 was negative  PAP: date unknown, s/p hysterectomy  Bone density: 03/2018, +1.5   Allergies  Allergen Reactions  . Other Other (See Comments)  . Adhesive [Tape] Rash     PAPER TAPE ONLY  . Chlorhexidine Gluconate Itching and Rash    Topical antiseptic (CHG WIPES)   . Dilantin [Phenytoin] Rash  . Penicillins Rash    Has patient had a PCN reaction causing immediate rash, facial/tongue/throat swelling, SOB or lightheadedness with hypotension:unsure Has patient had a PCN reaction causing severe rash involving mucus membranes or skin necrosis:unsure Has patient had a PCN reaction that required hospitalization:No Has patient had a PCN reaction occurring within the last 10 years:No If all of the above answers are "NO", then may proceed with Cephalosporin use.   . Sulfa Antibiotics Rash    Current Outpatient Medications  Medication Sig Dispense Refill  . acetaminophen (TYLENOL) 500 MG tablet Take 500 mg by mouth at bedtime.     Marland Kitchen anastrozole (ARIMIDEX) 1 MG tablet Take 1 tablet (1 mg total) by mouth daily. 90 tablet 4  . BD PEN NEEDLE NANO U/F 32G X 4 MM MISC USE FOR INSULIN ADMINISTRATION DAILY 100 each 3  . carvedilol (COREG) 6.25 MG tablet Take 1 tablet (6.25 mg total) by mouth 2 (two) times daily. 180 tablet 3  . cetirizine (ZYRTEC) 10 MG tablet Take 10 mg by mouth every morning.    Marland Kitchen doxycycline (VIBRAMYCIN) 100 MG capsule Take 1 capsule (100 mg total) by mouth 2 (two) times daily. 20 capsule 0  . HUMALOG KWIKPEN 100 UNIT/ML KwikPen Inject 5-6 Units into the skin 3 (three) times daily.     . insulin glargine (LANTUS SOLOSTAR) 100 UNIT/ML Solostar Pen Inject 18 Units into the skin daily.    Marland Kitchen lidocaine-prilocaine (EMLA) cream Apply to affected area once 30 g 3  . ONETOUCH DELICA LANCETS 29F MISC Use lancet to stick finger and check blood sugar two times daily. 100 each 6  . ONETOUCH VERIO test strip USE AS DIRECTED. 300 each 2  . pravastatin (PRAVACHOL) 40 MG tablet TAKE 1 TABLET BY MOUTH EVERY DAY 90 tablet 3  . prochlorperazine (COMPAZINE) 10 MG tablet Take 1 tablet (10 mg total) by mouth every 6 (six) hours as needed (Nausea or vomiting). 30 tablet 1    No current facility-administered medications for this visit.    OBJECTIVE: White woman who appears stated age  70:   11/18/20 1000  BP: (!) 138/50  Pulse: 69  Resp: 18  Temp: 97.9 F (36.6 C)  SpO2: 99%     Body mass index is 34.13 kg/m.   Wt Readings from Last 3 Encounters:  11/18/20 224 lb 7.5 oz (101.8 kg)  10/31/20 224 lb (101.6 kg)  10/28/20 225 lb 8 oz (102.3 kg)      ECOG FS:1 - Symptomatic but completely ambulatory  Sclerae unicteric, EOMs intact Wearing a mask No cervical or supraclavicular adenopathy Lungs no rales or rhonchi Heart regular rate and rhythm Abd soft, nontender, positive bowel sounds MSK no focal spinal tenderness, no upper extremity lymphedema Neuro: nonfocal, well oriented, appropriate affect Breasts: Deferred  LAB RESULTS:  CMP     Component Value Date/Time   NA 136 10/28/2020 0912   NA 140 04/21/2019 0000   K 4.5 10/28/2020 0912   CL 107 10/28/2020 0912   CO2 20 (L) 10/28/2020 0912   GLUCOSE 188 (H) 10/28/2020 0912   BUN 35 (H) 10/28/2020 0912   BUN 36 (A) 08/30/2017 0000   CREATININE 1.73 (H) 10/28/2020 0912   CREATININE 1.78 (H) 06/19/2020 0842   CALCIUM 9.2 10/28/2020 0912   PROT 6.8 10/28/2020 0912   ALBUMIN 3.3 (L) 10/28/2020 0912   AST 26 10/28/2020 0912   AST 24 06/19/2020 0842   ALT 29 10/28/2020 0912   ALT 30 06/19/2020 0842   ALKPHOS 111 10/28/2020 0912   BILITOT 0.4 10/28/2020 0912   BILITOT 0.5 06/19/2020 0842   GFRNONAA 31 (L) 10/28/2020 0912   GFRNONAA 28 (L) 06/19/2020 0842   GFRAA 34 (L) 06/26/2020 1535   GFRAA 33 (L) 06/19/2020 0842    No results found for: TOTALPROTELP, ALBUMINELP, A1GS, A2GS, BETS, BETA2SER, GAMS, MSPIKE, SPEI  Lab Results  Component Value Date   WBC 7.6 11/18/2020   NEUTROABS 6.1 11/18/2020   HGB 12.3 11/18/2020   HCT 35.5 (L) 11/18/2020   MCV 99.7 11/18/2020   PLT 107 (L) 11/18/2020    No results found for: LABCA2  No components found for: SXOUJH313  No results  for input(s): INR in the last 168 hours.  No results found for: LABCA2  No results found for: ILA817  No results found for: BNW586  No results found for: FIY429  No results found for: CA2729  No components found for: HGQUANT  No results found for: CEA1 / No results found for: CEA1   No results found for: AFPTUMOR  No results found for: CHROMOGRNA  No results found for: KPAFRELGTCHN, LAMBDASER, KAPLAMBRATIO (kappa/lambda light chains)  No results found for: HGBA, HGBA2QUANT, HGBFQUANT, HGBSQUAN (Hemoglobinopathy evaluation)   No results found for: LDH  No results found for: IRON, TIBC, IRONPCTSAT (Iron and TIBC)  No results found for: FERRITIN  Urinalysis    Component Value Date/Time   COLORURINE YELLOW 08/06/2020 1414   APPEARANCEUR HAZY (A) 08/06/2020 1414   LABSPEC 1.009 08/06/2020 1414   PHURINE 6.0 08/06/2020 1414   GLUCOSEU NEGATIVE 08/06/2020 1414   HGBUR SMALL (A) 08/06/2020 1414   BILIRUBINUR NEGATIVE 08/06/2020 1414   BILIRUBINUR negative 06/27/2018 0934   KETONESUR NEGATIVE 08/06/2020 1414   PROTEINUR NEGATIVE 08/06/2020 1414   UROBILINOGEN negative (A) 06/27/2018 0934   UROBILINOGEN 1.0 04/29/2013 1455   NITRITE NEGATIVE 08/06/2020 1414   LEUKOCYTESUR LARGE (A) 08/06/2020 1414    STUDIES: No results found.   ELIGIBLE FOR AVAILABLE RESEARCH PROTOCOL: AET  ASSESSMENT: 70 y.o. [place] woman status post right breast upper outer quadrant biopsy 03/26/2020, for a clinical T1c N0, stage IA invasive lobular carcinoma, E-cadherin negative, grade 2, estrogen and progesterone receptor positive, with an MIB-1 of 5% and no HER-2 amplification  (a) unable to obtain breast MRI due to renal concern with contrast status post nephrectomy  (1) status post right lumpectomy and sentinel lymph node sampling 04/17/2020 for a pT2 pN0, stage IB invasive lobular carcinoma, grade 2, with negative margins  (a) a single right axillary lymph node was  removed.  (2) Oncotype score of 29 predicts a risk of recurrence outside the breast within the next 9 years of 18% with antiestrogens only, and also a greater than 15% benefit from chemotherapy.  (3) chemotherapy  consisting of cyclophosphamide, methotrexate and fluorouracil given every 21 days x 8, started 06/19/2020, completed 11/18/2020 (a) Udenyca added a day 3 beginning with cycle 2 secondary to treatment delays due to neutropenia  (4) adjuvant radiation to follow  (5) anastrozole started 04/03/2020 with anticipation of surgical delays  PLAN: Sarae completes her chemotherapy today.  She has done generally quite well.  I expect within 3 to 4 months her hair will start coming in fairly strongly.  She does have some mouth sores occasionally with the chemo.  She did not want Valtrex today because of kidney concerns she said.  Her diastolic is a bit on the low side.  This has been variable in the past.  The only blood pressure medicine she is on is metoprolol and I would not want to change that.  I think basically she just needs to drink more fluids.  We discussed what she can do to remind herself to have at least a quart of fluid by the beginning of the afternoon and at least a half a quart and the remaining of the day.  She is already scheduled to see radiation mid March.  Likely radiation will last well into April.  I am going to see her again in May and at that point we will initiate long-term follow-up  She knows to call for any other issue that may develop before the next visit  Total encounter time 25 minutes.Sarajane Jews C. Jacie Tristan, MD 11/18/2020 10:06 AM Medical Oncology and Hematology [facility] [place], [place] [address] Tel. [phone]    Fax. [phone]   This document serves as a record of services personally performed by Lurline Del, MD. It was created on his behalf by Wilburn Mylar, a trained medical scribe. The creation of this  record is based on the scribe's personal observations and the provider's statements to them.   I, Lurline Del MD, have reviewed the above documentation for accuracy and completeness, and I agree with the above.   *Total Encounter Time as defined by the Centers for Medicare and Medicaid Services includes, in addition to the face-to-face time of a patient visit (documented in the note above) non-face-to-face time: obtaining and reviewing outside history, ordering and reviewing medications, tests or procedures, care coordination (communications with other health care professionals or caregivers) and documentation in the medical record.

## 2020-11-18 ENCOUNTER — Inpatient Hospital Stay: Payer: Medicare Other

## 2020-11-18 ENCOUNTER — Inpatient Hospital Stay (HOSPITAL_BASED_OUTPATIENT_CLINIC_OR_DEPARTMENT_OTHER): Payer: Medicare Other | Admitting: Oncology

## 2020-11-18 ENCOUNTER — Other Ambulatory Visit: Payer: Self-pay

## 2020-11-18 ENCOUNTER — Encounter: Payer: Self-pay | Admitting: *Deleted

## 2020-11-18 VITALS — BP 138/50 | HR 69 | Temp 97.9°F | Resp 18 | Ht 68.0 in | Wt 224.5 lb

## 2020-11-18 DIAGNOSIS — C652 Malignant neoplasm of left renal pelvis: Secondary | ICD-10-CM | POA: Diagnosis not present

## 2020-11-18 DIAGNOSIS — Z170 Estrogen receptor positive status [ER+]: Secondary | ICD-10-CM | POA: Diagnosis not present

## 2020-11-18 DIAGNOSIS — N184 Chronic kidney disease, stage 4 (severe): Secondary | ICD-10-CM

## 2020-11-18 DIAGNOSIS — C50411 Malignant neoplasm of upper-outer quadrant of right female breast: Secondary | ICD-10-CM

## 2020-11-18 DIAGNOSIS — E118 Type 2 diabetes mellitus with unspecified complications: Secondary | ICD-10-CM

## 2020-11-18 LAB — CBC WITH DIFFERENTIAL/PLATELET
Abs Immature Granulocytes: 0.05 10*3/uL (ref 0.00–0.07)
Basophils Absolute: 0.1 10*3/uL (ref 0.0–0.1)
Basophils Relative: 1 %
Eosinophils Absolute: 0.1 10*3/uL (ref 0.0–0.5)
Eosinophils Relative: 1 %
HCT: 35.5 % — ABNORMAL LOW (ref 36.0–46.0)
Hemoglobin: 12.3 g/dL (ref 12.0–15.0)
Immature Granulocytes: 1 %
Lymphocytes Relative: 9 %
Lymphs Abs: 0.7 10*3/uL (ref 0.7–4.0)
MCH: 34.6 pg — ABNORMAL HIGH (ref 26.0–34.0)
MCHC: 34.6 g/dL (ref 30.0–36.0)
MCV: 99.7 fL (ref 80.0–100.0)
Monocytes Absolute: 0.5 10*3/uL (ref 0.1–1.0)
Monocytes Relative: 7 %
Neutro Abs: 6.1 10*3/uL (ref 1.7–7.7)
Neutrophils Relative %: 81 %
Platelets: 107 10*3/uL — ABNORMAL LOW (ref 150–400)
RBC: 3.56 MIL/uL — ABNORMAL LOW (ref 3.87–5.11)
RDW: 14.2 % (ref 11.5–15.5)
WBC: 7.6 10*3/uL (ref 4.0–10.5)
nRBC: 0 % (ref 0.0–0.2)

## 2020-11-18 LAB — COMPREHENSIVE METABOLIC PANEL
ALT: 25 U/L (ref 0–44)
AST: 26 U/L (ref 15–41)
Albumin: 3.3 g/dL — ABNORMAL LOW (ref 3.5–5.0)
Alkaline Phosphatase: 105 U/L (ref 38–126)
Anion gap: 12 (ref 5–15)
BUN: 30 mg/dL — ABNORMAL HIGH (ref 8–23)
CO2: 22 mmol/L (ref 22–32)
Calcium: 9 mg/dL (ref 8.9–10.3)
Chloride: 105 mmol/L (ref 98–111)
Creatinine, Ser: 1.9 mg/dL — ABNORMAL HIGH (ref 0.44–1.00)
GFR, Estimated: 28 mL/min — ABNORMAL LOW (ref >60)
Glucose, Bld: 227 mg/dL — ABNORMAL HIGH (ref 70–99)
Potassium: 4.7 mmol/L (ref 3.5–5.1)
Sodium: 139 mmol/L (ref 135–145)
Total Bilirubin: 0.4 mg/dL (ref 0.3–1.2)
Total Protein: 6.6 g/dL (ref 6.5–8.1)

## 2020-11-18 MED ORDER — SODIUM CHLORIDE 0.9 % IV SOLN
150.0000 mg | Freq: Once | INTRAVENOUS | Status: AC
  Administered 2020-11-18: 150 mg via INTRAVENOUS
  Filled 2020-11-18: qty 150

## 2020-11-18 MED ORDER — PALONOSETRON HCL INJECTION 0.25 MG/5ML
0.2500 mg | Freq: Once | INTRAVENOUS | Status: AC
  Administered 2020-11-18: 0.25 mg via INTRAVENOUS

## 2020-11-18 MED ORDER — FLUOROURACIL CHEMO INJECTION 2.5 GM/50ML
600.0000 mg/m2 | Freq: Once | INTRAVENOUS | Status: AC
  Administered 2020-11-18: 1350 mg via INTRAVENOUS
  Filled 2020-11-18: qty 27

## 2020-11-18 MED ORDER — METHOTREXATE SODIUM (PF) CHEMO INJECTION 250 MG/10ML
30.0000 mg/m2 | Freq: Once | INTRAMUSCULAR | Status: AC
Start: 2020-11-18 — End: 2020-11-18
  Administered 2020-11-18: 67 mg via INTRAVENOUS
  Filled 2020-11-18: qty 2.68

## 2020-11-18 MED ORDER — SODIUM CHLORIDE 0.9 % IV SOLN
500.0000 mg/m2 | Freq: Once | INTRAVENOUS | Status: AC
  Administered 2020-11-18: 1120 mg via INTRAVENOUS
  Filled 2020-11-18: qty 56

## 2020-11-18 MED ORDER — PROCHLORPERAZINE EDISYLATE 10 MG/2ML IJ SOLN
INTRAMUSCULAR | Status: AC
  Filled 2020-11-18: qty 2

## 2020-11-18 MED ORDER — PROCHLORPERAZINE EDISYLATE 10 MG/2ML IJ SOLN
10.0000 mg | Freq: Once | INTRAMUSCULAR | Status: AC
  Administered 2020-11-18: 10 mg via INTRAVENOUS

## 2020-11-18 MED ORDER — HEPARIN SOD (PORK) LOCK FLUSH 100 UNIT/ML IV SOLN
500.0000 [IU] | Freq: Once | INTRAVENOUS | Status: AC | PRN
  Administered 2020-11-18: 500 [IU]
  Filled 2020-11-18: qty 5

## 2020-11-18 MED ORDER — PALONOSETRON HCL INJECTION 0.25 MG/5ML
INTRAVENOUS | Status: AC
  Filled 2020-11-18: qty 5

## 2020-11-18 MED ORDER — SODIUM CHLORIDE 0.9% FLUSH
10.0000 mL | INTRAVENOUS | Status: DC | PRN
  Administered 2020-11-18: 10 mL
  Filled 2020-11-18: qty 10

## 2020-11-18 MED ORDER — SODIUM CHLORIDE 0.9 % IV SOLN
INTRAVENOUS | Status: DC
  Filled 2020-11-18: qty 250

## 2020-11-18 NOTE — Progress Notes (Signed)
Per Dr. Jana Hakim pt ok to treat with Cr of 1.9

## 2020-11-18 NOTE — Progress Notes (Signed)
Per Dr. Jana Hakim pt ok to treat with low BP. Pt to get 500 cc NS/ orders placed.

## 2020-11-18 NOTE — Patient Instructions (Signed)
[facility] Discharge Instructions for Patients Receiving Chemotherapy  Today you received the following chemotherapy agents: Cytoxan/Methotrexate/5FU.  To help prevent nausea and vomiting after your treatment, we encourage you to take your nausea medication as directed.   If you develop nausea and vomiting that is not controlled by your nausea medication, call the clinic.   BELOW ARE SYMPTOMS THAT SHOULD BE REPORTED IMMEDIATELY:  *FEVER GREATER THAN 100.5 F  *CHILLS WITH OR WITHOUT FEVER  NAUSEA AND VOMITING THAT IS NOT CONTROLLED WITH YOUR NAUSEA MEDICATION  *UNUSUAL SHORTNESS OF BREATH  *UNUSUAL BRUISING OR BLEEDING  TENDERNESS IN MOUTH AND THROAT WITH OR WITHOUT PRESENCE OF ULCERS  *URINARY PROBLEMS  *BOWEL PROBLEMS  UNUSUAL RASH Items with * indicate a potential emergency and should be followed up as soon as possible.  Feel free to call the clinic should you have any questions or concerns. The clinic phone number is [phone].  Please show the [facility] at check-in to the Emergency Department and triage nurse.

## 2020-11-18 NOTE — Progress Notes (Signed)
Per Dr. Magrinat, ok to treat with elevated creatinine.  

## 2020-12-02 ENCOUNTER — Other Ambulatory Visit: Payer: Self-pay

## 2020-12-02 ENCOUNTER — Encounter: Payer: Self-pay | Admitting: Family Medicine

## 2020-12-02 ENCOUNTER — Ambulatory Visit (INDEPENDENT_AMBULATORY_CARE_PROVIDER_SITE_OTHER): Payer: Medicare Other | Admitting: Family Medicine

## 2020-12-02 VITALS — BP 140/60 | HR 75 | Temp 97.8°F | Ht 68.0 in | Wt 225.6 lb

## 2020-12-02 DIAGNOSIS — R300 Dysuria: Secondary | ICD-10-CM | POA: Diagnosis not present

## 2020-12-02 DIAGNOSIS — R3911 Hesitancy of micturition: Secondary | ICD-10-CM | POA: Diagnosis not present

## 2020-12-02 LAB — POCT URINALYSIS DIP (MANUAL ENTRY)
Bilirubin, UA: NEGATIVE
Glucose, UA: 500 mg/dL — AB
Ketones, POC UA: NEGATIVE mg/dL
Nitrite, UA: NEGATIVE
Protein Ur, POC: NEGATIVE mg/dL
Spec Grav, UA: 1.02 (ref 1.010–1.025)
Urobilinogen, UA: 0.2 E.U./dL
pH, UA: 6 (ref 5.0–8.0)

## 2020-12-02 MED ORDER — CIPROFLOXACIN HCL 250 MG PO TABS: 250.0000 mg | ORAL_TABLET | Freq: Two times a day (BID) | ORAL | 0 refills | Status: DC

## 2020-12-02 NOTE — Progress Notes (Addendum)
[place] at [facility] [address] [place], [place] [address] [phone]  Date:  12/02/2020   Name:  Tasha Tucker   DOB:  1950-10-23   MRN:  [id number]  PCP:  Darreld Mclean, MD    Chief Complaint: burning while urination and Dysuria (Unable to finish urinating. Started over the weekend. )   History of Present Illness:  Tasha Tucker is a 70 y.o. very pleasant female patient who presents with the following:  Pt here today with concern of UTI Last seen by myself a month or so ago She was diagnosed with breast cancer last year, oncologist is Dr. Jana Hakim She recently completed her chemotherapy and will be starting radiation a few weeks  Also with history of diabetes, hyperlipidemia, stage IV chronic kidney disease, renal and bladder cancer, cerebral aneurysm status post repair, hypertension She had a left nephrectomy in 2018, brain aneurysm clipping in 1990 Nephrologist-[place] Urologist is Dr. Tresa Moore  She has noted some burning with urination for 2 to 3 days, a feeling like she is not emptying her bladder completely She is not having frequently more than normal No hematuria No fever or chills No belly pain   Lab Results  Component Value Date   HGBA1C 7.8 (H) 06/17/2020    Patient Active Problem List   Diagnosis Date Noted  . Port-A-Cath in place 07/11/2020  . Malignant neoplasm of upper-outer quadrant of right breast in female, estrogen receptor positive ([place]) 03/28/2020  . Controlled type 2 diabetes mellitus with complication, with long-term current use of insulin ([place]) 03/19/2018  . Hyperlipidemia 01/12/2018  . Cerebral aneurysm 12/29/2017  . Secondary hypertension, unspecified 12/29/2017  . CKD (chronic kidney disease) stage 4, GFR 15-29 ml/min (HCC) 12/29/2017  . Cancer of renal pelvis, left ([place]) 10/12/2016  . Renal mass 10/09/2016    Past Medical History:  Diagnosis Date  . Bladder cancer ([place])     [place]  . Breast cancer ([place])   . DJD (degenerative joint disease)    NECK, NEEDS PILLOW WHEN LYING FLAT IS POSSIBLE  . History of cerebral aneurysm repair    1996-- S/P CLAPPING RIGHT CAROTID --- NO DEFICITS  . 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 Hyperlipidemia   . Hypertension   . Liver hemangioma   . OA (osteoarthritis)   . PAC (premature atrial contraction)   . PONV (postoperative nausea and vomiting)   . Right trigger finger   . Type 2 diabetes mellitus ([place])   . Wears glasses     Past Surgical History:  Procedure Laterality Date  . ABDOMINAL HYSTERECTOMY  1990   PARTIAL  . BREAST LUMPECTOMY WITH RADIOACTIVE SEED AND SENTINEL LYMPH NODE BIOPSY Right 04/17/2020   Procedure: BREAST LUMPECTOMY X 2  WITH RADIOACTIVE SEED AND SENTINEL LYMPH NODE BIOPSY;  Surgeon: Coralie Keens, MD;  Location: [place];  Service: General;  Laterality: Right;  . CATARACT EXTRACTION W/ INTRAOCULAR LENS  IMPLANT, BILATERAL  2016; 2017  . CEREBRAL ANEURYSM REPAIR  1996   clipped-no deficits RIGHT CAROTID  . CHOLECYSTECTOMY  2010  . CYSTOSCOPY W/ RETROGRADES Right 06/09/2017   Procedure: CYSTOSCOPY WITH RETROGRADE PYELOGRAM;  Surgeon: Alexis Frock, MD;  [facility];  Service: Urology;  Laterality: Right;  . CYSTOSCOPY W/ RETROGRADES Right 07/21/2017   Procedure: CYSTOSCOPY WITH RETROGRADE PYELOGRAM;  Surgeon:  Alexis Frock, MD;  Location: [facility];  Service: Urology;  Laterality: Right;  . CYSTOSCOPY WITH RETROGRADE PYELOGRAM, URETEROSCOPY AND STENT PLACEMENT Left 08/26/2016   Procedure: CYSTOSCOPY WITH BILATERAL RETROGRADE PYELOGRAM,LEFT URETEROSCOPY WITH BIOPSY AND LEFT STENT PLACEMENT;  Surgeon: Alexis Frock, MD;  Location: [facility];  Service: Urology;  Laterality: Left;  . PORTACATH PLACEMENT Left 06/18/2020   Procedure:  INSERTION PORT-A-CATH WITH ULTRASOUND GUIDANCE;  Surgeon: Coralie Keens, MD;  Location: [place];  Service: General;  Laterality: Left;  . ROBOT ASSITED LAPAROSCOPIC NEPHROURETERECTOMY Left 10/09/2016   Procedure: XI ROBOT ASSITED LAPAROSCOPIC NEPHROURETERECTOMY;  Surgeon: Alexis Frock, MD;  Location: [facility];  Service: Urology;  Laterality: Left;  . SURGERY ON FOOT FOR STAPH INFECTION  1989  . TRANSURETHRAL RESECTION OF BLADDER TUMOR N/A 07/21/2017   Procedure: TRANSURETHRAL RESECTION OF BLADDER TUMOR (TURBT);  Surgeon: Alexis Frock, MD;  Location: [facility];  Service: Urology;  Laterality: N/A;  . TRANSURETHRAL RESECTION OF BLADDER TUMOR WITH MITOMYCIN-C N/A 06/09/2017   Procedure: TRANSURETHRAL RESECTION OF BLADDER TUMOR WITH MITOMYCIN-C;  Surgeon: Alexis Frock, MD;  Location: [facility];  Service: Urology;  Laterality: N/A;  . TUBAL LIGATION  1980    Social History   Tobacco Use  . Smoking status: Never Smoker  . Smokeless tobacco: Never Used  Vaping Use  . Vaping Use: Never used  Substance Use Topics  . Alcohol use: Not Currently    Comment: , STOPPED  . Drug use: No    Family History  Problem Relation Age of Onset  . Cancer Mother   . Hyperlipidemia Father   . Heart attack Father   . Diabetes Brother   . Breast cancer Neg Hx     Allergies  Allergen Reactions  . Other Other (See Comments)  . Adhesive [Tape] Rash    PAPER TAPE ONLY  . Chlorhexidine Gluconate Itching and Rash    Topical antiseptic (CHG WIPES)   . Dilantin [Phenytoin] Rash  . Penicillins Rash    Has patient had a PCN reaction causing immediate rash, facial/tongue/throat swelling, SOB or lightheadedness with hypotension:unsure Has patient had a PCN reaction causing severe rash involving mucus membranes or skin necrosis:unsure Has patient had a PCN reaction that required hospitalization:No Has patient had a PCN reaction occurring within the last 10 years:No If all  of the above answers are "NO", then may proceed with Cephalosporin use.   . Sulfa Antibiotics Rash    Medication list has been reviewed and updated.  Current Outpatient Medications on File Prior to Visit  Medication Sig Dispense Refill  . acetaminophen (TYLENOL) 500 MG tablet Take 500 mg by mouth at bedtime.     Marland Kitchen anastrozole (ARIMIDEX) 1 MG tablet Take 1 tablet (1 mg total) by mouth daily. 90 tablet 4  . BD PEN NEEDLE NANO U/F 32G X 4 MM MISC USE FOR INSULIN ADMINISTRATION DAILY 100 each 3  . carvedilol (COREG) 6.25 MG tablet Take 1 tablet (6.25 mg total) by mouth 2 (two) times daily. 180 tablet 3  . cetirizine (ZYRTEC) 10 MG tablet Take 10 mg by mouth every morning.    Marland Kitchen HUMALOG KWIKPEN 100 UNIT/ML KwikPen Inject 5-6 Units into the skin 3 (three) times daily.     . insulin glargine (LANTUS SOLOSTAR) 100 UNIT/ML Solostar Pen Inject 18 Units into the skin daily.    Marland Kitchen lidocaine-prilocaine (EMLA) cream Apply to affected area once 30 g 3  . ONETOUCH DELICA LANCETS 20N MISC  Use lancet to stick finger and check blood sugar two times daily. 100 each 6  . ONETOUCH VERIO test strip USE AS DIRECTED. 300 each 2  . pravastatin (PRAVACHOL) 40 MG tablet TAKE 1 TABLET BY MOUTH EVERY DAY 90 tablet 3  . prochlorperazine (COMPAZINE) 10 MG tablet Take 1 tablet (10 mg total) by mouth every 6 (six) hours as needed (Nausea or vomiting). (Patient not taking: Reported on 12/02/2020) 30 tablet 1   No current facility-administered medications on file prior to visit.    Review of Systems:  As per HPI- otherwise negative.   Physical Examination: Vitals:   12/02/20 1408  BP: 140/60  Pulse: 75  Temp: 97.8 F (36.6 C)  SpO2: 99%   Vitals:   12/02/20 1408  Weight: 225 lb 9.6 oz (102.3 kg)  Height: 5\' 8"  (1.727 m)   Body mass index is 34.3 kg/m. Ideal Body Weight: Weight in (lb) to have BMI = 25: 164.1  GEN: no acute distress.  Obese, looks well  HEENT: Atraumatic, Normocephalic.  Ears and Nose: No  external deformity. CV: RRR, No M/G/R. No JVD. No thrill. No extra heart sounds. PULM: CTA B, no wheezes, crackles, rhonchi. No retractions. No resp. distress. No accessory muscle use. ABD: S, NT, ND, +BS. No rebound. No HSM.  Benign belly, no CVA tenderness EXTR: No c/c/e PSYCH: Normally interactive. Conversant.   Creat clearance is 43-okay for Cipro 250 twice daily Results for orders placed or performed in visit on 12/02/20  POCT urinalysis dipstick  Result Value Ref Range   Color, UA yellow yellow   Clarity, UA cloudy (A) clear   Glucose, UA =500 (A) negative mg/dL   Bilirubin, UA negative negative   Ketones, POC UA negative negative mg/dL   Spec Grav, UA 1.020 1.010 - 1.025   Blood, UA trace-intact (A) negative   pH, UA 6.0 5.0 - 8.0   Protein Ur, POC negative negative mg/dL   Urobilinogen, UA 0.2 0.2 or 1.0 E.U./dL   Nitrite, UA Negative Negative   Leukocytes, UA Trace (A) Negative    Assessment and Plan: Dysuria - Plan: POCT urinalysis dipstick, Urine Culture, Urine Microscopic Only, ciprofloxacin (CIPRO) 250 MG tablet  Urinary hesitancy - Plan: POCT urinalysis dipstick, Urine Culture  Patient today with concern of possible UTI.  Her urinalysis does suggest possible infection.  Will send for micro to quantify trace hematuria, culture Prescription for Cipro due to patient allergies and low renal function- cannot use Macrobid Noted glucose in her urine, offered to perform an A1c/ otherwise follow-up on blood sugar today.  She declines for now, she is seeing her endocrinologist in the next couple of months.  I did encourage her to monitor her blood sugars at home and seek care if they are often higher than 200  Signed Lamar Blinks, MD  Received urine culture 3/9, message to patient  Results for orders placed or performed in visit on 12/02/20  Urine Culture   Specimen: Urine  Result Value Ref Range   MICRO NUMBER: [id number]    SPECIMEN QUALITY: Adequate    Sample  Source NOT GIVEN    STATUS: FINAL    ISOLATE 1: Escherichia coli (A)       Susceptibility   Escherichia coli - URINE CULTURE, REFLEX    AMOX/CLAVULANIC <=2 Sensitive     AMPICILLIN <=2 Sensitive     AMPICILLIN/SULBACTAM <=2 Sensitive     CEFAZOLIN* <=4 Not Reportable      * For infections other  than uncomplicated UTIcaused by E. coli, K. pneumoniae or P. mirabilis:Cefazolin is resistant if MIC > or = 8 mcg/mL.(Distinguishing susceptible versus intermediatefor isolates with MIC < or = 4 mcg/mL requiresadditional testing.)For uncomplicated UTI caused by E. coli,K. pneumoniae or P. mirabilis: Cefazolin issusceptible if MIC <32 mcg/mL and predictssusceptible to the oral agents cefaclor, cefdinir,cefpodoxime, cefprozil, cefuroxime, cephalexinand loracarbef.    CEFEPIME <=1 Sensitive     CEFTRIAXONE <=1 Sensitive     CIPROFLOXACIN <=0.25 Sensitive     LEVOFLOXACIN <=0.12 Sensitive     ERTAPENEM <=0.5 Sensitive     GENTAMICIN <=1 Sensitive     IMIPENEM <=0.25 Sensitive     NITROFURANTOIN 32 Sensitive     PIP/TAZO <=4 Sensitive     TOBRAMYCIN <=1 Sensitive     TRIMETH/SULFA* <=20 Sensitive      * For infections other than uncomplicated UTIcaused by E. coli, K. pneumoniae or P. mirabilis:Cefazolin is resistant if MIC > or = 8 mcg/mL.(Distinguishing susceptible versus intermediatefor isolates with MIC < or = 4 mcg/mL requiresadditional testing.)For uncomplicated UTI caused by E. coli,K. pneumoniae or P. mirabilis: Cefazolin issusceptible if MIC <32 mcg/mL and predictssusceptible to the oral agents cefaclor, cefdinir,cefpodoxime, cefprozil, cefuroxime, cephalexinand loracarbef.Legend:S = Susceptible  I = IntermediateR = Resistant  NS = Not susceptible* = Not tested  NR = Not reported**NN = See antimicrobic comments  Urine Microscopic Only  Result Value Ref Range   WBC, UA 11-20/hpf (A) 0-2/hpf   RBC / HPF 0-2/hpf 0-2/hpf   Squamous Epithelial / LPF Rare(0-4/hpf) Rare(0-4/hpf)   Bacteria, UA  Many(>50/hpf) (A) None  POCT urinalysis dipstick  Result Value Ref Range   Color, UA yellow yellow   Clarity, UA cloudy (A) clear   Glucose, UA =500 (A) negative mg/dL   Bilirubin, UA negative negative   Ketones, POC UA negative negative mg/dL   Spec Grav, UA 1.020 1.010 - 1.025   Blood, UA trace-intact (A) negative   pH, UA 6.0 5.0 - 8.0   Protein Ur, POC negative negative mg/dL   Urobilinogen, UA 0.2 0.2 or 1.0 E.U./dL   Nitrite, UA Negative Negative   Leukocytes, UA Trace (A) Negative

## 2020-12-02 NOTE — Patient Instructions (Signed)
Good to see you today- we will treat you with cipro for 5 days for UTI I will be in touch with your urine culture Please keep an eye on your blood sugars and let me or your endocrinologist know if you are often running higher than 200

## 2020-12-03 LAB — URINALYSIS, MICROSCOPIC ONLY

## 2020-12-04 ENCOUNTER — Ambulatory Visit
Admission: RE | Admit: 2020-12-04 | Discharge: 2020-12-04 | Disposition: A | Discharge status: Home / Self Care | Payer: Medicare Other | Source: Ambulatory Visit | Attending: Radiation Oncology | Admitting: Radiation Oncology

## 2020-12-04 ENCOUNTER — Encounter: Payer: Self-pay | Admitting: Radiation Oncology

## 2020-12-04 ENCOUNTER — Encounter: Payer: Self-pay | Admitting: Family Medicine

## 2020-12-04 ENCOUNTER — Other Ambulatory Visit: Payer: Self-pay

## 2020-12-04 VITALS — BP 121/63 | HR 69 | Temp 96.8°F | Resp 32 | Ht 68.0 in | Wt 223.4 lb

## 2020-12-04 DIAGNOSIS — Z794 Long term (current) use of insulin: Secondary | ICD-10-CM | POA: Insufficient documentation

## 2020-12-04 DIAGNOSIS — Z170 Estrogen receptor positive status [ER+]: Secondary | ICD-10-CM | POA: Diagnosis not present

## 2020-12-04 DIAGNOSIS — E119 Type 2 diabetes mellitus without complications: Secondary | ICD-10-CM | POA: Insufficient documentation

## 2020-12-04 DIAGNOSIS — C50411 Malignant neoplasm of upper-outer quadrant of right female breast: Secondary | ICD-10-CM | POA: Diagnosis not present

## 2020-12-04 DIAGNOSIS — I10 Essential (primary) hypertension: Secondary | ICD-10-CM | POA: Insufficient documentation

## 2020-12-04 DIAGNOSIS — Z510 Encounter for antineoplastic radiation therapy: Secondary | ICD-10-CM | POA: Insufficient documentation

## 2020-12-04 DIAGNOSIS — E785 Hyperlipidemia, unspecified: Secondary | ICD-10-CM | POA: Insufficient documentation

## 2020-12-04 DIAGNOSIS — Z8551 Personal history of malignant neoplasm of bladder: Secondary | ICD-10-CM | POA: Insufficient documentation

## 2020-12-04 DIAGNOSIS — Z803 Family history of malignant neoplasm of breast: Secondary | ICD-10-CM | POA: Insufficient documentation

## 2020-12-04 DIAGNOSIS — R064 Hyperventilation: Secondary | ICD-10-CM | POA: Diagnosis not present

## 2020-12-04 DIAGNOSIS — M1990 Unspecified osteoarthritis, unspecified site: Secondary | ICD-10-CM | POA: Insufficient documentation

## 2020-12-04 DIAGNOSIS — Z79899 Other long term (current) drug therapy: Secondary | ICD-10-CM | POA: Diagnosis not present

## 2020-12-04 DIAGNOSIS — Z809 Family history of malignant neoplasm, unspecified: Secondary | ICD-10-CM | POA: Diagnosis not present

## 2020-12-04 LAB — URINE CULTURE
MICRO NUMBER:: 11615470
SPECIMEN QUALITY:: ADEQUATE

## 2020-12-04 NOTE — Progress Notes (Signed)
Radiation Oncology         [phone] ________________________________  Name: Tasha Tucker        MRN: [id number]  Date of Service: 12/04/2020 DOB: [birth year]  FO:YDXAJOI, Gay Filler, MD  Magrinat, Virgie Dad, MD     REFERRING PHYSICIAN: Magrinat, Virgie Dad, MD   DIAGNOSIS: The encounter diagnosis was Malignant neoplasm of upper-outer quadrant of right breast in female, estrogen receptor positive ([place]).   HISTORY OF PRESENT ILLNESS: Tasha Tucker is a 70 y.o. female seen at the request of Dr. Jana Hakim for a diagnosis of right breast cancer. The patient was found in June 2021 to have an abnormality in the right breast on screening mammogram. Additional work up showed a 1.9 cm mass/distortion in the 9:00 position and no adenopathy. A biopsy at that time showed a grade 2 invasive lobular carcinoma that was Er/PR positive, HER2 negative with a Ki 67 of 5%. She underwent lumpectomy with sentinel node on 04/17/20. Final pathology revealed a grade 2 invasive lobular carcinoma that measured 4.3 cm. A single node was negative. She did have an oncotype dx score of 29 so she completed adjuvant chemotherapy under the care of Dr. Jana Hakim. Her last dose of chemo was on 11/18/20. She's seen today to discuss adjuvant radiotherapy.    PREVIOUS RADIATION THERAPY: No   PAST MEDICAL HISTORY:  Past Medical History:  Diagnosis Date  . Bladder cancer ([place])    [place]  . Breast cancer ([place])   . DJD (degenerative joint disease)    NECK, NEEDS PILLOW WHEN LYING FLAT IS POSSIBLE  . History of cerebral aneurysm repair    1996-- S/P CLAPPING RIGHT CAROTID --- NO DEFICITS  . 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 Hyperlipidemia   . Hypertension   . Liver hemangioma   . OA (osteoarthritis)   . PAC (premature atrial contraction)   . PONV (postoperative nausea and vomiting)   . Right trigger finger   .  Type 2 diabetes mellitus ([place])   . Wears glasses        PAST SURGICAL HISTORY: Past Surgical History:  Procedure Laterality Date  . ABDOMINAL HYSTERECTOMY  1990   PARTIAL  . BREAST LUMPECTOMY WITH RADIOACTIVE SEED AND SENTINEL LYMPH NODE BIOPSY Right 04/17/2020   Procedure: BREAST LUMPECTOMY X 2  WITH RADIOACTIVE SEED AND SENTINEL LYMPH NODE BIOPSY;  Surgeon: Coralie Keens, MD;  Location: [place];  Service: General;  Laterality: Right;  . CATARACT EXTRACTION W/ INTRAOCULAR LENS  IMPLANT, BILATERAL  2016; 2017  . CEREBRAL ANEURYSM REPAIR  1996   clipped-no deficits RIGHT CAROTID  . CHOLECYSTECTOMY  2010  . CYSTOSCOPY W/ RETROGRADES Right 06/09/2017   Procedure: CYSTOSCOPY WITH RETROGRADE PYELOGRAM;  Surgeon: Alexis Frock, MD;  Location: [facility];  Service: Urology;  Laterality: Right;  . CYSTOSCOPY W/ RETROGRADES Right 07/21/2017   Procedure: CYSTOSCOPY WITH RETROGRADE PYELOGRAM;  Surgeon: Alexis Frock, MD;  Location: [facility];  Service: Urology;  Laterality: Right;  . CYSTOSCOPY WITH RETROGRADE PYELOGRAM, URETEROSCOPY AND STENT PLACEMENT Left 08/26/2016   Procedure: CYSTOSCOPY WITH BILATERAL RETROGRADE PYELOGRAM,LEFT URETEROSCOPY WITH BIOPSY AND LEFT STENT PLACEMENT;  Surgeon: Alexis Frock, MD;  Location: [facility];  Service: Urology;  Laterality: Left;  . PORTACATH PLACEMENT Left 06/18/2020   Procedure: INSERTION PORT-A-CATH WITH ULTRASOUND GUIDANCE;  Surgeon: Coralie Keens, MD;  Location: [place];  Service:  General;  Laterality: Left;  . ROBOT ASSITED LAPAROSCOPIC NEPHROURETERECTOMY Left 10/09/2016   Procedure: XI ROBOT ASSITED LAPAROSCOPIC NEPHROURETERECTOMY;  Surgeon: Alexis Frock, MD;  Location[facility];  Service: Urology;  Laterality: Left;  . SURGERY ON FOOT FOR STAPH INFECTION  1989  . TRANSURETHRAL RESECTION OF BLADDER TUMOR N/A 07/21/2017   Procedure: TRANSURETHRAL RESECTION OF BLADDER TUMOR (TURBT);  Surgeon: Alexis Frock, MD;  Location: [facility];  Service: Urology;  Laterality: N/A;  . TRANSURETHRAL RESECTION OF BLADDER TUMOR WITH MITOMYCIN-C N/A 06/09/2017   Procedure: TRANSURETHRAL RESECTION OF BLADDER TUMOR WITH MITOMYCIN-C;  Surgeon: Alexis Frock, MD;  Location: [facility];  Service: Urology;  Laterality: N/A;  . TUBAL LIGATION  1980     FAMILY HISTORY:  Family History  Problem Relation Age of Onset  . Cancer Mother   . Hyperlipidemia Father   . Heart attack Father   . Diabetes Brother   . Breast cancer Neg Hx      SOCIAL HISTORY:  reports that she has never smoked. She has never used smokeless tobacco. She reports previous alcohol use. She reports that she does not use drugs.   ALLERGIES: Other, Adhesive [tape], Chlorhexidine gluconate, Dilantin [phenytoin], Penicillins, and Sulfa antibiotics   MEDICATIONS:  Current Outpatient Medications  Medication Sig Dispense Refill  . acetaminophen (TYLENOL) 500 MG tablet Take 500 mg by mouth at bedtime.     Marland Kitchen anastrozole (ARIMIDEX) 1 MG tablet Take 1 tablet (1 mg total) by mouth daily. 90 tablet 4  . BD PEN NEEDLE NANO U/F 32G X 4 MM MISC USE FOR INSULIN ADMINISTRATION DAILY 100 each 3  . carvedilol (COREG) 6.25 MG tablet Take 1 tablet (6.25 mg total) by mouth 2 (two) times daily. 180 tablet 3  . cetirizine (ZYRTEC) 10 MG tablet Take 10 mg by mouth every morning.    . ciprofloxacin (CIPRO) 250 MG tablet Take 1 tablet (250 mg total) by mouth 2 (two) times daily. 10 tablet 0  . HUMALOG KWIKPEN 100 UNIT/ML KwikPen Inject 5-6 Units into the skin 3 (three) times daily.     . insulin glargine (LANTUS SOLOSTAR) 100 UNIT/ML Solostar Pen Inject 18 Units into the skin daily.    Marland Kitchen lidocaine-prilocaine (EMLA) cream Apply to affected area once 30 g 3  . ONETOUCH DELICA LANCETS 24P MISC Use lancet to stick finger and check blood sugar two times daily. 100 each 6  . ONETOUCH VERIO test strip USE AS DIRECTED. 300 each 2  .  pravastatin (PRAVACHOL) 40 MG tablet TAKE 1 TABLET BY MOUTH EVERY DAY 90 tablet 3  . prochlorperazine (COMPAZINE) 10 MG tablet Take 1 tablet (10 mg total) by mouth every 6 (six) hours as needed (Nausea or vomiting). (Patient not taking: Reported on 12/02/2020) 30 tablet 1   No current facility-administered medications for this encounter.     REVIEW OF SYSTEMS: On review of systems, the patient reports that she is doing well overall. She complains of sinus allergies and reports that it is usual for her to have trouble breathing thru her nose despite this she does not like to mouth breath. She is able to sleep on her back if she props her head up with pillows. She denies any chest pain, shortness of breath, cough, fevers, chills, night sweats, unintended weight changes. She describes this type of breathing is normal for her and she thinks it's from allergies. She has not seen an ENT but feels she needs to have her turbinates reduced  but has put off being seen due to concerns about wanting to avoid surgery. She denies any bowel or bladder disturbances currently but is getting over a UTI. She  denies abdominal pain, nausea or vomiting. She denies any new musculoskeletal or joint aches or pains. A complete review of systems is obtained and is otherwise negative.     PHYSICAL EXAM:   Today's Vitals   12/04/20 0901  BP: 121/63  Pulse: 69  Resp: (!) 32  Temp: (!) 96.8 F (36 C)  TempSrc: Temporal  SpO2: 100%  Weight: 223 lb 6 oz (101.3 kg)  Height: $Remove'5\' 8"'[id number]$  (1.727 m)  PainSc: 0-No pain   Body mass index is 33.96 kg/m.  In general this is a well appearing caucasian female in no acute distress but she has rapid breathing through her nose that almost mimics being air hungry or like someone who has sleep apnea where her breathing starts and stops but seems truly limited to her nasal passage. She is not short of breath however and as above vitals were otherwise normal other than her tachypnea. She's alert  and oriented x4 and appropriate throughout the examination. Cardiopulmonary assessment is negative for acute distress and she exhibits normal effort otherwise.  Breast exam is deferred to simulation dept.    ECOG = 1  0 - Asymptomatic (Fully active, able to carry on all predisease activities without restriction)  1 - Symptomatic but completely ambulatory (Restricted in physically strenuous activity but ambulatory and able to carry out work of a light or sedentary nature. For example, light housework, office work)  2 - Symptomatic, <50% in bed during the day (Ambulatory and capable of all self care but unable to carry out any work activities. Up and about more than 50% of waking hours)  3 - Symptomatic, >50% in bed, but not bedbound (Capable of only limited self-care, confined to bed or chair 50% or more of waking hours)  4 - Bedbound (Completely disabled. Cannot carry on any self-care. Totally confined to bed or chair)  5 - Death   Eustace Pen MM, Creech RH, Tormey DC, et al. [phone]). "Toxicity and response criteria of the [facility] Group". Jeff Oncol. 5 (6): 649-55    LABORATORY DATA:  Lab Results  Component Value Date   WBC 7.6 11/18/2020   HGB 12.3 11/18/2020   HCT 35.5 (L) 11/18/2020   MCV 99.7 11/18/2020   PLT 107 (L) 11/18/2020   Lab Results  Component Value Date   NA 139 11/18/2020   K 4.7 11/18/2020   CL 105 11/18/2020   CO2 22 11/18/2020   Lab Results  Component Value Date   ALT 25 11/18/2020   AST 26 11/18/2020   ALKPHOS 105 11/18/2020   BILITOT 0.4 11/18/2020      RADIOGRAPHY: No results found.     IMPRESSION/PLAN: 1. 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 Dr. Lisbeth Renshaw discusses the pathology findings and reviews the nature of early stage breast disease. She has done well since surgery and is now finished with systemic chemotherapy. She would benefit from external radiotherapy to the  breast  to reduce risks of local recurrence followed by antiestrogen therapy. We discussed the risks, benefits, short, and long term effects of radiotherapy, as well as the curative intent, and the patient is interested in proceeding. Dr. Lisbeth Renshaw discusses the delivery and logistics of radiotherapy and recommends 4 weeks of radiotherapy to the right breast.Written consent is  obtained and placed in the chart, a copy was provided to the patient. She will simulation this morning. 2. Atypical nasal breathing. The patient is aware to go to the ED if her breathing acutely changed or if she felt short of breath. I strongly counseled her to get an evaluation with ENT to see if she truly has some type of nasal obstruction.  In a visit lasting 45 minutes, greater than 50% of the time was spent face to face discussing the patient's condition, in preparation for the discussion, and coordinating the patient's care.   The above documentation reflects my direct findings during this shared patient visit. Please see the separate note by Dr. Lisbeth Renshaw on this date for the remainder of the patient's plan of care.    Carola Rhine, [facility]   **Disclaimer: This note was dictated with voice recognition software. Similar sounding words can inadvertently be transcribed and this note may contain transcription errors which may not have been corrected upon publication of note.**

## 2020-12-10 ENCOUNTER — Encounter: Payer: Self-pay | Admitting: *Deleted

## 2020-12-10 DIAGNOSIS — Z510 Encounter for antineoplastic radiation therapy: Secondary | ICD-10-CM | POA: Diagnosis not present

## 2020-12-11 ENCOUNTER — Other Ambulatory Visit: Payer: Self-pay

## 2020-12-11 ENCOUNTER — Ambulatory Visit
Admission: RE | Admit: 2020-12-11 | Discharge: 2020-12-11 | Disposition: A | Discharge status: Home / Self Care | Payer: Medicare Other | Source: Ambulatory Visit | Attending: Radiation Oncology | Admitting: Radiation Oncology

## 2020-12-11 DIAGNOSIS — Z510 Encounter for antineoplastic radiation therapy: Secondary | ICD-10-CM | POA: Diagnosis not present

## 2020-12-12 ENCOUNTER — Ambulatory Visit
Admission: RE | Admit: 2020-12-12 | Discharge: 2020-12-12 | Disposition: A | Discharge status: Home / Self Care | Payer: Medicare Other | Source: Ambulatory Visit | Attending: Radiation Oncology | Admitting: Radiation Oncology

## 2020-12-12 DIAGNOSIS — Z510 Encounter for antineoplastic radiation therapy: Secondary | ICD-10-CM | POA: Diagnosis not present

## 2020-12-12 NOTE — Progress Notes (Signed)

## 2020-12-13 ENCOUNTER — Ambulatory Visit
Admission: RE | Admit: 2020-12-13 | Discharge: 2020-12-13 | Disposition: A | Discharge status: Home / Self Care | Payer: Medicare Other | Source: Ambulatory Visit | Attending: Radiation Oncology | Admitting: Radiation Oncology

## 2020-12-13 DIAGNOSIS — Z170 Estrogen receptor positive status [ER+]: Secondary | ICD-10-CM

## 2020-12-13 DIAGNOSIS — Z510 Encounter for antineoplastic radiation therapy: Secondary | ICD-10-CM | POA: Diagnosis not present

## 2020-12-13 MED ORDER — RADIAPLEXRX EX GEL: Freq: Once | CUTANEOUS | Status: AC

## 2020-12-13 MED ORDER — ALRA NON-METALLIC DEODORANT (RAD-ONC)
1.0000 "application " | Freq: Once | TOPICAL | Status: AC
  Administered 2020-12-13: 1 via TOPICAL

## 2020-12-16 ENCOUNTER — Other Ambulatory Visit: Payer: Self-pay

## 2020-12-16 ENCOUNTER — Ambulatory Visit
Admission: RE | Admit: 2020-12-16 | Discharge: 2020-12-16 | Disposition: A | Discharge status: Home / Self Care | Payer: Medicare Other | Source: Ambulatory Visit | Attending: Radiation Oncology | Admitting: Radiation Oncology

## 2020-12-16 DIAGNOSIS — Z510 Encounter for antineoplastic radiation therapy: Secondary | ICD-10-CM | POA: Diagnosis not present

## 2020-12-17 ENCOUNTER — Ambulatory Visit
Admission: RE | Admit: 2020-12-17 | Discharge: 2020-12-17 | Disposition: A | Discharge status: Home / Self Care | Payer: Medicare Other | Source: Ambulatory Visit | Attending: Radiation Oncology | Admitting: Radiation Oncology

## 2020-12-17 DIAGNOSIS — Z510 Encounter for antineoplastic radiation therapy: Secondary | ICD-10-CM | POA: Diagnosis not present

## 2020-12-18 ENCOUNTER — Ambulatory Visit: Payer: Medicare Other

## 2020-12-19 ENCOUNTER — Ambulatory Visit
Admission: RE | Admit: 2020-12-19 | Discharge: 2020-12-19 | Disposition: A | Discharge status: Home / Self Care | Payer: Medicare Other | Source: Ambulatory Visit | Attending: Radiation Oncology | Admitting: Radiation Oncology

## 2020-12-19 ENCOUNTER — Other Ambulatory Visit: Payer: Self-pay

## 2020-12-19 DIAGNOSIS — Z510 Encounter for antineoplastic radiation therapy: Secondary | ICD-10-CM | POA: Diagnosis not present

## 2020-12-20 ENCOUNTER — Other Ambulatory Visit: Payer: Self-pay

## 2020-12-20 ENCOUNTER — Ambulatory Visit
Admission: RE | Admit: 2020-12-20 | Discharge: 2020-12-20 | Disposition: A | Discharge status: Home / Self Care | Payer: Medicare Other | Source: Ambulatory Visit | Attending: Radiation Oncology | Admitting: Radiation Oncology

## 2020-12-20 DIAGNOSIS — Z510 Encounter for antineoplastic radiation therapy: Secondary | ICD-10-CM | POA: Diagnosis not present

## 2020-12-23 ENCOUNTER — Ambulatory Visit
Admission: RE | Admit: 2020-12-23 | Discharge: 2020-12-23 | Disposition: A | Discharge status: Home / Self Care | Payer: Medicare Other | Source: Ambulatory Visit | Attending: Radiation Oncology | Admitting: Radiation Oncology

## 2020-12-23 ENCOUNTER — Other Ambulatory Visit: Payer: Self-pay

## 2020-12-23 DIAGNOSIS — Z510 Encounter for antineoplastic radiation therapy: Secondary | ICD-10-CM | POA: Diagnosis not present

## 2020-12-24 ENCOUNTER — Ambulatory Visit
Admission: RE | Admit: 2020-12-24 | Discharge: 2020-12-24 | Disposition: A | Discharge status: Home / Self Care | Payer: Medicare Other | Source: Ambulatory Visit | Attending: Radiation Oncology | Admitting: Radiation Oncology

## 2020-12-24 ENCOUNTER — Other Ambulatory Visit: Payer: Self-pay

## 2020-12-24 DIAGNOSIS — Z510 Encounter for antineoplastic radiation therapy: Secondary | ICD-10-CM | POA: Diagnosis not present

## 2020-12-25 ENCOUNTER — Ambulatory Visit
Admission: RE | Admit: 2020-12-25 | Discharge: 2020-12-25 | Disposition: A | Discharge status: Home / Self Care | Payer: Medicare Other | Source: Ambulatory Visit | Attending: Radiation Oncology | Admitting: Radiation Oncology

## 2020-12-25 DIAGNOSIS — Z510 Encounter for antineoplastic radiation therapy: Secondary | ICD-10-CM | POA: Diagnosis not present

## 2020-12-26 ENCOUNTER — Other Ambulatory Visit: Payer: Self-pay

## 2020-12-26 ENCOUNTER — Ambulatory Visit
Admission: RE | Admit: 2020-12-26 | Discharge: 2020-12-26 | Disposition: A | Discharge status: Home / Self Care | Payer: Medicare Other | Source: Ambulatory Visit | Attending: Radiation Oncology | Admitting: Radiation Oncology

## 2020-12-26 DIAGNOSIS — Z510 Encounter for antineoplastic radiation therapy: Secondary | ICD-10-CM | POA: Diagnosis not present

## 2020-12-27 ENCOUNTER — Other Ambulatory Visit: Payer: Self-pay

## 2020-12-27 ENCOUNTER — Ambulatory Visit: Payer: Medicare Other | Admitting: Radiation Oncology

## 2020-12-27 ENCOUNTER — Ambulatory Visit
Admission: RE | Admit: 2020-12-27 | Discharge: 2020-12-27 | Disposition: A | Discharge status: Home / Self Care | Payer: Medicare Other | Source: Ambulatory Visit | Attending: Radiation Oncology | Admitting: Radiation Oncology

## 2020-12-27 DIAGNOSIS — Z510 Encounter for antineoplastic radiation therapy: Secondary | ICD-10-CM | POA: Diagnosis present

## 2020-12-27 DIAGNOSIS — C50411 Malignant neoplasm of upper-outer quadrant of right female breast: Secondary | ICD-10-CM | POA: Diagnosis not present

## 2020-12-27 DIAGNOSIS — Z170 Estrogen receptor positive status [ER+]: Secondary | ICD-10-CM | POA: Insufficient documentation

## 2020-12-30 ENCOUNTER — Ambulatory Visit
Admission: RE | Admit: 2020-12-30 | Discharge: 2020-12-30 | Disposition: A | Discharge status: Home / Self Care | Payer: Medicare Other | Source: Ambulatory Visit | Attending: Radiation Oncology | Admitting: Radiation Oncology

## 2020-12-30 ENCOUNTER — Other Ambulatory Visit: Payer: Self-pay

## 2020-12-30 DIAGNOSIS — Z510 Encounter for antineoplastic radiation therapy: Secondary | ICD-10-CM | POA: Diagnosis not present

## 2020-12-31 ENCOUNTER — Ambulatory Visit
Admission: RE | Admit: 2020-12-31 | Discharge: 2020-12-31 | Disposition: A | Discharge status: Home / Self Care | Payer: Medicare Other | Source: Ambulatory Visit | Attending: Radiation Oncology | Admitting: Radiation Oncology

## 2020-12-31 DIAGNOSIS — Z510 Encounter for antineoplastic radiation therapy: Secondary | ICD-10-CM | POA: Diagnosis not present

## 2021-01-01 ENCOUNTER — Other Ambulatory Visit: Payer: Self-pay

## 2021-01-01 ENCOUNTER — Ambulatory Visit
Admission: RE | Admit: 2021-01-01 | Discharge: 2021-01-01 | Disposition: A | Discharge status: Home / Self Care | Payer: Medicare Other | Source: Ambulatory Visit | Attending: Radiation Oncology | Admitting: Radiation Oncology

## 2021-01-01 DIAGNOSIS — Z510 Encounter for antineoplastic radiation therapy: Secondary | ICD-10-CM | POA: Diagnosis not present

## 2021-01-02 ENCOUNTER — Ambulatory Visit
Admission: RE | Admit: 2021-01-02 | Discharge: 2021-01-02 | Disposition: A | Discharge status: Home / Self Care | Payer: Medicare Other | Source: Ambulatory Visit | Attending: Radiation Oncology | Admitting: Radiation Oncology

## 2021-01-02 ENCOUNTER — Other Ambulatory Visit: Payer: Self-pay

## 2021-01-02 ENCOUNTER — Ambulatory Visit: Payer: Medicare Other

## 2021-01-02 DIAGNOSIS — Z510 Encounter for antineoplastic radiation therapy: Secondary | ICD-10-CM | POA: Diagnosis not present

## 2021-01-03 ENCOUNTER — Ambulatory Visit: Payer: Medicare Other

## 2021-01-03 ENCOUNTER — Ambulatory Visit
Admission: RE | Admit: 2021-01-03 | Discharge: 2021-01-03 | Disposition: A | Discharge status: Home / Self Care | Payer: Medicare Other | Source: Ambulatory Visit | Attending: Radiation Oncology | Admitting: Radiation Oncology

## 2021-01-03 DIAGNOSIS — Z510 Encounter for antineoplastic radiation therapy: Secondary | ICD-10-CM | POA: Diagnosis not present

## 2021-01-06 ENCOUNTER — Encounter: Payer: Self-pay | Admitting: *Deleted

## 2021-01-06 ENCOUNTER — Other Ambulatory Visit: Payer: Self-pay

## 2021-01-06 ENCOUNTER — Ambulatory Visit
Admission: RE | Admit: 2021-01-06 | Discharge: 2021-01-06 | Disposition: A | Discharge status: Home / Self Care | Payer: Medicare Other | Source: Ambulatory Visit | Attending: Radiation Oncology | Admitting: Radiation Oncology

## 2021-01-06 DIAGNOSIS — Z510 Encounter for antineoplastic radiation therapy: Secondary | ICD-10-CM | POA: Diagnosis not present

## 2021-01-07 ENCOUNTER — Ambulatory Visit
Admission: RE | Admit: 2021-01-07 | Discharge: 2021-01-07 | Disposition: A | Discharge status: Home / Self Care | Payer: Medicare Other | Source: Ambulatory Visit | Attending: Radiation Oncology | Admitting: Radiation Oncology

## 2021-01-07 ENCOUNTER — Other Ambulatory Visit: Payer: Self-pay

## 2021-01-07 ENCOUNTER — Ambulatory Visit: Payer: Medicare Other

## 2021-01-07 DIAGNOSIS — Z510 Encounter for antineoplastic radiation therapy: Secondary | ICD-10-CM | POA: Diagnosis not present

## 2021-01-08 ENCOUNTER — Encounter: Payer: Self-pay | Admitting: Radiation Oncology

## 2021-01-08 ENCOUNTER — Ambulatory Visit
Admission: RE | Admit: 2021-01-08 | Discharge: 2021-01-08 | Disposition: A | Discharge status: Home / Self Care | Payer: Medicare Other | Source: Ambulatory Visit | Attending: Radiation Oncology | Admitting: Radiation Oncology

## 2021-01-08 DIAGNOSIS — Z510 Encounter for antineoplastic radiation therapy: Secondary | ICD-10-CM | POA: Diagnosis not present

## 2021-01-08 NOTE — Progress Notes (Signed)
                                                                                                                                                                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  Intent: Curative  Radiation Treatment Dates: 12/11/2020 through 01/08/2021 Site Technique Total Dose (Gy) Dose per Fx (Gy) Completed Fx Beam Energies  Breast, Right: Breast_Rt 3D 42.56/42.56 2.66 16/16 6X, 10X  Breast, Right: Breast_Rt_Bst specialPort 8/8 2 4/4 15E   Narrative: The patient tolerated radiation therapy relatively well. She noted hyperpigmentation and tenderness under the right breast during treatment.   Plan: The patient will receive a call in about one month from the radiation oncology department. She will continue follow up with Dr. Jana Hakim as well.   ________________________________________________    Carola Rhine, [facility]

## 2021-01-15 ENCOUNTER — Ambulatory Visit (INDEPENDENT_AMBULATORY_CARE_PROVIDER_SITE_OTHER): Payer: Medicare Other | Admitting: Otolaryngology

## 2021-01-15 ENCOUNTER — Other Ambulatory Visit: Payer: Self-pay

## 2021-01-15 ENCOUNTER — Encounter (INDEPENDENT_AMBULATORY_CARE_PROVIDER_SITE_OTHER): Payer: Self-pay | Admitting: Otolaryngology

## 2021-01-15 VITALS — Temp 94.1°F

## 2021-01-15 DIAGNOSIS — J310 Chronic rhinitis: Secondary | ICD-10-CM | POA: Diagnosis not present

## 2021-01-15 DIAGNOSIS — J342 Deviated nasal septum: Secondary | ICD-10-CM

## 2021-01-15 MED ORDER — TRIAMCINOLONE ACETONIDE 55 MCG/ACT NA AERO: 2.0000 | INHALATION_SPRAY | Freq: Every day | NASAL | 12 refills | Status: DC

## 2021-01-15 NOTE — Progress Notes (Signed)
HPI: Tasha Tucker is a 70 y.o. female who presents is referred by Shona Simpson, PA for evaluation of chronic nasal stuffiness which is generally worse on the left side.  She just recently completed radiation therapy for breast cancer.  She complains of the left nasal passageway being chronically clogged.  She is also has a lot of mucus in the nose but this is doing better she has difficulty blowing her nose.  Also complains of "sinus drainage" which is always worse on the left side but is presently doing little bit better.  She uses decongestant nasal drops on a regular basis at night in order to breathe.  She has done this for years.  She does not like to use sprays in the nose.  She has been prescribed Flonase in the past but this causes her to sneeze.Marland Kitchen  Past Medical History:  Diagnosis Date  . Bladder cancer ([place])    [place]  . Breast cancer ([place])   . DJD (degenerative joint disease)    NECK, NEEDS PILLOW WHEN LYING FLAT IS POSSIBLE  . History of cerebral aneurysm repair    1996-- S/P CLAPPING RIGHT CAROTID --- NO DEFICITS  . 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 Hyperlipidemia   . Hypertension   . Liver hemangioma   . OA (osteoarthritis)   . PAC (premature atrial contraction)   . PONV (postoperative nausea and vomiting)   . Right trigger finger   . Type 2 diabetes mellitus ([place])   . Wears glasses    Past Surgical History:  Procedure Laterality Date  . ABDOMINAL HYSTERECTOMY  1990   PARTIAL  . BREAST LUMPECTOMY WITH RADIOACTIVE SEED AND SENTINEL LYMPH NODE BIOPSY Right 04/17/2020   Procedure: BREAST LUMPECTOMY X 2  WITH RADIOACTIVE SEED AND SENTINEL LYMPH NODE BIOPSY;  Surgeon: Coralie Keens, MD;  Location: [place];  Service: General;  Laterality: Right;  . CATARACT EXTRACTION W/ INTRAOCULAR LENS  IMPLANT, BILATERAL  2016; 2017  . CEREBRAL ANEURYSM REPAIR  1996    clipped-no deficits RIGHT CAROTID  . CHOLECYSTECTOMY  2010  . CYSTOSCOPY W/ RETROGRADES Right 06/09/2017   Procedure: CYSTOSCOPY WITH RETROGRADE PYELOGRAM;  Surgeon: Alexis Frock, MD;  Location: [facility];  Service: Urology;  Laterality: Right;  . CYSTOSCOPY W/ RETROGRADES Right 07/21/2017   Procedure: CYSTOSCOPY WITH RETROGRADE PYELOGRAM;  Surgeon: Alexis Frock, MD;  Location: [facility];  Service: Urology;  Laterality: Right;  . CYSTOSCOPY WITH RETROGRADE PYELOGRAM, URETEROSCOPY AND STENT PLACEMENT Left 08/26/2016   Procedure: CYSTOSCOPY WITH BILATERAL RETROGRADE PYELOGRAM,LEFT URETEROSCOPY WITH BIOPSY AND LEFT STENT PLACEMENT;  Surgeon: Alexis Frock, MD;  Location[facility];  Service: Urology;  Laterality: Left;  . PORTACATH PLACEMENT Left 06/18/2020   Procedure: INSERTION PORT-A-CATH WITH ULTRASOUND GUIDANCE;  Surgeon: Coralie Keens, MD;  Location: [place];  Service: General;  Laterality: Left;  . ROBOT ASSITED LAPAROSCOPIC NEPHROURETERECTOMY Left 10/09/2016   Procedure: XI ROBOT ASSITED LAPAROSCOPIC NEPHROURETERECTOMY;  Surgeon: Alexis Frock, MD;  Location: [facility];  Service: Urology;  Laterality: Left;  . SURGERY ON FOOT FOR STAPH INFECTION  1989  . TRANSURETHRAL RESECTION OF BLADDER TUMOR N/A 07/21/2017   Procedure: TRANSURETHRAL RESECTION OF BLADDER TUMOR (TURBT);  Surgeon: Alexis Frock, MD;  Location: [facility];  Service: Urology;  Laterality: N/A;  . TRANSURETHRAL RESECTION OF BLADDER TUMOR WITH MITOMYCIN-C N/A 06/09/2017   Procedure: TRANSURETHRAL  RESECTION OF BLADDER TUMOR WITH MITOMYCIN-C;  Surgeon: Alexis Frock, MD;  Location[facility];  Service: Urology;  Laterality: N/A;  . TUBAL LIGATION  1980   Social History   Socioeconomic History  . Marital status: Married    Spouse name: Not on file  . Number of children: Not on file  . Years of education: Not on file  . Highest education  level: Not on file  Occupational History  . Not on file  Tobacco Use  . Smoking status: Never Smoker  . Smokeless tobacco: Never Used  Vaping Use  . Vaping Use: Never used  Substance and Sexual Activity  . Alcohol use: Not Currently    Comment: , STOPPED  . Drug use: No  . Sexual activity: Yes  Other Topics Concern  . Not on file  Social History Narrative  . Not on file   Social Determinants of Health   Financial Resource Strain: Low Risk   . Difficulty of Paying Living Expenses: Not hard at all  Food Insecurity: No Food Insecurity  . Worried About Charity fundraiser in the Last Year: Never true  . Ran Out of Food in the Last Year: Never true  Transportation Needs: No Transportation Needs  . Lack of Transportation (Medical): No  . Lack of Transportation (Non-Medical): No  Physical Activity: Not on file  Stress: Not on file  Social Connections: Not on file   Family History  Problem Relation Age of Onset  . Cancer Mother   . Hyperlipidemia Father   . Heart attack Father   . Diabetes Brother   . Breast cancer Neg Hx    Allergies  Allergen Reactions  . Other Other (See Comments)  . Adhesive [Tape] Rash    PAPER TAPE ONLY  . Chlorhexidine Gluconate Itching and Rash    Topical antiseptic (CHG WIPES)   . Dilantin [Phenytoin] Rash  . Penicillins Rash    Has patient had a PCN reaction causing immediate rash, facial/tongue/throat swelling, SOB or lightheadedness with hypotension:unsure Has patient had a PCN reaction causing severe rash involving mucus membranes or skin necrosis:unsure Has patient had a PCN reaction that required hospitalization:No Has patient had a PCN reaction occurring within the last 10 years:No If all of the above answers are "NO", then may proceed with Cephalosporin use.   . Sulfa Antibiotics Rash   Prior to Admission medications   Medication Sig Start Date End Date Taking? Authorizing Provider  triamcinolone (NASACORT) 55 MCG/ACT AERO nasal  inhaler Place 2 sprays into the nose daily. @ sprays each nostril at night. 01/15/21  Yes Rozetta Nunnery, MD  acetaminophen (TYLENOL) 500 MG tablet Take 500 mg by mouth at bedtime.     [provider]  anastrozole (ARIMIDEX) 1 MG tablet Take 1 tablet (1 mg total) by mouth daily. 04/03/20   Magrinat, Virgie Dad, MD  BD PEN NEEDLE NANO U/F 32G X 4 MM MISC USE FOR INSULIN ADMINISTRATION DAILY 03/02/18   Copland, Gay Filler, MD  carvedilol (COREG) 6.25 MG tablet Take 1 tablet (6.25 mg total) by mouth 2 (two) times daily. 05/01/20   Copland, Gay Filler, MD  cetirizine (ZYRTEC) 10 MG tablet Take 10 mg by mouth every morning.    [provider]  ciprofloxacin (CIPRO) 250 MG tablet Take 1 tablet (250 mg total) by mouth 2 (two) times daily. 12/02/20   Copland, Gay Filler, MD  HUMALOG KWIKPEN 100 UNIT/ML KwikPen Inject 5-6 Units into the skin 3 (three)  times daily.  01/17/20   [provider]  insulin glargine (LANTUS SOLOSTAR) 100 UNIT/ML Solostar Pen Inject 18 Units into the skin daily.    [provider]  lidocaine-prilocaine (EMLA) cream Apply to affected area once 05/21/20   Magrinat, Virgie Dad, MD  [facility] DELICA LANCETS 99I MISC Use lancet to stick finger and check blood sugar two times daily. 01/05/18   Copland, Gay Filler, MD  ONETOUCH VERIO test strip USE AS DIRECTED. 08/12/18   Copland, Gay Filler, MD  pravastatin (PRAVACHOL) 40 MG tablet TAKE 1 TABLET BY MOUTH EVERY DAY 11/13/20   Copland, Gay Filler, MD  prochlorperazine (COMPAZINE) 10 MG tablet Take 1 tablet (10 mg total) by mouth every 6 (six) hours as needed (Nausea or vomiting). Patient not taking: No sig reported 05/21/20   Magrinat, Virgie Dad, MD     Positive ROS: Otherwise negative  All other systems have been reviewed and were otherwise negative with the exception of those mentioned in the HPI and as above.  Physical Exam: Constitutional: Alert, well-appearing, no acute distress Ears: External ears without  lesions or tenderness. Ear canals are clear bilaterally with intact, clear TMs.  Nasal: External nose without lesions. Septum is slightly deviated to the left.  Mild rhinitis bilaterally..  Nasal endoscopy was performed in the office today.  On nasal endoscopy the middle meatus regions are clear bilaterally with no mucopurulent discharge and no significant edema.  No polyps noted.  The posterior nasal cavity is clear although she does have septal deviation to the left.  The nasopharynx is clear.  No mucopurulent discharge noted. Oral: Lips and gums without lesions. Tongue and palate mucosa without lesions. Posterior oropharynx clear.  No significant posterior oropharyngeal discharge noted. Neck: No palpable adenopathy or masses. Respiratory: Breathing comfortably  Skin: No facial/neck lesions or rash noted.  Procedures  Assessment: Chronic rhinitis with moderate septal deviation to the left. Symptoms of rhinitis medicamentosa as she chronically uses over-the-counter decongestant nasal drops at night.  Plan: Discussed with patient that her nasal passages are relatively clear although she has slight septal deviation to the left.  She would benefit from regular use of nasal steroid spray at night instead of decongestant drops.  For the mucus discharge recommend use of saline nasal rinse either AYR or Xlear brand during the day as needed for mucus drainage. Also prescribed Nasacort to use 2 sprays each nostril at night as this will help with nasal congestion without rebound. I discussed with her that presently there is no signs of active infection. There are no polyps or obstructive lesions within the nasal cavity otherwise.   Radene Journey, MD   CC:

## 2021-01-20 ENCOUNTER — Ambulatory Visit: Payer: Medicare Other | Attending: Surgery

## 2021-01-20 ENCOUNTER — Other Ambulatory Visit: Payer: Self-pay

## 2021-01-20 DIAGNOSIS — Z483 Aftercare following surgery for neoplasm: Secondary | ICD-10-CM | POA: Insufficient documentation

## 2021-01-20 NOTE — Therapy (Signed)
[place], [place], [address] Phone: [phone]   Fax:  [phone]  Physical Therapy Treatment  Patient Details  Name: Tasha Tucker MRN: [id number] Date of Birth: 27-Mar-1951 Referring Provider (PT): Dr. Coralie Keens   Encounter Date: 01/20/2021   PT End of Session - 01/20/21 0931    Visit Number 2   # unchanged due to screen only   Number of Visits 2    Date for PT Re-Evaluation 05/29/20    PT Start Time 0919    PT Stop Time 0927    PT Time Calculation (min) 8 min    Activity Tolerance Patient tolerated treatment well    Behavior During Therapy [facility] for tasks assessed/performed           Past Medical History:  Diagnosis Date  . Bladder cancer ([place])    [place]  . Breast cancer ([place])   . DJD (degenerative joint disease)    NECK, NEEDS PILLOW WHEN LYING FLAT IS POSSIBLE  . History of cerebral aneurysm repair    1996-- S/P CLAPPING RIGHT CAROTID --- NO DEFICITS  . 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 Hyperlipidemia   . Hypertension   . Liver hemangioma   . OA (osteoarthritis)   . PAC (premature atrial contraction)   . PONV (postoperative nausea and vomiting)   . Right trigger finger   . Type 2 diabetes mellitus ([place])   . Wears glasses     Past Surgical History:  Procedure Laterality Date  . ABDOMINAL HYSTERECTOMY  1990   PARTIAL  . BREAST LUMPECTOMY WITH RADIOACTIVE SEED AND SENTINEL LYMPH NODE BIOPSY Right 04/17/2020   Procedure: BREAST LUMPECTOMY X 2  WITH RADIOACTIVE SEED AND SENTINEL LYMPH NODE BIOPSY;  Surgeon: Coralie Keens, MD;  Location: [place];  Service: General;  Laterality: Right;  . CATARACT EXTRACTION W/ INTRAOCULAR LENS  IMPLANT, BILATERAL  2016; 2017  . CEREBRAL ANEURYSM REPAIR  1996   clipped-no deficits RIGHT CAROTID  . CHOLECYSTECTOMY  2010  .  CYSTOSCOPY W/ RETROGRADES Right 06/09/2017   Procedure: CYSTOSCOPY WITH RETROGRADE PYELOGRAM;  Surgeon: Alexis Frock, MD;  Location: [facility];  Service: Urology;  Laterality: Right;  . CYSTOSCOPY W/ RETROGRADES Right 07/21/2017   Procedure: CYSTOSCOPY WITH RETROGRADE PYELOGRAM;  Surgeon: Alexis Frock, MD;  Location: [facility];  Service: Urology;  Laterality: Right;  . CYSTOSCOPY WITH RETROGRADE PYELOGRAM, URETEROSCOPY AND STENT PLACEMENT Left 08/26/2016   Procedure: CYSTOSCOPY WITH BILATERAL RETROGRADE PYELOGRAM,LEFT URETEROSCOPY WITH BIOPSY AND LEFT STENT PLACEMENT;  Surgeon: Alexis Frock, MD;  Location[facility];  Service: Urology;  Laterality: Left;  . PORTACATH PLACEMENT Left 06/18/2020   Procedure: INSERTION PORT-A-CATH WITH ULTRASOUND GUIDANCE;  Surgeon: Coralie Keens, MD;  Location: [place];  Service: General;  Laterality: Left;  . ROBOT ASSITED LAPAROSCOPIC NEPHROURETERECTOMY Left 10/09/2016   Procedure: XI ROBOT ASSITED LAPAROSCOPIC NEPHROURETERECTOMY;  Surgeon: Alexis Frock, MD;  Location[facility];  Service: Urology;  Laterality: Left;  . SURGERY ON FOOT FOR STAPH INFECTION  1989  . TRANSURETHRAL RESECTION OF BLADDER TUMOR N/A 07/21/2017   Procedure: TRANSURETHRAL RESECTION OF BLADDER TUMOR (TURBT);  Surgeon: Alexis Frock, MD;  Location: [facility];  Service: Urology;  Laterality: N/A;  . TRANSURETHRAL RESECTION OF BLADDER TUMOR WITH MITOMYCIN-C N/A 06/09/2017   Procedure: TRANSURETHRAL RESECTION OF BLADDER TUMOR WITH MITOMYCIN-C;  Surgeon: Alexis Frock, MD;  Location: [facility];  Service: Urology;  Laterality: N/A;  . TUBAL LIGATION  1980    There were no vitals filed for this visit.   Subjective Assessment - 01/20/21 0921    Subjective Pt returns for her 3 month L-Dex screen.    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 She underwent a right double lumpectomy with a sentinel node biopsy (1 negative node) on 04/17/2020. It is ER/PR positive and HER2 negative with a Ki67 of 5%. She has a history of bladder and renal carcinoma from 06/2017.                  L-DEX FLOWSHEETS - 01/20/21 0900      L-DEX LYMPHEDEMA SCREENING   Measurement Type Unilateral    L-DEX MEASUREMENT EXTREMITY Upper Extremity    POSITION  Standing    DOMINANT SIDE Right    At Risk Side Right    BASELINE SCORE (UNILATERAL) 4.1    L-DEX SCORE (UNILATERAL) 0.6    VALUE CHANGE (UNILAT) -3.5                                  PT Long Term Goals - 05/13/20 1444      PT LONG TERM GOAL #1   Title Patient will dmeonstrate she has regained full shoulder ROM and function post operatively compared to baselines.    Time 8    Period Weeks    Status Achieved                 Plan - 01/20/21 0933    Clinical Impression Statement Pt returns for her 3 month L-Dex screen. Her change from baseline of -3.5 is WNLs so no further treatment is required at this time except to cpnt every 3 month L-Dex screens which pt is agreeable to.    PT Next Visit Plan Cont L-Dex screens every 3 months for up to 2 years from her SLNB, which will be around 04/17/22.    Consulted and Agree with Plan of Care Patient           Patient will benefit from skilled therapeutic intervention in order to improve the following deficits and impairments:     Visit Diagnosis: Aftercare following surgery for neoplasm     Problem List Patient Active Problem List   Diagnosis Date Noted  . Port-A-Cath in place 07/11/2020  . Malignant neoplasm of upper-outer quadrant of right breast in female, estrogen receptor positive ([place]) 03/28/2020  . Controlled type 2 diabetes mellitus with complication, with long-term current use of insulin ([place]) 03/19/2018  . Hyperlipidemia 01/12/2018  . Cerebral aneurysm 12/29/2017  . Secondary  hypertension, unspecified 12/29/2017  . CKD (chronic kidney disease) stage 4, GFR 15-29 ml/min ([facility]) 12/29/2017  . Cancer of renal pelvis, left ([place]) 10/12/2016  . Renal mass 10/09/2016    Otelia Limes, PTA 01/20/2021, 9:34 AM  [place], [place], [address] Phone: [phone]   Fax:  [phone]  Name: BREYA CASS MRN: [id number] Date of Birth: 1951-06-13

## 2021-02-13 NOTE — Progress Notes (Signed)
Tasha Tucker  [phone] [phone]     ID: OLEVA KOO DOB: [birth year]  MR#: [id number]  IRC#:[id number]  Patient Care Team: Darreld Mclean, MD as PCP - General (Family Medicine) Alexis Frock, MD as Consulting Physician (Urology) Rockwell Germany, RN as Oncology Nurse Navigator Mauro Kaufmann, RN as Oncology Nurse Navigator Coralie Keens, MD as Consulting Physician (General Surgery) Atley Neubert, Virgie Dad, MD as Consulting Physician (Oncology) Kyung Rudd, MD as Consulting Physician (Radiation Oncology) Madelin Rear, MD as Consulting Physician (Endocrinology) Madelon Lips, MD as Consulting Physician (Nephrology) Chauncey Cruel, MD OTHER MD:  CHIEF COMPLAINT: Estrogen receptor positive breast cancer  CURRENT TREATMENT: anastrozole   INTERVAL HISTORY: Tasha Tucker returns today for follow up of her estrogen receptor positive breast cancer.   She continues on anastrozole, which was started in 03/2020 in anticipation of surgical delays.  She generally tolerates this well, with no hot flashes or significant vaginal dryness.  Since her last visit, she was referred back to Dr. Lisbeth Renshaw on 12/04/2020 to review radiation therapy. She subsequently received treatment from 12/11/2020 through 01/08/2021.  Of note, she is followed by Dr. Tresa Moore for her history of urothelial carcinoma, s/Tucker resection in 09/2016. Her most recent surveillance cystoscopy from 09/2020 showed no evidence of recurrence.   REVIEW OF SYSTEMS: Tasha Tucker tells me her hair is coming in a bit fuzzy and she only has 1 area where it seems a little bit thin.  She is still wearing awake which she hates because it is very itchy.  She also has problems with the mask which causes her nose to run.  She exercises by walking but only on "very good days".  If there is any heat or range she does not exercise.  A detailed review of systems today was otherwise stable   COVID 19 VACCINATION STATUS:  fully vaccinated [employer]), with booster 04/2020   HISTORY OF CURRENT ILLNESS: From the original intake note:  Tasha Tucker had routine screening mammography on 03/13/2020 showing a possible abnormality in the right breast. She underwent right diagnostic mammography with tomography and right breast ultrasonography at [facility] on 03/26/2020 showing: breast density category B; suspicious 1.9 cm mass/distortion in right breast at 9 o'clock; no right axillary lymphadenopathy.  Accordingly that same day, she proceeded to biopsy of the right breast area in question. The pathology from this procedure ([id number]) showed: invasive and in situ mammary carcinoma, grade 2, e-cadherin negative. Prognostic indicators significant for: estrogen receptor, 90% positive and progesterone receptor, 90% positive, both with strong staining intensity. Proliferation marker Ki67 at 5%. HER2 negative by immunohistochemistry (1+).  The patient's subsequent history is as detailed below.   PAST MEDICAL HISTORY: Past Medical History:  Diagnosis Date  . Bladder cancer ([place])    [place]  . Breast cancer ([place])   . DJD (degenerative joint disease)    NECK, NEEDS PILLOW WHEN LYING FLAT IS POSSIBLE  . History of cerebral aneurysm repair    1996-- S/Tucker CLAPPING RIGHT CAROTID --- NO DEFICITS  . History of renal pelvis cancer    10-09-2016  S/Tucker  LEFT RADICAL NEPHROURETERECTOMY -- HIGH GRADE PAPILLARY UROTHELIAL CARCINOMA  . Hyperlipidemia   . Hypertension   . Liver hemangioma   . OA (osteoarthritis)   . PAC (premature atrial contraction)   . PONV (postoperative nausea and vomiting)   . Right trigger finger   . Type 2 diabetes mellitus ([place])   . Wears glasses  PAST SURGICAL HISTORY: Past Surgical History:  Procedure Laterality Date  . ABDOMINAL HYSTERECTOMY  1990   PARTIAL  . BREAST LUMPECTOMY WITH RADIOACTIVE SEED AND SENTINEL LYMPH NODE BIOPSY Right 04/17/2020   Procedure: BREAST LUMPECTOMY  X 2  WITH RADIOACTIVE SEED AND SENTINEL LYMPH NODE BIOPSY;  Surgeon: Coralie Keens, MD;  Location: [facility];  Service: General;  Laterality: Right;  . CATARACT EXTRACTION W/ INTRAOCULAR LENS  IMPLANT, BILATERAL  2016; 2017  . CEREBRAL ANEURYSM REPAIR  1996   clipped-no deficits RIGHT CAROTID  . CHOLECYSTECTOMY  2010  . CYSTOSCOPY W/ RETROGRADES Right 06/09/2017   Procedure: CYSTOSCOPY WITH RETROGRADE PYELOGRAM;  Surgeon: Alexis Frock, MD;  Location: [facility];  Service: Urology;  Laterality: Right;  . CYSTOSCOPY W/ RETROGRADES Right 07/21/2017   Procedure: CYSTOSCOPY WITH RETROGRADE PYELOGRAM;  Surgeon: Alexis Frock, MD;  Location: [facility];  Service: Urology;  Laterality: Right;  . CYSTOSCOPY WITH RETROGRADE PYELOGRAM, URETEROSCOPY AND STENT PLACEMENT Left 08/26/2016   Procedure: CYSTOSCOPY WITH BILATERAL RETROGRADE PYELOGRAM,LEFT URETEROSCOPY WITH BIOPSY AND LEFT STENT PLACEMENT;  Surgeon: Alexis Frock, MD;  Location: [facility];  Service: Urology;  Laterality: Left;  . PORTACATH PLACEMENT Left 06/18/2020   Procedure: INSERTION PORT-A-CATH WITH ULTRASOUND GUIDANCE;  Surgeon: Coralie Keens, MD;  Location: [facility];  Service: General;  Laterality: Left;  . ROBOT ASSITED LAPAROSCOPIC NEPHROURETERECTOMY Left 10/09/2016   Procedure: XI ROBOT ASSITED LAPAROSCOPIC NEPHROURETERECTOMY;  Surgeon: Alexis Frock, MD;  Location: [facility];  Service: Urology;  Laterality: Left;  . SURGERY ON FOOT FOR STAPH INFECTION  1989  . TRANSURETHRAL RESECTION OF BLADDER TUMOR N/A 07/21/2017   Procedure: TRANSURETHRAL RESECTION OF BLADDER TUMOR (TURBT);  Surgeon: Alexis Frock, MD;  Location: [facility];  Service: Urology;  Laterality: N/A;  . TRANSURETHRAL RESECTION OF BLADDER TUMOR WITH MITOMYCIN-C N/A 06/09/2017   Procedure: TRANSURETHRAL RESECTION OF BLADDER TUMOR WITH MITOMYCIN-C;  Surgeon: Alexis Frock, MD;  Location: [facility];  Service: Urology;  Laterality: N/A;  . TUBAL LIGATION  1980    FAMILY HISTORY: Family History  Problem Relation Age of Onset  . Cancer Mother   . Hyperlipidemia Father   . Heart attack Father   . Diabetes Brother   . Breast cancer Neg Hx    Her father died at age 54 from ASCVD. Her mother died at age 65. She reports her mother had a tumor in her thigh at age 15, possibly a sarcoma. The patient has one brother, with no history of cancer. Her son, Tasha Tucker, had brain cancer (oligodedroglioma, possibly) and is doing well now at age 53 (as of 03/2020).   GYNECOLOGIC HISTORY:  No LMP recorded. Patient has had a hysterectomy. Menarche: 70 years old Age at first live birth: 70 years old Tasha Tucker 3 LMP 1990 Contraceptive: used pills for 8 years without issues HRT never used  Hysterectomy? Yes, 1990 BSO? 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 Husband Tasha Tucker is a retired Garment/textile technologist for the [employer]. She lives at home with husband Tasha Tucker and their cat, Tasha Tucker. Daughter Tasha Tucker, age 72, works as a Careers adviser in [place]. Son Tasha Tucker, age 29, works in Human resources officer in [place]. Son Tasha Tucker, age 34, lives in [place] and was previously a paramedic. Loralei has 4 grandchildren. She attends a [employer].     ADVANCED DIRECTIVES: In the absence of any documentation to the contrary, the patient's  spouse is their HCPOA.    HEALTH MAINTENANCE: Social History   Tobacco Use  . Smoking status: Never Smoker  . Smokeless tobacco: Never Used  Vaping Use  . Vaping Use: Never used  Substance Use Topics  . Alcohol use: Not Currently    Comment: , STOPPED  . Drug use: No     Colonoscopy: Cologuard 2020 was negative  PAP: date unknown, s/Tucker hysterectomy  Bone density: 03/2018, +1.5   Allergies  Allergen Reactions  . Other Other (See Comments)  . Adhesive [Tape] Rash    PAPER TAPE ONLY  .  Chlorhexidine Gluconate Itching and Rash    Topical antiseptic (CHG WIPES)   . Dilantin [Phenytoin] Rash  . Penicillins Rash    Has patient had a PCN reaction causing immediate rash, facial/tongue/throat swelling, SOB or lightheadedness with hypotension:unsure Has patient had a PCN reaction causing severe rash involving mucus membranes or skin necrosis:unsure Has patient had a PCN reaction that required hospitalization:No Has patient had a PCN reaction occurring within the last 10 years:No If all of the above answers are "NO", then may proceed with Cephalosporin use.   . Sulfa Antibiotics Rash    Current Outpatient Medications  Medication Sig Dispense Refill  . acetaminophen (TYLENOL) 500 MG tablet Take 500 mg by mouth at bedtime.     Marland Kitchen anastrozole (ARIMIDEX) 1 MG tablet Take 1 tablet (1 mg total) by mouth daily. 90 tablet 4  . BD PEN NEEDLE NANO U/F 32G X 4 MM MISC USE FOR INSULIN ADMINISTRATION DAILY 100 each 3  . carvedilol (COREG) 6.25 MG tablet Take 1 tablet (6.25 mg total) by mouth 2 (two) times daily. 180 tablet 3  . cetirizine (ZYRTEC) 10 MG tablet Take 10 mg by mouth every morning.    . ciprofloxacin (CIPRO) 250 MG tablet Take 1 tablet (250 mg total) by mouth 2 (two) times daily. 10 tablet 0  . HUMALOG KWIKPEN 100 UNIT/ML KwikPen Inject 5-6 Units into the skin 3 (three) times daily.     . insulin glargine (LANTUS SOLOSTAR) 100 UNIT/ML Solostar Pen Inject 18 Units into the skin daily.    Marland Kitchen lidocaine-prilocaine (EMLA) cream Apply to affected area once 30 g 3  . ONETOUCH DELICA LANCETS 79J MISC Use lancet to stick finger and check blood sugar two times daily. 100 each 6  . ONETOUCH VERIO test strip USE AS DIRECTED. 300 each 2  . pravastatin (PRAVACHOL) 40 MG tablet TAKE 1 TABLET BY MOUTH EVERY DAY 90 tablet 3  . prochlorperazine (COMPAZINE) 10 MG tablet Take 1 tablet (10 mg total) by mouth every 6 (six) hours as needed (Nausea or vomiting). (Patient not taking: No sig reported) 30  tablet 1  . triamcinolone (NASACORT) 55 MCG/ACT AERO nasal inhaler Place 2 sprays into the nose daily. @ sprays each nostril at night. 1 each 12   No current facility-administered medications for this visit.    OBJECTIVE: White woman who appears stated age  20:   02/17/21 0950  BP: (!) 143/78  Pulse: 71  Resp: 18  Temp: (!) 97.2 F (36.2 C)  SpO2: 100%     Body mass index is 34.35 kg/m.   Wt Readings from Last 3 Encounters:  02/17/21 225 lb 14.4 oz (102.5 kg)  12/04/20 223 lb 6 oz (101.3 kg)  12/02/20 225 lb 9.6 oz (102.3 kg)      ECOG FS:1 - Symptomatic but completely ambulatory  Sclerae unicteric, EOMs intact Wearing a mask No cervical or supraclavicular  adenopathy Lungs no rales or rhonchi Heart regular rate and rhythm Abd soft, nontender, positive bowel sounds MSK no focal spinal tenderness, no upper extremity lymphedema Neuro: nonfocal, well oriented, appropriate affect Breasts: The right breast is status post lumpectomy and radiation.  The cosmetic result is good.  There is mild hyperpigmentation.  The left breast and both axillae are benign.   LAB RESULTS:  CMP     Component Value Date/Time   NA 138 02/17/2021 0918   NA 140 04/21/2019 0000   K 4.7 02/17/2021 0918   CL 107 02/17/2021 0918   CO2 24 02/17/2021 0918   GLUCOSE 160 (H) 02/17/2021 0918   BUN 29 (H) 02/17/2021 0918   BUN 36 (A) 08/30/2017 0000   CREATININE 1.82 (H) 02/17/2021 0918   CREATININE 1.78 (H) 06/19/2020 0842   CALCIUM 9.5 02/17/2021 0918   PROT 7.1 02/17/2021 0918   ALBUMIN 3.5 02/17/2021 0918   AST 23 02/17/2021 0918   AST 24 06/19/2020 0842   ALT 25 02/17/2021 0918   ALT 30 06/19/2020 0842   ALKPHOS 61 02/17/2021 0918   BILITOT 0.5 02/17/2021 0918   BILITOT 0.5 06/19/2020 0842   GFRNONAA 29 (L) 02/17/2021 0918   GFRNONAA 28 (L) 06/19/2020 0842   GFRAA 34 (L) 06/26/2020 1535   GFRAA 33 (L) 06/19/2020 0842    No results found for: TOTALPROTELP, ALBUMINELP, A1GS, A2GS,  BETS, BETA2SER, GAMS, MSPIKE, SPEI  Lab Results  Component Value Date   WBC 4.7 02/17/2021   NEUTROABS 3.4 02/17/2021   HGB 13.9 02/17/2021   HCT 40.1 02/17/2021   MCV 91.1 02/17/2021   PLT 129 (L) 02/17/2021    No results found for: LABCA2  No components found for: BEEFEO712  No results for input(s): INR in the last 168 hours.  No results found for: LABCA2  No results found for: RFX588  No results found for: TGP498  No results found for: YME158  No results found for: CA2729  No components found for: HGQUANT  No results found for: CEA1 / No results found for: CEA1   No results found for: AFPTUMOR  No results found for: CHROMOGRNA  No results found for: KPAFRELGTCHN, LAMBDASER, KAPLAMBRATIO (kappa/lambda light chains)  No results found for: HGBA, HGBA2QUANT, HGBFQUANT, HGBSQUAN (Hemoglobinopathy evaluation)   No results found for: LDH  No results found for: IRON, TIBC, IRONPCTSAT (Iron and TIBC)  No results found for: FERRITIN  Urinalysis    Component Value Date/Time   COLORURINE YELLOW 08/06/2020 1414   APPEARANCEUR HAZY (A) 08/06/2020 1414   LABSPEC 1.009 08/06/2020 1414   PHURINE 6.0 08/06/2020 1414   GLUCOSEU NEGATIVE 08/06/2020 1414   HGBUR SMALL (A) 08/06/2020 1414   BILIRUBINUR negative 12/02/2020 1418   KETONESUR negative 12/02/2020 1418   KETONESUR NEGATIVE 08/06/2020 1414   PROTEINUR negative 12/02/2020 1418   PROTEINUR NEGATIVE 08/06/2020 1414   UROBILINOGEN 0.2 12/02/2020 1418   UROBILINOGEN 1.0 04/29/2013 1455   NITRITE Negative 12/02/2020 1418   NITRITE NEGATIVE 08/06/2020 1414   LEUKOCYTESUR Trace (A) 12/02/2020 1418   LEUKOCYTESUR LARGE (A) 08/06/2020 1414    STUDIES: No results found.   ELIGIBLE FOR AVAILABLE RESEARCH PROTOCOL: AET  ASSESSMENT: 70 y.o. [place] woman status post right breast upper outer quadrant biopsy 03/26/2020, for a clinical T1c N0, stage IA invasive lobular carcinoma, E-cadherin  negative, grade 2, estrogen and progesterone receptor positive, with an MIB-1 of 5% and no HER-2 amplification  (a) unable to obtain breast MRI due to  renal concern with contrast status post nephrectomy  (1) status post right lumpectomy and sentinel lymph node sampling 04/17/2020 for a pT2 pN0, stage IB invasive lobular carcinoma, grade 2, with negative margins  (a) a single right axillary lymph node was removed.  (2) Oncotype score of 29 predicts a risk of recurrence outside the breast within the next 9 years of 18% with antiestrogens only, and also a greater than 15% benefit from chemotherapy.  (3) chemotherapy consisting of cyclophosphamide, methotrexate and fluorouracil given every 21 days x 8, started 06/19/2020, completed 11/18/2020  (a) Udenyca added a day 3 beginning with cycle 2 secondary to treatment delays due to neutropenia  (4) adjuvant radiation 12/11/2020 through 01/08/2021 Site Technique Total Dose (Gy) Dose per Fx (Gy) Completed Fx Beam Energies  Breast, Right: Breast_Rt 3D 42.56/42.56 2.66 16/16 6X, 10X  Breast, Right: Breast_Rt_Bst specialPort 8/8 2 4/4 15E   (5) anastrozole started 04/03/2020 with anticipation of surgical delays  (a) bone density 03/30/2018 shows a T score of 1.5 (normal).  PLAN: Tasha Tucker will soon be a year out from definitive surgery for her breast cancer, with no evidence of disease recurrence.  This is favorable.    She is tolerating anastrozole well and the plan will be to continue that a total of 5 years.  She is still recovering from her treatment.  We are still waiting for vigorous hair growth to occur.  She is not yet in a regular exercise program.  She says she is a "cancer magnet" and wonders what other cancer she is going to have.  She does see a dermatologist and she gets Cologuard regularly.  Her kidneys are followed by Dr. Bess Harvest.  She will have her next mammogram in August and see me in September.  From that point we will consider going to  yearly visits alternating with her surgeon.  Total encounter time 25 minutes.Sarajane Jews C. Gaylene Moylan, MD 02/17/2021 10:13 AM Medical Oncology and Hematology [facility] [place], [place] [address] Tel. [phone]    Fax. [phone]   This document serves as a record of services personally performed by Lurline Del, MD. It was created on his behalf by Wilburn Mylar, a trained medical scribe. The creation of this record is based on the scribe's personal observations and the provider's statements to them.   I, Lurline Del MD, have reviewed the above documentation for accuracy and completeness, and I agree with the above.   *Total Encounter Time as defined by the Centers for Medicare and Medicaid Services includes, in addition to the face-to-face time of a patient visit (documented in the note above) non-face-to-face time: obtaining and reviewing outside history, ordering and reviewing medications, tests or procedures, care coordination (communications with other health care professionals or caregivers) and documentation in the medical record.

## 2021-02-17 ENCOUNTER — Other Ambulatory Visit: Payer: Self-pay

## 2021-02-17 ENCOUNTER — Inpatient Hospital Stay: Payer: Medicare Other

## 2021-02-17 ENCOUNTER — Inpatient Hospital Stay: Payer: Medicare Other | Attending: Oncology | Admitting: Oncology

## 2021-02-17 VITALS — BP 143/78 | HR 71 | Temp 97.2°F | Resp 18 | Ht 68.0 in | Wt 225.9 lb

## 2021-02-17 DIAGNOSIS — C652 Malignant neoplasm of left renal pelvis: Secondary | ICD-10-CM

## 2021-02-17 DIAGNOSIS — Z79899 Other long term (current) drug therapy: Secondary | ICD-10-CM | POA: Insufficient documentation

## 2021-02-17 DIAGNOSIS — Z79811 Long term (current) use of aromatase inhibitors: Secondary | ICD-10-CM | POA: Insufficient documentation

## 2021-02-17 DIAGNOSIS — E785 Hyperlipidemia, unspecified: Secondary | ICD-10-CM | POA: Insufficient documentation

## 2021-02-17 DIAGNOSIS — C50411 Malignant neoplasm of upper-outer quadrant of right female breast: Secondary | ICD-10-CM

## 2021-02-17 DIAGNOSIS — Z8551 Personal history of malignant neoplasm of bladder: Secondary | ICD-10-CM | POA: Diagnosis not present

## 2021-02-17 DIAGNOSIS — I10 Essential (primary) hypertension: Secondary | ICD-10-CM | POA: Insufficient documentation

## 2021-02-17 DIAGNOSIS — D696 Thrombocytopenia, unspecified: Secondary | ICD-10-CM

## 2021-02-17 DIAGNOSIS — Z170 Estrogen receptor positive status [ER+]: Secondary | ICD-10-CM | POA: Diagnosis not present

## 2021-02-17 DIAGNOSIS — M1990 Unspecified osteoarthritis, unspecified site: Secondary | ICD-10-CM | POA: Diagnosis not present

## 2021-02-17 DIAGNOSIS — E119 Type 2 diabetes mellitus without complications: Secondary | ICD-10-CM | POA: Insufficient documentation

## 2021-02-17 DIAGNOSIS — Z905 Acquired absence of kidney: Secondary | ICD-10-CM | POA: Insufficient documentation

## 2021-02-17 LAB — CBC WITH DIFFERENTIAL/PLATELET
Abs Immature Granulocytes: 0 10*3/uL (ref 0.00–0.07)
Basophils Absolute: 0.1 10*3/uL (ref 0.0–0.1)
Basophils Relative: 1 %
Eosinophils Absolute: 0.1 10*3/uL (ref 0.0–0.5)
Eosinophils Relative: 2 %
HCT: 40.1 % (ref 36.0–46.0)
Hemoglobin: 13.9 g/dL (ref 12.0–15.0)
Immature Granulocytes: 0 %
Lymphocytes Relative: 17 %
Lymphs Abs: 0.8 10*3/uL (ref 0.7–4.0)
MCH: 31.6 pg (ref 26.0–34.0)
MCHC: 34.7 g/dL (ref 30.0–36.0)
MCV: 91.1 fL (ref 80.0–100.0)
Monocytes Absolute: 0.4 10*3/uL (ref 0.1–1.0)
Monocytes Relative: 8 %
Neutro Abs: 3.4 10*3/uL (ref 1.7–7.7)
Neutrophils Relative %: 72 %
Platelets: 129 10*3/uL — ABNORMAL LOW (ref 150–400)
RBC: 4.4 MIL/uL (ref 3.87–5.11)
RDW: 11.8 % (ref 11.5–15.5)
WBC: 4.7 10*3/uL (ref 4.0–10.5)
nRBC: 0 % (ref 0.0–0.2)

## 2021-02-17 LAB — COMPREHENSIVE METABOLIC PANEL
ALT: 25 U/L (ref 0–44)
AST: 23 U/L (ref 15–41)
Albumin: 3.5 g/dL (ref 3.5–5.0)
Alkaline Phosphatase: 61 U/L (ref 38–126)
Anion gap: 7 (ref 5–15)
BUN: 29 mg/dL — ABNORMAL HIGH (ref 8–23)
CO2: 24 mmol/L (ref 22–32)
Calcium: 9.5 mg/dL (ref 8.9–10.3)
Chloride: 107 mmol/L (ref 98–111)
Creatinine, Ser: 1.82 mg/dL — ABNORMAL HIGH (ref 0.44–1.00)
GFR, Estimated: 29 mL/min — ABNORMAL LOW (ref >60)
Glucose, Bld: 160 mg/dL — ABNORMAL HIGH (ref 70–99)
Potassium: 4.7 mmol/L (ref 3.5–5.1)
Sodium: 138 mmol/L (ref 135–145)
Total Bilirubin: 0.5 mg/dL (ref 0.3–1.2)
Total Protein: 7.1 g/dL (ref 6.5–8.1)

## 2021-02-17 MED ORDER — ANASTROZOLE 1 MG PO TABS: 1.0000 mg | ORAL_TABLET | Freq: Every day | ORAL | 4 refills | Status: DC

## 2021-02-18 ENCOUNTER — Telehealth: Payer: Self-pay | Admitting: Oncology

## 2021-02-18 ENCOUNTER — Other Ambulatory Visit: Payer: Self-pay | Admitting: Surgery

## 2021-02-18 NOTE — Telephone Encounter (Signed)
Scheduled appointment per 05/23 los. Patient is aware. 

## 2021-02-25 ENCOUNTER — Other Ambulatory Visit: Payer: Self-pay

## 2021-02-25 ENCOUNTER — Ambulatory Visit (INDEPENDENT_AMBULATORY_CARE_PROVIDER_SITE_OTHER): Payer: Medicare Other | Admitting: Family Medicine

## 2021-02-25 ENCOUNTER — Encounter: Payer: Self-pay | Admitting: Family Medicine

## 2021-02-25 VITALS — BP 148/82 | HR 78 | Temp 98.7°F | Ht 68.0 in | Wt 227.1 lb

## 2021-02-25 DIAGNOSIS — N3001 Acute cystitis with hematuria: Secondary | ICD-10-CM

## 2021-02-25 DIAGNOSIS — R350 Frequency of micturition: Secondary | ICD-10-CM | POA: Diagnosis not present

## 2021-02-25 LAB — POC URINALSYSI DIPSTICK (AUTOMATED)
Bilirubin, UA: NEGATIVE
Blood, UA: POSITIVE
Glucose, UA: NEGATIVE
Ketones, UA: NEGATIVE
Nitrite, UA: NEGATIVE
Protein, UA: NEGATIVE
Spec Grav, UA: 1.015 (ref 1.010–1.025)
Urobilinogen, UA: 0.2 E.U./dL
pH, UA: 6 (ref 5.0–8.0)

## 2021-02-25 MED ORDER — CIPROFLOXACIN HCL 250 MG PO TABS: 250.0000 mg | ORAL_TABLET | Freq: Two times a day (BID) | ORAL | 0 refills | Status: DC

## 2021-02-25 NOTE — Progress Notes (Signed)
Chief Complaint  Patient presents with  . Urinary Tract Infection  . Dysuria    Tasha Tucker is a 70 y.o. female here for possible UTI.  Duration: 1 week. Symptoms: Dysuria, urinary retention Denies: urinary frequency, hematuria, urinary hesitancy, fever, nausea, vomiting and flank pain, vaginal discharge Hx of recurrent UTI? No Denies new sexual partners.  Past Medical History:  Diagnosis Date  . Bladder cancer ([place])    [place]  . Breast cancer ([place])   . DJD (degenerative joint disease)    NECK, NEEDS PILLOW WHEN LYING FLAT IS POSSIBLE  . History of cerebral aneurysm repair    1996-- S/P CLAPPING RIGHT CAROTID --- NO DEFICITS  . 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 Hyperlipidemia   . Hypertension   . Liver hemangioma   . OA (osteoarthritis)   . PAC (premature atrial contraction)   . PONV (postoperative nausea and vomiting)   . Right trigger finger   . Type 2 diabetes mellitus ([place])   . Wears glasses      BP (!) 148/82 (BP Location: Left Arm, Patient Position: Sitting, Cuff Size: Normal)   Pulse 78   Temp 98.7 F (37.1 C) (Oral)   Ht 5\' 8"  (1.727 m)   Wt 227 lb 2 oz (103 kg)   SpO2 93%   BMI 34.53 kg/m  General: Awake, alert, appears stated age Heart: RRR Lungs: CTAB, normal respiratory effort, no accessory muscle usage Abd: BS+, soft, NT, ND, no masses or organomegaly MSK: No CVA tenderness, neg Lloyd's sign Psych: Age appropriate judgment and insight  Acute cystitis with hematuria - Plan: ciprofloxacin (CIPRO) 250 MG tablet, POCT Urinalysis Dipstick (Automated), Urine Culture  Urinary frequency - Plan: ciprofloxacin (CIPRO) 250 MG tablet, POCT Urinalysis Dipstick (Automated), Urine Culture  UA indicative of infection. Will rx Cipro as she has done well with this in the past.  Stay hydrated. Seek immediate care if pt starts to develop fevers,  new/worsening symptoms, uncontrollable N/V. F/u prn. The patient voiced understanding and agreement to the plan.  [place], DO 02/25/21 4:51 PM

## 2021-02-25 NOTE — Patient Instructions (Signed)
Stay hydrated.   Warning signs/symptoms: Uncontrollable nausea/vomiting, fevers, worsening symptoms despite treatment, confusion.  Give us around 2 business days to get culture back to you.  Let us know if you need anything. 

## 2021-02-26 LAB — URINE CULTURE
MICRO NUMBER:: 11950097
SPECIMEN QUALITY:: ADEQUATE

## 2021-03-05 ENCOUNTER — Encounter: Payer: Self-pay | Admitting: Family Medicine

## 2021-03-05 DIAGNOSIS — U071 COVID-19: Secondary | ICD-10-CM

## 2021-03-06 MED ORDER — MOLNUPIRAVIR EUA 200MG CAPSULE: 4.0000 | ORAL_CAPSULE | Freq: Two times a day (BID) | ORAL | 0 refills | Status: AC

## 2021-03-19 ENCOUNTER — Other Ambulatory Visit: Payer: Self-pay

## 2021-03-19 ENCOUNTER — Encounter (HOSPITAL_BASED_OUTPATIENT_CLINIC_OR_DEPARTMENT_OTHER): Payer: Self-pay | Admitting: Surgery

## 2021-03-19 DIAGNOSIS — Z973 Presence of spectacles and contact lenses: Secondary | ICD-10-CM

## 2021-03-19 HISTORY — DX: Presence of spectacles and contact lenses: Z97.3

## 2021-03-19 NOTE — Progress Notes (Signed)
Spoke w/ via phone for pre-op interview---pt Lab needs dos---- I stat               Lab results------ekg 04-11-2020 epic COVID test -----patient states asymptomatic no test needed, positive covid home test 03-05-2021 all symptoms resolved per pt Arrive at -------1215 pm 03-24-2021 NPO after MN NO Solid Food.  Clear liquids from MN until---1115 am then npo Med rec completed Medications to take morning of surgery -----anastrozol, zyrtec, carvedilol,  Diabetic medication -----none day of surgery, take 1/2 dose pm lantus night before surgery ( take 9 units lantus) Patient instructed no nail polish to be worn day of surgery Patient instructed to bring photo id and insurance card day of surgery Patient aware to have Driver (ride ) / caregiver  spouse Tasha Tucker  for 24 hours after surgery  Patient Special Instructions -----none Pre-Op special Istructions -----none Patient verbalized understanding of instructions that were given at this phone interview. Patient denies shortness of breath, chest pain, fever, cough at this phone interview.

## 2021-03-23 NOTE — H&P (Signed)
Tasha Tucker is an 70 y.o. female.   Chief Complaint: history of breast cancer HPI: she has completed treatment for breast cancer and her port a cath is no longer needed.  Request was made to remove the port.  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[place])    [name]   Breast cancer ([place]) 2021   right lumpectomy and chemo and radiation done   DJD (degenerative joint disease)    NECK, NEEDS PILLOW WHEN LYING FLAT IS POSSIBLE   History of cerebral aneurysm repair    1996-- S/P CLAPPING RIGHT CAROTID --- NO DEFICITS   History of renal pelvis cancer    10-09-2016  S/P  LEFT RADICAL NEPHROURETERECTOMY -- HIGH GRADE PAPILLARY UROTHELIAL CARCINOMA   Hyperlipidemia    Hypertension    Liver hemangioma    yrs ago per pt on 03-19-2021   OA (osteoarthritis)    PONV (postoperative nausea and vomiting)    Type 2 diabetes mellitus ([place])    UTI (urinary tract infection)    finished antibiotics 03-01-2021 all symptoms resolved   Wears glasses 03/19/2021    Past Surgical History:  Procedure Laterality Date   ABDOMINAL HYSTERECTOMY  1990   PARTIAL   BREAST LUMPECTOMY WITH RADIOACTIVE SEED AND SENTINEL LYMPH NODE BIOPSY Right 04/17/2020   Procedure: BREAST LUMPECTOMY X 2  WITH RADIOACTIVE SEED AND SENTINEL LYMPH NODE BIOPSY;  Surgeon: Coralie Keens, MD;  Location: [place];  Service: General;  Laterality: Right;   CATARACT EXTRACTION W/ INTRAOCULAR LENS  IMPLANT, BILATERAL  2016; 2017   CEREBRAL ANEURYSM REPAIR  1996   clipped-no deficits RIGHT CAROTID   CHOLECYSTECTOMY  2010   laparoscopic   CYSTOSCOPY W/ RETROGRADES Right 06/09/2017   Procedure: CYSTOSCOPY WITH RETROGRADE PYELOGRAM;  Surgeon: Alexis Frock, MD;  Location: [place];  Service: Urology;  Laterality: Right;   CYSTOSCOPY W/ RETROGRADES Right 07/21/2017   Procedure: CYSTOSCOPY WITH RETROGRADE PYELOGRAM;  Surgeon: Alexis Frock, MD;  Location: [facility];  Service: Urology;  Laterality: Right;   CYSTOSCOPY WITH RETROGRADE PYELOGRAM, URETEROSCOPY AND STENT PLACEMENT Left 08/26/2016   Procedure: CYSTOSCOPY WITH BILATERAL RETROGRADE PYELOGRAM,LEFT URETEROSCOPY WITH BIOPSY AND LEFT STENT PLACEMENT;  Surgeon: Alexis Frock, MD;  Location: [facility];  Service: Urology;  Laterality: Left;   PORTACATH PLACEMENT Left 06/18/2020   Procedure: INSERTION PORT-A-CATH WITH ULTRASOUND GUIDANCE;  Surgeon: Coralie Keens, MD;  Location: [place];  Service: General;  Laterality: Left;   ROBOT ASSITED LAPAROSCOPIC NEPHROURETERECTOMY Left 10/09/2016   Procedure: XI ROBOT ASSITED LAPAROSCOPIC NEPHROURETERECTOMY;  Surgeon: Alexis Frock, MD;  Location: [facility];  Service: Urology;  Laterality: Left;   SURGERY ON FOOT FOR STAPH INFECTION  1989   TRANSURETHRAL RESECTION OF BLADDER TUMOR N/A 07/21/2017   Procedure: TRANSURETHRAL RESECTION OF BLADDER TUMOR (TURBT);  Surgeon: Alexis Frock, MD;  Location: [facility];  Service: Urology;  Laterality: N/A;   TRANSURETHRAL RESECTION OF BLADDER TUMOR WITH MITOMYCIN-C N/A 06/09/2017   Procedure: TRANSURETHRAL RESECTION OF BLADDER TUMOR WITH MITOMYCIN-C;  Surgeon: Alexis Frock, MD;  Location: [facility];  Service: Urology;  Laterality: N/A;   TUBAL LIGATION  1980    Family History  Problem Relation Age of Onset   Cancer Mother    Hyperlipidemia Father    Heart attack Father    Diabetes Brother    Breast cancer Neg Hx    Social History:  reports that she  has never smoked. She has never used smokeless tobacco. She reports previous alcohol use. She reports that she does not use drugs.  Allergies:  Allergies  Allergen Reactions   Other Other (See Comments)   Adhesive [Tape] Rash    PAPER TAPE ONLY to use   Chlorhexidine Gluconate Itching and Rash    Topical antiseptic (CHG WIPES)    Dilantin [Phenytoin] Rash   Penicillins Rash    Has patient had a PCN reaction  causing immediate rash, facial/tongue/throat swelling, SOB or lightheadedness with hypotension:unsure Has patient had a PCN reaction causing severe rash involving mucus membranes or skin necrosis:unsure Has patient had a PCN reaction that required hospitalization:No Has patient had a PCN reaction occurring within the last 10 years:No If all of the above answers are "NO", then may proceed with Cephalosporin use.    Sulfa Antibiotics Rash    No medications prior to admission.    No results found for this or any previous visit (from the past 48 hour(s)). No results found.  Review of Systems  All other systems reviewed and are negative.   Height 5\' 8"  (1.727 m), weight 103 kg. Physical Exam Constitutional:      General: She is not in acute distress.    Appearance: Normal appearance.  HENT:     Head: Normocephalic and atraumatic.     Nose: Nose normal.  Eyes:     Pupils: Pupils are equal, round, and reactive to light.  Cardiovascular:     Rate and Rhythm: Normal rate and regular rhythm.  Pulmonary:     Effort: Pulmonary effort is normal.     Breath sounds: Normal breath sounds.  Musculoskeletal:        General: Normal range of motion.     Cervical back: Normal range of motion.  Skin:    General: Skin is warm and dry.  Neurological:     General: No focal deficit present.     Mental Status: She is alert.  Psychiatric:        Behavior: Behavior normal.     Assessment/Plan Port a cath no longer needed, treatment completed  Will proceed to the OR for port removal.  Risks were discussed in detail.  She agrees to proceed.  Coralie Keens, MD 03/23/2021, 8:41 AM

## 2021-03-24 ENCOUNTER — Ambulatory Visit
Admission: RE | Admit: 2021-03-24 | Discharge: 2021-03-24 | Disposition: A | Discharge status: Home / Self Care | Payer: Medicare Other | Source: Ambulatory Visit | Attending: Radiation Oncology | Admitting: Radiation Oncology

## 2021-03-24 ENCOUNTER — Ambulatory Visit (HOSPITAL_BASED_OUTPATIENT_CLINIC_OR_DEPARTMENT_OTHER): Payer: Medicare Other | Admitting: Anesthesiology

## 2021-03-24 ENCOUNTER — Encounter (HOSPITAL_BASED_OUTPATIENT_CLINIC_OR_DEPARTMENT_OTHER)
Admission: RE | Disposition: A | Discharge status: Home / Self Care | Payer: Self-pay | Source: Home / Self Care | Attending: Surgery

## 2021-03-24 ENCOUNTER — Other Ambulatory Visit: Payer: Self-pay

## 2021-03-24 ENCOUNTER — Ambulatory Visit (HOSPITAL_BASED_OUTPATIENT_CLINIC_OR_DEPARTMENT_OTHER)
Admission: RE | Admit: 2021-03-24 | Discharge: 2021-03-24 | Disposition: A | Discharge status: Home / Self Care | Payer: Medicare Other | Attending: Surgery | Admitting: Surgery

## 2021-03-24 ENCOUNTER — Encounter (HOSPITAL_BASED_OUTPATIENT_CLINIC_OR_DEPARTMENT_OTHER): Payer: Self-pay | Admitting: Surgery

## 2021-03-24 DIAGNOSIS — Z170 Estrogen receptor positive status [ER+]: Secondary | ICD-10-CM | POA: Insufficient documentation

## 2021-03-24 DIAGNOSIS — E782 Mixed hyperlipidemia: Secondary | ICD-10-CM

## 2021-03-24 DIAGNOSIS — Z452 Encounter for adjustment and management of vascular access device: Secondary | ICD-10-CM | POA: Insufficient documentation

## 2021-03-24 DIAGNOSIS — C50411 Malignant neoplasm of upper-outer quadrant of right female breast: Secondary | ICD-10-CM | POA: Insufficient documentation

## 2021-03-24 DIAGNOSIS — Z833 Family history of diabetes mellitus: Secondary | ICD-10-CM | POA: Diagnosis not present

## 2021-03-24 DIAGNOSIS — Z882 Allergy status to sulfonamides status: Secondary | ICD-10-CM | POA: Insufficient documentation

## 2021-03-24 DIAGNOSIS — Z853 Personal history of malignant neoplasm of breast: Secondary | ICD-10-CM | POA: Diagnosis not present

## 2021-03-24 DIAGNOSIS — E119 Type 2 diabetes mellitus without complications: Secondary | ICD-10-CM | POA: Insufficient documentation

## 2021-03-24 DIAGNOSIS — Z8349 Family history of other endocrine, nutritional and metabolic diseases: Secondary | ICD-10-CM | POA: Diagnosis not present

## 2021-03-24 DIAGNOSIS — Z8553 Personal history of malignant neoplasm of renal pelvis: Secondary | ICD-10-CM | POA: Insufficient documentation

## 2021-03-24 DIAGNOSIS — Z8249 Family history of ischemic heart disease and other diseases of the circulatory system: Secondary | ICD-10-CM | POA: Insufficient documentation

## 2021-03-24 DIAGNOSIS — Z888 Allergy status to other drugs, medicaments and biological substances status: Secondary | ICD-10-CM | POA: Insufficient documentation

## 2021-03-24 DIAGNOSIS — Z8551 Personal history of malignant neoplasm of bladder: Secondary | ICD-10-CM | POA: Diagnosis not present

## 2021-03-24 DIAGNOSIS — Z880 Allergy status to penicillin: Secondary | ICD-10-CM | POA: Insufficient documentation

## 2021-03-24 DIAGNOSIS — Z91048 Other nonmedicinal substance allergy status: Secondary | ICD-10-CM | POA: Insufficient documentation

## 2021-03-24 DIAGNOSIS — Z809 Family history of malignant neoplasm, unspecified: Secondary | ICD-10-CM | POA: Insufficient documentation

## 2021-03-24 DIAGNOSIS — C652 Malignant neoplasm of left renal pelvis: Secondary | ICD-10-CM

## 2021-03-24 HISTORY — PX: PORT-A-CATH REMOVAL: SHX5289

## 2021-03-24 HISTORY — DX: Urinary tract infection, site not specified: N39.0

## 2021-03-24 LAB — POCT I-STAT, CHEM 8
BUN: 32 mg/dL — ABNORMAL HIGH (ref 8–23)
Calcium, Ion: 1.34 mmol/L (ref 1.15–1.40)
Chloride: 105 mmol/L (ref 98–111)
Creatinine, Ser: 1.5 mg/dL — ABNORMAL HIGH (ref 0.44–1.00)
Glucose, Bld: 204 mg/dL — ABNORMAL HIGH (ref 70–99)
HCT: 43 % (ref 36.0–46.0)
Hemoglobin: 14.6 g/dL (ref 12.0–15.0)
Potassium: 4.8 mmol/L (ref 3.5–5.1)
Sodium: 141 mmol/L (ref 135–145)
TCO2: 23 mmol/L (ref 22–32)

## 2021-03-24 LAB — GLUCOSE, CAPILLARY: Glucose-Capillary: 180 mg/dL — ABNORMAL HIGH (ref 70–99)

## 2021-03-24 SURGERY — REMOVAL PORT-A-CATH
Anesthesia: Monitor Anesthesia Care | Site: Chest | Laterality: Left

## 2021-03-24 MED ORDER — TRAMADOL HCL 50 MG PO TABS: 50.0000 mg | ORAL_TABLET | Freq: Four times a day (QID) | ORAL | 0 refills | Status: DC | PRN

## 2021-03-24 MED ORDER — FENTANYL CITRATE (PF) 100 MCG/2ML IJ SOLN
INTRAMUSCULAR | Status: AC
  Filled 2021-03-24: qty 2

## 2021-03-24 MED ORDER — PROPOFOL 500 MG/50ML IV EMUL
INTRAVENOUS | Status: DC | PRN
  Administered 2021-03-24: 100 ug/kg/min via INTRAVENOUS

## 2021-03-24 MED ORDER — MIDAZOLAM HCL 2 MG/2ML IJ SOLN
INTRAMUSCULAR | Status: DC | PRN
  Administered 2021-03-24: 2 mg via INTRAVENOUS

## 2021-03-24 MED ORDER — ACETAMINOPHEN 500 MG PO TABS
ORAL_TABLET | ORAL | Status: AC
  Filled 2021-03-24: qty 2

## 2021-03-24 MED ORDER — LIDOCAINE HCL 1 % IJ SOLN
INTRAMUSCULAR | Status: AC
  Filled 2021-03-24: qty 20

## 2021-03-24 MED ORDER — CIPROFLOXACIN IN D5W 400 MG/200ML IV SOLN
400.0000 mg | INTRAVENOUS | Status: AC
  Administered 2021-03-24: 400 mg via INTRAVENOUS

## 2021-03-24 MED ORDER — MIDAZOLAM HCL 2 MG/2ML IJ SOLN
INTRAMUSCULAR | Status: AC
  Filled 2021-03-24: qty 2

## 2021-03-24 MED ORDER — ONDANSETRON HCL 4 MG/2ML IJ SOLN
INTRAMUSCULAR | Status: DC | PRN
  Administered 2021-03-24: 4 mg via INTRAVENOUS

## 2021-03-24 MED ORDER — SODIUM CHLORIDE 0.9 % IV SOLN: INTRAVENOUS | Status: DC

## 2021-03-24 MED ORDER — FENTANYL CITRATE (PF) 100 MCG/2ML IJ SOLN
INTRAMUSCULAR | Status: DC | PRN
  Administered 2021-03-24: 50 ug via INTRAVENOUS

## 2021-03-24 MED ORDER — ONDANSETRON HCL 4 MG/2ML IJ SOLN
INTRAMUSCULAR | Status: AC
  Filled 2021-03-24: qty 2

## 2021-03-24 MED ORDER — LIDOCAINE HCL (PF) 1 % IJ SOLN
INTRAMUSCULAR | Status: DC | PRN
  Administered 2021-03-24: 10 mL

## 2021-03-24 MED ORDER — ACETAMINOPHEN 500 MG PO TABS
1000.0000 mg | ORAL_TABLET | ORAL | Status: AC
  Administered 2021-03-24: 1000 mg via ORAL

## 2021-03-24 MED ORDER — CIPROFLOXACIN IN D5W 400 MG/200ML IV SOLN
INTRAVENOUS | Status: AC
  Filled 2021-03-24: qty 200

## 2021-03-24 SURGICAL SUPPLY — 36 items
ADH SKN CLS APL DERMABOND .7 (GAUZE/BANDAGES/DRESSINGS) ×1
APL PRP STRL LF DISP 70% ISPRP (MISCELLANEOUS)
BLADE SURG 15 STRL LF DISP TIS (BLADE) ×1 IMPLANT
BLADE SURG 15 STRL SS (BLADE) ×3
CHLORAPREP W/TINT 26 (MISCELLANEOUS) ×1 IMPLANT
COVER BACK TABLE 60X90IN (DRAPES) ×3 IMPLANT
COVER MAYO STAND STRL (DRAPES) ×3 IMPLANT
COVER WAND RF STERILE (DRAPES) IMPLANT
DECANTER SPIKE VIAL GLASS SM (MISCELLANEOUS) IMPLANT
DERMABOND ADVANCED (GAUZE/BANDAGES/DRESSINGS) ×2
DERMABOND ADVANCED .7 DNX12 (GAUZE/BANDAGES/DRESSINGS) ×1 IMPLANT
DRAPE LAPAROTOMY 100X72 PEDS (DRAPES) ×3 IMPLANT
DRAPE UTILITY XL STRL (DRAPES) ×3 IMPLANT
DURAPREP 26ML APPLICATOR (WOUND CARE) ×2 IMPLANT
ELECT REM PT RETURN 9FT ADLT (ELECTROSURGICAL) ×3
ELECTRODE REM PT RTRN 9FT ADLT (ELECTROSURGICAL) ×1 IMPLANT
GLOVE SURG ENC MOIS LTX SZ7 (GLOVE) ×2 IMPLANT
GLOVE SURG SIGNA 7.5 PF LTX (GLOVE) ×3 IMPLANT
GLOVE SURG UNDER POLY LF SZ7 (GLOVE) ×4 IMPLANT
GOWN STRL REUS W/ TWL LRG LVL3 (GOWN DISPOSABLE) ×1 IMPLANT
GOWN STRL REUS W/ TWL XL LVL3 (GOWN DISPOSABLE) ×1 IMPLANT
GOWN STRL REUS W/TWL LRG LVL3 (GOWN DISPOSABLE) ×3
GOWN STRL REUS W/TWL XL LVL3 (GOWN DISPOSABLE) ×3
KIT TURNOVER CYSTO (KITS) ×3 IMPLANT
NDL HYPO 25X1 1.5 SAFETY (NEEDLE) ×1 IMPLANT
NEEDLE HYPO 25X1 1.5 SAFETY (NEEDLE) ×3 IMPLANT
NS IRRIG 1000ML POUR BTL (IV SOLUTION) IMPLANT
PACK BASIN DAY SURGERY FS (CUSTOM PROCEDURE TRAY) ×3 IMPLANT
PENCIL SMOKE EVACUATOR (MISCELLANEOUS) ×3 IMPLANT
SLEEVE SCD COMPRESS KNEE MED (STOCKING) IMPLANT
SUT MNCRL AB 4-0 PS2 18 (SUTURE) ×3 IMPLANT
SUT VIC AB 3-0 SH 27 (SUTURE) ×3
SUT VIC AB 3-0 SH 27X BRD (SUTURE) ×1 IMPLANT
SYR BULB EAR ULCER 3OZ GRN STR (SYRINGE) IMPLANT
SYR CONTROL 10ML LL (SYRINGE) ×3 IMPLANT
TOWEL OR 17X26 10 PK STRL BLUE (TOWEL DISPOSABLE) ×3 IMPLANT

## 2021-03-24 NOTE — Op Note (Signed)
PORT-A-CATH REMOVAL  Procedure Note  Tasha Tucker 03/24/2021   Pre-op Diagnosis: HISTORY OF BREAST CANCER, PORT NO LONGER NEEDED     Post-op Diagnosis: same  Procedure(s): PORT-A-CATH REMOVAL  Surgeon(s): Coralie Keens, MD Lucas Mallow, MD  Anesthesia: Monitor Anesthesia Care  Staff:  Circulator: Ted Mcalpine, RN Scrub Person: Patric Dykes, RN Circulator Assistant: Charisse March, RN; McDonough-Hughes, Delene Ruffini, RN  Estimated Blood Loss: Minimal               Indications: This is a 70 year old female has completed treatment for breast cancer.  Her Port-A-Cath is no longer needed so the plan is to proceed with Port-A-Cath removal  Procedure: The patient was brought to the operating room identifies correct patient.  She is placed upon the operative table and anesthesia was induced.  Her left chest was prepped and draped in usual sterile fashion.  The skin over the previous incision was anesthetized with lidocaine.  We made an incision with a scalpel over this previous scar.  The dissection was then carried down with the cautery to the port which was easily identified.  The sutures of the port were cut and removed.  The Port-A-Cath and the catheter were then completely removed easily.  The tract from the catheter site was then closed with a figure-of-eight 3-0 Vicryl suture.  The subcutaneous tissue was closed with interrupted 3-0 Vicryl sutures and skin was closed with running 4-0 Monocryl.  Dermabond was then applied.  The patient tolerated the procedure well.  All the counts were correct at the end of the procedure.  The patient was then taken in stable condition from the operating room to the recovery room.          Coralie Keens   Date: 03/24/2021  Time: 1:42 PM

## 2021-03-24 NOTE — Anesthesia Preprocedure Evaluation (Addendum)
Anesthesia Evaluation  Patient identified by MRN, date of birth, ID band Patient awake    Reviewed: Allergy & Precautions, NPO status , Patient's Chart, lab work & pertinent test results, reviewed documented beta blocker date and time   History of Anesthesia Complications Negative for: history of anesthetic complications  Airway Mallampati: II  TM Distance: >3 FB Neck ROM: Full    Dental   Pulmonary neg pulmonary ROS,    Pulmonary exam normal        Cardiovascular hypertension, Pt. on medications and Pt. on home beta blockers Normal cardiovascular exam     Neuro/Psych Cerebral aneursym, repaired negative neurological ROS     GI/Hepatic negative GI ROS, Neg liver ROS,   Endo/Other  diabetes, Type 2, Insulin Dependent  Renal/GU CRFRenal disease (CKD4, Cr 1.50)  negative genitourinary   Musculoskeletal  (+) Arthritis , Osteoarthritis,    Abdominal   Peds  Hematology negative hematology ROS (+)   Anesthesia Other Findings Breast cancer  Reproductive/Obstetrics                           Anesthesia Physical Anesthesia Plan  ASA: 3  Anesthesia Plan: MAC   Post-op Pain Management:    Induction: Intravenous  PONV Risk Score and Plan: 2 and Propofol infusion, TIVA and Treatment may vary due to age or medical condition  Airway Management Planned: Natural Airway, Nasal Cannula and Simple Face Mask  Additional Equipment: None  Intra-op Plan:   Post-operative Plan:   Informed Consent: I have reviewed the patients History and Physical, chart, labs and discussed the procedure including the risks, benefits and alternatives for the proposed anesthesia with the patient or authorized representative who has indicated his/her understanding and acceptance.       Plan Discussed with:   Anesthesia Plan Comments:        Anesthesia Quick Evaluation

## 2021-03-24 NOTE — Interval H&P Note (Signed)
History and Physical Interval Note:no change in H and P  03/24/2021 11:19 AM  Tasha Tucker  has presented today for surgery, with the diagnosis of HISTORY OF BREAST CANCER, PORT NO LONGER NEEDED.  The various methods of treatment have been discussed with the patient and family. After consideration of risks, benefits and other options for treatment, the patient has consented to  Procedure(s): PORT-A-CATH REMOVAL (N/A) as a surgical intervention.  The patient's history has been reviewed, patient examined, no change in status, stable for surgery.  I have reviewed the patient's chart and labs.  Questions were answered to the patient's satisfaction.     Coralie Keens

## 2021-03-24 NOTE — Anesthesia Postprocedure Evaluation (Signed)
Anesthesia Post Note  Patient: Tasha Tucker  Procedure(s) Performed: PORT-A-CATH REMOVAL (Left: Chest)     Patient location during evaluation: PACU Anesthesia Type: MAC Level of consciousness: awake and alert Pain management: pain level controlled Vital Signs Assessment: post-procedure vital signs reviewed and stable Respiratory status: spontaneous breathing, nonlabored ventilation and respiratory function stable Cardiovascular status: blood pressure returned to baseline and stable Postop Assessment: no apparent nausea or vomiting Anesthetic complications: no   No notable events documented.  Last Vitals:  Vitals:   03/24/21 1345 03/24/21 1400  BP: (!) 143/63 (!) 156/68  Pulse: 62 (!) 58  Resp: 11 13  Temp: (!) 36.3 C   SpO2: 98% 97%    Last Pain:  Vitals:   03/24/21 1400  TempSrc:   PainSc: 0-No pain                 Lidia Collum

## 2021-03-24 NOTE — Discharge Instructions (Addendum)
Ok to shower starting tomorrow  Ice pack and tylenol for pain  No vigorous activity for one week   Post Anesthesia Home Care Instructions  Activity: Get plenty of rest for the remainder of the day. A responsible individual must stay with you for 24 hours following the procedure.  For the next 24 hours, DO NOT: -Drive a car -Paediatric nurse -Drink alcoholic beverages -Take any medication unless instructed by your physician -Make any legal decisions or sign important papers.  Meals: Start with liquid foods such as gelatin or soup. Progress to regular foods as tolerated. Avoid greasy, spicy, heavy foods. If nausea and/or vomiting occur, drink only clear liquids until the nausea and/or vomiting subsides. Call your physician if vomiting continues.  Special Instructions/Symptoms: Your throat may feel dry or sore from the anesthesia or the breathing tube placed in your throat during surgery. If this causes discomfort, gargle with warm salt water. The discomfort should disappear within 24 hours.

## 2021-03-24 NOTE — Transfer of Care (Signed)
Immediate Anesthesia Transfer of Care Note  Patient: Tasha Tucker  Procedure(s) Performed: PORT-A-CATH REMOVAL (Left: Chest)  Patient Location: PACU  Anesthesia Type:MAC  Level of Consciousness: awake, alert  and oriented  Airway & Oxygen Therapy: Patient Spontanous Breathing and Patient connected to face mask oxygen  Post-op Assessment: Report given to RN and Post -op Vital signs reviewed and stable  Post vital signs: Reviewed and stable  Last Vitals:  Vitals Value Taken Time  BP    Temp    Pulse    Resp    SpO2      Last Pain:  Vitals:   03/24/21 1113  TempSrc: Oral  PainSc: 0-No pain      Patients Stated Pain Goal: 4 (30/16/01 0932)  Complications: No notable events documented.

## 2021-03-25 NOTE — Progress Notes (Signed)
  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   Interval Since Last Radiation:  11 weeks   12/11/2020 through 01/08/2021 Site Technique Total Dose (Gy) Dose per Fx (Gy) Completed Fx Beam Energies  Breast, Right: Breast_Rt 3D 42.56/42.56 2.66 16/16 6X, 10X  Breast, Right: Breast_Rt_Bst specialPort 8/8 2 4/4 15E    Narrative:  The patient was contacted today for routine follow-up. During treatment she did very well with radiotherapy and did not have significant desquamation. She reports she is doing well. She has noticed resolution of her skin changes but still has shooting pains that are self limiting periodically.  Impression/Plan: 1.  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 The patient has been doing well since completion of radiotherapy. We discussed that we would be happy to continue to follow her as needed, but she will also continue to follow up with Dr. Jana Hakim in medical oncology. She was counseled on skin care as well as measures to avoid sun exposure to this area.  2. Survivorship. We discussed the importance of survivorship evaluation and encouraged her to attend her upcoming visit with that clinic.       Carola Rhine, PAC

## 2021-03-26 ENCOUNTER — Encounter (HOSPITAL_BASED_OUTPATIENT_CLINIC_OR_DEPARTMENT_OTHER): Payer: Self-pay | Admitting: Surgery

## 2021-04-04 ENCOUNTER — Other Ambulatory Visit (HOSPITAL_COMMUNITY): Payer: Self-pay | Admitting: Urology

## 2021-04-04 ENCOUNTER — Other Ambulatory Visit: Payer: Self-pay

## 2021-04-04 ENCOUNTER — Ambulatory Visit (HOSPITAL_COMMUNITY)
Admission: RE | Admit: 2021-04-04 | Discharge: 2021-04-04 | Disposition: A | Discharge status: Home / Self Care | Payer: Medicare Other | Source: Ambulatory Visit | Attending: Urology | Admitting: Urology

## 2021-04-04 DIAGNOSIS — C678 Malignant neoplasm of overlapping sites of bladder: Secondary | ICD-10-CM

## 2021-04-16 ENCOUNTER — Encounter (HOSPITAL_COMMUNITY): Payer: Self-pay

## 2021-04-21 ENCOUNTER — Other Ambulatory Visit: Payer: Self-pay

## 2021-04-21 ENCOUNTER — Ambulatory Visit: Payer: Medicare Other | Attending: Surgery

## 2021-04-21 VITALS — Wt 228.6 lb

## 2021-04-21 DIAGNOSIS — Z483 Aftercare following surgery for neoplasm: Secondary | ICD-10-CM | POA: Insufficient documentation

## 2021-04-21 NOTE — Therapy (Signed)
[place], [place], [address] Phone: [phone]   Fax:  [phone]  Physical Therapy Treatment  Patient Details  Name: Tasha Tucker MRN: [id number] Date of Birth: [birth year] Referring Provider (PT): Dr. 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[place])    [place]   Breast cancer ([place]) 2021   right lumpectomy and chemo and radiation done   DJD (degenerative joint disease)    NECK, NEEDS PILLOW WHEN LYING FLAT IS POSSIBLE   History of cerebral aneurysm repair    1996-- S/P CLAPPING RIGHT CAROTID --- NO DEFICITS   History of renal pelvis cancer    10-09-2016  S/P  LEFT RADICAL NEPHROURETERECTOMY -- HIGH GRADE PAPILLARY UROTHELIAL CARCINOMA   Hyperlipidemia    Hypertension    Liver hemangioma    yrs ago per pt on 03-19-2021   OA (osteoarthritis)    PONV (postoperative nausea and vomiting)    Type 2 diabetes mellitus ([place])    UTI (urinary tract infection)    finished antibiotics 03-01-2021 all symptoms resolved   Wears glasses 03/19/2021    Past Surgical History:  Procedure Laterality Date   ABDOMINAL HYSTERECTOMY  1990   PARTIAL   BREAST LUMPECTOMY WITH RADIOACTIVE SEED AND SENTINEL LYMPH NODE BIOPSY Right 04/17/2020   Procedure: BREAST LUMPECTOMY X 2  WITH RADIOACTIVE SEED AND SENTINEL LYMPH NODE BIOPSY;  Surgeon: Coralie Keens, MD;  Location: [place];  Service: General;  Laterality: Right;   CATARACT EXTRACTION W/ INTRAOCULAR LENS  IMPLANT, BILATERAL  2016; 2017   CEREBRAL ANEURYSM [place]   clipped-no deficits RIGHT  CAROTID   CHOLECYSTECTOMY  2010   laparoscopic   CYSTOSCOPY W/ RETROGRADES Right 06/09/2017   Procedure: CYSTOSCOPY WITH RETROGRADE PYELOGRAM;  Surgeon: Alexis Frock, MD;  Location: [place];  Service: Urology;  Laterality: Right;   CYSTOSCOPY W/ RETROGRADES Right 07/21/2017   Procedure: CYSTOSCOPY WITH RETROGRADE PYELOGRAM;  Surgeon: Alexis Frock, MD;  Location: [facility];  Service: Urology;  Laterality: Right;   CYSTOSCOPY WITH RETROGRADE PYELOGRAM, URETEROSCOPY AND STENT PLACEMENT Left 08/26/2016   Procedure: CYSTOSCOPY WITH BILATERAL RETROGRADE PYELOGRAM,LEFT URETEROSCOPY WITH BIOPSY AND LEFT STENT PLACEMENT;  Surgeon: Alexis Frock, MD;  Location: [facility];  Service: Urology;  Laterality: Left;   PORT-A-CATH REMOVAL Left 03/24/2021   Procedure: PORT-A-CATH REMOVAL;  Surgeon: Coralie Keens, MD;  Location: [facility];  Service: General;  Laterality: Left;   PORTACATH PLACEMENT Left 06/18/2020   Procedure: INSERTION PORT-A-CATH WITH ULTRASOUND GUIDANCE;  Surgeon: Coralie Keens, MD;  Location: [place];  Service: General;  Laterality: Left;   ROBOT ASSITED LAPAROSCOPIC NEPHROURETERECTOMY Left 10/09/2016   Procedure: XI ROBOT ASSITED LAPAROSCOPIC NEPHROURETERECTOMY;  Surgeon: Alexis Frock, MD;  Location[facility];  Service: Urology;  Laterality: Left;   SURGERY ON FOOT FOR STAPH INFECTION  1989   TRANSURETHRAL RESECTION OF BLADDER TUMOR N/A 07/21/2017   Procedure: TRANSURETHRAL RESECTION OF BLADDER TUMOR (TURBT);  Surgeon: Alexis Frock, MD;  Location: [facility];  Service: Urology;  Laterality: N/A;   TRANSURETHRAL RESECTION OF BLADDER TUMOR WITH MITOMYCIN-C N/A 06/09/2017   Procedure: TRANSURETHRAL RESECTION OF BLADDER TUMOR WITH MITOMYCIN-C;  Surgeon: Alexis Frock, MD;  Location: [facility];  Service: Urology;  Laterality: N/A;   TUBAL LIGATION  1980    Vitals:   04/21/21  0942  Weight: 228 lb 9.6 oz (103.7 kg)     Subjective Assessment - 04/21/21 0943     Subjective Pt returns for her 3 month L-Dex screen.    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 She underwent a right double lumpectomy with a sentinel node biopsy (1 negative node) on 04/17/2020. It is ER/PR positive and HER2 negative with a Ki67 of 5%. She has a history of bladder and renal carcinoma from 06/2017.                    L-DEX FLOWSHEETS - 04/21/21 0900       L-DEX LYMPHEDEMA SCREENING   Measurement Type Unilateral    L-DEX MEASUREMENT EXTREMITY Upper Extremity    POSITION  Standing    DOMINANT SIDE Right    At Risk Side Right    BASELINE SCORE (UNILATERAL) 4.1    L-DEX SCORE (UNILATERAL) 1.2    VALUE CHANGE (UNILAT) -2.9                                    PT Long Term Goals - 05/13/20 1444       PT LONG TERM GOAL #1   Title Patient will dmeonstrate she has regained full shoulder ROM and function post operatively compared to baselines.    Time 8    Period Weeks    Status Achieved                   Plan - 04/21/21 0946     Clinical Impression Statement Pt returns for her 3 month L-Dex screen. Her change from baseline of -2.9 is WNLs so no further treatment is required at this time except to cont every 3 month L-Dex screens which she is agreeable to.    PT Next Visit Plan Cont L-Dex screens every 3 months for up to 2 years from her SLNB, which will be around 04/17/22.    Consulted and Agree with Plan of Care Patient             Patient will benefit from skilled therapeutic intervention in order to improve the following deficits and impairments:     Visit Diagnosis: Aftercare following surgery for neoplasm     Problem List Patient Active Problem List   Diagnosis Date Noted   Thrombocytopenia ([place]) 02/17/2021   Port-A-Cath in place 07/11/2020    Malignant neoplasm of upper-outer quadrant of right breast in female, estrogen receptor positive ([place]) 03/28/2020   Controlled type 2 diabetes mellitus with complication, with long-term current use of insulin ([place]) 03/19/2018   Hyperlipidemia 01/12/2018   Cerebral aneurysm 12/29/2017   Secondary hypertension, unspecified 12/29/2017   CKD (chronic kidney disease) stage 4, GFR 15-29 ml/min ([facility]) 12/29/2017   Cancer of renal pelvis, left ([place]) 10/12/2016   Renal mass 10/09/2016    Otelia Limes, PTA 04/21/2021, 9:48 AM  [place], [place], [address]  Phone: [phone]   Fax:  [phone]  Name: Tasha Tucker MRN: [id number] Date of Birth: 1951/01/30

## 2021-04-28 ENCOUNTER — Other Ambulatory Visit: Payer: Self-pay

## 2021-04-28 ENCOUNTER — Ambulatory Visit
Admission: RE | Admit: 2021-04-28 | Discharge: 2021-04-28 | Disposition: A | Discharge status: Home / Self Care | Payer: Medicare Other | Source: Ambulatory Visit | Attending: Oncology | Admitting: Oncology

## 2021-04-28 DIAGNOSIS — Z170 Estrogen receptor positive status [ER+]: Secondary | ICD-10-CM

## 2021-04-29 NOTE — Progress Notes (Addendum)
[facility] [address] [place], [place] [address] [phone]  Date:  04/30/2021   Name:  Tasha Tucker   DOB:  [birth year]   MRN:  [id number]  PCP:  Darreld Mclean, MD    Chief Complaint: Diabetes ( 12months follow up/) and Hyperlipidemia   History of Present Illness:  Tasha Tucker is a 70 y.o. very pleasant female patient who presents with the following:  Patient seen today for 19-month follow-up visit Most recent visit with myself in March of this year History of breast cancer diagnosed 2021-her oncologist is Dr. Jana Hakim.  She was treated with definitive surgical management, completed radiation and is currently taking anastrozole-we will use this for 5 years Her port was removed in June  Also with history of diabetes, hyperlipidemia, stage IV chronic kidney disease, renal and bladder cancer, cerebral aneurysm status post repair, hypertension She had a left nephrectomy in 2018, brain aneurysm clipping in 1990 Nephrologist- Hollie Salk.  Last visit in May Urologist is Dr. Tresa Moore- she did have a CT recently, she is being seen later this month to discuss  Endocrinologist is Dr. Luster Landsberg recent visit July 20 She is currently using Lantus 18 units, Humalog KwikPen as needed with meals.  A1c 7.4%  She also had COVID-19 in June, I treated her with molnupiravir antiviral  She feels like she has recovered well from her covid 33  Needs foot exam Eye exam-patient gave me date of most recent exam She was told her vit D was low in May- she is taking OTC vitamin D now. Will check her level   She is not exercising as much due to the heat   She is planning to go to [facility] in the fall     Patient Active Problem List   Diagnosis Date Noted   Thrombocytopenia ([place]) 02/17/2021   Port-A-Cath in place 07/11/2020   Malignant neoplasm of upper-outer quadrant of right breast in female, estrogen receptor positive ([place])  03/28/2020   Controlled type 2 diabetes mellitus with complication, with long-term current use of insulin ([place]) 03/19/2018   Hyperlipidemia 01/12/2018   Cerebral aneurysm 12/29/2017   Secondary hypertension, unspecified 12/29/2017   CKD (chronic kidney disease) stage 4, GFR 15-29 ml/min ([place]) 12/29/2017   Cancer of renal pelvis, 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   Renal mass 10/09/2016    Past Medical History:  Diagnosis Date   Bladder cancer ([place])    UROLOGIST- [facility]   Breast cancer ([place]) 2021   right lumpectomy and chemo and radiation done   DJD (degenerative joint disease)    NECK, NEEDS PILLOW WHEN LYING FLAT IS POSSIBLE   History of cerebral aneurysm repair    1996-- S/P CLAPPING RIGHT CAROTID --- NO DEFICITS   History of renal pelvis cancer    10-09-2016  S/P  LEFT RADICAL NEPHROURETERECTOMY -- HIGH GRADE PAPILLARY UROTHELIAL CARCINOMA   Hyperlipidemia    Hypertension    Liver hemangioma    yrs ago per pt on 03-19-2021   OA (osteoarthritis)    PONV (postoperative nausea and vomiting)    Type 2 diabetes mellitus ([place])    UTI (urinary tract infection)    finished antibiotics 03-01-2021 all symptoms resolved   Wears glasses 03/19/2021    Past Surgical History:  Procedure Laterality Date   ABDOMINAL HYSTERECTOMY  1990   PARTIAL   BREAST LUMPECTOMY WITH RADIOACTIVE SEED AND SENTINEL LYMPH NODE BIOPSY Right 04/17/2020   Procedure:  BREAST LUMPECTOMY X 2  WITH RADIOACTIVE SEED AND SENTINEL LYMPH NODE BIOPSY;  Surgeon: Coralie Keens, MD;  Location: [facility];  Service: General;  Laterality: Right;   CATARACT EXTRACTION W/ INTRAOCULAR LENS  IMPLANT, BILATERAL  2016; 2017   CEREBRAL ANEURYSM REPAIR  1996   clipped-no deficits RIGHT CAROTID   CHOLECYSTECTOMY  2010   laparoscopic   CYSTOSCOPY W/ RETROGRADES Right 06/09/2017   Procedure: CYSTOSCOPY WITH RETROGRADE PYELOGRAM;  Surgeon: Alexis Frock, MD;  Location[facility];  Service: Urology;  Laterality: Right;    CYSTOSCOPY W/ RETROGRADES Right 07/21/2017   Procedure: CYSTOSCOPY WITH RETROGRADE PYELOGRAM;  Surgeon: Alexis Frock, MD;  Location: [facility];  Service: Urology;  Laterality: Right;   CYSTOSCOPY WITH RETROGRADE PYELOGRAM, URETEROSCOPY AND STENT PLACEMENT Left 08/26/2016   Procedure: CYSTOSCOPY WITH BILATERAL RETROGRADE PYELOGRAM,LEFT URETEROSCOPY WITH BIOPSY AND LEFT STENT PLACEMENT;  Surgeon: Alexis Frock, MD;  Location[facility];  Service: Urology;  Laterality: Left;   PORT-A-CATH REMOVAL Left 03/24/2021   Procedure: PORT-A-CATH REMOVAL;  Surgeon: Coralie Keens, MD;  Location[facility];  Service: General;  Laterality: Left;   PORTACATH PLACEMENT Left 06/18/2020   Procedure: INSERTION PORT-A-CATH WITH ULTRASOUND GUIDANCE;  Surgeon: Coralie Keens, MD;  Location: [place];  Service: General;  Laterality: Left;   ROBOT ASSITED LAPAROSCOPIC NEPHROURETERECTOMY Left 10/09/2016   Procedure: XI ROBOT ASSITED LAPAROSCOPIC NEPHROURETERECTOMY;  Surgeon: Alexis Frock, MD;  Location: [facility];  Service: Urology;  Laterality: Left;   SURGERY ON FOOT FOR STAPH INFECTION  1989   TRANSURETHRAL RESECTION OF BLADDER TUMOR N/A 07/21/2017   Procedure: TRANSURETHRAL RESECTION OF BLADDER TUMOR (TURBT);  Surgeon: Alexis Frock, MD;  Location: [facility];  Service: Urology;  Laterality: N/A;   TRANSURETHRAL RESECTION OF BLADDER TUMOR WITH MITOMYCIN-C N/A 06/09/2017   Procedure: TRANSURETHRAL RESECTION OF BLADDER TUMOR WITH MITOMYCIN-C;  Surgeon: Alexis Frock, MD;  Location[facility];  Service: Urology;  Laterality: N/A;   TUBAL LIGATION  1980    Social History   Tobacco Use   Smoking status: Never   Smokeless tobacco: Never  Vaping Use   Vaping Use: Never used  Substance Use Topics   Alcohol use: Not Currently    Comment: , STOPPED   Drug use: No    Family History  Problem Relation Age of Onset   Cancer  Mother    Hyperlipidemia Father    Heart attack Father    Diabetes Brother    Breast cancer Neg Hx     Allergies  Allergen Reactions   Other Other (See Comments)   Adhesive [Tape] Rash    PAPER TAPE ONLY to use   Chlorhexidine Gluconate Itching and Rash    Topical antiseptic (CHG WIPES)    Dilantin [Phenytoin] Rash   Penicillins Rash    Has patient had a PCN reaction causing immediate rash, facial/tongue/throat swelling, SOB or lightheadedness with hypotension:unsure Has patient had a PCN reaction causing severe rash involving mucus membranes or skin necrosis:unsure Has patient had a PCN reaction that required hospitalization:No Has patient had a PCN reaction occurring within the last 10 years:No If all of the above answers are "NO", then may proceed with Cephalosporin use.    Sulfa Antibiotics Rash    Medication list has been reviewed and updated.  Current Outpatient Medications on File Prior to Visit  Medication Sig Dispense Refill   acetaminophen (TYLENOL) 500 MG tablet Take 500 mg by mouth at bedtime.  anastrozole (ARIMIDEX) 1 MG tablet Take 1 tablet (1 mg total) by mouth daily. 90 tablet 4   BD PEN NEEDLE NANO U/F 32G X 4 MM MISC USE FOR INSULIN ADMINISTRATION DAILY 100 each 3   Biotin 10 MG CAPS Take by mouth 2 (two) times daily. 5000 mcg bid per pt 30 capsule    carvedilol (COREG) 6.25 MG tablet Take 1 tablet (6.25 mg total) by mouth 2 (two) times daily. 180 tablet 3   cetirizine (ZYRTEC) 10 MG tablet Take 10 mg by mouth every morning.     HUMALOG KWIKPEN 100 UNIT/ML KwikPen Inject 7 Units into the skin 3 (three) times daily. Usually takes 6 units     insulin glargine (LANTUS SOLOSTAR) 100 UNIT/ML Solostar Pen Inject 18 Units into the skin every evening.     ONETOUCH DELICA LANCETS 16X MISC Use lancet to stick finger and check blood sugar two times daily. 100 each 6   ONETOUCH VERIO test strip USE AS DIRECTED. 300 each 2   OVER THE COUNTER MEDICATION Vitamin d 3 50  mcg daily     pravastatin (PRAVACHOL) 40 MG tablet TAKE 1 TABLET BY MOUTH EVERY DAY 90 tablet 3   triamcinolone (NASACORT) 55 MCG/ACT AERO nasal inhaler Place 2 sprays into the nose daily. @ sprays each nostril at night. 1 each 12   No current facility-administered medications on file prior to visit.    Review of Systems:  As per HPI- otherwise negative.   Physical Examination: Vitals:   04/30/21 0854  BP: 130/64  Pulse: 89  Resp: 18  Temp: (!) 97.4 F (36.3 C)  SpO2: 97%   Vitals:   04/30/21 0854  Weight: 229 lb (103.9 kg)  Height: 5\' 8"  (1.727 m)   Body mass index is 34.82 kg/m. Ideal Body Weight: Weight in (lb) to have BMI = 25: 164.1  GEN: no acute distress. Overweight, looks well  HEENT: Atraumatic, Normocephalic.  Ears and Nose: No external deformity. CV: RRR, No M/G/R. No JVD. No thrill. No extra heart sounds. PULM: CTA B, no wheezes, crackles, rhonchi. No retractions. No resp. distress. No accessory muscle use. ABD: S, NT, ND, +BS. No rebound. No HSM. EXTR: No c/c/e PSYCH: Normally interactive. Conversant.  Foot exam- normal today   Assessment and Plan: Mixed hyperlipidemia - Plan: Lipid panel  Stage 3 chronic kidney disease, unspecified whether stage 3a or 3b CKD (HCC)  Controlled type 2 diabetes mellitus with complication, with long-term current use of insulin (HCC)  Fatigue, unspecified type - Plan: TSH, VITAMIN D 25 Hydroxy (Vit-D Deficiency, Fractures) Doing very well, on maint therapy for her breast cancer now  Her diabetes is being treated by endocrinology, she has not seen nephrology as well as urology. She was noted to be vitamin D deficient in May, she is taking over-the-counter supplementation.  We will check her vitamin D today Follow-up on lipids Foot exam done  This visit occurred during the SARS-CoV-2 public health emergency.  Safety protocols were in place, including screening questions prior to the visit, additional usage of staff PPE,  and extensive cleaning of exam room while observing appropriate contact time as indicated for disinfecting solutions.   Signed Lamar Blinks, MD  Received her labs as follows, message to pt  Results for orders placed or performed in visit on 04/30/21  TSH  Result Value Ref Range   TSH 5.75 (H) 0.35 - 5.50 uIU/mL  VITAMIN D 25 Hydroxy (Vit-D Deficiency, Fractures)  Result Value Ref Range  VITD 48.63 30.00 - 100.00 ng/mL  Lipid panel  Result Value Ref Range   Cholesterol 167 0 - 200 mg/dL   Triglycerides 209.0 (H) 0.0 - 149.0 mg/dL   HDL 50.80 >39.00 mg/dL   VLDL 41.8 (H) 0.0 - 40.0 mg/dL   Total CHOL/HDL Ratio 3    NonHDL 116.49   LDL cholesterol, direct  Result Value Ref Range   Direct LDL 86.0 mg/dL

## 2021-04-29 NOTE — Patient Instructions (Addendum)
It was great to see you again today, I will be in touch with your labs as soon as possible.  Please see me in 6 to 12 months assuming all is well  Have a wonderful time on your cruise to [place]!

## 2021-04-30 ENCOUNTER — Ambulatory Visit (INDEPENDENT_AMBULATORY_CARE_PROVIDER_SITE_OTHER): Payer: Medicare Other | Admitting: Family Medicine

## 2021-04-30 ENCOUNTER — Encounter: Payer: Self-pay | Admitting: Family Medicine

## 2021-04-30 ENCOUNTER — Other Ambulatory Visit: Payer: Self-pay

## 2021-04-30 VITALS — BP 130/64 | HR 89 | Temp 97.4°F | Resp 18 | Ht 68.0 in | Wt 229.0 lb

## 2021-04-30 DIAGNOSIS — R5383 Other fatigue: Secondary | ICD-10-CM

## 2021-04-30 DIAGNOSIS — E782 Mixed hyperlipidemia: Secondary | ICD-10-CM

## 2021-04-30 DIAGNOSIS — E559 Vitamin D deficiency, unspecified: Secondary | ICD-10-CM

## 2021-04-30 DIAGNOSIS — Z794 Long term (current) use of insulin: Secondary | ICD-10-CM

## 2021-04-30 DIAGNOSIS — N183 Chronic kidney disease, stage 3 unspecified: Secondary | ICD-10-CM | POA: Diagnosis not present

## 2021-04-30 DIAGNOSIS — E039 Hypothyroidism, unspecified: Secondary | ICD-10-CM | POA: Insufficient documentation

## 2021-04-30 DIAGNOSIS — E118 Type 2 diabetes mellitus with unspecified complications: Secondary | ICD-10-CM

## 2021-04-30 LAB — LDL CHOLESTEROL, DIRECT: Direct LDL: 86 mg/dL

## 2021-04-30 LAB — LIPID PANEL
Cholesterol: 167 mg/dL (ref 0–200)
HDL: 50.8 mg/dL (ref >39.00)
NonHDL: 116.49
Total CHOL/HDL Ratio: 3
Triglycerides: 209 mg/dL — ABNORMAL HIGH (ref 0.0–149.0)
VLDL: 41.8 mg/dL — ABNORMAL HIGH (ref 0.0–40.0)

## 2021-04-30 LAB — TSH: TSH: 5.75 u[IU]/mL — ABNORMAL HIGH (ref 0.35–5.50)

## 2021-04-30 LAB — VITAMIN D 25 HYDROXY (VIT D DEFICIENCY, FRACTURES): VITD: 48.63 ng/mL (ref 30.00–100.00)

## 2021-04-30 MED ORDER — LEVOTHYROXINE SODIUM 25 MCG PO TABS: 25.0000 ug | ORAL_TABLET | Freq: Every day | ORAL | 3 refills | Status: DC

## 2021-05-07 ENCOUNTER — Ambulatory Visit (INDEPENDENT_AMBULATORY_CARE_PROVIDER_SITE_OTHER): Payer: Medicare Other

## 2021-05-07 VITALS — Ht 68.0 in | Wt 229.0 lb

## 2021-05-07 DIAGNOSIS — Z0000 Encounter for general adult medical examination without abnormal findings: Secondary | ICD-10-CM

## 2021-05-07 DIAGNOSIS — Z780 Asymptomatic menopausal state: Secondary | ICD-10-CM

## 2021-05-07 NOTE — Progress Notes (Signed)
Subjective:   Tasha Tucker is a 70 y.o. female who presents for Medicare Annual (Subsequent) preventive examination.  I connected with Shakoya today by telephone and verified that I am speaking with the correct person using two identifiers. Location patient: home Location provider: work Persons participating in the virtual visit: patient, Marine scientist.    I discussed the limitations, risks, security and privacy concerns of performing an evaluation and management service by telephone and the availability of in person appointments. I also discussed with the patient that there may be a patient responsible charge related to this service. The patient expressed understanding and verbally consented to this telephonic visit.    Interactive audio and video telecommunications were attempted between this provider and patient, however failed, due to patient having technical difficulties OR patient did not have access to video capability.  We continued and completed visit with audio only.  Some vital signs may be absent or patient reported.   Time Spent with patient on telephone encounter: 20 minutes   Review of Systems     Cardiac Risk Factors include: advanced age (>56men, >71 women);diabetes mellitus;dyslipidemia;hypertension;obesity (BMI >30kg/m2)     Objective:    Today's Vitals   05/07/21 1300  Weight: 229 lb (103.9 kg)  Height: 5\' 8"  (1.727 m)   Body mass index is 34.82 kg/m.  Advanced Directives 05/07/2021 03/24/2021 12/04/2020 10/28/2020 10/08/2020 08/06/2020 07/19/2020  Does Patient Have a Medical Advance Directive? Yes Yes No No No No No  Type of Paramedic of [place];Living will [place];Living will - - - - -  Copy of [place] in Chart? No - copy requested No - copy requested - - - - -  Would patient like information on creating a medical advance directive? - - - No - Patient declined No - Patient declined No - Patient declined  No - Patient declined    Current Medications (verified) Outpatient Encounter Medications as of 05/07/2021  Medication Sig   acetaminophen (TYLENOL) 500 MG tablet Take 500 mg by mouth at bedtime.    anastrozole (ARIMIDEX) 1 MG tablet Take 1 tablet (1 mg total) by mouth daily.   BD PEN NEEDLE NANO U/F 32G X 4 MM MISC USE FOR INSULIN ADMINISTRATION DAILY   Biotin 10 MG CAPS Take by mouth 2 (two) times daily. 5000 mcg bid per pt   carvedilol (COREG) 6.25 MG tablet Take 1 tablet (6.25 mg total) by mouth 2 (two) times daily.   cetirizine (ZYRTEC) 10 MG tablet Take 10 mg by mouth every morning.   HUMALOG KWIKPEN 100 UNIT/ML KwikPen Inject 7 Units into the skin 3 (three) times daily. Usually takes 6 units   insulin glargine (LANTUS SOLOSTAR) 100 UNIT/ML Solostar Pen Inject 18 Units into the skin every evening.   levothyroxine (SYNTHROID) 25 MCG tablet Take 1 tablet (25 mcg total) by mouth daily.   ONETOUCH DELICA LANCETS 37T MISC Use lancet to stick finger and check blood sugar two times daily.   ONETOUCH VERIO test strip USE AS DIRECTED.   OVER THE COUNTER MEDICATION Vitamin d 3 50 mcg daily   pravastatin (PRAVACHOL) 40 MG tablet TAKE 1 TABLET BY MOUTH EVERY DAY   triamcinolone (NASACORT) 55 MCG/ACT AERO nasal inhaler Place 2 sprays into the nose daily. @ sprays each nostril at night.   No facility-administered encounter medications on file as of 05/07/2021.    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   Hyperlipidemia    Hypertension    Liver hemangioma    yrs ago per pt on 03-19-2021   OA (osteoarthritis)    PONV (postoperative nausea and vomiting)    Type 2 diabetes mellitus ([place])    UTI (urinary tract infection)    finished antibiotics 03-01-2021 all symptoms resolved   Wears glasses 03/19/2021   Past Surgical History:  Procedure Laterality Date   ABDOMINAL HYSTERECTOMY  1990   PARTIAL   BREAST LUMPECTOMY WITH RADIOACTIVE SEED AND SENTINEL LYMPH NODE BIOPSY Right 04/17/2020   Procedure: BREAST LUMPECTOMY X 2  WITH RADIOACTIVE SEED AND SENTINEL LYMPH NODE BIOPSY;  Surgeon: Coralie Keens, MD;  Location: [facility];  Service: General;  Laterality: Right;   CATARACT EXTRACTION W/ INTRAOCULAR LENS  IMPLANT, BILATERAL  2016; 2017   CEREBRAL ANEURYSM REPAIR  1996   clipped-no deficits RIGHT CAROTID   CHOLECYSTECTOMY  2010   laparoscopic   CYSTOSCOPY W/ RETROGRADES Right 06/09/2017   Procedure: CYSTOSCOPY WITH RETROGRADE PYELOGRAM;  Surgeon: Alexis Frock, MD;  Location: [facility];  Service: Urology;  Laterality: Right;   CYSTOSCOPY W/ RETROGRADES Right 07/21/2017   Procedure: CYSTOSCOPY WITH RETROGRADE PYELOGRAM;  Surgeon: Alexis Frock, MD;  Location: [facility];  Service: Urology;  Laterality: Right;   CYSTOSCOPY WITH RETROGRADE PYELOGRAM, URETEROSCOPY AND STENT PLACEMENT Left 08/26/2016   Procedure: CYSTOSCOPY WITH BILATERAL RETROGRADE PYELOGRAM,LEFT URETEROSCOPY WITH BIOPSY AND LEFT STENT PLACEMENT;  Surgeon: Alexis Frock, MD;  Location: [facility];  Service: Urology;  Laterality: Left;   PORT-A-CATH REMOVAL Left 03/24/2021   Procedure: PORT-A-CATH REMOVAL;  Surgeon: Coralie Keens, MD;  Location: [facility];  Service: General;  Laterality: Left;   PORTACATH PLACEMENT Left 06/18/2020   Procedure: INSERTION PORT-A-CATH WITH ULTRASOUND GUIDANCE;   Surgeon: Coralie Keens, MD;  Location: [facility];  Service: General;  Laterality: Left;   ROBOT ASSITED LAPAROSCOPIC NEPHROURETERECTOMY Left 10/09/2016   Procedure: XI ROBOT ASSITED LAPAROSCOPIC NEPHROURETERECTOMY;  Surgeon: Alexis Frock, MD;  Location: [facility];  Service: Urology;  Laterality: Left;   SURGERY ON FOOT FOR STAPH INFECTION  1989   TRANSURETHRAL RESECTION OF BLADDER TUMOR N/A 07/21/2017   Procedure: TRANSURETHRAL RESECTION OF BLADDER TUMOR (TURBT);  Surgeon: Alexis Frock, MD;  Location: [facility];  Service: Urology;  Laterality: N/A;   TRANSURETHRAL RESECTION OF BLADDER TUMOR WITH MITOMYCIN-C N/A 06/09/2017   Procedure: TRANSURETHRAL RESECTION OF BLADDER TUMOR WITH MITOMYCIN-C;  Surgeon: Alexis Frock, MD;  Location: [facility];  Service: Urology;  Laterality: N/A;   TUBAL LIGATION  1980   Family History  Problem Relation Age of Onset   Cancer Mother    Hyperlipidemia Father    Heart attack Father    Diabetes Brother    Breast cancer Neg Hx    Social History   Socioeconomic History   Marital status: Married    Spouse name: Not on file   Number of children: Not  on file   Years of education: Not on file   Highest education level: Not on file  Occupational History   Not on file  Tobacco Use   Smoking status: Never   Smokeless tobacco: Never  Vaping Use   Vaping Use: Never used  Substance and Sexual Activity   Alcohol use: Not Currently    Comment: , STOPPED   Drug use: No   Sexual activity: Yes  Other Topics Concern   Not on file  Social History Narrative   Not on file   Social Determinants of Health   Financial Resource Strain: Low Risk    Difficulty of Paying Living Expenses: Not hard at all  Food Insecurity: No Food Insecurity   Worried About Charity fundraiser in the Last Year: Never true   [place] in the Last Year: Never true  Transportation Needs: No Transportation Needs   Lack of Transportation  (Medical): No   Lack of Transportation (Non-Medical): No  Physical Activity: Inactive   Days of Exercise per Week: 0 days   Minutes of Exercise per Session: 0 min  Stress: No Stress Concern Present   Feeling of Stress : Not at all  Social Connections: Moderately Integrated   Frequency of Communication with Friends and Family: More than three times a week   Frequency of Social Gatherings with Friends and Family: More than three times a week   Attends Religious Services: More than 4 times per year   Active Member of [employer] or Organizations: No   Attends Music therapist: Never   Marital Status: Married    Tobacco Counseling Counseling given: Not Answered   Clinical Intake:  Pre-visit preparation completed: Yes  Pain : No/denies pain     Nutritional Status: BMI > 30  Obese Nutritional Risks: None Diabetes: Yes CBG done?: No Did pt. bring in CBG monitor from home?: No (phone visit)  How often do you need to have someone help you when you read instructions, pamphlets, or other written materials from your doctor or pharmacy?: 1 - Never  Diabetes:  Is the patient diabetic?  Yes  If diabetic, was a CBG obtained today?  No  Did the patient bring in their glucometer from home?  No phone visit How often do you monitor your CBG's? occasionally.   Financial Strains and Diabetes Management:  Are you having any financial strains with the device, your supplies or your medication? No .  Does the patient want to be seen by Chronic Care Management for management of their diabetes?  No  Would the patient like to be referred to a Nutritionist or for Diabetic Management?  No   Diabetic Exams:  Diabetic Eye Exam: Completed 12/18/2020.   Diabetic Foot Exam: Completed 04/30/2021.    Interpreter Needed?: No  Information entered by :: Caroleen Hamman LPN   Activities of Daily Living In your present state of health, do you have any difficulty performing the following  activities: 05/07/2021 03/24/2021  Hearing? N N  Vision? N N  Difficulty concentrating or making decisions? N N  Walking or climbing stairs? N N  Dressing or bathing? N N  Doing errands, shopping? N -  Preparing Food and eating ? N -  Using the Toilet? N -  In the past six months, have you accidently leaked urine? N -  Do you have problems with loss of bowel control? N -  Managing your Medications? N -  Managing your Finances? N -  Housekeeping or managing your Housekeeping? N -  Some recent data might be hidden    Patient Care Team: Copland, Gay Filler, MD as PCP - General (Family Medicine) Alexis Frock, MD as Consulting Physician (Urology) Rockwell Germany, RN as Oncology Nurse Navigator Mauro Kaufmann, RN as Oncology Nurse Navigator Coralie Keens, MD as Consulting Physician (General Surgery) Magrinat, Virgie Dad, MD as Consulting Physician (Oncology) Kyung Rudd, MD as Consulting Physician (Radiation Oncology) Madelin Rear, MD as Consulting Physician (Endocrinology) Madelon Lips, MD as Consulting Physician (Nephrology)  Indicate any recent Medical Services you may have received from other than [facility] providers in the past year (date may be approximate).     Assessment:   This is a routine wellness examination for [place].  Hearing/Vision screen Hearing Screening - Comments:: No issues Vision Screening - Comments:: Last eye exam-11/2020-Dr. Jerline Pain  Dietary issues and exercise activities discussed: Exercise limited by: None identified   Goals Addressed             This Visit's Progress    Increase physical activity   Not on track    Patient Stated       Patient stated-"no time to set goals this past year"       Depression Screen PHQ 2/9 Scores 05/07/2021 04/30/2021 12/02/2020 05/01/2020 11/01/2019 10/12/2018  PHQ - 2 Score 0 0 0 0 0 0    Fall Risk Fall Risk  05/07/2021 04/30/2021 12/02/2020 05/01/2020 11/01/2019  Falls in the past year? 0 0 0 1 0  Number falls in  past yr: 0 - 0 0 -  Injury with Fall? 0 - 0 0 -  Follow up Falls prevention discussed - Falls evaluation completed Education provided;Falls prevention discussed -    FALL RISK PREVENTION PERTAINING TO THE HOME:  Any stairs in or around the home? Yes  If so, are there any without handrails? No  Home free of loose throw rugs in walkways, pet beds, electrical cords, etc? Yes  Adequate lighting in your home to reduce risk of falls? Yes   ASSISTIVE DEVICES UTILIZED TO PREVENT FALLS:  Life alert? No  Use of a cane, walker or w/c? No  Grab bars in the bathroom? No  Shower chair or bench in shower? No  Elevated toilet seat or a handicapped toilet? No   TIMED UP AND GO:  Was the test performed? No . Phone visit   Cognitive Function:Normal cognitive status assessed by  this Nurse Health Advisor. No abnormalities found.          Immunizations Immunization History  Administered Date(s) Administered   Influenza, High Dose Seasonal PF 05/18/2017   Influenza, Quadrivalent, Recombinant, Inj, Pf 06/19/2020   Influenza,inj,Quad PF,6+ Mos 06/18/2018   Influenza-Unspecified 06/13/2019, 06/02/2020   PFIZER(Purple Top)SARS-COV-2 Vaccination 10/18/2019, 11/08/2019, 05/24/2020, 01/14/2021   Pneumococcal Conjugate-13 08/07/2013   Pneumococcal Polysaccharide-23 05/06/2009, 06/06/2013   Tdap 01/24/2016   Zoster Recombinat (Shingrix) 06/13/2019, 02/09/2020   Zoster, Live 05/20/2011    TDAP status: Up to date  Flu Vaccine status: Up to date  Pneumococcal vaccine status: Up to date  Covid-19 vaccine status: Completed vaccines  Qualifies for Shingles Vaccine? No   Zostavax completed Yes   Shingrix Completed?: Yes  Screening Tests Health Maintenance  Topic Date Due   INFLUENZA VACCINE  04/28/2021   Fecal DNA (Cologuard)  07/27/2021   URINE MICROALBUMIN  10/10/2021   HEMOGLOBIN A1C  10/17/2021   OPHTHALMOLOGY EXAM  12/18/2021   FOOT EXAM  04/30/2022   MAMMOGRAM  04/29/2023    TETANUS/TDAP  01/21/2028   DEXA SCAN  Completed   COVID-19 Vaccine  Completed   Hepatitis C Screening  Completed   PNA vac Low Risk Adult  Completed   Zoster Vaccines- Shingrix  Completed   HPV VACCINES  Aged Out    Health Maintenance  Health Maintenance Due  Topic Date Due   INFLUENZA VACCINE  04/28/2021    Colorectal cancer screening: Type of screening: Cologuard. Completed 07/27/2018. Repeat every 3 years  Mammogram status: Completed Bilateral 04/28/2021. Repeat every year  Bone Density status: Ordered today. Pt provided with contact info and advised to call to schedule appt.  Lung Cancer Screening: (Low Dose CT Chest recommended if Age 39-80 years, 30 pack-year currently smoking OR have quit w/in 15years.) does not qualify.     Additional Screening:  Hepatitis C Screening: Completed 03/21/2018  Vision Screening: Recommended annual ophthalmology exams for early detection of glaucoma and other disorders of the eye. Is the patient up to date with their annual eye exam?  Yes  Who is the provider or what is the name of the office in which the patient attends annual eye exams? Dr. Jerline Pain   Dental Screening: Recommended annual dental exams for proper oral hygiene  Community Resource Referral / Chronic Care Management: CRR required this visit?  No   CCM required this visit?  No      Plan:     I have personally reviewed and noted the following in the patient's chart:   Medical and social history Use of alcohol, tobacco or illicit drugs  Current medications and supplements including opioid prescriptions.  Functional ability and status Nutritional status Physical activity Advanced directives List of other physicians Hospitalizations, surgeries, and ER visits in previous 12 months Vitals Screenings to include cognitive, depression, and falls Referrals and appointments  In addition, I have reviewed and discussed with patient certain preventive protocols, quality  metrics, and best practice recommendations. A written personalized care plan for preventive services as well as general preventive health recommendations were provided to patient.   Due to this being a telephonic visit, the after visit summary with patients personalized plan was offered to patient via mail or my-chart. Patient would like to access on my-chart.   Marta Antu, LPN   [id number]  Nurse Health Advisor  Nurse Notes: None

## 2021-05-07 NOTE — Patient Instructions (Signed)
Tasha Tucker , Thank you for taking time to complete your Medicare Wellness Visit. I appreciate your ongoing commitment to your health goals. Please review the following plan we discussed and let me know if I can assist you in the future.   Screening recommendations/referrals: Colonoscopy: Completed Cologuard 07/27/2018-Due 07/27/2021 Mammogram: Completed 04/28/2021-Due 04/28/2022 Bone Density: Ordered today. Someone will call you to schedule. Recommended yearly ophthalmology/optometry visit for glaucoma screening and checkup Recommended yearly dental visit for hygiene and checkup  Vaccinations: Influenza vaccine: Up to date Pneumococcal vaccine: Up to date Tdap vaccine: Up to date-Due-01/21/2028 Shingles vaccine: Completed vaccines   Covid-19:Up to date  Advanced directives: Please bring a copy for your chart  Conditions/risks identified: See problem list  Next appointment: Follow up in one year for your annual wellness visit    Preventive Care 9 Years and Older, Female Preventive care refers to lifestyle choices and visits with your health care provider that can promote health and wellness. What does preventive care include? A yearly physical exam. This is also called an annual well check. Dental exams once or twice a year. Routine eye exams. Ask your health care provider how often you should have your eyes checked. Personal lifestyle choices, including: Daily care of your teeth and gums. Regular physical activity. Eating a healthy diet. Avoiding tobacco and drug use. Limiting alcohol use. Practicing safe sex. Taking low-dose aspirin every day. Taking vitamin and mineral supplements as recommended by your health care provider. What happens during an annual well check? The services and screenings done by your health care provider during your annual well check will depend on your age, overall health, lifestyle risk factors, and family history of disease. Counseling  Your health  care provider may ask you questions about your: Alcohol use. Tobacco use. Drug use. Emotional well-being. Home and relationship well-being. Sexual activity. Eating habits. History of falls. Memory and ability to understand (cognition). Work and work Statistician. Reproductive health. Screening  You may have the following tests or measurements: Height, weight, and BMI. Blood pressure. Lipid and cholesterol levels. These may be checked every 5 years, or more frequently if you are over 41 years old. Skin check. Lung cancer screening. You may have this screening every year starting at age 60 if you have a 30-pack-year history of smoking and currently smoke or have quit within the past 15 years. Fecal occult blood test (FOBT) of the stool. You may have this test every year starting at age 26. Flexible sigmoidoscopy or colonoscopy. You may have a sigmoidoscopy every 5 years or a colonoscopy every 10 years starting at age 77. Hepatitis C blood test. Hepatitis B blood test. Sexually transmitted disease (STD) testing. Diabetes screening. This is done by checking your blood sugar (glucose) after you have not eaten for a while (fasting). You may have this done every 1-3 years. Bone density scan. This is done to screen for osteoporosis. You may have this done starting at age 40. Mammogram. This may be done every 1-2 years. Talk to your health care provider about how often you should have regular mammograms. Talk with your health care provider about your test results, treatment options, and if necessary, the need for more tests. Vaccines  Your health care provider may recommend certain vaccines, such as: Influenza vaccine. This is recommended every year. Tetanus, diphtheria, and acellular pertussis (Tdap, Td) vaccine. You may need a Td booster every 10 years. Zoster vaccine. You may need this after age 68. Pneumococcal 13-valent conjugate (PCV13) vaccine. One dose  is recommended after age  65. Pneumococcal polysaccharide (PPSV23) vaccine. One dose is recommended after age 7. Talk to your health care provider about which screenings and vaccines you need and how often you need them. This information is not intended to replace advice given to you by your health care provider. Make sure you discuss any questions you have with your health care provider. Document Released: 10/11/2015 Document Revised: 06/03/2016 Document Reviewed: 07/16/2015 Elsevier Interactive Patient Education  2017 [place] Prevention in the Home Falls can cause injuries. They can happen to people of all ages. There are many things you can do to make your home safe and to help prevent falls. What can I do on the outside of my home? Regularly fix the edges of walkways and driveways and fix any cracks. Remove anything that might make you trip as you walk through a door, such as a raised step or threshold. Trim any bushes or trees on the path to your home. Use bright outdoor lighting. Clear any walking paths of anything that might make someone trip, such as rocks or tools. Regularly check to see if handrails are loose or broken. Make sure that both sides of any steps have handrails. Any raised decks and porches should have guardrails on the edges. Have any leaves, snow, or ice cleared regularly. Use sand or salt on walking paths during winter. Clean up any spills in your garage right away. This includes oil or grease spills. What can I do in the bathroom? Use night lights. Install grab bars by the toilet and in the tub and shower. Do not use towel bars as grab bars. Use non-skid mats or decals in the tub or shower. If you need to sit down in the shower, use a plastic, non-slip stool. Keep the floor dry. Clean up any water that spills on the floor as soon as it happens. Remove soap buildup in the tub or shower regularly. Attach bath mats securely with double-sided non-slip rug tape. Do not have throw  rugs and other things on the floor that can make you trip. What can I do in the bedroom? Use night lights. Make sure that you have a light by your bed that is easy to reach. Do not use any sheets or blankets that are too big for your bed. They should not hang down onto the floor. Have a firm chair that has side arms. You can use this for support while you get dressed. Do not have throw rugs and other things on the floor that can make you trip. What can I do in the kitchen? Clean up any spills right away. Avoid walking on wet floors. Keep items that you use a lot in easy-to-reach places. If you need to reach something above you, use a strong step stool that has a grab bar. Keep electrical cords out of the way. Do not use floor polish or wax that makes floors slippery. If you must use wax, use non-skid floor wax. Do not have throw rugs and other things on the floor that can make you trip. What can I do with my stairs? Do not leave any items on the stairs. Make sure that there are handrails on both sides of the stairs and use them. Fix handrails that are broken or loose. Make sure that handrails are as long as the stairways. Check any carpeting to make sure that it is firmly attached to the stairs. Fix any carpet that is loose or worn. Avoid  having throw rugs at the top or bottom of the stairs. If you do have throw rugs, attach them to the floor with carpet tape. Make sure that you have a light switch at the top of the stairs and the bottom of the stairs. If you do not have them, ask someone to add them for you. What else can I do to help prevent falls? Wear shoes that: Do not have high heels. Have rubber bottoms. Are comfortable and fit you well. Are closed at the toe. Do not wear sandals. If you use a stepladder: Make sure that it is fully opened. Do not climb a closed stepladder. Make sure that both sides of the stepladder are locked into place. Ask someone to hold it for you, if  possible. Clearly mark and make sure that you can see: Any grab bars or handrails. First and last steps. Where the edge of each step is. Use tools that help you move around (mobility aids) if they are needed. These include: Canes. Walkers. Scooters. Crutches. Turn on the lights when you go into a dark area. Replace any light bulbs as soon as they burn out. Set up your furniture so you have a clear path. Avoid moving your furniture around. If any of your floors are uneven, fix them. If there are any pets around you, be aware of where they are. Review your medicines with your doctor. Some medicines can make you feel dizzy. This can increase your chance of falling. Ask your doctor what other things that you can do to help prevent falls. This information is not intended to replace advice given to you by your health care provider. Make sure you discuss any questions you have with your health care provider. Document Released: 07/11/2009 Document Revised: 02/20/2016 Document Reviewed: 10/19/2014 Elsevier Interactive Patient Education  2017 [employer].

## 2021-05-29 ENCOUNTER — Encounter: Payer: Self-pay | Admitting: Family Medicine

## 2021-05-29 ENCOUNTER — Ambulatory Visit (HOSPITAL_BASED_OUTPATIENT_CLINIC_OR_DEPARTMENT_OTHER)
Admission: RE | Admit: 2021-05-29 | Discharge: 2021-05-29 | Disposition: A | Discharge status: Home / Self Care | Payer: Medicare Other | Source: Ambulatory Visit | Attending: Family Medicine | Admitting: Family Medicine

## 2021-05-29 ENCOUNTER — Other Ambulatory Visit: Payer: Self-pay

## 2021-05-29 DIAGNOSIS — Z780 Asymptomatic menopausal state: Secondary | ICD-10-CM | POA: Diagnosis not present

## 2021-06-12 ENCOUNTER — Telehealth: Payer: Self-pay | Admitting: Family Medicine

## 2021-06-12 ENCOUNTER — Encounter: Payer: Self-pay | Admitting: Family Medicine

## 2021-06-12 DIAGNOSIS — R300 Dysuria: Secondary | ICD-10-CM

## 2021-06-12 MED ORDER — CIPROFLOXACIN HCL 250 MG PO TABS: 250.0000 mg | ORAL_TABLET | Freq: Two times a day (BID) | ORAL | 0 refills | Status: DC

## 2021-06-12 NOTE — Telephone Encounter (Signed)
Patient called stating she might have an UTI and would like to come in to give a sample because she is in pain. She was offered an appointment in another office with another provider since we are booked for today but she declined. She states that she needs to be seen today and will come in whenever to give a urine sample. Please advice.

## 2021-06-12 NOTE — Telephone Encounter (Signed)
Patient has been scheduled for lab appointment for urinalysis tomorrow. Please advise on medication to be sent.

## 2021-06-12 NOTE — Telephone Encounter (Signed)
Please advise if ok to place urinalysis orders. Will schedule an appointment for lab if appropriate.

## 2021-06-13 ENCOUNTER — Other Ambulatory Visit (INDEPENDENT_AMBULATORY_CARE_PROVIDER_SITE_OTHER): Payer: Medicare Other

## 2021-06-13 ENCOUNTER — Other Ambulatory Visit: Payer: Self-pay

## 2021-06-13 DIAGNOSIS — E039 Hypothyroidism, unspecified: Secondary | ICD-10-CM

## 2021-06-13 DIAGNOSIS — R300 Dysuria: Secondary | ICD-10-CM | POA: Diagnosis not present

## 2021-06-13 LAB — POCT URINALYSIS DIP (MANUAL ENTRY)
Bilirubin, UA: NEGATIVE
Glucose, UA: 100 mg/dL — AB
Ketones, POC UA: NEGATIVE mg/dL
Nitrite, UA: NEGATIVE
Protein Ur, POC: NEGATIVE mg/dL
Spec Grav, UA: 1.015 (ref 1.010–1.025)
Urobilinogen, UA: 0.2 E.U./dL
pH, UA: 6 (ref 5.0–8.0)

## 2021-06-13 LAB — TSH: TSH: 6.28 u[IU]/mL — ABNORMAL HIGH (ref 0.35–5.50)

## 2021-06-13 NOTE — Addendum Note (Signed)
Addended by: Kelle Darting A on: 06/13/2021 10:02 AM   Modules accepted: Orders

## 2021-06-14 LAB — URINE CULTURE
MICRO NUMBER:: 12384677
Result:: NO GROWTH
SPECIMEN QUALITY:: ADEQUATE

## 2021-06-15 ENCOUNTER — Encounter: Payer: Self-pay | Admitting: Family Medicine

## 2021-06-15 DIAGNOSIS — E039 Hypothyroidism, unspecified: Secondary | ICD-10-CM

## 2021-06-15 NOTE — Progress Notes (Signed)
[place]  [phone] [phone]     ID: Tasha Tucker DOB: 11/06/1950  MR#: [id number]  GXQ#:[id number]  Patient Care Team: Darreld Mclean, MD as PCP - General (Family Medicine) Alexis Frock, MD as Consulting Physician (Urology) Rockwell Germany, RN as Oncology Nurse Navigator Mauro Kaufmann, RN as Oncology Nurse Navigator Coralie Keens, MD as Consulting Physician (General Surgery) Keaira Whitehurst, Virgie Dad, MD as Consulting Physician (Oncology) Kyung Rudd, MD as Consulting Physician (Radiation Oncology) Madelin Rear, MD as Consulting Physician (Endocrinology) Madelon Lips, MD as Consulting Physician (Nephrology) Chauncey Cruel, MD OTHER MD:  CHIEF COMPLAINT: Estrogen receptor positive breast cancer  CURRENT TREATMENT: anastrozole   INTERVAL HISTORY: Tasha Tucker returns today for follow up of her estrogen receptor positive breast cancer.   She continues on anastrozole, which was started in 03/2020 in anticipation of surgical delays.  She has mild hot flashes usually midmorning.  Overall she is tolerating the medication remarkably well.  Since her last visit, she underwent bilateral diagnostic mammography with tomography at [facility] on 04/28/2021 showing: breast density category B; no evidence of malignancy in either breast.   She also underwent bone density screening on 05/29/2021 showing a T-score of +1.7, which is normal.  Of note, she is followed by Dr. Tresa Moore for her history of urothelial carcinoma, s/p resection in 09/2016. Her most recent surveillance cystoscopy from 04/2021 showed no evidence of recurrence.   REVIEW OF SYSTEMS: Tasha Tucker tells me she has been found to be hypothyroid.  She is now taking Synthroid.  She also had bladder spasms last week.  Cultures apparently were negative but nevertheless she responded to Cipro or at least she got well at the same time as she took Cipro.  She wonders how much of any of that is  related to her prior treatment with chemo.  She says that her hair is sufficiently back that she is finally going to go to her hairstylist next week.  She is planning a cruise to [place] and [place] and then [place] in the next month.  A detailed review of systems was otherwise stable.   COVID 19 VACCINATION STATUS: fully vaccinated [employer]), with booster 04/2020; infection 02/2021   HISTORY OF CURRENT ILLNESS: From the original intake note:  Tasha Tucker had routine screening mammography on 03/13/2020 showing a possible abnormality in the right breast. She underwent right diagnostic mammography with tomography and right breast ultrasonography at [facility] on 03/26/2020 showing: breast density category B; suspicious 1.9 cm mass/distortion in right breast at 9 o'clock; no right axillary lymphadenopathy.  Accordingly that same day, she proceeded to biopsy of the right breast area in question. The pathology from this procedure ([id number]) showed: invasive and in situ mammary carcinoma, grade 2, e-cadherin negative. Prognostic indicators significant for: estrogen receptor, 90% positive and progesterone receptor, 90% positive, both with strong staining intensity. Proliferation marker Ki67 at 5%. HER2 negative by immunohistochemistry (1+).  The patient's subsequent history is as detailed below.   PAST MEDICAL HISTORY: Past Medical History:  Diagnosis Date   Bladder cancer ([place])    UROLOGIST- DR [facility]   Breast cancer ([place]) 2021   right lumpectomy and chemo and radiation done   DJD (degenerative joint disease)    NECK, NEEDS PILLOW WHEN LYING FLAT IS POSSIBLE   History of cerebral aneurysm repair    1996-- S/P CLAPPING RIGHT CAROTID --- NO DEFICITS   History of renal pelvis cancer    10-09-2016  S/P  LEFT RADICAL NEPHROURETERECTOMY -- HIGH GRADE PAPILLARY UROTHELIAL CARCINOMA   Hyperlipidemia    Hypertension    Liver hemangioma    yrs ago per pt on 03-19-2021   OA  (osteoarthritis)    PONV (postoperative nausea and vomiting)    Type 2 diabetes mellitus ([place])    UTI (urinary tract infection)    finished antibiotics 03-01-2021 all symptoms resolved   Wears glasses 03/19/2021    PAST SURGICAL HISTORY: Past Surgical History:  Procedure Laterality Date   ABDOMINAL HYSTERECTOMY  1990   PARTIAL   BREAST LUMPECTOMY WITH RADIOACTIVE SEED AND SENTINEL LYMPH NODE BIOPSY Right 04/17/2020   Procedure: BREAST LUMPECTOMY X 2  WITH RADIOACTIVE SEED AND SENTINEL LYMPH NODE BIOPSY;  Surgeon: Coralie Keens, MD;  Location: [facility];  Service: General;  Laterality: Right;   CATARACT EXTRACTION W/ INTRAOCULAR LENS  IMPLANT, BILATERAL  2016; 2017   [place]   clipped-no deficits RIGHT CAROTID   CHOLECYSTECTOMY  2010   laparoscopic   CYSTOSCOPY W/ RETROGRADES Right 06/09/2017   Procedure: CYSTOSCOPY WITH RETROGRADE PYELOGRAM;  Surgeon: Alexis Frock, MD;  Location: [facility];  Service: Urology;  Laterality: Right;   CYSTOSCOPY W/ RETROGRADES Right 07/21/2017   Procedure: CYSTOSCOPY WITH RETROGRADE PYELOGRAM;  Surgeon: Alexis Frock, MD;  Location: [facility];  Service: Urology;  Laterality: Right;   CYSTOSCOPY WITH RETROGRADE PYELOGRAM, URETEROSCOPY AND STENT PLACEMENT Left 08/26/2016   Procedure: CYSTOSCOPY WITH BILATERAL RETROGRADE PYELOGRAM,LEFT URETEROSCOPY WITH BIOPSY AND LEFT STENT PLACEMENT;  Surgeon: Alexis Frock, MD;  [facility];  Service: Urology;  Laterality: Left;   PORT-A-CATH REMOVAL Left 03/24/2021   Procedure: PORT-A-CATH REMOVAL;  Surgeon: Coralie Keens, MD;  Location: [facility];  Service: General;  Laterality: Left;   PORTACATH PLACEMENT Left 06/18/2020   Procedure: INSERTION PORT-A-CATH WITH ULTRASOUND GUIDANCE;  Surgeon: Coralie Keens, MD;  Location: [facility];  Service: General;  Laterality: Left;   ROBOT ASSITED LAPAROSCOPIC NEPHROURETERECTOMY  Left 10/09/2016   Procedure: XI ROBOT ASSITED LAPAROSCOPIC NEPHROURETERECTOMY;  Surgeon: Alexis Frock, MD;  Location: [facility];  Service: Urology;  Laterality: Left;   SURGERY ON FOOT FOR STAPH INFECTION  1989   TRANSURETHRAL RESECTION OF BLADDER TUMOR N/A 07/21/2017   Procedure: TRANSURETHRAL RESECTION OF BLADDER TUMOR (TURBT);  Surgeon: Alexis Frock, MD;  Location: [facility];  Service: Urology;  Laterality: N/A;   TRANSURETHRAL RESECTION OF BLADDER TUMOR WITH MITOMYCIN-C N/A 06/09/2017   Procedure: TRANSURETHRAL RESECTION OF BLADDER TUMOR WITH MITOMYCIN-C;  Surgeon: Alexis Frock, MD;  Location: [facility];  Service: Urology;  Laterality: N/A;   TUBAL LIGATION  1980    FAMILY HISTORY: Family History  Problem Relation Age of Onset   Cancer Mother    Hyperlipidemia Father    Heart attack Father    Diabetes Brother    Breast cancer Neg Hx    Her father died at age 16 from ASCVD. Her mother died at age 81. She reports her mother had a tumor in her thigh at age 30, possibly a sarcoma. The patient has one brother, with no history of cancer. Her son, Tasha Tucker, had brain cancer (oligodedroglioma, possibly) and is doing well now at age 62 (as of 03/2020).   GYNECOLOGIC HISTORY:  No LMP recorded. Patient has had a hysterectomy. Menarche: 70 years old Age at first live birth: 70 years old [place] P 3 LMP 1990 Contraceptive: used pills for 8 years without issues HRT never used  Hysterectomy? Yes, 1990 BSO? 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 Husband Tasha Tucker is a retired Garment/textile technologist for the [employer]. She lives at home with husband Tasha Tucker and their cat, Tasha Tucker. Daughter Tasha Tucker, age 7, works as a Careers adviser in [place]. Son Tasha Tucker, age 72, works in Human resources officer in [place]. Son Tasha Tucker, age 79, lives in [place] and was previously a paramedic. Oval has 4 grandchildren. She  attends a [employer].     ADVANCED DIRECTIVES: In the absence of any documentation to the contrary, the patient's spouse is their HCPOA.    HEALTH MAINTENANCE: Social History   Tobacco Use   Smoking status: Never   Smokeless tobacco: Never  Vaping Use   Vaping Use: Never used  Substance Use Topics   Alcohol use: Not Currently    Comment: , STOPPED   Drug use: No     Colonoscopy: Cologuard 2020 was negative  PAP: date unknown, s/p hysterectomy  Bone density: 05/2021, +1.7   Allergies  Allergen Reactions   Other Other (See Comments)   Adhesive [Tape] Rash    PAPER TAPE ONLY to use   Chlorhexidine Gluconate Itching and Rash    Topical antiseptic (CHG WIPES)    Dilantin [Phenytoin] Rash   Penicillins Rash    Has patient had a PCN reaction causing immediate rash, facial/tongue/throat swelling, SOB or lightheadedness with hypotension:unsure Has patient had a PCN reaction causing severe rash involving mucus membranes or skin necrosis:unsure Has patient had a PCN reaction that required hospitalization:No Has patient had a PCN reaction occurring within the last 10 years:No If all of the above answers are "NO", then may proceed with Cephalosporin use.    Sulfa Antibiotics Rash    Current Outpatient Medications  Medication Sig Dispense Refill   acetaminophen (TYLENOL) 500 MG tablet Take 500 mg by mouth at bedtime.      anastrozole (ARIMIDEX) 1 MG tablet Take 1 tablet (1 mg total) by mouth daily. 90 tablet 4   BD PEN NEEDLE NANO U/F 32G X 4 MM MISC USE FOR INSULIN ADMINISTRATION DAILY 100 each 3   Biotin 10 MG CAPS Take by mouth 2 (two) times daily. 5000 mcg bid per pt 30 capsule    carvedilol (COREG) 6.25 MG tablet Take 1 tablet (6.25 mg total) by mouth 2 (two) times daily. 180 tablet 3   cetirizine (ZYRTEC) 10 MG tablet Take 10 mg by mouth every morning.     HUMALOG KWIKPEN 100 UNIT/ML KwikPen Inject 7 Units into the skin 3 (three) times daily. Usually takes 6 units      insulin glargine (LANTUS SOLOSTAR) 100 UNIT/ML Solostar Pen Inject 18 Units into the skin every evening.     levothyroxine (SYNTHROID) 25 MCG tablet Take 1 tablet (25 mcg total) by mouth daily. 30 tablet 3   ONETOUCH DELICA LANCETS 34L MISC Use lancet to stick finger and check blood sugar two times daily. 100 each 6   ONETOUCH VERIO test strip USE AS DIRECTED. 300 each 2   OVER THE COUNTER MEDICATION Vitamin d 3 50 mcg daily     pravastatin (PRAVACHOL) 40 MG tablet TAKE 1 TABLET BY MOUTH EVERY DAY 90 tablet 3   triamcinolone (NASACORT) 55 MCG/ACT AERO nasal inhaler Place 2 sprays into the nose daily. @ sprays each nostril at night. 1 each 12   No current facility-administered medications for this visit.    OBJECTIVE: White woman in no  acute distress  Vitals:   06/16/21 1047  BP: (!) 146/66  Pulse: 70  Resp: 14  Temp: (!) 97.2 F (36.2 C)  SpO2: 97%     Body mass index is 34.73 kg/m.   Wt Readings from Last 3 Encounters:  06/16/21 228 lb 6.4 oz (103.6 kg)  05/07/21 229 lb (103.9 kg)  04/30/21 229 lb (103.9 kg)      ECOG FS:1 - Symptomatic but completely ambulatory  Sclerae unicteric, EOMs intact Wearing a mask No cervical or supraclavicular adenopathy Lungs no rales or rhonchi Heart regular rate and rhythm Abd soft, nontender, positive bowel sounds MSK no focal spinal tenderness, no upper extremity lymphedema Neuro: nonfocal, well oriented, appropriate affect Breasts: The right breast is status postlumpectomy and radiation.  The cosmetic result is excellent.  There is no evidence of local recurrence per the left breast and both axillae are benign   LAB RESULTS:  CMP     Component Value Date/Time   NA 141 03/24/2021 1107   NA 140 04/21/2019 0000   K 4.8 03/24/2021 1107   CL 105 03/24/2021 1107   CO2 24 02/17/2021 0918   GLUCOSE 204 (H) 03/24/2021 1107   BUN 32 (H) 03/24/2021 1107   BUN 36 (A) 08/30/2017 0000   CREATININE 1.50 (H) 03/24/2021 1107   CREATININE  1.78 (H) 06/19/2020 0842   CALCIUM 9.5 02/17/2021 0918   PROT 7.1 02/17/2021 0918   ALBUMIN 3.5 02/17/2021 0918   AST 23 02/17/2021 0918   AST 24 06/19/2020 0842   ALT 25 02/17/2021 0918   ALT 30 06/19/2020 0842   ALKPHOS 61 02/17/2021 0918   BILITOT 0.5 02/17/2021 0918   BILITOT 0.5 06/19/2020 0842   GFRNONAA 29 (L) 02/17/2021 0918   GFRNONAA 28 (L) 06/19/2020 0842   GFRAA 34 (L) 06/26/2020 1535   GFRAA 33 (L) 06/19/2020 0842    No results found for: TOTALPROTELP, ALBUMINELP, A1GS, A2GS, BETS, BETA2SER, GAMS, MSPIKE, SPEI  Lab Results  Component Value Date   WBC 5.5 06/16/2021   NEUTROABS 3.9 06/16/2021   HGB 14.2 06/16/2021   HCT 40.7 06/16/2021   MCV 89.1 06/16/2021   PLT 132 (L) 06/16/2021    No results found for: LABCA2  No components found for: DHRCBU384  No results for input(s): INR in the last 168 hours.  No results found for: LABCA2  No results found for: TXM468  No results found for: EHO122  No results found for: QMG500  No results found for: CA2729  No components found for: HGQUANT  No results found for: CEA1 / No results found for: CEA1   No results found for: AFPTUMOR  No results found for: CHROMOGRNA  No results found for: KPAFRELGTCHN, LAMBDASER, KAPLAMBRATIO (kappa/lambda light chains)  No results found for: HGBA, HGBA2QUANT, HGBFQUANT, HGBSQUAN (Hemoglobinopathy evaluation)   No results found for: LDH  No results found for: IRON, TIBC, IRONPCTSAT (Iron and TIBC)  No results found for: FERRITIN  Urinalysis    Component Value Date/Time   COLORURINE YELLOW 08/06/2020 1414   APPEARANCEUR HAZY (A) 08/06/2020 1414   LABSPEC 1.009 08/06/2020 1414   PHURINE 6.0 08/06/2020 1414   GLUCOSEU NEGATIVE 08/06/2020 1414   HGBUR SMALL (A) 08/06/2020 1414   BILIRUBINUR negative 06/13/2021 0959   BILIRUBINUR negative 02/25/2021 1511   KETONESUR negative 06/13/2021 0959   KETONESUR NEGATIVE 08/06/2020 1414   PROTEINUR negative 06/13/2021  0959   PROTEINUR Negative 02/25/2021 1511   PROTEINUR NEGATIVE 08/06/2020 1414   UROBILINOGEN 0.2  06/13/2021 0959   UROBILINOGEN 1.0 04/29/2013 1455   NITRITE Negative 06/13/2021 0959   NITRITE negative 02/25/2021 1511   NITRITE NEGATIVE 08/06/2020 1414   LEUKOCYTESUR Small (1+) (A) 06/13/2021 0959   LEUKOCYTESUR LARGE (A) 08/06/2020 1414    STUDIES: DEXAScan  Result Date: 05/29/2021 EXAM: DUAL X-RAY ABSORPTIOMETRY (DXA) FOR BONE MINERAL DENSITY IMPRESSION: [place] Your patient Tasha Tucker completed a BMD test on 05/29/2021 using the [employer]psy (analysis version: 16.SP2) manufactured by [employer]. The following summarizes the results of our evaluation. [place] PATIENT: Name: Tasha, Tucker Patient ID: [id number] Birth Date: 1951-08-04 Height: 68.0 in. Gender: Female Measured: 05/29/2021 Weight: 229.0 lbs. Indications: Advanced Age, Caucasian, Chemotherapy for Cancer, Diabetic, Estrogen Deficiency, Hysterectomy, Low Calcium Intake, Post Menopausal, Vitamin D Deficiency Fractures: Treatments: Arimidex, Chemo, Insulin, Levothyroxine, Vitamin D ASSESSMENT: The BMD measured at AP Spine L1-L4 is 1.386 g/cm2 with a T-score of 1.7. This patient is considered normal according to [facility]) criteria. Compared with the prior study on, 03/30/2018 the BMD of the total mean shows no statistically signficant change. The scan quality is good. Site Region Measured Date Measured Age WHO YA BMD Classification T-score AP Spine L1-L4 05/29/2019 72.1 Normal 1.7 1.386 g/cm2 AP Spine L1-L4 03/30/2018 68.9 Normal 1.5 1.366 g/cm2 DualFemur Total Mean 05/29/2021 72.1 Normal 2.9 1.376 g/cm2 DualFemur Total Mean 03/30/2018 68.9 Normal 3.0 1.383 g/cm2 World Health Organization [facility]) criteria for post-menopausal, Caucasian Women: Normal       T-score at or above -1 SD Osteopenia   T-score between -1 and -2.5 SD Osteoporosis T-score at or below -2.5 SD RECOMMENDATION: 1. All patients  should optimize calcium and vitamin D intake. 2. Consider FDA-approved medical therapies in postmenopausal women and men aged 62 years and older, based on the following: a. A hip or vertebral(clinical or morphometric) fracture. b. T-Score < -2.5 at the femoral neck or spine after appropriate evaluation to exclude secondary causes c. Low bone mass (T-score between -1.0 and -2.5 at the femoral neck or spine) and a 10 year probability of a hip fracture >3% or a 10 year probability of major osteoporosis-related fracture > 20% based on the [place]-adapted WHO algorithm d. Clinical judgement and/or patient preferences may indicate treatment for people with 10-year fracture probabilities above or below these levels FOLLOW-UP: Patients with diagnosis of osteoporosis or at high risk for fracture should have regular bone mineral density tests. For patients eligible for Medicare, routine testing is allowed once every 2 years. The testing frequency can be increased to one year for patients who have rapidly progressing disease, those who are receiving or discontinuing medical therapy to restore bone mass, or have additional risk factors. I have reviewed this report, and agree with the above findings. [facility] Electronically Signed   By: Rolm Baptise M.D.   On: 05/29/2021 14:53     ELIGIBLE FOR AVAILABLE RESEARCH PROTOCOL: AET  ASSESSMENT: 70 y.o. [place] woman status post right breast upper outer quadrant biopsy 03/26/2020, for a clinical T1c N0, stage IA invasive lobular carcinoma, E-cadherin negative, grade 2, estrogen and progesterone receptor positive, with an MIB-1 of 5% and no HER-2 amplification  (a) unable to obtain breast MRI due to renal concern with contrast status post nephrectomy  (1) status post right lumpectomy and sentinel lymph node sampling 04/17/2020 for a pT2 pN0, stage IB invasive lobular carcinoma, grade 2, with negative margins  (a) a single right axillary lymph node was  removed.  (2)  Oncotype score of 29 predicts a risk of recurrence outside the breast within the next 9 years of 18% with antiestrogens only, and also a greater than 15% benefit from chemotherapy.  (3) chemotherapy consisting of cyclophosphamide, methotrexate and fluorouracil given every 21 days x 8, started 06/19/2020, completed 11/18/2020  (a) Udenyca added a day 3 beginning with cycle 2 secondary to treatment delays due to neutropenia  (4) adjuvant radiation 12/11/2020 through 01/08/2021 Site Technique Total Dose (Gy) Dose per Fx (Gy) Completed Fx Beam Energies  Breast, Right: Breast_Rt 3D 42.56/42.56 2.66 16/16 6X, 10X  Breast, Right: Breast_Rt_Bst specialPort 8/8 2 4/4 15E   (5) anastrozole started 04/03/2020 with anticipation of surgical delays  (a) bone density 03/30/2018 shows a T score of +1.5 (normal)  (B) bone density 05/29/2021 shows a T-score of +1.7   PLAN: Tasha Tucker will soon be a year out from definitive surgery for her breast cancer, with no evidence of disease recurrence.  This is favorable.    She is tolerating anastrozole well and the plan will be to continue that a total of 5 years.  Her bone densities in particular are about the best I have seen in a long time.  I do not think the thyroid problem is related to her prior treatment.  She understands that her primary care doctor will find the correct Synthroid dosing she will be on it for the rest of her life.  I think she has been having bladder spasms probably related to the prior treatment there.  I am not sure she is actually having multiple urinary tract infections although patients do have those sometimes associated with vaginal atrophy.  I suggested she use coconut oil or other emollients to keep that area moist.  Otherwise she will return to see [place] in a year after her next mammogram in August 2023.  Total encounter time 25 minutes.Sarajane Jews C. Frisco Cordts, MD 06/16/2021 11:04 AM Medical Oncology and Hematology [facility] [place], [place] [address] Tel. [phone]    Fax. [phone]   This document serves as a record of services personally performed by Lurline Del, MD. It was created on his behalf by Wilburn Mylar, a trained medical scribe. The creation of this record is based on the scribe's personal observations and the provider's statements to them.   I, Lurline Del MD, have reviewed the above documentation for accuracy and completeness, and I agree with the above.   *Total Encounter Time as defined by the Centers for Medicare and Medicaid Services includes, in addition to the face-to-face time of a patient visit (documented in the note above) non-face-to-face time: obtaining and reviewing outside history, ordering and reviewing medications, tests or procedures, care coordination (communications with other health care professionals or caregivers) and documentation in the medical record.

## 2021-06-16 ENCOUNTER — Inpatient Hospital Stay (HOSPITAL_BASED_OUTPATIENT_CLINIC_OR_DEPARTMENT_OTHER): Payer: Medicare Other | Admitting: Oncology

## 2021-06-16 ENCOUNTER — Other Ambulatory Visit: Payer: Self-pay

## 2021-06-16 ENCOUNTER — Inpatient Hospital Stay: Payer: Medicare Other | Attending: Oncology

## 2021-06-16 VITALS — BP 146/66 | HR 70 | Temp 97.2°F | Resp 14 | Ht 68.0 in | Wt 228.4 lb

## 2021-06-16 DIAGNOSIS — E039 Hypothyroidism, unspecified: Secondary | ICD-10-CM | POA: Insufficient documentation

## 2021-06-16 DIAGNOSIS — Z905 Acquired absence of kidney: Secondary | ICD-10-CM | POA: Insufficient documentation

## 2021-06-16 DIAGNOSIS — Z170 Estrogen receptor positive status [ER+]: Secondary | ICD-10-CM | POA: Diagnosis not present

## 2021-06-16 DIAGNOSIS — N3289 Other specified disorders of bladder: Secondary | ICD-10-CM | POA: Diagnosis not present

## 2021-06-16 DIAGNOSIS — Z79811 Long term (current) use of aromatase inhibitors: Secondary | ICD-10-CM | POA: Diagnosis not present

## 2021-06-16 DIAGNOSIS — C50411 Malignant neoplasm of upper-outer quadrant of right female breast: Secondary | ICD-10-CM | POA: Diagnosis not present

## 2021-06-16 DIAGNOSIS — Z9221 Personal history of antineoplastic chemotherapy: Secondary | ICD-10-CM | POA: Insufficient documentation

## 2021-06-16 DIAGNOSIS — D696 Thrombocytopenia, unspecified: Secondary | ICD-10-CM

## 2021-06-16 LAB — COMPREHENSIVE METABOLIC PANEL
ALT: 31 U/L (ref 0–44)
AST: 26 U/L (ref 15–41)
Albumin: 3.6 g/dL (ref 3.5–5.0)
Alkaline Phosphatase: 61 U/L (ref 38–126)
Anion gap: 10 (ref 5–15)
BUN: 25 mg/dL — ABNORMAL HIGH (ref 8–23)
CO2: 23 mmol/L (ref 22–32)
Calcium: 9.9 mg/dL (ref 8.9–10.3)
Chloride: 108 mmol/L (ref 98–111)
Creatinine, Ser: 1.88 mg/dL — ABNORMAL HIGH (ref 0.44–1.00)
GFR, Estimated: 28 mL/min — ABNORMAL LOW (ref >60)
Glucose, Bld: 202 mg/dL — ABNORMAL HIGH (ref 70–99)
Potassium: 4.5 mmol/L (ref 3.5–5.1)
Sodium: 141 mmol/L (ref 135–145)
Total Bilirubin: 0.5 mg/dL (ref 0.3–1.2)
Total Protein: 7 g/dL (ref 6.5–8.1)

## 2021-06-16 LAB — CBC WITH DIFFERENTIAL/PLATELET
Abs Immature Granulocytes: 0.02 10*3/uL (ref 0.00–0.07)
Basophils Absolute: 0.1 10*3/uL (ref 0.0–0.1)
Basophils Relative: 1 %
Eosinophils Absolute: 0.1 10*3/uL (ref 0.0–0.5)
Eosinophils Relative: 2 %
HCT: 40.7 % (ref 36.0–46.0)
Hemoglobin: 14.2 g/dL (ref 12.0–15.0)
Immature Granulocytes: 0 %
Lymphocytes Relative: 19 %
Lymphs Abs: 1 10*3/uL (ref 0.7–4.0)
MCH: 31.1 pg (ref 26.0–34.0)
MCHC: 34.9 g/dL (ref 30.0–36.0)
MCV: 89.1 fL (ref 80.0–100.0)
Monocytes Absolute: 0.4 10*3/uL (ref 0.1–1.0)
Monocytes Relative: 8 %
Neutro Abs: 3.9 10*3/uL (ref 1.7–7.7)
Neutrophils Relative %: 70 %
Platelets: 132 10*3/uL — ABNORMAL LOW (ref 150–400)
RBC: 4.57 MIL/uL (ref 3.87–5.11)
RDW: 13.2 % (ref 11.5–15.5)
WBC: 5.5 10*3/uL (ref 4.0–10.5)
nRBC: 0 % (ref 0.0–0.2)

## 2021-06-16 LAB — SAVE SMEAR(SSMR), FOR PROVIDER SLIDE REVIEW

## 2021-06-16 MED ORDER — LEVOTHYROXINE SODIUM 50 MCG PO TABS: 50.0000 ug | ORAL_TABLET | Freq: Every day | ORAL | 3 refills | Status: DC

## 2021-06-16 MED ORDER — ANASTROZOLE 1 MG PO TABS
1.0000 mg | ORAL_TABLET | Freq: Every day | ORAL | 4 refills | Status: DC
Start: 2021-06-16 — End: 2022-09-09

## 2021-06-17 LAB — PLATELET BY CITRATE

## 2021-06-18 NOTE — Telephone Encounter (Signed)
-----   Message from Darreld Mclean, MD sent at 06/16/2021  8:39 PM EDT ----- Contact [facility] regarding her recent hematuria with negative urine culture

## 2021-06-18 NOTE — Telephone Encounter (Signed)
[facility] and LM with nurse to give to Dr Tresa Moore- blood on UA with negative culture.  They will let him know - in case any further action is needed

## 2021-07-19 ENCOUNTER — Other Ambulatory Visit: Payer: Self-pay | Admitting: Family Medicine

## 2021-07-19 DIAGNOSIS — I159 Secondary hypertension, unspecified: Secondary | ICD-10-CM

## 2021-07-21 ENCOUNTER — Other Ambulatory Visit: Payer: Self-pay

## 2021-07-21 ENCOUNTER — Ambulatory Visit: Payer: Medicare Other | Attending: Oncology

## 2021-07-21 VITALS — Wt 233.0 lb

## 2021-07-21 DIAGNOSIS — Z483 Aftercare following surgery for neoplasm: Secondary | ICD-10-CM | POA: Insufficient documentation

## 2021-07-21 NOTE — Therapy (Signed)
[facility] @ [place], [place], [address] Phone: [phone]   Fax:  [phone]  Physical Therapy Treatment  Patient Details  Name: Tasha Tucker MRN: [id number] Date of Birth: [birth year] No data recorded  Encounter Date: 07/21/2021   PT End of Session - 07/21/21 0920     Visit Number 2   # unchanged due to screen only   PT Start Time 0918    PT Stop Time 0923    PT Time Calculation (min) 5 min    Activity Tolerance Patient tolerated treatment well    Behavior During Therapy [facility] for tasks assessed/performed             Past Medical History:  Diagnosis Date   Bladder cancer ([place])    UROLOGIST- DR [facility]   Breast cancer ([place]) 2021   right lumpectomy and chemo and radiation done   DJD (degenerative joint disease)    NECK, NEEDS PILLOW WHEN LYING FLAT IS POSSIBLE   History of cerebral aneurysm repair    1996-- S/P CLAPPING RIGHT CAROTID --- NO DEFICITS   History of renal pelvis cancer    10-09-2016  S/P  LEFT RADICAL NEPHROURETERECTOMY -- HIGH GRADE PAPILLARY UROTHELIAL CARCINOMA   Hyperlipidemia    Hypertension    Liver hemangioma    yrs ago per pt on 03-19-2021   OA (osteoarthritis)    PONV (postoperative nausea and vomiting)    Type 2 diabetes mellitus ([place])    UTI (urinary tract infection)    finished antibiotics 03-01-2021 all symptoms resolved   Wears glasses 03/19/2021    Past Surgical History:  Procedure Laterality Date   ABDOMINAL HYSTERECTOMY  1990   PARTIAL   BREAST LUMPECTOMY WITH RADIOACTIVE SEED AND SENTINEL LYMPH NODE BIOPSY Right 04/17/2020   Procedure: BREAST LUMPECTOMY X 2  WITH RADIOACTIVE SEED AND SENTINEL LYMPH NODE BIOPSY;  Surgeon: Coralie Keens, MD;  Location: [place];  Service: General;  Laterality: Right;   CATARACT EXTRACTION W/ INTRAOCULAR LENS  IMPLANT, BILATERAL  2016; 2017   CEREBRAL ANEURYSM [place]   clipped-no deficits RIGHT CAROTID    CHOLECYSTECTOMY  2010   laparoscopic   CYSTOSCOPY W/ RETROGRADES Right 06/09/2017   Procedure: CYSTOSCOPY WITH RETROGRADE PYELOGRAM;  Surgeon: Alexis Frock, MD;  Location: [place];  Service: Urology;  Laterality: Right;   CYSTOSCOPY W/ RETROGRADES Right 07/21/2017   Procedure: CYSTOSCOPY WITH RETROGRADE PYELOGRAM;  Surgeon: Alexis Frock, MD;  Location: [facility];  Service: Urology;  Laterality: Right;   CYSTOSCOPY WITH RETROGRADE PYELOGRAM, URETEROSCOPY AND STENT PLACEMENT Left 08/26/2016   Procedure: CYSTOSCOPY WITH BILATERAL RETROGRADE PYELOGRAM,LEFT URETEROSCOPY WITH BIOPSY AND LEFT STENT PLACEMENT;  Surgeon: Alexis Frock, MD;  Location[facility];  Service: Urology;  Laterality: Left;   PORT-A-CATH REMOVAL Left 03/24/2021   Procedure: PORT-A-CATH REMOVAL;  Surgeon: Coralie Keens, MD;  Location: [facility];  Service: General;  Laterality: Left;   PORTACATH PLACEMENT Left 06/18/2020   Procedure: INSERTION PORT-A-CATH WITH ULTRASOUND GUIDANCE;  Surgeon: Coralie Keens, MD;  Location: [place];  Service: General;  Laterality: Left;   ROBOT ASSITED LAPAROSCOPIC NEPHROURETERECTOMY Left 10/09/2016   Procedure: XI ROBOT ASSITED LAPAROSCOPIC NEPHROURETERECTOMY;  Surgeon: Alexis Frock, MD;  [facility];  Service: Urology;  Laterality: Left;   SURGERY ON FOOT FOR STAPH INFECTION  1989   TRANSURETHRAL RESECTION OF BLADDER TUMOR N/A 07/21/2017   Procedure: TRANSURETHRAL RESECTION OF BLADDER TUMOR (TURBT);  Surgeon: Alexis Frock, MD;  Location: [facility];  Service: Urology;  Laterality: N/A;   TRANSURETHRAL RESECTION OF BLADDER TUMOR WITH MITOMYCIN-C N/A 06/09/2017   Procedure: TRANSURETHRAL RESECTION OF BLADDER TUMOR WITH MITOMYCIN-C;  Surgeon: Alexis Frock, MD;  Location: [facility];  Service: Urology;  Laterality: N/A;   TUBAL LIGATION  1980    Vitals:   07/21/21 0919   Weight: 233 lb (105.7 kg)     Subjective Assessment - 07/21/21 0918     Subjective Pt returns for her 3 month L-Dex screen.    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 She underwent a right double lumpectomy with a sentinel node biopsy (1 negative node) on 04/17/2020. It is ER/PR positive and HER2 negative with a Ki67 of 5%. She has a history of bladder and renal carcinoma from 06/2017.                    L-DEX FLOWSHEETS - 07/21/21 0900       L-DEX LYMPHEDEMA SCREENING   Measurement Type Unilateral    L-DEX MEASUREMENT EXTREMITY Upper Extremity    POSITION  Standing    DOMINANT SIDE Right    At Risk Side Right    BASELINE SCORE (UNILATERAL) 4.1    L-DEX SCORE (UNILATERAL) -0.4    VALUE CHANGE (UNILAT) -4.5                                     PT Long Term Goals - 05/13/20 1444       PT LONG TERM GOAL #1   Title Patient will dmeonstrate she has regained full shoulder ROM and function post operatively compared to baselines.    Time 8    Period Weeks    Status Achieved                   Plan - 07/21/21 0920     Clinical Impression Statement Pt returns for her 3 month L-Dex screen. Her change from baseline of -4.5 is WNLs so no further treatment is required at this time except tp cont every 3 month L-Dex screens which pt is agreeable to.    PT Next Visit Plan Cont L-Dex screens every 3 months for up to 2 years from her SLNB, which will be around 04/17/22.    Consulted and Agree with Plan of Care Patient             Patient will benefit from skilled therapeutic intervention in order to improve the following deficits and impairments:     Visit Diagnosis: Aftercare following surgery for neoplasm     Problem List Patient Active Problem List   Diagnosis Date Noted   Hypothyroidism 04/30/2021   Thrombocytopenia ([place]) 02/17/2021   Port-A-Cath in  place 07/11/2020   Malignant neoplasm of upper-outer quadrant of right breast in female, estrogen receptor positive ([place]) 03/28/2020   Controlled type 2 diabetes mellitus with complication, with long-term current use of insulin ([place]) 03/19/2018   Hyperlipidemia 01/12/2018   Cerebral aneurysm 12/29/2017   Secondary hypertension, unspecified 12/29/2017   CKD (chronic kidney disease) stage 4, GFR 15-29 ml/min (H[facility]) 12/29/2017   Cancer of renal pelvis, left ([place]) 10/12/2016   Renal mass 10/09/2016    Otelia Limes, PTA 07/21/2021, 9:25 AM  [facility] @ [place]  [place], [place], [address] Phone: [phone]   Fax:  [phone]  Name: Tasha Tucker MRN: [id number] Date of Birth: 08/03/1951

## 2021-07-23 ENCOUNTER — Other Ambulatory Visit (INDEPENDENT_AMBULATORY_CARE_PROVIDER_SITE_OTHER): Payer: Medicare Other

## 2021-07-23 ENCOUNTER — Encounter: Payer: Self-pay | Admitting: Family Medicine

## 2021-07-23 ENCOUNTER — Other Ambulatory Visit: Payer: Self-pay

## 2021-07-23 DIAGNOSIS — E039 Hypothyroidism, unspecified: Secondary | ICD-10-CM | POA: Diagnosis not present

## 2021-07-23 LAB — TSH: TSH: 4.19 u[IU]/mL (ref 0.35–5.50)

## 2021-08-23 ENCOUNTER — Other Ambulatory Visit: Payer: Self-pay | Admitting: Family Medicine

## 2021-08-23 DIAGNOSIS — E039 Hypothyroidism, unspecified: Secondary | ICD-10-CM

## 2021-10-04 IMAGING — MG DIGITAL DIAGNOSTIC BILAT W/ TOMO W/ CAD
6 of 9 series · 6 of 25 positions shown · non-contrast
Comparison: Previous exam(s).

CLINICAL DATA: Right lumpectomy.  Annual mammography.

EXAM:
DIGITAL DIAGNOSTIC BILATERAL MAMMOGRAM WITH TOMOSYNTHESIS AND CAD
TECHNIQUE: Bilateral digital diagnostic mammography and breast tomosynthesis
was performed. The images were evaluated with computer-aided
detection.

[R CC]
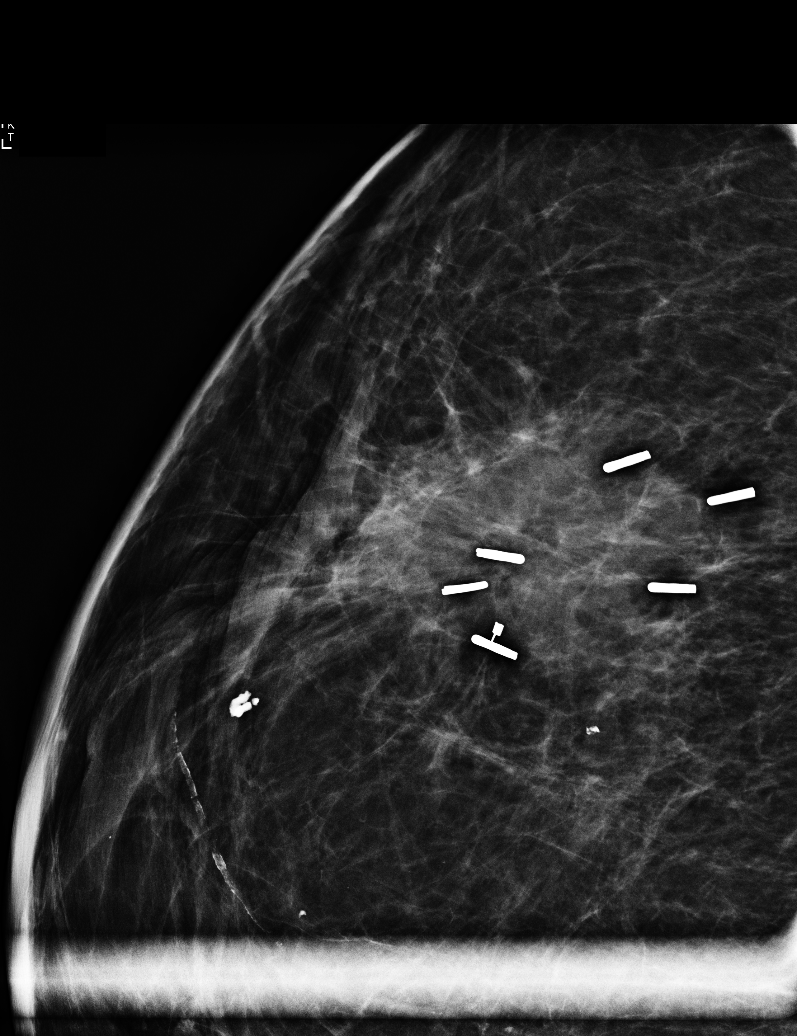

[R CC synth-2D]
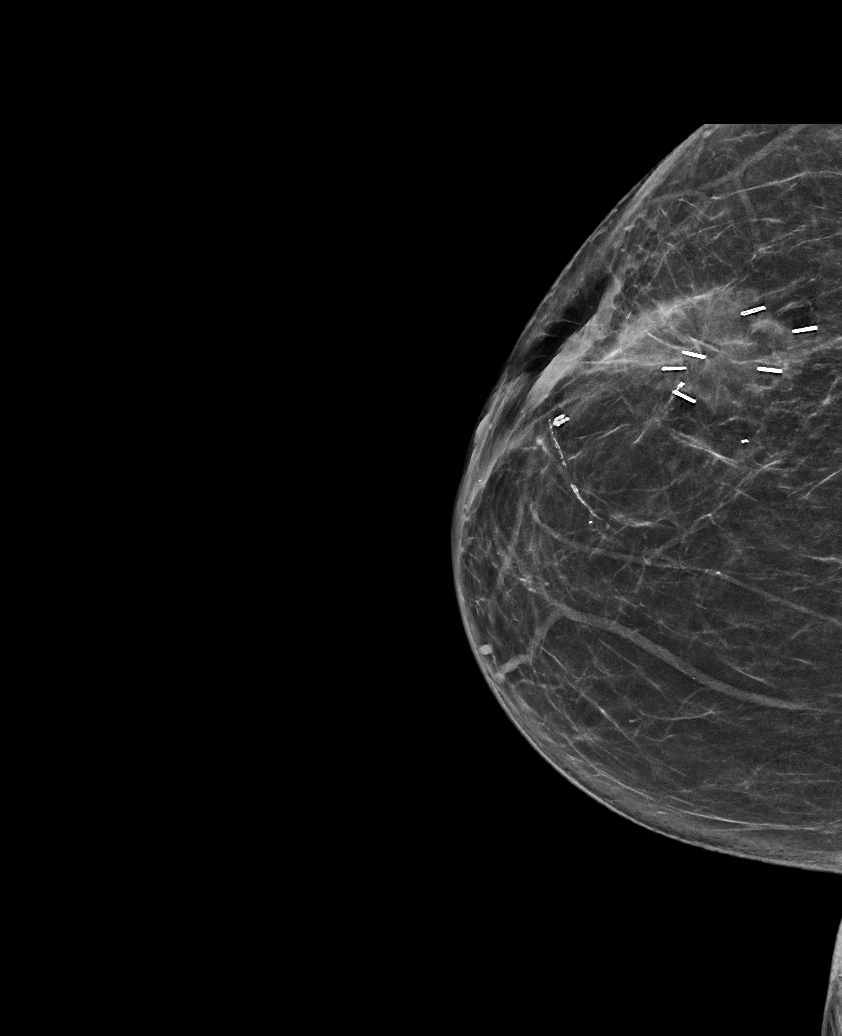

[L CC synth-2D]
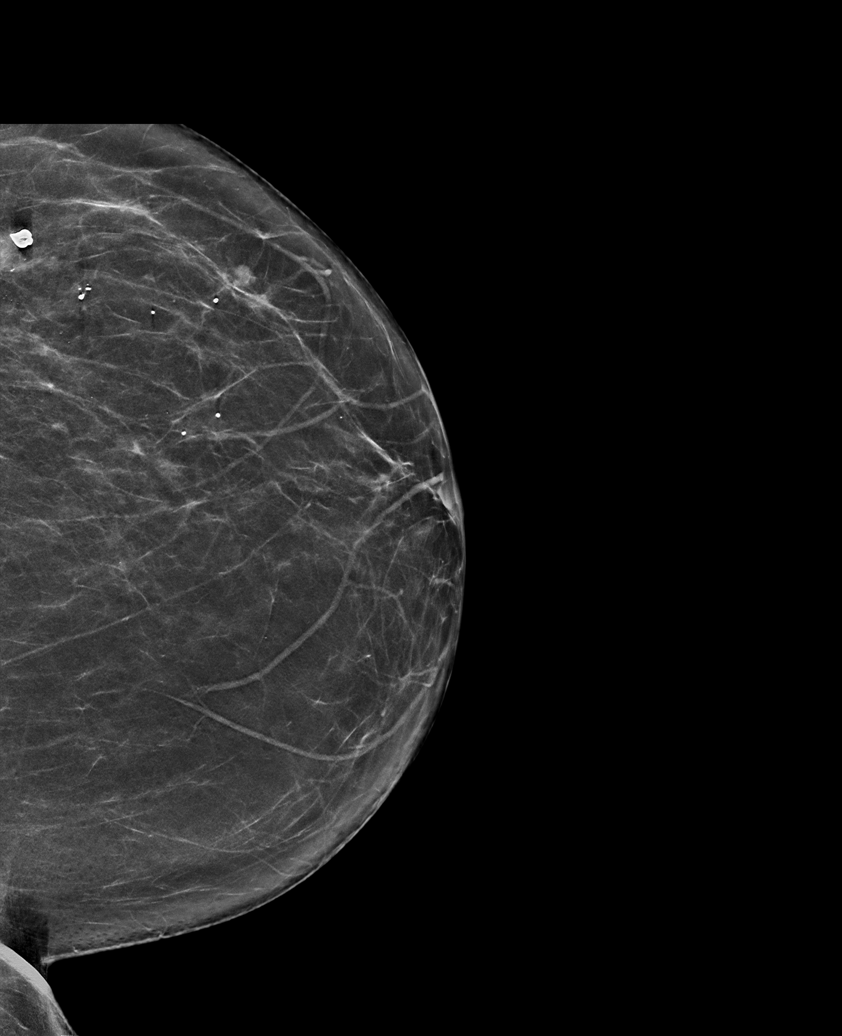

[L MLO synth-2D]
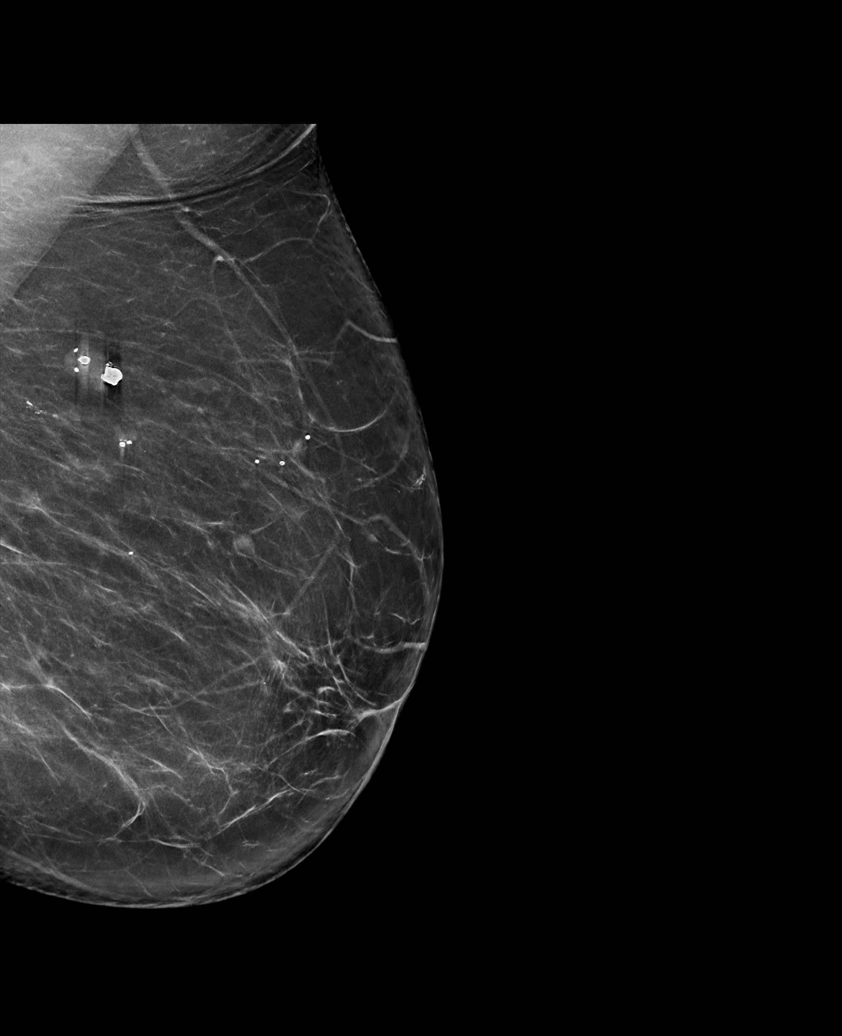

[R MLO synth-2D]
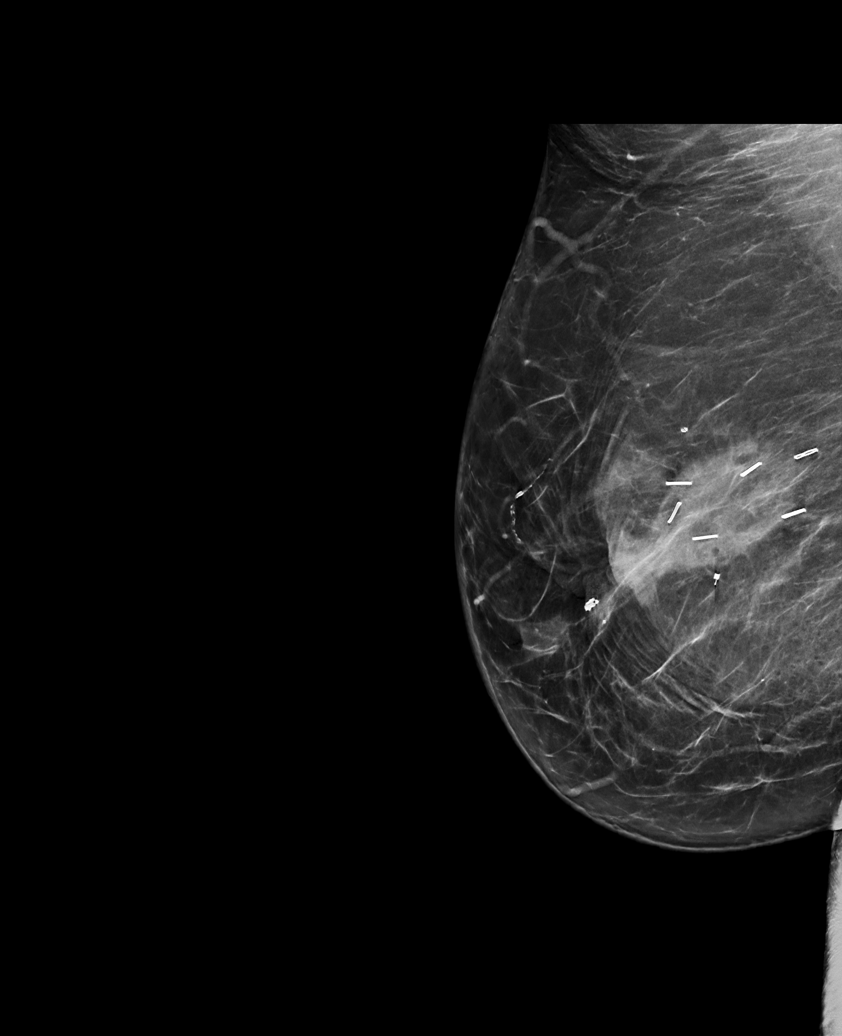

[L CC tomo · tomo slice 36/71.0]
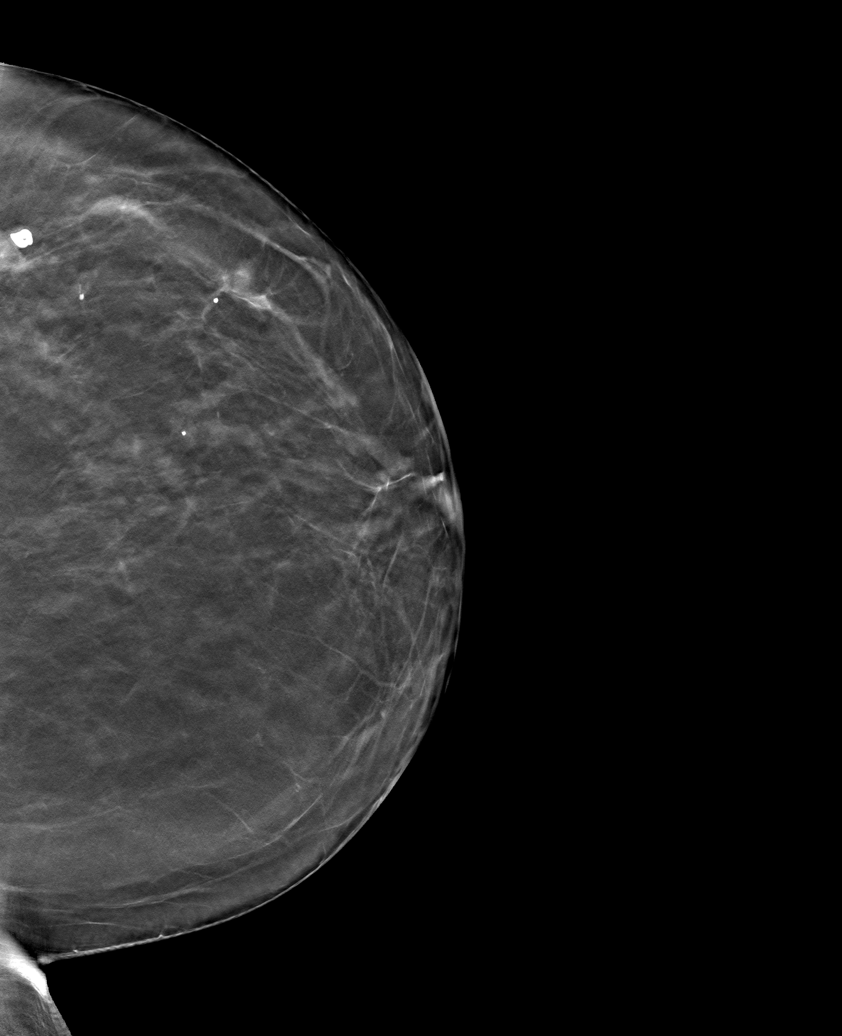

[6 of 25 positions shown; findings below may reference images not displayed]

ACR Breast Density Category b: There are scattered areas of
fibroglandular density.
FINDINGS: The right lumpectomy site appears as expected. No suspicious masses,
calcifications, or distortion are identified in either breast.
IMPRESSION: No mammographic evidence of malignancy.

RECOMMENDATION:
Annual diagnostic mammography.

I have discussed the findings and recommendations with the patient.
If applicable, a reminder letter will be sent to the patient
regarding the next appointment.

BI-RADS CATEGORY  2: Benign.

## 2021-10-20 ENCOUNTER — Other Ambulatory Visit: Payer: Self-pay

## 2021-10-20 ENCOUNTER — Ambulatory Visit: Payer: Medicare Other | Attending: Oncology

## 2021-10-20 VITALS — Wt 233.1 lb

## 2021-10-20 DIAGNOSIS — Z483 Aftercare following surgery for neoplasm: Secondary | ICD-10-CM | POA: Insufficient documentation

## 2021-10-20 NOTE — Therapy (Signed)
[place] °[facility] @ [place] °[address] °, [place], [address] °Phone: [phone]   Fax:  [phone] ° °Physical Therapy Treatment ° °Patient Details  °Name: Tasha Tucker °MRN: [id number] °Date of Birth: 01/21/1951 °No data recorded ° °Encounter Date: 10/20/2021 ° ° PT End of Session - 10/20/21 1049   ° ° Visit Number 2   # unchanged due to screen only  ° PT Start Time 1045   ° PT Stop Time 1050   ° PT Time Calculation (min) 5 min   ° Activity Tolerance Patient tolerated treatment well   ° Behavior During Therapy WFL for tasks assessed/performed   ° °  °  ° °  ° ° °Past Medical History:  °Diagnosis Date  ° Bladder cancer ([facility])   ° UROLOGIST- DR MANNY  ° Breast cancer ([facility]) 2021  ° right lumpectomy and chemo and radiation done  ° DJD (degenerative joint disease)   ° NECK, NEEDS PILLOW WHEN LYING FLAT IS POSSIBLE  ° History of cerebral aneurysm repair   ° 1996-- S/P CLAPPING RIGHT CAROTID --- NO DEFICITS  ° History of renal pelvis cancer   ° 10-09-2016  S/P  LEFT RADICAL NEPHROURETERECTOMY -- HIGH GRADE PAPILLARY UROTHELIAL CARCINOMA  ° Hyperlipidemia   ° Hypertension   ° Liver hemangioma   ° yrs ago per pt on 03-19-2021  ° OA (osteoarthritis)   ° PONV (postoperative nausea and vomiting)   ° Type 2 diabetes mellitus (HCC)   ° UTI (urinary tract infection)   ° finished antibiotics 03-01-2021 all symptoms resolved  ° Wears glasses 03/19/2021  ° ° °Past Surgical History:  °Procedure Laterality Date  ° ABDOMINAL HYSTERECTOMY  1990  ° PARTIAL  ° BREAST LUMPECTOMY WITH RADIOACTIVE SEED AND SENTINEL LYMPH NODE BIOPSY Right 04/17/2020  ° Procedure: BREAST LUMPECTOMY X 2  WITH RADIOACTIVE SEED AND SENTINEL LYMPH NODE BIOPSY;  Surgeon: Blackman, Douglas, MD;  [facility];  Service: General;  Laterality: Right;  ° CATARACT EXTRACTION W/ INTRAOCULAR LENS  IMPLANT, BILATERAL  2016; 2017  ° CEREBRAL ANEURYSM REPAIR  1996  ° clipped-no deficits RIGHT CAROTID  ° CHOLECYSTECTOMY   2010  ° laparoscopic  ° CYSTOSCOPY W/ RETROGRADES Right 06/09/2017  ° Procedure: CYSTOSCOPY WITH RETROGRADE PYELOGRAM;  Surgeon: Manny, Theodore, MD;  Location: [facility];  Service: Urology;  Laterality: Right;  ° CYSTOSCOPY W/ RETROGRADES Right 07/21/2017  ° Procedure: CYSTOSCOPY WITH RETROGRADE PYELOGRAM;  Surgeon: Manny, Theodore, MD;  Location: [facility];  Service: Urology;  Laterality: Right;  ° CYSTOSCOPY WITH RETROGRADE PYELOGRAM, URETEROSCOPY AND STENT PLACEMENT Left 08/26/2016  ° Procedure: CYSTOSCOPY WITH BILATERAL RETROGRADE PYELOGRAM,LEFT URETEROSCOPY WITH BIOPSY AND LEFT STENT PLACEMENT;  Surgeon: Theodore Manny, MD;  Location: [facility];  Service: Urology;  Laterality: Left;  ° PORT-A-CATH REMOVAL Left 03/24/2021  ° Procedure: PORT-A-CATH REMOVAL;  Surgeon: Blackman, Douglas, MD;  Location: [facility];  Service: General;  Laterality: Left;  ° PORTACATH PLACEMENT Left 06/18/2020  ° Procedure: INSERTION PORT-A-CATH WITH ULTRASOUND GUIDANCE;  Surgeon: Blackman, Douglas, MD;  [facility];  Service: General;  Laterality: Left;  ° ROBOT ASSITED LAPAROSCOPIC NEPHROURETERECTOMY Left 10/09/2016  ° Procedure: XI ROBOT ASSITED LAPAROSCOPIC NEPHROURETERECTOMY;  Surgeon: Theodore Manny, MD;  [facility];  Service: Urology;  Laterality: Left;  ° SURGERY ON FOOT FOR STAPH INFECTION  1989  ° TRANSURETHRAL RESECTION OF BLADDER TUMOR N/A 07/21/2017  ° Procedure: TRANSURETHRAL RESECTION OF BLADDER TUMOR (TURBT);  Surgeon:   Manny, Theodore, MD;  Location: [facility];  Service: Urology;  Laterality: N/A;  ° TRANSURETHRAL RESECTION OF BLADDER TUMOR WITH MITOMYCIN-C N/A 06/09/2017  ° Procedure: TRANSURETHRAL RESECTION OF BLADDER TUMOR WITH MITOMYCIN-C;  Surgeon: Manny, Theodore, MD;  Location: [facility];  Service: Urology;  Laterality: N/A;  ° TUBAL LIGATION  1980  ° ° °Vitals:  ° 10/20/21 1046  °Weight: 233 lb 2 oz  (105.7 kg)  ° ° ° Subjective Assessment - 10/20/21 1046   ° ° Subjective Pt returns for her 3 month L-Dex screen.   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 She underwent a right double lumpectomy with a sentinel node biopsy (1 negative node) on 04/17/2020. It is ER/PR positive and HER2 negative with a Ki67 of 5%. She has a history of bladder and renal carcinoma from 06/2017.   ° °  °  ° °  ° ° ° ° ° ° ° ° ° L-DEX FLOWSHEETS - 10/20/21 1000   ° °  ° L-DEX LYMPHEDEMA SCREENING  ° Measurement Type Unilateral   ° L-DEX MEASUREMENT EXTREMITY Upper Extremity   ° POSITION  Standing   ° DOMINANT SIDE Right   ° At Risk Side Right   ° BASELINE SCORE (UNILATERAL) 4.1   ° L-DEX SCORE (UNILATERAL) -0.9   ° VALUE CHANGE (UNILAT) -5   ° °  °  ° °  ° ° ° ° ° ° ° ° ° ° ° ° ° ° ° ° ° ° ° ° ° ° ° ° ° ° PT Long Term Goals - 05/13/20 1444   ° °  ° PT LONG TERM GOAL #1  ° Title Patient will dmeonstrate she has regained full shoulder ROM and function post operatively compared to baselines.   ° Time 8   ° Period Weeks   ° Status Achieved   ° °  °  ° °  ° ° ° ° ° ° ° ° Plan - 10/20/21 1052   ° ° Clinical Impression Statement Pt returns for her 3 month L-Dex screen. Her change from baseline of -5 is WNLs so no further treatment is required at this time except to cont every 3 month L-Dex screens which pt is agreeable to. She only has 1 more 3 month screen before she will be near 2 years out from her surgery and can transition to 6 months x1 year. Pt verbalized understanding.   ° PT Next Visit Plan Cont L-Dex screens every 3 months for up to 2 years from her SLNB, which will be around 04/17/22.   ° Consulted and Agree with Plan of Care Patient   ° °  °  ° °  ° ° °Patient will benefit from skilled therapeutic intervention in order to improve the following deficits and impairments:    ° °Visit Diagnosis: °Aftercare following surgery for neoplasm ° ° ° ° °Problem  List °Patient Active Problem List  ° Diagnosis Date Noted  ° Hypothyroidism 04/30/2021  ° Thrombocytopenia (HCC) 02/17/2021  ° Port-A-Cath in place 07/11/2020  ° Malignant neoplasm of upper-outer quadrant of right breast in female, estrogen receptor positive (HCC) 03/28/2020  ° Controlled type 2 diabetes mellitus with complication, with long-term current use of insulin (HCC) 03/19/2018  ° Hyperlipidemia 01/12/2018  ° Cerebral aneurysm 12/29/2017  ° Secondary hypertension, unspecified 12/29/2017  ° CKD (chronic kidney disease) stage 4, GFR 15-29 ml/min (HCC) 12/29/2017  ° Cancer of   renal pelvis, left ([place]) 10/12/2016   Renal mass 10/09/2016    Otelia Limes, PTA 10/20/2021, 10:55 AM  [facility] @ [facility] [employer] [place], [place], [address] Phone: [phone]   Fax:  [phone]  Name: MARISSIA BLACKHAM MRN: [id number] Date of Birth: 1951-02-15

## 2021-11-22 ENCOUNTER — Other Ambulatory Visit: Payer: Self-pay | Admitting: Family Medicine

## 2021-11-22 DIAGNOSIS — E782 Mixed hyperlipidemia: Secondary | ICD-10-CM

## 2021-11-27 ENCOUNTER — Encounter: Payer: Self-pay | Admitting: Family Medicine

## 2021-11-28 ENCOUNTER — Encounter: Payer: Self-pay | Admitting: Family Medicine

## 2021-12-12 ENCOUNTER — Encounter: Payer: Self-pay | Admitting: Family Medicine

## 2021-12-12 LAB — COLOGUARD: COLOGUARD: NEGATIVE

## 2022-01-19 ENCOUNTER — Ambulatory Visit: Payer: Medicare Other

## 2022-02-04 LAB — BASIC METABOLIC PANEL
BUN: 31 — AB (ref 4–21)
CO2: 23 — AB (ref 13–22)
Chloride: 102 (ref 99–108)
Creatinine: 1.5 — AB (ref 0.5–1.1)
Glucose: 290
Potassium: 4.6 mEq/L (ref 3.5–5.1)
Sodium: 135 — AB (ref 137–147)

## 2022-02-04 LAB — COMPREHENSIVE METABOLIC PANEL
Albumin: 4 (ref 3.5–5.0)
Calcium: 9.9 (ref 8.7–10.7)
eGFR: 36

## 2022-02-13 ENCOUNTER — Other Ambulatory Visit: Payer: Self-pay

## 2022-03-03 ENCOUNTER — Other Ambulatory Visit: Payer: Self-pay | Admitting: Oncology

## 2022-03-17 ENCOUNTER — Other Ambulatory Visit: Payer: Self-pay | Admitting: Family Medicine

## 2022-03-17 DIAGNOSIS — Z9889 Other specified postprocedural states: Secondary | ICD-10-CM

## 2022-04-13 ENCOUNTER — Ambulatory Visit: Payer: Medicare Other | Attending: Surgery

## 2022-04-13 VITALS — Wt 231.1 lb

## 2022-04-13 DIAGNOSIS — Z483 Aftercare following surgery for neoplasm: Secondary | ICD-10-CM | POA: Insufficient documentation

## 2022-04-13 NOTE — Therapy (Addendum)
OUTPATIENT PHYSICAL THERAPY SOZO SCREENING NOTE   Patient Name: Tasha Tucker MRN: [id number] DOB:[birth year], 71 y.o., female Today's Date: 04/13/2022  PCP: [name], MD REFERRING PROVIDER: Abigail Miyamoto, MD   PT End of Session - 04/13/22 0856     Visit Number 2   # unchanged due to screen only   PT Start Time 0855    PT Stop Time 0900    PT Time Calculation (min) 5 min    Activity Tolerance Patient tolerated treatment well    Behavior During Therapy WFL for tasks assessed/performed             Past Medical History:  Diagnosis Date   Bladder cancer ([facility])    UROLOGIST- DR [facility]   Breast cancer ([facility]) 2021   right lumpectomy and chemo and radiation done   DJD (degenerative joint disease)    NECK, NEEDS PILLOW WHEN LYING FLAT IS POSSIBLE   History of cerebral aneurysm repair    1996-- S/P CLAPPING RIGHT CAROTID --- NO DEFICITS   History of renal pelvis cancer    10-09-2016  S/P  LEFT RADICAL NEPHROURETERECTOMY -- HIGH GRADE PAPILLARY UROTHELIAL CARCINOMA   Hyperlipidemia    Hypertension    Liver hemangioma    yrs ago per pt on 03-19-2021   OA (osteoarthritis)    PONV (postoperative nausea and vomiting)    Type 2 diabetes mellitus (HCC)    UTI (urinary tract infection)    finished antibiotics 03-01-2021 all symptoms resolved   Wears glasses 03/19/2021   Past Surgical History:  Procedure Laterality Date   ABDOMINAL HYSTERECTOMY  1990   PARTIAL   BREAST LUMPECTOMY WITH RADIOACTIVE SEED AND SENTINEL LYMPH NODE BIOPSY Right 04/17/2020   Procedure: BREAST LUMPECTOMY X 2  WITH RADIOACTIVE SEED AND SENTINEL LYMPH NODE BIOPSY;  Surgeon: Abigail Miyamoto, MD;  [facility];  Service: General;  Laterality: Right;   CATARACT EXTRACTION W/ INTRAOCULAR LENS  IMPLANT, BILATERAL  2016; 2017   CEREBRAL ANEURYSM REPAIR  1996   clipped-no deficits RIGHT CAROTID   CHOLECYSTECTOMY  2010   laparoscopic   CYSTOSCOPY W/ RETROGRADES Right 06/09/2017   Procedure:  CYSTOSCOPY WITH RETROGRADE PYELOGRAM;  Surgeon: Sebastian Ache, MD;  Location: [facility];  Service: Urology;  Laterality: Right;   CYSTOSCOPY W/ RETROGRADES Right 07/21/2017   Procedure: CYSTOSCOPY WITH RETROGRADE PYELOGRAM;  Surgeon: Sebastian Ache, MD;  Location: [facility];  Service: Urology;  Laterality: Right;   CYSTOSCOPY WITH RETROGRADE PYELOGRAM, URETEROSCOPY AND STENT PLACEMENT Left 08/26/2016   Procedure: CYSTOSCOPY WITH BILATERAL RETROGRADE PYELOGRAM,LEFT URETEROSCOPY WITH BIOPSY AND LEFT STENT PLACEMENT;  Surgeon: Sebastian Ache, MD;  Location: [facility];  Service: Urology;  Laterality: Left;   PORT-A-CATH REMOVAL Left 03/24/2021   Procedure: PORT-A-CATH REMOVAL;  Surgeon: Abigail Miyamoto, MD;  Location: [facility];  Service: General;  Laterality: Left;   PORTACATH PLACEMENT Left 06/18/2020   Procedure: INSERTION PORT-A-CATH WITH ULTRASOUND GUIDANCE;  Surgeon: Abigail Miyamoto, MD;  [facility];  Service: General;  Laterality: Left;   ROBOT ASSITED LAPAROSCOPIC NEPHROURETERECTOMY Left 10/09/2016   Procedure: XI ROBOT ASSITED LAPAROSCOPIC NEPHROURETERECTOMY;  Surgeon: Sebastian Ache, MD;  [facility];  Service: Urology;  Laterality: Left;   SURGERY ON FOOT FOR STAPH INFECTION  1989   TRANSURETHRAL RESECTION OF BLADDER TUMOR N/A 07/21/2017   Procedure: TRANSURETHRAL RESECTION OF BLADDER TUMOR (TURBT);  Surgeon: Sebastian Ache, MD;  Location: [facility]  Service: Urology;  Laterality: N/A;  TRANSURETHRAL RESECTION OF BLADDER TUMOR WITH MITOMYCIN-C N/A 06/09/2017   Procedure: TRANSURETHRAL RESECTION OF BLADDER TUMOR WITH MITOMYCIN-C;  Surgeon: Alexis Frock, MD;  Location: [facility];  Service: Urology;  Laterality: N/A;   TUBAL LIGATION  1980   Patient Active Problem List   Diagnosis Date Noted   Hypothyroidism 04/30/2021   Thrombocytopenia ([place]) 02/17/2021   Port-A-Cath  in place 07/11/2020   Malignant neoplasm of upper-outer quadrant of right breast in female, estrogen receptor positive ([place]) 03/28/2020   Controlled type 2 diabetes mellitus with complication, with long-term current use of insulin ([place]) 03/19/2018   Hyperlipidemia 01/12/2018   Cerebral aneurysm 12/29/2017   Secondary hypertension, unspecified 12/29/2017   CKD (chronic kidney disease) stage 4, GFR 15-29 ml/min ([place]) 12/29/2017   Cancer of renal pelvis, left ([place]) 10/12/2016   Renal mass 10/09/2016    REFERRING DIAG: right breast cancer at risk for lymphedema  THERAPY DIAG: Aftercare following surgery for neoplasm  PERTINENT HISTORY: Patient was diagnosed on 03/13/2020 with 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 She underwent a right double lumpectomy with a sentinel node biopsy (1 negative node) on 04/17/2020. It is ER/PR positive and HER2 negative with a Ki67 of 5%. She has a history of bladder and renal carcinoma from 06/2017  PRECAUTIONS: right UE Lymphedema risk, None  SUBJECTIVE: Pt returns for her 3 month L-Dex screen.   PAIN:  Are you having pain? No  SOZO SCREENING: Patient was assessed today using the SOZO machine to determine the lymphedema index score. This was compared to her baseline score. It was determined that she is within the recommended range when compared to her baseline and no further action is needed at this time. She will continue SOZO screenings. These are done every 3 months for 2 years post operatively followed by every 6 months for 2 years, and then annually.  P: Pt plans to come annually now per her request as her changes from baseline have remained low.    L-DEX FLOWSHEETS - 04/13/22 1600       L-DEX LYMPHEDEMA SCREENING   Measurement Type Unilateral    L-DEX MEASUREMENT EXTREMITY Upper Extremity    POSITION  Standing    DOMINANT SIDE Right    At Risk Side Right    BASELINE SCORE (UNILATERAL) 4.1    L-DEX SCORE (UNILATERAL) -1.4     VALUE CHANGE (UNILAT) -5.5               Otelia Limes, PTA 04/13/2022, 8:58 AM

## 2022-04-26 NOTE — Progress Notes (Addendum)
[facility] [address], [place], [place] [address] [phone]  Date:  05/04/2022   Name:  Tasha Tucker   DOB:  04-19-1951   MRN:  [id number]  PCP:  Darreld Mclean, MD    Chief Complaint: Annual Exam (Concerns/ questions: Leisa Lenz due, Foot exam due, A1C due/Pna: none recent /Eye exam: 12/22/21- [facility] Jerline Pain)   History of Present Illness:  Tasha Tucker is a 71 y.o. very pleasant female patient who presents with the following:  Pt seen today for a CPE Last seen by myself about one year ago   History of breast cancer diagnosed 2021-her oncologist is Dr. Jana Hakim. She was treated with definitive surgical management, completed radiation and is currently taking anastrozole-we will use this for 5 years.  She may get some hot flashes but nothing more    Also with history of diabetes, hyperlipidemia, stage IV chronic kidney disease, renal and bladder cancer, cerebral aneurysm status post repair, hypertension She had a left nephrectomy in 2018, brain aneurysm clipping in 1990 Nephrologist- Hollie Salk.  Last visit was in May- I will request this  Urologist is Dr. Tresa Moore- visit is coming up. She does an annual recheck  Endocrinologist is Dr. Rosalee Kaufman Dr Avermini moved away so she is now seeing a NP with [facility]- Eilene Ghazi  She is currently using Lantus 18 units, Humalog KwikPen as needed with meals.  A1c 7.4%  Recent A1c per endocrinology- it was 9.5% 7/19, she is now using her CGM Eye exam: she was seen in March   Since our last visit she had surgery for bilateral trigger finger She is using CGM now and notes that this is really helping her     Patient Active Problem List   Diagnosis Date Noted   Hypothyroidism 04/30/2021   Thrombocytopenia ([place]) 02/17/2021   Port-A-Cath in place 07/11/2020   Malignant neoplasm of upper-outer quadrant of right breast in female, estrogen receptor positive ([place])  03/28/2020   Controlled type 2 diabetes mellitus with complication, with long-term current use of insulin ([place]) 03/19/2018   Hyperlipidemia 01/12/2018   Cerebral aneurysm 12/29/2017   Secondary hypertension, unspecified 12/29/2017   CKD (chronic kidney disease) stage 4, GFR 15-29 ml/min ([place]) 12/29/2017   Cancer of renal pelvis, 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   Renal mass 10/09/2016    Past Medical History:  Diagnosis Date   Bladder cancer ([place])    UROLOGIST- DR [facility]   Breast cancer ([place]) 2021   right lumpectomy and chemo and radiation done   DJD (degenerative joint disease)    NECK, NEEDS PILLOW WHEN LYING FLAT IS POSSIBLE   History of cerebral aneurysm repair    1996-- S/P CLAPPING RIGHT CAROTID --- NO DEFICITS   History of renal pelvis cancer    10-09-2016  S/P  LEFT RADICAL NEPHROURETERECTOMY -- HIGH GRADE PAPILLARY UROTHELIAL CARCINOMA   Hyperlipidemia    Hypertension    Liver hemangioma    yrs ago per pt on 03-19-2021   OA (osteoarthritis)    PONV (postoperative nausea and vomiting)    Type 2 diabetes mellitus ([place])    UTI (urinary tract infection)    finished antibiotics 03-01-2021 all symptoms resolved   Wears glasses 03/19/2021    Past Surgical History:  Procedure Laterality Date   ABDOMINAL HYSTERECTOMY  1990   PARTIAL   BREAST LUMPECTOMY WITH RADIOACTIVE SEED AND SENTINEL LYMPH NODE BIOPSY Right 04/17/2020   Procedure: BREAST LUMPECTOMY  X 2  WITH RADIOACTIVE SEED AND SENTINEL LYMPH NODE BIOPSY;  Surgeon: Coralie Keens, MD;  Location: [facility];  Service: General;  Laterality: Right;   CATARACT EXTRACTION W/ INTRAOCULAR LENS  IMPLANT, BILATERAL  2016; 2017   CEREBRAL ANEURYSM REPAIR  1996   clipped-no deficits RIGHT CAROTID   CHOLECYSTECTOMY  2010   laparoscopic   CYSTOSCOPY W/ RETROGRADES Right 06/09/2017   Procedure: CYSTOSCOPY WITH RETROGRADE PYELOGRAM;  Surgeon: Alexis Frock, MD;  Location: [facility];  Service: Urology;  Laterality: Right;    CYSTOSCOPY W/ RETROGRADES Right 07/21/2017   Procedure: CYSTOSCOPY WITH RETROGRADE PYELOGRAM;  Surgeon: Alexis Frock, MD;  Location: [facility];  Service: Urology;  Laterality: Right;   CYSTOSCOPY WITH RETROGRADE PYELOGRAM, URETEROSCOPY AND STENT PLACEMENT Left 08/26/2016   Procedure: CYSTOSCOPY WITH BILATERAL RETROGRADE PYELOGRAM,LEFT URETEROSCOPY WITH BIOPSY AND LEFT STENT PLACEMENT;  Surgeon: Alexis Frock, MD;  Location: [facility];  Service: Urology;  Laterality: Left;   PORT-A-CATH REMOVAL Left 03/24/2021   Procedure: PORT-A-CATH REMOVAL;  Surgeon: Coralie Keens, MD;  Location: [facility];  Service: General;  Laterality: Left;   PORTACATH PLACEMENT Left 06/18/2020   Procedure: INSERTION PORT-A-CATH WITH ULTRASOUND GUIDANCE;  Surgeon: Coralie Keens, MD;  Location: [facility];  Service: General;  Laterality: Left;   ROBOT ASSITED LAPAROSCOPIC NEPHROURETERECTOMY Left 10/09/2016   Procedure: XI ROBOT ASSITED LAPAROSCOPIC NEPHROURETERECTOMY;  Surgeon: Alexis Frock, MD;  Location: [facility];  Service: Urology;  Laterality: Left;   SURGERY ON FOOT FOR STAPH INFECTION  1989   TRANSURETHRAL RESECTION OF BLADDER TUMOR N/A 07/21/2017   Procedure: TRANSURETHRAL RESECTION OF BLADDER TUMOR (TURBT);  Surgeon: Alexis Frock, MD;  Location: [facility];  Service: Urology;  Laterality: N/A;   TRANSURETHRAL RESECTION OF BLADDER TUMOR WITH MITOMYCIN-C N/A 06/09/2017   Procedure: TRANSURETHRAL RESECTION OF BLADDER TUMOR WITH MITOMYCIN-C;  Surgeon: Alexis Frock, MD;  Location: [facility];  Service: Urology;  Laterality: N/A;   TUBAL LIGATION  1980    Social History   Tobacco Use   Smoking status: Never   Smokeless tobacco: Never  Vaping Use   Vaping Use: Never used  Substance Use Topics   Alcohol use: Not Currently    Comment: , STOPPED   Drug use: No    Family History  Problem Relation Age of Onset   Cancer  Mother    Hyperlipidemia Father    Heart attack Father    Diabetes Brother    Breast cancer Neg Hx     Allergies  Allergen Reactions   Other Other (See Comments)   Adhesive [Tape] Rash    PAPER TAPE ONLY to use   Chlorhexidine Gluconate Itching and Rash    Topical antiseptic (CHG WIPES)    Dilantin [Phenytoin] Rash   Penicillins Rash    Has patient had a PCN reaction causing immediate rash, facial/tongue/throat swelling, SOB or lightheadedness with hypotension:unsure Has patient had a PCN reaction causing severe rash involving mucus membranes or skin necrosis:unsure Has patient had a PCN reaction that required hospitalization:No Has patient had a PCN reaction occurring within the last 10 years:No If all of the above answers are "NO", then may proceed with Cephalosporin use.    Sulfa Antibiotics Rash    Medication list has been reviewed and updated.  Current Outpatient Medications on File Prior to Visit  Medication Sig Dispense Refill   acetaminophen (TYLENOL) 500 MG tablet Take 500 mg by mouth at bedtime.  anastrozole (ARIMIDEX) 1 MG tablet Take 1 tablet (1 mg total) by mouth daily. 90 tablet 4   BD PEN NEEDLE NANO U/F 32G X 4 MM MISC USE FOR INSULIN ADMINISTRATION DAILY 100 each 3   Biotin 10 MG CAPS Take by mouth 2 (two) times daily. 5000 mcg bid per pt 30 capsule    carvedilol (COREG) 3.125 MG tablet 1 tablet     cetirizine (ZYRTEC) 10 MG tablet Take 10 mg by mouth every morning.     HUMALOG KWIKPEN 100 UNIT/ML KwikPen Inject 7 Units into the skin 3 (three) times daily. Usually takes 6 units     insulin glargine (LANTUS SOLOSTAR) 100 UNIT/ML Solostar Pen Inject 26 Units into the skin every evening.     levothyroxine (SYNTHROID) 50 MCG tablet Take 1 tablet (50 mcg total) by mouth daily. 90 tablet 3   ONETOUCH DELICA LANCETS 35T MISC Use lancet to stick finger and check blood sugar two times daily. 100 each 6   ONETOUCH VERIO test strip USE AS DIRECTED. 300 each 2   OVER  THE COUNTER MEDICATION Vitamin d 3 50 mcg daily     pravastatin (PRAVACHOL) 40 MG tablet TAKE 1 TABLET BY MOUTH EVERY DAY 90 tablet 3   No current facility-administered medications on file prior to visit.    Review of Systems:  As per HPI- otherwise negative.   Physical Examination: Vitals:   05/04/22 0847  BP: 124/80  Pulse: 66  Resp: 18  Temp: 97.7 F (36.5 C)  SpO2: 98%   Vitals:   05/04/22 0847  Weight: 229 lb 12.8 oz (104.2 kg)  Height: '5\' 8"'$  (1.727 m)   Body mass index is 34.94 kg/m. Ideal Body Weight: Weight in (lb) to have BMI = 25: 164.1  GEN: no acute distress.  Obese, looks well  HEENT: Atraumatic, Normocephalic.  Ears and Nose: No external deformity. CV: RRR, No M/G/R. No JVD. No thrill. No extra heart sounds. PULM: CTA B, no wheezes, crackles, rhonchi. No retractions. No resp. distress. No accessory muscle use. ABD: S, NT, ND, +BS. No rebound. No HSM. EXTR: No c/c/e PSYCH: Normally interactive. Conversant.    Assessment and Plan: Physical exam  Hypothyroidism (acquired) - Plan: TSH  Stage 3 chronic kidney disease, unspecified whether stage 3a or 3b CKD ([place])  Mixed hyperlipidemia - Plan: Lipid panel  Controlled type 2 diabetes mellitus with complication, with long-term current use of insulin ([place]) - Plan: Comprehensive metabolic panel  Immunization due - Plan: Pneumococcal polysaccharide vaccine 23-valent greater than or equal to 2yo subcutaneous/IM  Physical exam- encouraged healthy diet and exercise routine  Update pneumonia vaccine today, discussed flu and covid boosters for the fall Will plan further follow- up pending labs.  Discussed  Signed Lamar Blinks, MD  Received labs, message to pt  Results for orders placed or performed in visit on 05/04/22  Comprehensive metabolic panel  Result Value Ref Range   Sodium 140 135 - 145 mEq/L   Potassium 4.4 3.5 - 5.1 mEq/L   Chloride 104 96 - 112 mEq/L   CO2 23 19 - 32 mEq/L   Glucose,  Bld 171 (H) 70 - 99 mg/dL   BUN 31 (H) 6 - 23 mg/dL   Creatinine, Ser 1.80 (H) 0.40 - 1.20 mg/dL   Total Bilirubin 0.6 0.2 - 1.2 mg/dL   Alkaline Phosphatase 55 39 - 117 U/L   AST 25 0 - 37 U/L   ALT 28 0 - 35 U/L   Total  Protein 6.7 6.0 - 8.3 g/dL   Albumin 3.9 3.5 - 5.2 g/dL   GFR 27.69 (L) >60.00 mL/min   Calcium 9.8 8.4 - 10.5 mg/dL  Lipid panel  Result Value Ref Range   Cholesterol 159 0 - 200 mg/dL   Triglycerides 149.0 0.0 - 149.0 mg/dL   HDL 59.50 >39.00 mg/dL   VLDL 29.8 0.0 - 40.0 mg/dL   LDL Cholesterol 70 0 - 99 mg/dL   Total CHOL/HDL Ratio 3    NonHDL 99.36   TSH  Result Value Ref Range   TSH 2.44 0.35 - 5.50 uIU/mL

## 2022-04-29 ENCOUNTER — Ambulatory Visit
Admission: RE | Admit: 2022-04-29 | Discharge: 2022-04-29 | Disposition: A | Discharge status: Home / Self Care | Payer: Medicare Other | Source: Ambulatory Visit | Attending: Family Medicine | Admitting: Family Medicine

## 2022-04-29 DIAGNOSIS — Z9889 Other specified postprocedural states: Secondary | ICD-10-CM

## 2022-05-04 ENCOUNTER — Encounter: Payer: Self-pay | Admitting: Family Medicine

## 2022-05-04 ENCOUNTER — Ambulatory Visit (INDEPENDENT_AMBULATORY_CARE_PROVIDER_SITE_OTHER): Payer: Medicare Other | Admitting: Family Medicine

## 2022-05-04 VITALS — BP 124/80 | HR 66 | Temp 97.7°F | Resp 18 | Ht 68.0 in | Wt 229.8 lb

## 2022-05-04 DIAGNOSIS — E039 Hypothyroidism, unspecified: Secondary | ICD-10-CM

## 2022-05-04 DIAGNOSIS — Z0000 Encounter for general adult medical examination without abnormal findings: Secondary | ICD-10-CM

## 2022-05-04 DIAGNOSIS — N183 Chronic kidney disease, stage 3 unspecified: Secondary | ICD-10-CM | POA: Diagnosis not present

## 2022-05-04 DIAGNOSIS — Z794 Long term (current) use of insulin: Secondary | ICD-10-CM | POA: Diagnosis not present

## 2022-05-04 DIAGNOSIS — Z23 Encounter for immunization: Secondary | ICD-10-CM | POA: Diagnosis not present

## 2022-05-04 DIAGNOSIS — E118 Type 2 diabetes mellitus with unspecified complications: Secondary | ICD-10-CM | POA: Diagnosis not present

## 2022-05-04 DIAGNOSIS — E782 Mixed hyperlipidemia: Secondary | ICD-10-CM | POA: Diagnosis not present

## 2022-05-04 LAB — LIPID PANEL
Cholesterol: 159 mg/dL (ref 0–200)
HDL: 59.5 mg/dL (ref >39.00)
LDL Cholesterol: 70 mg/dL (ref 0–99)
NonHDL: 99.36
Total CHOL/HDL Ratio: 3
Triglycerides: 149 mg/dL (ref 0.0–149.0)
VLDL: 29.8 mg/dL (ref 0.0–40.0)

## 2022-05-04 LAB — COMPREHENSIVE METABOLIC PANEL
ALT: 28 U/L (ref 0–35)
AST: 25 U/L (ref 0–37)
Albumin: 3.9 g/dL (ref 3.5–5.2)
Alkaline Phosphatase: 55 U/L (ref 39–117)
BUN: 31 mg/dL — ABNORMAL HIGH (ref 6–23)
CO2: 23 mEq/L (ref 19–32)
Calcium: 9.8 mg/dL (ref 8.4–10.5)
Chloride: 104 mEq/L (ref 96–112)
Creatinine, Ser: 1.8 mg/dL — ABNORMAL HIGH (ref 0.40–1.20)
GFR: 27.69 mL/min — ABNORMAL LOW (ref >60.00)
Glucose, Bld: 171 mg/dL — ABNORMAL HIGH (ref 70–99)
Potassium: 4.4 mEq/L (ref 3.5–5.1)
Sodium: 140 mEq/L (ref 135–145)
Total Bilirubin: 0.6 mg/dL (ref 0.2–1.2)
Total Protein: 6.7 g/dL (ref 6.0–8.3)

## 2022-05-04 LAB — TSH: TSH: 2.44 u[IU]/mL (ref 0.35–5.50)

## 2022-05-04 NOTE — Patient Instructions (Signed)
Good to see you again today!  I hope your do-over cruise is a lot of fun Please see me in about 6 months assuming all is well  Ok to get your flu shot and a covid booster prior to your trip this fall

## 2022-05-20 ENCOUNTER — Telehealth: Payer: Self-pay | Admitting: Family Medicine

## 2022-05-20 NOTE — Telephone Encounter (Signed)
Left message for patient to call back and schedule Medicare Annual Wellness Visit (AWV).   Please offer to do virtually or by telephone.  Left office number and my jabber [phone].  Last AWV:05/07/2021  Please schedule at anytime with Nurse Health Advisor.

## 2022-05-25 ENCOUNTER — Ambulatory Visit (HOSPITAL_COMMUNITY)
Admission: RE | Admit: 2022-05-25 | Discharge: 2022-05-25 | Disposition: A | Discharge status: Home / Self Care | Payer: Medicare Other | Source: Ambulatory Visit | Attending: Urology | Admitting: Urology

## 2022-05-25 ENCOUNTER — Other Ambulatory Visit (HOSPITAL_COMMUNITY): Payer: Self-pay | Admitting: Urology

## 2022-05-25 DIAGNOSIS — C642 Malignant neoplasm of left kidney, except renal pelvis: Secondary | ICD-10-CM | POA: Diagnosis present

## 2022-06-11 ENCOUNTER — Encounter: Payer: Self-pay | Admitting: Family Medicine

## 2022-06-17 ENCOUNTER — Other Ambulatory Visit: Payer: Self-pay

## 2022-06-17 ENCOUNTER — Inpatient Hospital Stay: Payer: Medicare Other | Attending: Hematology and Oncology | Admitting: Hematology and Oncology

## 2022-06-17 VITALS — BP 138/89 | HR 80 | Temp 97.9°F | Resp 16 | Ht 68.0 in | Wt 234.1 lb

## 2022-06-17 DIAGNOSIS — Z923 Personal history of irradiation: Secondary | ICD-10-CM | POA: Diagnosis not present

## 2022-06-17 DIAGNOSIS — C50411 Malignant neoplasm of upper-outer quadrant of right female breast: Secondary | ICD-10-CM | POA: Diagnosis present

## 2022-06-17 DIAGNOSIS — Z905 Acquired absence of kidney: Secondary | ICD-10-CM | POA: Insufficient documentation

## 2022-06-17 DIAGNOSIS — Z79899 Other long term (current) drug therapy: Secondary | ICD-10-CM | POA: Insufficient documentation

## 2022-06-17 DIAGNOSIS — Z79811 Long term (current) use of aromatase inhibitors: Secondary | ICD-10-CM | POA: Diagnosis not present

## 2022-06-17 DIAGNOSIS — Z170 Estrogen receptor positive status [ER+]: Secondary | ICD-10-CM | POA: Insufficient documentation

## 2022-06-17 NOTE — Progress Notes (Signed)
[name]  [phone]) [phone] Fax:(336) [phone]     ID: STALEY BUDZINSKI DOB: 05-27-1951  MR#: [id number]  XTK#:[id number]  Patient Care Team: Darreld Mclean, MD as PCP - General (Family Medicine) Alexis Frock, MD as Consulting Physician (Urology) Rockwell Germany, RN as Oncology Nurse Navigator Mauro Kaufmann, RN as Oncology Nurse Navigator Coralie Keens, MD as Consulting Physician (General Surgery) Magrinat, Virgie Dad, MD (Inactive) as Consulting Physician (Oncology) Kyung Rudd, MD as Consulting Physician (Radiation Oncology) Madelin Rear, MD (Inactive) as Consulting Physician (Endocrinology) Madelon Lips, MD as Consulting Physician (Nephrology) Benay Pike, MD  CHIEF COMPLAINT: Estrogen receptor positive breast cancer  CURRENT TREATMENT: anastrozole   INTERVAL HISTORY:  [name] returns today for follow up of her estrogen receptor positive breast cancer.  She continues to be compliant with anastrozole, reports very intermittent hot flashes otherwise no issues. She had mammogram recently which was unremarkable, BI-RADS 2.  Her last bone density in September 2022 with normal bone density. Rest of the pertinent 10 point ROS reviewed and negative   COVID 19 VACCINATION STATUS: fully vaccinated [employer]), with booster 04/2020; infection 02/2021   HISTORY OF CURRENT ILLNESS: From the original intake note:  Tasha Tucker had routine screening mammography on 03/13/2020 showing a possible abnormality in the right breast. She underwent right diagnostic mammography with tomography and right breast ultrasonography at [facility] on 03/26/2020 showing: breast density category B; suspicious 1.9 cm mass/distortion in right breast at 9 o'clock; no right axillary lymphadenopathy.  Accordingly that same day, she proceeded to biopsy of the right breast area in question. The pathology from this procedure ([id number]) showed: invasive and in situ  mammary carcinoma, grade 2, e-cadherin negative. Prognostic indicators significant for: estrogen receptor, 90% positive and progesterone receptor, 90% positive, both with strong staining intensity. Proliferation marker Ki67 at 5%. HER2 negative by immunohistochemistry (1+).  The patient's subsequent history is as detailed below.   PAST MEDICAL HISTORY: Past Medical History:  Diagnosis Date   Bladder cancer ([place])    UROLOGIST- [facility]   Breast cancer ([place]) 2021   right lumpectomy and chemo and radiation done   DJD (degenerative joint disease)    NECK, NEEDS PILLOW WHEN LYING FLAT IS POSSIBLE   History of cerebral aneurysm repair    1996-- S/P CLAPPING RIGHT CAROTID --- NO DEFICITS   History of renal pelvis cancer    10-09-2016  S/P  LEFT RADICAL NEPHROURETERECTOMY -- HIGH GRADE PAPILLARY UROTHELIAL CARCINOMA   Hyperlipidemia    Hypertension    Liver hemangioma    yrs ago per pt on 03-19-2021   OA (osteoarthritis)    PONV (postoperative nausea and vomiting)    Type 2 diabetes mellitus ([place])    UTI (urinary tract infection)    finished antibiotics 03-01-2021 all symptoms resolved   Wears glasses 03/19/2021    PAST SURGICAL HISTORY: Past Surgical History:  Procedure Laterality Date   ABDOMINAL HYSTERECTOMY  1990   PARTIAL   BREAST LUMPECTOMY WITH RADIOACTIVE SEED AND SENTINEL LYMPH NODE BIOPSY Right 04/17/2020   Procedure: BREAST LUMPECTOMY X 2  WITH RADIOACTIVE SEED AND SENTINEL LYMPH NODE BIOPSY;  Surgeon: Coralie Keens, MD;  Location: [facility];  Service: General;  Laterality: Right;   CATARACT EXTRACTION W/ INTRAOCULAR LENS  IMPLANT, BILATERAL  2016; 2017   CEREBRAL ANEURYSM REPAIR  1996   clipped-no deficits RIGHT CAROTID   CHOLECYSTECTOMY  2010   laparoscopic   CYSTOSCOPY W/ RETROGRADES Right 06/09/2017   Procedure: CYSTOSCOPY  WITH RETROGRADE PYELOGRAM;  Surgeon: Alexis Frock, MD;  Location: [facility];  Service: Urology;  Laterality: Right;   CYSTOSCOPY  W/ RETROGRADES Right 07/21/2017   Procedure: CYSTOSCOPY WITH RETROGRADE PYELOGRAM;  Surgeon: Alexis Frock, MD;  Location[facility];  Service: Urology;  Laterality: Right;   CYSTOSCOPY WITH RETROGRADE PYELOGRAM, URETEROSCOPY AND STENT PLACEMENT Left 08/26/2016   Procedure: CYSTOSCOPY WITH BILATERAL RETROGRADE PYELOGRAM,LEFT URETEROSCOPY WITH BIOPSY AND LEFT STENT PLACEMENT;  Surgeon: Alexis Frock, MD;  Location: [facility];  Service: Urology;  Laterality: Left;   PORT-A-CATH REMOVAL Left 03/24/2021   Procedure: PORT-A-CATH REMOVAL;  Surgeon: Coralie Keens, MD;  Location: [facility];  Service: General;  Laterality: Left;   PORTACATH PLACEMENT Left 06/18/2020   Procedure: INSERTION PORT-A-CATH WITH ULTRASOUND GUIDANCE;  Surgeon: Coralie Keens, MD;  Location: [facility];  Service: General;  Laterality: Left;   ROBOT ASSITED LAPAROSCOPIC NEPHROURETERECTOMY Left 10/09/2016   Procedure: XI ROBOT ASSITED LAPAROSCOPIC NEPHROURETERECTOMY;  Surgeon: Alexis Frock, MD;  Location: [facility];  Service: Urology;  Laterality: Left;   SURGERY ON FOOT FOR STAPH INFECTION  1989   TRANSURETHRAL RESECTION OF BLADDER TUMOR N/A 07/21/2017   Procedure: TRANSURETHRAL RESECTION OF BLADDER TUMOR (TURBT);  Surgeon: Alexis Frock, MD;  Location: [facility];  Service: Urology;  Laterality: N/A;   TRANSURETHRAL RESECTION OF BLADDER TUMOR WITH MITOMYCIN-C N/A 06/09/2017   Procedure: TRANSURETHRAL RESECTION OF BLADDER TUMOR WITH MITOMYCIN-C;  Surgeon: Alexis Frock, MD;  Location: [facility];  Service: Urology;  Laterality: N/A;   TUBAL LIGATION  1980    FAMILY HISTORY: Family History  Problem Relation Age of Onset   Cancer Mother    Hyperlipidemia Father    Heart attack Father    Diabetes Brother    Breast cancer Neg Hx    Her father died at age 49 from ASCVD. Her mother died at age 60. She reports her mother had a tumor in her  thigh at age 64, possibly a sarcoma. The patient has one brother, with no history of cancer. Her son, [name], had brain cancer (oligodedroglioma, possibly) and is doing well now at age 79 (as of 03/2020).   GYNECOLOGIC HISTORY:  No LMP recorded. Patient has had a hysterectomy. Menarche: 71 years old Age at first live birth: 71 years old [place] P 3 LMP 1990 Contraceptive: used pills for 8 years without issues HRT never used  Hysterectomy? Yes, 1990 BSO? 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 Husband Liliane Channel is a retired Garment/textile technologist for the [employer]. She lives at home with husband Liliane Channel and their cat, Bradford. Daughter Bryson Ha, age 39, works as a Careers adviser in [place]. Son [name], age 22, works in Human resources officer in [place]. Son Ruby Cola, age 65, lives in [place] and was previously a paramedic. Kristene has 4 grandchildren. She attends a [employer].     ADVANCED DIRECTIVES: In the absence of any documentation to the contrary, the patient's spouse is their HCPOA.    HEALTH MAINTENANCE: Social History   Tobacco Use   Smoking status: Never   Smokeless tobacco: Never  Vaping Use   Vaping Use: Never used  Substance Use Topics   Alcohol use: Not Currently    Comment: , STOPPED   Drug use: No     Colonoscopy: Cologuard 2020 was negative  PAP: date unknown, s/p hysterectomy  Bone density: 05/2021, +1.7   Allergies  Allergen Reactions   Other Other (See Comments)   Adhesive [Tape] Rash    PAPER TAPE ONLY to use   Chlorhexidine Gluconate Itching and Rash    Topical antiseptic (CHG WIPES)    Dilantin [Phenytoin] Rash   Penicillins Rash    Has patient had a PCN reaction causing immediate rash, facial/tongue/throat swelling, SOB or lightheadedness with hypotension:unsure Has patient had a PCN reaction causing severe rash involving mucus membranes or skin necrosis:unsure Has patient had a PCN  reaction that required hospitalization:No Has patient had a PCN reaction occurring within the last 10 years:No If all of the above answers are "NO", then may proceed with Cephalosporin use.    Sulfa Antibiotics Rash    Current Outpatient Medications  Medication Sig Dispense Refill   acetaminophen (TYLENOL) 500 MG tablet Take 500 mg by mouth at bedtime.      anastrozole (ARIMIDEX) 1 MG tablet Take 1 tablet (1 mg total) by mouth daily. 90 tablet 4   BD PEN NEEDLE NANO U/F 32G X 4 MM MISC USE FOR INSULIN ADMINISTRATION DAILY 100 each 3   Biotin 10 MG CAPS Take by mouth 2 (two) times daily. 5000 mcg bid per pt 30 capsule    carvedilol (COREG) 3.125 MG tablet 1 tablet     cetirizine (ZYRTEC) 10 MG tablet Take 10 mg by mouth every morning.     HUMALOG KWIKPEN 100 UNIT/ML KwikPen Inject 7 Units into the skin 3 (three) times daily. Usually takes 6 units     insulin glargine (LANTUS SOLOSTAR) 100 UNIT/ML Solostar Pen Inject 26 Units into the skin every evening.     levothyroxine (SYNTHROID) 50 MCG tablet Take 1 tablet (50 mcg total) by mouth daily. 90 tablet 3   ONETOUCH DELICA LANCETS 38H MISC Use lancet to stick finger and check blood sugar two times daily. 100 each 6   ONETOUCH VERIO test strip USE AS DIRECTED. 300 each 2   OVER THE COUNTER MEDICATION Vitamin d 3 50 mcg daily     pravastatin (PRAVACHOL) 40 MG tablet TAKE 1 TABLET BY MOUTH EVERY DAY 90 tablet 3   No current facility-administered medications for this visit.    OBJECTIVE: White woman in no acute distress  There were no vitals filed for this visit.    There is no height or weight on file to calculate BMI.   Wt Readings from Last 3 Encounters:  05/04/22 229 lb 12.8 oz (104.2 kg)  04/13/22 231 lb 2 oz (104.8 kg)  10/20/21 233 lb 2 oz (105.7 kg)      ECOG FS:1 - Symptomatic but completely ambulatory  Physical Exam Constitutional:      Appearance: Normal appearance.  Chest:     Comments: Bilateral breasts inspected.  No  palpable masses or regional adenopathy.  Surgical distortion noted in the right breast Musculoskeletal:     Cervical back: Normal range of motion and neck supple. No rigidity.  Lymphadenopathy:     Cervical: No cervical adenopathy.  Neurological:     Mental Status: She is alert.       LAB RESULTS:  CMP     Component Value Date/Time   NA 140 05/04/2022 0923   NA 135 (A) 02/04/2022 0000   K 4.4 05/04/2022 0923   CL 104 05/04/2022 0923   CO2 23 05/04/2022 0923   GLUCOSE 171 (H) 05/04/2022 0923   BUN 31 (H) 05/04/2022 0923   BUN 31 (A) 02/04/2022 0000   CREATININE 1.80 (H) 05/04/2022 8299  CREATININE 1.78 (H) 06/19/2020 0842   CALCIUM 9.8 05/04/2022 0923   PROT 6.7 05/04/2022 0923   ALBUMIN 3.9 05/04/2022 0923   AST 25 05/04/2022 0923   AST 24 06/19/2020 0842   ALT 28 05/04/2022 0923   ALT 30 06/19/2020 0842   ALKPHOS 55 05/04/2022 0923   BILITOT 0.6 05/04/2022 0923   BILITOT 0.5 06/19/2020 0842   GFRNONAA 28 (L) 06/16/2021 1029   GFRNONAA 28 (L) 06/19/2020 0842   GFRAA 34 (L) 06/26/2020 1535   GFRAA 33 (L) 06/19/2020 0842    No results found for: "TOTALPROTELP", "ALBUMINELP", "A1GS", "A2GS", "BETS", "BETA2SER", "GAMS", "MSPIKE", "SPEI"  Lab Results  Component Value Date   WBC 5.5 06/16/2021   NEUTROABS 3.9 06/16/2021   HGB 14.2 06/16/2021   HCT 40.7 06/16/2021   MCV 89.1 06/16/2021   PLT 132 (L) 06/16/2021    No results found for: "LABCA2"  No components found for: "BWGYKZ993"  No results for input(s): "INR" in the last 168 hours.  No results found for: "LABCA2"  No results found for: "TTS177"  No results found for: "CAN125"  No results found for: "CAN153"  No results found for: "CA2729"  No components found for: "HGQUANT"  No results found for: "CEA1", "CEA" / No results found for: "CEA1", "CEA"   No results found for: "AFPTUMOR"  No results found for: "CHROMOGRNA"  No results found for: "KPAFRELGTCHN", "LAMBDASER",  "KAPLAMBRATIO" (kappa/lambda light chains)  No results found for: "HGBA", "HGBA2QUANT", "HGBFQUANT", "HGBSQUAN" (Hemoglobinopathy evaluation)   No results found for: "LDH"  No results found for: "IRON", "TIBC", "IRONPCTSAT" (Iron and TIBC)  No results found for: "FERRITIN"  Urinalysis    Component Value Date/Time   COLORURINE YELLOW 08/06/2020 1414   APPEARANCEUR HAZY (A) 08/06/2020 1414   LABSPEC 1.009 08/06/2020 1414   PHURINE 6.0 08/06/2020 1414   GLUCOSEU NEGATIVE 08/06/2020 1414   HGBUR SMALL (A) 08/06/2020 1414   BILIRUBINUR negative 06/13/2021 0959   BILIRUBINUR negative 02/25/2021 1511   KETONESUR negative 06/13/2021 0959   KETONESUR NEGATIVE 08/06/2020 1414   PROTEINUR negative 06/13/2021 0959   PROTEINUR Negative 02/25/2021 1511   PROTEINUR NEGATIVE 08/06/2020 1414   UROBILINOGEN 0.2 06/13/2021 0959   UROBILINOGEN 1.0 04/29/2013 1455   NITRITE Negative 06/13/2021 0959   NITRITE negative 02/25/2021 1511   NITRITE NEGATIVE 08/06/2020 1414   LEUKOCYTESUR Small (1+) (A) 06/13/2021 0959   LEUKOCYTESUR LARGE (A) 08/06/2020 1414    STUDIES: DG Chest 2 View  Result Date: 05/25/2022 CLINICAL DATA:  Neoplasm of left kidney EXAM: CHEST - 2 VIEW COMPARISON:  04/04/2021 FINDINGS: The heart size and mediastinal contours are within normal limits. Both lungs are clear. There is possible 2 mm calcified granuloma in left lower lung field. The visualized skeletal structures are unremarkable. Surgical clips are seen in right chest wall. IMPRESSION: No active cardiopulmonary disease. Electronically Signed   By: Elmer Picker M.D.   On: 05/25/2022 15:24     ELIGIBLE FOR AVAILABLE RESEARCH PROTOCOL: AET  ASSESSMENT: 71 y.o. [place] woman status post right breast upper outer quadrant biopsy 03/26/2020, for a clinical T1c N0, stage IA invasive lobular carcinoma, E-cadherin negative, grade 2, estrogen and progesterone receptor positive, with an MIB-1 of 5% and no  HER-2 amplification  (a) unable to obtain breast MRI due to renal concern with contrast status post nephrectomy  (1) status post right lumpectomy and sentinel lymph node sampling 04/17/2020 for a pT2 pN0, stage IB invasive lobular carcinoma, grade 2, with negative  margins  (a) a single right axillary lymph node was removed.  (2) Oncotype score of 29 predicts a risk of recurrence outside the breast within the next 9 years of 18% with antiestrogens only, and also a greater than 15% benefit from chemotherapy.  (3) chemotherapy consisting of cyclophosphamide, methotrexate and fluorouracil given every 21 days x 8, started 06/19/2020, completed 11/18/2020  (a) Udenyca added a day 3 beginning with cycle 2 secondary to treatment delays due to neutropenia  (4) adjuvant radiation 12/11/2020 through 01/08/2021 Site Technique Total Dose (Gy) Dose per Fx (Gy) Completed Fx Beam Energies  Breast, Right: Breast_Rt 3D 42.56/42.56 2.66 16/16 6X, 10X  Breast, Right: Breast_Rt_Bst specialPort 8/8 2 4/4 15E   (5) anastrozole started 04/03/2020 with anticipation of surgical delays  (a) bone density 03/30/2018 shows a T score of +1.5 (normal)  (B) bone density 05/29/2021 shows a T-score of +1.7   PLAN:  Ms Goldy is now here for follow up on anastrozole. She is tolerating this very well.  She will continue on this until July 2026 Physical examination today with no concerning masses.  Most recent mammogram reviewed and no concerning findings.  Last bone density in September 2022, normal.  Anticipate repeating bone density in September 2024 She will continue to do self breast exam monthly and report any changes to me.  Otherwise she will return to clinic in 1 year.  Total encounter time 30 minutes.*  *Total Encounter Time as defined by the Centers for Medicare and Medicaid Services includes, in addition to the face-to-face time of a patient visit (documented in the note above) non-face-to-face time: obtaining and  reviewing outside history, ordering and reviewing medications, tests or procedures, care coordination (communications with other health care professionals or caregivers) and documentation in the medical record.

## 2022-07-03 ENCOUNTER — Telehealth: Payer: Self-pay | Admitting: Family Medicine

## 2022-07-03 NOTE — Telephone Encounter (Signed)
Left message for patient to call back and schedule Medicare Annual Wellness Visit (AWV).   Please offer to do virtually or by telephone.  Left office number and my jabber [phone].  Last AWV:05/07/2021  Please schedule at anytime with Nurse Health Advisor.

## 2022-07-04 ENCOUNTER — Other Ambulatory Visit: Payer: Self-pay | Admitting: Family Medicine

## 2022-07-04 DIAGNOSIS — E039 Hypothyroidism, unspecified: Secondary | ICD-10-CM

## 2022-07-12 ENCOUNTER — Encounter: Payer: Self-pay | Admitting: Family Medicine

## 2022-08-18 ENCOUNTER — Other Ambulatory Visit: Payer: Self-pay | Admitting: Family Medicine

## 2022-08-18 DIAGNOSIS — E782 Mixed hyperlipidemia: Secondary | ICD-10-CM

## 2022-09-08 ENCOUNTER — Encounter: Payer: Self-pay | Admitting: Hematology and Oncology

## 2022-09-09 ENCOUNTER — Other Ambulatory Visit: Payer: Self-pay | Admitting: *Deleted

## 2022-09-09 DIAGNOSIS — C50411 Malignant neoplasm of upper-outer quadrant of right female breast: Secondary | ICD-10-CM

## 2022-09-09 MED ORDER — ANASTROZOLE 1 MG PO TABS: 1.0000 mg | ORAL_TABLET | Freq: Every day | ORAL | 4 refills | Status: DC

## 2022-09-09 NOTE — Telephone Encounter (Signed)
Patient sent refill request via MyChart for Anastrozole.  Per Dr. Rob Hickman OV note on 06/17/22 - She is tolerating Anastrozole well and will continue on Anastrozole until July 2026  Refill escribed to [facility] [place], [place]

## 2022-10-29 NOTE — Progress Notes (Signed)
[place] at [facility] [address], [place], [place] [address] [phone]  Date:  11/02/2022   Name:  Tasha Tucker   DOB:  [birth year]   MRN:  [id number]  PCP:  Darreld Mclean, MD    Chief Complaint: Hypothyroidism, Hypertension, Diabetes, and Follow-up   History of Present Illness:  Tasha Tucker is a 72 y.o. very pleasant female patient who presents with the following:  Patient seen today for periodic follow-up Most recent visit with myself was in August for her physical  History of breast cancer diagnosed 2021-her oncologist is Dr. Jana Hakim. She was treated with definitive surgical management, completed radiation and is currently taking anastrozole-we will use this for 5 years.  She may get some hot flashes but nothing more    Also with history of diabetes, hyperlipidemia, stage IV chronic kidney disease, renal and bladder cancer, cerebral aneurysm status post repair, hypertension She had a left nephrectomy in 2018, brain aneurysm clipping in 1990 Nephrologist- Hollie Salk.  Last visit in December  Urologist is Dr. Tresa Moore-  She does an annual recheck.  Most recent visit was in September Dr. Tresa Moore is following her for history of renal pelvis and bladder cancer.  Status post left robotic nephrectomy no ureterectomy and periaortic node dissection in January 2018 Endocrinologist is Dr. Rosalee Kaufman Dr Avermini moved away so she is now seeing a NP with [facility]- Eilene Ghazi   At her last visit renal function was stable, GFR just under 30  Hemoglobin A1c as per endocrinology, patient reports her most recent A1c was 7.2% in November  She is using lantus and humalog, she has a Risk analyst and this is really helping her manage her glucose COVID-19 booster recommended if not done already Diabetic foot exam can be updated Mammogram, Cologuard up-to-date DEXA scan 9/22-normal  We can check her TSH today Lab Results  Component Value Date    TSH 2.44 05/04/2022    Patient Active Problem List   Diagnosis Date Noted   Hypothyroidism 04/30/2021   Thrombocytopenia ([place]) 02/17/2021   Port-A-Cath in place 07/11/2020   Malignant neoplasm of upper-outer quadrant of right breast in female, estrogen receptor positive ([place]) 03/28/2020   Controlled type 2 diabetes mellitus with complication, with long-term current use of insulin ([place]) 03/19/2018   Hyperlipidemia 01/12/2018   Cerebral aneurysm 12/29/2017   Secondary hypertension, unspecified 12/29/2017   CKD (chronic kidney disease) stage 4, GFR 15-29 ml/min ([place]) 12/29/2017   Cancer of renal pelvis, left ([place]) 10/12/2016   Renal mass 10/09/2016    Past Medical History:  Diagnosis Date   Bladder cancer ([place])    UROLOGIST- DR [facility]   Breast cancer ([place]) 2021   right lumpectomy and chemo and radiation done   DJD (degenerative joint disease)    NECK, NEEDS PILLOW WHEN LYING FLAT IS POSSIBLE   History of cerebral aneurysm repair    1996-- S/P CLAPPING RIGHT CAROTID --- NO DEFICITS   History of renal pelvis cancer    10-09-2016  S/P  LEFT RADICAL NEPHROURETERECTOMY -- HIGH GRADE PAPILLARY UROTHELIAL CARCINOMA   Hyperlipidemia    Hypertension    Liver hemangioma    yrs ago per pt on 03-19-2021   OA (osteoarthritis)    PONV (postoperative nausea and vomiting)    Type 2 diabetes mellitus ([place])    UTI (urinary tract infection)    finished antibiotics 03-01-2021 all symptoms resolved   Wears glasses 03/19/2021    Past  Surgical History:  Procedure Laterality Date   ABDOMINAL HYSTERECTOMY  1990   PARTIAL   BREAST LUMPECTOMY WITH RADIOACTIVE SEED AND SENTINEL LYMPH NODE BIOPSY Right 04/17/2020   Procedure: BREAST LUMPECTOMY X 2  WITH RADIOACTIVE SEED AND SENTINEL LYMPH NODE BIOPSY;  Surgeon: Coralie Keens, MD;  Location: [place];  Service: General;  Laterality: Right;   CATARACT EXTRACTION W/ INTRAOCULAR LENS  IMPLANT, BILATERAL  2016; 2017   CEREBRAL ANEURYSM REPAIR  1996    clipped-no deficits RIGHT CAROTID   CHOLECYSTECTOMY  2010   laparoscopic   CYSTOSCOPY W/ RETROGRADES Right 06/09/2017   Procedure: CYSTOSCOPY WITH RETROGRADE PYELOGRAM;  Surgeon: Alexis Frock, MD;  [facility];  Service: Urology;  Laterality: Right;   CYSTOSCOPY W/ RETROGRADES Right 07/21/2017   Procedure: CYSTOSCOPY WITH RETROGRADE PYELOGRAM;  Surgeon: Alexis Frock, MD;  [facility];  Service: Urology;  Laterality: Right;   CYSTOSCOPY WITH RETROGRADE PYELOGRAM, URETEROSCOPY AND STENT PLACEMENT Left 08/26/2016   Procedure: CYSTOSCOPY WITH BILATERAL RETROGRADE PYELOGRAM,LEFT URETEROSCOPY WITH BIOPSY AND LEFT STENT PLACEMENT;  Surgeon: Alexis Frock, MD;  Location: [facility];  Service: Urology;  Laterality: Left;   PORT-A-CATH REMOVAL Left 03/24/2021   Procedure: PORT-A-CATH REMOVAL;  Surgeon: Coralie Keens, MD;  Location[facility];  Service: General;  Laterality: Left;   PORTACATH PLACEMENT Left 06/18/2020   Procedure: INSERTION PORT-A-CATH WITH ULTRASOUND GUIDANCE;  Surgeon: Coralie Keens, MD;  Location: [place];  Service: General;  Laterality: Left;   ROBOT ASSITED LAPAROSCOPIC NEPHROURETERECTOMY Left 10/09/2016   Procedure: XI ROBOT ASSITED LAPAROSCOPIC NEPHROURETERECTOMY;  Surgeon: Alexis Frock, MD;  Location: [facility];  Service: Urology;  Laterality: Left;   SURGERY ON FOOT FOR STAPH INFECTION  1989   TRANSURETHRAL RESECTION OF BLADDER TUMOR N/A 07/21/2017   Procedure: TRANSURETHRAL RESECTION OF BLADDER TUMOR (TURBT);  Surgeon: Alexis Frock, MD;  Location[facility];  Service: Urology;  Laterality: N/A;   TRANSURETHRAL RESECTION OF BLADDER TUMOR WITH MITOMYCIN-C N/A 06/09/2017   Procedure: TRANSURETHRAL RESECTION OF BLADDER TUMOR WITH MITOMYCIN-C;  Surgeon: Alexis Frock, MD;  Location: [facility];  Service: Urology;  Laterality: N/A;   TUBAL LIGATION  1980     Social History   Tobacco Use   Smoking status: Never   Smokeless tobacco: Never  Vaping Use   Vaping Use: Never used  Substance Use Topics   Alcohol use: Not Currently    Comment: , STOPPED   Drug use: No    Family History  Problem Relation Age of Onset   Cancer Mother    Hyperlipidemia Father    Heart attack Father    Diabetes Brother    Breast cancer Neg Hx     Allergies  Allergen Reactions   Other Other (See Comments)   Adhesive [Tape] Rash    PAPER TAPE ONLY to use   Chlorhexidine Gluconate Itching and Rash    Topical antiseptic (CHG WIPES)    Dilantin [Phenytoin] Rash   Penicillins Rash    Has patient had a PCN reaction causing immediate rash, facial/tongue/throat swelling, SOB or lightheadedness with hypotension:unsure Has patient had a PCN reaction causing severe rash involving mucus membranes or skin necrosis:unsure Has patient had a PCN reaction that required hospitalization:No Has patient had a PCN reaction occurring within the last 10 years:No If all of the above answers are "NO", then may proceed with Cephalosporin use.    Sulfa Antibiotics Rash    Medication list has been reviewed  and updated.  Current Outpatient Medications on File Prior to Visit  Medication Sig Dispense Refill   acetaminophen (TYLENOL) 500 MG tablet Take 500 mg by mouth at bedtime.      anastrozole (ARIMIDEX) 1 MG tablet Take 1 tablet (1 mg total) by mouth daily. 90 tablet 4   BD PEN NEEDLE NANO U/F 32G X 4 MM MISC USE FOR INSULIN ADMINISTRATION DAILY 100 each 3   Biotin 10 MG CAPS Take by mouth daily. 5000 mcg bid per pt 30 capsule    carvedilol (COREG) 3.125 MG tablet in the morning and at bedtime.     cetirizine (ZYRTEC) 10 MG tablet Take 10 mg by mouth every morning.     Continuous Blood Gluc Sensor (FREESTYLE LIBRE 14 DAY SENSOR) MISC by Does not apply route.     HUMALOG KWIKPEN 100 UNIT/ML KwikPen Inject 7 Units into the skin 3 (three) times daily. Usually takes 6 units      insulin glargine (LANTUS SOLOSTAR) 100 UNIT/ML Solostar Pen Inject 26 Units into the skin every evening.     levothyroxine (SYNTHROID) 50 MCG tablet Take 1 tablet (50 mcg total) by mouth daily before breakfast. 90 tablet 1   OVER THE COUNTER MEDICATION Vitamin d 3 50 mcg daily     pravastatin (PRAVACHOL) 40 MG tablet Take 1 tablet (40 mg total) by mouth daily. 90 tablet 1   No current facility-administered medications on file prior to visit.    Review of Systems:  As per HPI- otherwise negative.   Physical Examination: Vitals:   11/02/22 0850  BP: 122/60  Pulse: 73  Resp: 18  Temp: 98.1 F (36.7 C)  SpO2: 96%   Vitals:   11/02/22 0850  Weight: 234 lb (106.1 kg)  Height: '5\' 8"'$  (1.727 m)   Body mass index is 35.58 kg/m. Ideal Body Weight: Weight in (lb) to have BMI = 25: 164.1  GEN: no acute distress.  Obese, looks well HEENT: Atraumatic, Normocephalic.  Ears and Nose: No external deformity. CV: Noted mildly irregular pulse rate today, will obtain EKG no M/G/R. No JVD. No thrill. No extra heart sounds. PULM: CTA B, no wheezes, crackles, rhonchi. No retractions. No resp. distress. No accessory muscle use. ABD: S, NT, ND, +BS. No rebound. No HSM. EXTR: No c/c/e PSYCH: Normally interactive. Conversant.  Foot exam is normal except for grossly hypertrophic, thickened right great toenail most likely consistent with onychomycosis  EKG: SR with rate variation, no abnormality  Assessment and Plan: Hypothyroidism (acquired) - Plan: TSH  Mixed hyperlipidemia - Plan: Lipid panel, CT CARDIAC SCORING (SELF PAY ONLY)  Controlled type 2 diabetes mellitus with complication, with long-term current use of insulin (HCC) - Plan: CT CARDIAC SCORING (SELF PAY ONLY)  CKD (chronic kidney disease) stage 4, GFR 15-29 ml/min (HCC) - Plan: CBC  Malignant neoplasm of upper-outer quadrant of right breast in female, estrogen receptor positive (HCC)  Irregular heart beat - Plan: EKG  12-Lead  Following up today, labs are pending as above.  We discussed her toenail, for the time being she would prefer to just leave alone.  She does not wish to pursue any other treatment right now As above, EKG reveals normal sinus rhythm Ordered coronary calcium to aid in risk stratification for coronary disease Signed Lamar Blinks, MD  Results for orders placed or performed in visit on 11/02/22  TSH  Result Value Ref Range   TSH 1.72 0.35 - 5.50 uIU/mL  CBC  Result Value Ref Range  WBC 4.8 4.0 - 10.5 K/uL   RBC 4.75 3.87 - 5.11 Mil/uL   Platelets 137.0 (L) 150.0 - 400.0 K/uL   Hemoglobin 14.8 12.0 - 15.0 g/dL   HCT 43.2 36.0 - 46.0 %   MCV 90.8 78.0 - 100.0 fl   MCHC 34.3 30.0 - 36.0 g/dL   RDW 14.0 11.5 - 15.5 %  Lipid panel  Result Value Ref Range   Cholesterol 152 0 - 200 mg/dL   Triglycerides 191.0 (H) 0.0 - 149.0 mg/dL   HDL 52.60 >39.00 mg/dL   VLDL 38.2 0.0 - 40.0 mg/dL   LDL Cholesterol 61 0 - 99 mg/dL   Total CHOL/HDL Ratio 3    NonHDL 99.26

## 2022-10-29 NOTE — Patient Instructions (Addendum)
It was very nice to see you again today Consider COVID-19 booster if not up-to-date Assuming all is well lets check back in about 6 months I ordered your coronary calcium today as well as labs- will be in touch with these results

## 2022-11-02 ENCOUNTER — Encounter: Payer: Self-pay | Admitting: Family Medicine

## 2022-11-02 ENCOUNTER — Ambulatory Visit (INDEPENDENT_AMBULATORY_CARE_PROVIDER_SITE_OTHER): Payer: Medicare Other | Admitting: Family Medicine

## 2022-11-02 VITALS — BP 122/60 | HR 73 | Temp 98.1°F | Resp 18 | Ht 68.0 in | Wt 234.0 lb

## 2022-11-02 DIAGNOSIS — N184 Chronic kidney disease, stage 4 (severe): Secondary | ICD-10-CM

## 2022-11-02 DIAGNOSIS — I499 Cardiac arrhythmia, unspecified: Secondary | ICD-10-CM

## 2022-11-02 DIAGNOSIS — E782 Mixed hyperlipidemia: Secondary | ICD-10-CM | POA: Diagnosis not present

## 2022-11-02 DIAGNOSIS — C50411 Malignant neoplasm of upper-outer quadrant of right female breast: Secondary | ICD-10-CM

## 2022-11-02 DIAGNOSIS — Z170 Estrogen receptor positive status [ER+]: Secondary | ICD-10-CM

## 2022-11-02 DIAGNOSIS — E039 Hypothyroidism, unspecified: Secondary | ICD-10-CM

## 2022-11-02 DIAGNOSIS — E118 Type 2 diabetes mellitus with unspecified complications: Secondary | ICD-10-CM | POA: Diagnosis not present

## 2022-11-02 DIAGNOSIS — Z794 Long term (current) use of insulin: Secondary | ICD-10-CM

## 2022-11-02 LAB — LIPID PANEL
Cholesterol: 152 mg/dL (ref 0–200)
HDL: 52.6 mg/dL (ref >39.00)
LDL Cholesterol: 61 mg/dL (ref 0–99)
NonHDL: 99.26
Total CHOL/HDL Ratio: 3
Triglycerides: 191 mg/dL — ABNORMAL HIGH (ref 0.0–149.0)
VLDL: 38.2 mg/dL (ref 0.0–40.0)

## 2022-11-02 LAB — CBC
HCT: 43.2 % (ref 36.0–46.0)
Hemoglobin: 14.8 g/dL (ref 12.0–15.0)
MCHC: 34.3 g/dL (ref 30.0–36.0)
MCV: 90.8 fl (ref 78.0–100.0)
Platelets: 137 10*3/uL — ABNORMAL LOW (ref 150.0–400.0)
RBC: 4.75 Mil/uL (ref 3.87–5.11)
RDW: 14 % (ref 11.5–15.5)
WBC: 4.8 10*3/uL (ref 4.0–10.5)

## 2022-11-02 LAB — TSH: TSH: 1.72 u[IU]/mL (ref 0.35–5.50)

## 2022-11-03 ENCOUNTER — Encounter: Payer: Self-pay | Admitting: Family Medicine

## 2022-11-05 ENCOUNTER — Ambulatory Visit (HOSPITAL_BASED_OUTPATIENT_CLINIC_OR_DEPARTMENT_OTHER)
Admission: RE | Admit: 2022-11-05 | Discharge: 2022-11-05 | Disposition: A | Discharge status: Home / Self Care | Payer: Self-pay | Source: Ambulatory Visit | Attending: Family Medicine | Admitting: Family Medicine

## 2022-11-05 DIAGNOSIS — Z794 Long term (current) use of insulin: Secondary | ICD-10-CM | POA: Insufficient documentation

## 2022-11-05 DIAGNOSIS — E782 Mixed hyperlipidemia: Secondary | ICD-10-CM | POA: Insufficient documentation

## 2022-11-05 DIAGNOSIS — E118 Type 2 diabetes mellitus with unspecified complications: Secondary | ICD-10-CM | POA: Insufficient documentation

## 2022-11-09 ENCOUNTER — Encounter: Payer: Self-pay | Admitting: Family Medicine

## 2022-11-10 ENCOUNTER — Encounter: Payer: Self-pay | Admitting: Family Medicine

## 2022-11-10 DIAGNOSIS — R931 Abnormal findings on diagnostic imaging of heart and coronary circulation: Secondary | ICD-10-CM

## 2022-11-17 ENCOUNTER — Encounter: Payer: Self-pay | Admitting: *Deleted

## 2022-11-17 NOTE — Congregational Nurse Program (Signed)
LATE ENTERY FOR OI:[id number]- Patient with positive Cardiac Index test.  Patient had the test results and shared them.  Positive study for cardiac changes and patient urged to seek out referral to cardiac specialist to have test further and more deeply read.  Is not having any symptoms of chest tightness or chest pain.  No back in her back or down either arm.  Hads a normal bp in the md office on [id number]. Todays note followup  [facility]  Asked if she has found a cardiologist yet.  Yes she has an next appointment is March 25,2024.  Will go to [facility] cardiac clinic.  Patient denies any cardiac symptoms.

## 2022-11-25 ENCOUNTER — Encounter: Payer: Self-pay | Admitting: *Deleted

## 2022-11-25 NOTE — Congregational Nurse Program (Signed)
Follow up visit by patient for positive cardiac index score.  Does have an appointment next week with a local cardiologist to review score and possible needed treatment plan. Patient asked about past hx of radiation and positive index.  Told her that there is relationship between there hx of breast/chest radiation and a positive index due to the effects that radiation has on the cardiac muscle. Patient does plan to keep appointment as scheduled and look into what her future needs if any might be.

## 2022-12-21 ENCOUNTER — Ambulatory Visit: Payer: Medicare Other | Admitting: Cardiology

## 2022-12-23 ENCOUNTER — Encounter: Payer: Self-pay | Admitting: Internal Medicine

## 2022-12-23 ENCOUNTER — Ambulatory Visit: Payer: Medicare Other | Attending: Cardiology | Admitting: Internal Medicine

## 2022-12-23 VITALS — BP 112/68 | HR 85 | Ht 68.0 in | Wt 234.6 lb

## 2022-12-23 DIAGNOSIS — R931 Abnormal findings on diagnostic imaging of heart and coronary circulation: Secondary | ICD-10-CM

## 2022-12-23 MED ORDER — ASPIRIN 81 MG PO TBEC: 81.0000 mg | DELAYED_RELEASE_TABLET | Freq: Every day | ORAL | 3 refills | Status: AC

## 2022-12-23 NOTE — Patient Instructions (Signed)
Medication Instructions:   Start Aspirin 81 MG once daily  *If you need a refill on your cardiac medications before your next appointment, please call your pharmacy*   Lab Work: None ordered    Testing/Procedures: Your physician has requested that you have an echocardiogram. Echocardiography is a painless test that uses sound waves to create images of your heart. It provides your doctor with information about the size and shape of your heart and how well your heart's chambers and valves are working. This procedure takes approximately one hour. There are no restrictions for this procedure.  Please do NOT wear cologne, perfume, aftershave, or lotions (deodorant is allowed). Please arrive 15 minutes prior to your appointment time.    Follow-Up: At [facility], you and your health needs are our priority.  As part of our continuing mission to provide you with exceptional heart care, we have created designated Provider Care Teams.  These Care Teams include your primary Cardiologist (physician) and Advanced Practice Providers (APPs -  Physician Assistants and Nurse Practitioners) who all work together to provide you with the care you need, when you need it.  We recommend signing up for the patient portal called "MyChart".  Sign up information is provided on this After Visit Summary.  MyChart is used to connect with patients for Virtual Visits (Telemedicine).  Patients are able to view lab/test results, encounter notes, upcoming appointments, etc.  Non-urgent messages can be sent to your provider as well.   To learn more about what you can do with MyChart, go to [email or web address].    Your next appointment:   6 month(s)  Provider:   Janina Mayo, MD

## 2022-12-23 NOTE — Progress Notes (Signed)
Cardiology Office Note:    Date:  12/23/2022   ID:  AYDIN SEFTON, DOB 06-25-1951, MRN [id number]  PCP:  Darreld Mclean, MD   [place] Providers Cardiologist:  None     Referring MD: Darreld Mclean, MD   No chief complaint on file. Elevated CAC  History of Present Illness:    Tasha Tucker is a 72 y.o. female with a hx of bladder CA/urothelial CA s/p nephrourectomy, breast CA diagnosed 2021 s/p RT and on anastrozole,  stage IV CKD, cerebral aneurysm s/p clipping 1996, hypothyroidism, HTN, DM2 referral for an elevated CAC.  Here total CAC is 586 which is 91st percentile. Predominantly in the RCA.   She underwent a CT scan and noted her primary heard something abnormal on her exam. She is asymptomatic. She denies chest pressure and SOB. She does some walking. No cardiology testing before. She takes pravastatin.  Brother is in heart failure, he is on dialysis and sees Dr. Aundra Dubin  Non Smoker  Past Medical History:  Diagnosis Date   Bladder cancer ([place])    UROLOGIST- DR [facility]   Breast cancer ([place]) 2021   right lumpectomy and chemo and radiation done   DJD (degenerative joint disease)    NECK, NEEDS PILLOW WHEN LYING FLAT IS POSSIBLE   History of cerebral aneurysm repair    1996-- S/P CLAPPING RIGHT CAROTID --- NO DEFICITS   History of renal pelvis cancer    10-09-2016  S/P  LEFT RADICAL NEPHROURETERECTOMY -- HIGH GRADE PAPILLARY UROTHELIAL CARCINOMA   Hyperlipidemia    Hypertension    Liver hemangioma    yrs ago per pt on 03-19-2021   OA (osteoarthritis)    PONV (postoperative nausea and vomiting)    Type 2 diabetes mellitus ([place])    UTI (urinary tract infection)    finished antibiotics 03-01-2021 all symptoms resolved   Wears glasses 03/19/2021    Past Surgical History:  Procedure Laterality Date   ABDOMINAL HYSTERECTOMY  1990   PARTIAL   BREAST LUMPECTOMY WITH RADIOACTIVE SEED AND SENTINEL LYMPH NODE BIOPSY Right 04/17/2020   Procedure: BREAST  LUMPECTOMY X 2  WITH RADIOACTIVE SEED AND SENTINEL LYMPH NODE BIOPSY;  Surgeon: Coralie Keens, MD;  Location: [place];  Service: General;  Laterality: Right;   CATARACT EXTRACTION W/ INTRAOCULAR LENS  IMPLANT, BILATERAL  2016; 2017   CEREBRAL ANEURYSM [place]   clipped-no deficits RIGHT CAROTID   CHOLECYSTECTOMY  2010   laparoscopic   CYSTOSCOPY W/ RETROGRADES Right 06/09/2017   Procedure: CYSTOSCOPY WITH RETROGRADE PYELOGRAM;  Surgeon: Alexis Frock, MD;  Location: [place];  Service: Urology;  Laterality: Right;   CYSTOSCOPY W/ RETROGRADES Right 07/21/2017   Procedure: CYSTOSCOPY WITH RETROGRADE PYELOGRAM;  Surgeon: Alexis Frock, MD;  Location[facility];  Service: Urology;  Laterality: Right;   CYSTOSCOPY WITH RETROGRADE PYELOGRAM, URETEROSCOPY AND STENT PLACEMENT Left 08/26/2016   Procedure: CYSTOSCOPY WITH BILATERAL RETROGRADE PYELOGRAM,LEFT URETEROSCOPY WITH BIOPSY AND LEFT STENT PLACEMENT;  Surgeon: Alexis Frock, MD;  Location[facility];  Service: Urology;  Laterality: Left;   PORT-A-CATH REMOVAL Left 03/24/2021   Procedure: PORT-A-CATH REMOVAL;  Surgeon: Coralie Keens, MD;  Location: [facility];  Service: General;  Laterality: Left;   PORTACATH PLACEMENT Left 06/18/2020   Procedure: INSERTION PORT-A-CATH WITH ULTRASOUND GUIDANCE;  Surgeon: Coralie Keens, MD;  Location: [place];  Service: General;  Laterality: Left;   ROBOT ASSITED LAPAROSCOPIC NEPHROURETERECTOMY Left 10/09/2016   Procedure:  XI ROBOT ASSITED LAPAROSCOPIC NEPHROURETERECTOMY;  Surgeon: Alexis Frock, MD;  [facility];  Service: Urology;  Laterality: Left;   SURGERY ON FOOT FOR STAPH INFECTION  1989   TRANSURETHRAL RESECTION OF BLADDER TUMOR N/A 07/21/2017   Procedure: TRANSURETHRAL RESECTION OF BLADDER TUMOR (TURBT);  Surgeon: Alexis Frock, MD;  Location: [facility];  Service: Urology;  Laterality: N/A;    TRANSURETHRAL RESECTION OF BLADDER TUMOR WITH MITOMYCIN-C N/A 06/09/2017   Procedure: TRANSURETHRAL RESECTION OF BLADDER TUMOR WITH MITOMYCIN-C;  Surgeon: Alexis Frock, MD;  Location: [facility];  Service: Urology;  Laterality: N/A;   TUBAL LIGATION  1980    Current Medications: Current Outpatient Medications on File Prior to Visit  Medication Sig Dispense Refill   acetaminophen (TYLENOL) 500 MG tablet Take 500 mg by mouth at bedtime.      anastrozole (ARIMIDEX) 1 MG tablet Take 1 tablet (1 mg total) by mouth daily. 90 tablet 4   BD PEN NEEDLE NANO U/F 32G X 4 MM MISC USE FOR INSULIN ADMINISTRATION DAILY 100 each 3   Biotin 10 MG CAPS Take by mouth daily. 5000 mcg bid per pt 30 capsule    carvedilol (COREG) 3.125 MG tablet in the morning and at bedtime.     cetirizine (ZYRTEC) 10 MG tablet Take 10 mg by mouth every morning.     Continuous Blood Gluc Sensor (FREESTYLE LIBRE 14 DAY SENSOR) MISC by Does not apply route.     HUMALOG KWIKPEN 100 UNIT/ML KwikPen Inject 7 Units into the skin 3 (three) times daily. Usually takes 6 units     insulin glargine (LANTUS SOLOSTAR) 100 UNIT/ML Solostar Pen Inject 26 Units into the skin every evening.     levothyroxine (SYNTHROID) 50 MCG tablet Take 1 tablet (50 mcg total) by mouth daily before breakfast. 90 tablet 1   OVER THE COUNTER MEDICATION Vitamin d 3 50 mcg daily     pravastatin (PRAVACHOL) 40 MG tablet Take 1 tablet (40 mg total) by mouth daily. 90 tablet 1   No current facility-administered medications on file prior to visit.     Allergies:   Other, Adhesive [tape], Chlorhexidine gluconate, Dilantin [phenytoin], Penicillins, and Sulfa antibiotics   Social History   Socioeconomic History   Marital status: Married    Spouse name: Not on file   Number of children: Not on file   Years of education: Not on file   Highest education level: Not on file  Occupational History   Not on file  Tobacco Use   Smoking status: Never    Smokeless tobacco: Never  Vaping Use   Vaping Use: Never used  Substance and Sexual Activity   Alcohol use: Not Currently    Comment: , STOPPED   Drug use: No   Sexual activity: Yes  Other Topics Concern   Not on file  Social History Narrative   Not on file   Social Determinants of Health   Financial Resource Strain: Low Risk  (05/07/2021)   Overall Financial Resource Strain (CARDIA)    Difficulty of Paying Living Expenses: Not hard at all  Food Insecurity: No Food Insecurity (05/07/2021)   Hunger Vital Sign    Worried About Running Out of Food in the Last Year: Never true    [place] in the Last Year: Never true  Transportation Needs: No Transportation Needs (05/07/2021)   PRAPARE - Hydrologist (Medical): No    Lack of Transportation (Non-Medical):  No  Physical Activity: Inactive (05/07/2021)   Exercise Vital Sign    Days of Exercise per Week: 0 days    Minutes of Exercise per Session: 0 min  Stress: No Stress Concern Present (05/07/2021)   [place]    Feeling of Stress : Not at all  Social Connections: Moderately Integrated (05/07/2021)   Social Connection and Isolation Panel [NHANES]    Frequency of Communication with Friends and Family: More than three times a week    Frequency of Social Gatherings with Friends and Family: More than three times a week    Attends Religious Services: More than 4 times per year    Active Member of [employer] or Organizations: No    Attends Music therapist: Never    Marital Status: Married     Family History: The patient's family history includes Cancer in her mother; Diabetes in her brother; Heart attack in her father; Hyperlipidemia in her father. There is no history of Breast cancer.  ROS:   Please see the history of present illness.     All other systems reviewed and are negative.  EKGs/Labs/Other Studies Reviewed:     The following studies were reviewed today:   EKG:  EKG is  ordered today.  The ekg ordered today demonstrates   12/23/2022- NSR with PVCs  Recent Labs: 05/04/2022: ALT 28; BUN 31; Creatinine, Ser 1.80; Potassium 4.4; Sodium 140 11/02/2022: Hemoglobin 14.8; Platelets 137.0; TSH 1.72   Recent Lipid Panel    Component Value Date/Time   CHOL 152 11/02/2022 0935   TRIG 191.0 (H) 11/02/2022 0935   HDL 52.60 11/02/2022 0935   CHOLHDL 3 11/02/2022 0935   VLDL 38.2 11/02/2022 0935   LDLCALC 61 11/02/2022 0935   LDLDIRECT 86.0 04/30/2021 0929     Risk Assessment/Calculations:    Physical Exam:    VS:   Vitals:   12/23/22 1313  BP: 112/68  Pulse: 85  SpO2: 98%    Wt Readings from Last 3 Encounters:  11/02/22 234 lb (106.1 kg)  06/17/22 234 lb 1.6 oz (106.2 kg)  05/04/22 229 lb 12.8 oz (104.2 kg)     GEN:  Well nourished, well developed in no acute distress HEENT: Normal NECK: No JVD; No carotid bruits LYMPHATICS: No lymphadenopathy CARDIAC: RRR, no murmurs, rubs, gallops RESPIRATORY:  Clear to auscultation without rales, wheezing or rhonchi  ABDOMEN: Soft, non-tender, non-distended MUSCULOSKELETAL:  No edema; No deformity  SKIN: Warm and dry NEUROLOGIC:  Alert and oriented x 3 PSYCHIATRIC:  Normal affect   ASSESSMENT:    1. Elevated coronary artery calcium score   Reports no symptoms.  Will start with an echo. Discussed that if she has progressive CP or SOB to seek medical attention.  -LDL is at goal 61 mg/dL, continue pravastatin 40 mg daily - start asa 81 mg daily PLAN:    In order of problems listed above:  Asa 81 mg daily TTE  Follow up in 6 months      Medication Adjustments/Labs and Tests Ordered: Current medicines are reviewed at length with the patient today.  Concerns regarding medicines are outlined above.  Orders Placed This Encounter  Procedures   EKG 12-Lead   No orders of the defined types were placed in this encounter.   There are no  Patient Instructions on file for this visit.   Signed, Janina Mayo, MD  12/23/2022 1:11 PM    [place]

## 2022-12-28 LAB — HM DIABETES EYE EXAM

## 2023-01-12 ENCOUNTER — Other Ambulatory Visit: Payer: Self-pay | Admitting: Family Medicine

## 2023-01-12 DIAGNOSIS — E039 Hypothyroidism, unspecified: Secondary | ICD-10-CM

## 2023-01-13 ENCOUNTER — Ambulatory Visit (HOSPITAL_BASED_OUTPATIENT_CLINIC_OR_DEPARTMENT_OTHER)
Admission: RE | Admit: 2023-01-13 | Discharge: 2023-01-13 | Disposition: A | Discharge status: Home / Self Care | Payer: Medicare Other | Source: Ambulatory Visit | Attending: Internal Medicine | Admitting: Internal Medicine

## 2023-01-13 DIAGNOSIS — I10 Essential (primary) hypertension: Secondary | ICD-10-CM | POA: Diagnosis present

## 2023-01-13 DIAGNOSIS — E785 Hyperlipidemia, unspecified: Secondary | ICD-10-CM | POA: Diagnosis not present

## 2023-01-13 DIAGNOSIS — E119 Type 2 diabetes mellitus without complications: Secondary | ICD-10-CM | POA: Insufficient documentation

## 2023-01-13 DIAGNOSIS — R931 Abnormal findings on diagnostic imaging of heart and coronary circulation: Secondary | ICD-10-CM

## 2023-01-13 LAB — ECHOCARDIOGRAM COMPLETE
Area-P 1/2: 3.23 cm2
S' Lateral: 2.6 cm

## 2023-02-08 ENCOUNTER — Encounter: Payer: Self-pay | Admitting: *Deleted

## 2023-02-10 ENCOUNTER — Ambulatory Visit (INDEPENDENT_AMBULATORY_CARE_PROVIDER_SITE_OTHER): Payer: Medicare Other | Admitting: Family Medicine

## 2023-02-10 VITALS — BP 120/72 | HR 71 | Temp 97.9°F | Resp 18 | Ht 68.0 in | Wt 234.2 lb

## 2023-02-10 DIAGNOSIS — R0683 Snoring: Secondary | ICD-10-CM

## 2023-02-10 DIAGNOSIS — T148XXA Other injury of unspecified body region, initial encounter: Secondary | ICD-10-CM

## 2023-02-10 NOTE — Progress Notes (Signed)
[facility] [address] [place], [place] [address] [phone]  Date:  02/10/2023   Name:  Tasha Tucker   DOB:  1950-12-05   MRN:  [id number]  PCP:  [name], MD    Chief Complaint: spot on stomach (Pt says she noticed this placed about 2 weeks ago- it started off as a bruise. No pain. LLQ. /DM urine, A1C, Foot exam due/AWV due/)   History of Present Illness:  Tasha Tucker is a 72 y.o. very pleasant female patient who presents with the following:  Patient seen today with concern of a spot on her stomach-she thinks this is possibly a bruise secondary to insulin injections She noticed the spot on her left abdomen about 2 weeks ago; it was somewhat dramatic in appearance but just mildly tender.  Never drained any pus or other material.  It is now nontender but still visible Most recent visit with myself was in February  History of breast cancer diagnosed 2021-her oncologist is Dr. Darnelle Catalan. She was treated with definitive surgical management, completed radiation and is currently taking anastrozole-we will use this for 5 years.    Also history of diabetes, hyperlipidemia, stage IV chronic kidney disease, renal and bladder cancer, cerebral aneurysm status post repair, hypertension She had a left nephrectomy in 2018, brain aneurysm clipping in 1990 Her urologist is Dr. Berneice Heinrich Nephrologist Dr. Signe Colt Endocrinology is managing her insulin-dependent diabetes-Jartavious Mckimmy Easley at [facility]   May be due for a urine protein and A1c if not done provide endocrinology- pt notes this is done per endocrinology Also can update foot exam  Patient Active Problem List   Diagnosis Date Noted   Hypothyroidism 04/30/2021   Thrombocytopenia (HCC) 02/17/2021   Port-A-Cath in place 07/11/2020   Malignant neoplasm of upper-outer quadrant of right breast in female, estrogen receptor positive (HCC) 03/28/2020   Controlled type 2  diabetes mellitus with complication, with long-term current use of insulin (HCC) 03/19/2018   Hyperlipidemia 01/12/2018   Cerebral aneurysm 12/29/2017   Secondary hypertension, unspecified 12/29/2017   CKD (chronic kidney disease) stage 4, GFR 15-29 ml/min (HCC) 12/29/2017   Cancer of renal pelvis, 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   Renal mass 10/09/2016    Past Medical History:  Diagnosis Date   Bladder cancer (HCC)    UROLOGIST- [facility]   Breast cancer (HCC) 2021   right lumpectomy and chemo and radiation done   DJD (degenerative joint disease)    NECK, NEEDS PILLOW WHEN LYING FLAT IS POSSIBLE   History of cerebral aneurysm repair    1996-- S/P CLAPPING RIGHT CAROTID --- NO DEFICITS   History of renal pelvis cancer    10-09-2016  S/P  LEFT RADICAL NEPHROURETERECTOMY -- HIGH GRADE PAPILLARY UROTHELIAL CARCINOMA   Hyperlipidemia    Hypertension    Liver hemangioma    yrs ago per pt on 03-19-2021   OA (osteoarthritis)    PONV (postoperative nausea and vomiting)    Type 2 diabetes mellitus (HCC)    UTI (urinary tract infection)    finished antibiotics 03-01-2021 all symptoms resolved   Wears glasses 03/19/2021    Past Surgical History:  Procedure Laterality Date   ABDOMINAL HYSTERECTOMY  1990   PARTIAL   BREAST LUMPECTOMY WITH RADIOACTIVE SEED AND SENTINEL LYMPH NODE BIOPSY Right 04/17/2020   Procedure: BREAST LUMPECTOMY X 2  WITH RADIOACTIVE SEED AND SENTINEL LYMPH NODE BIOPSY;  Surgeon: Abigail Miyamoto, MD;  Location:  [facility];  Service: General;  Laterality: Right;   CATARACT EXTRACTION W/ INTRAOCULAR LENS  IMPLANT, BILATERAL  2016; 2017   CEREBRAL ANEURYSM REPAIR  1996   clipped-no deficits RIGHT CAROTID   CHOLECYSTECTOMY  2010   laparoscopic   CYSTOSCOPY W/ RETROGRADES Right 06/09/2017   Procedure: CYSTOSCOPY WITH RETROGRADE PYELOGRAM;  Surgeon: Sebastian Ache, MD;  Location: [facility];  Service: Urology;  Laterality: Right;   CYSTOSCOPY W/ RETROGRADES Right  07/21/2017   Procedure: CYSTOSCOPY WITH RETROGRADE PYELOGRAM;  Surgeon: Sebastian Ache, MD;  Location: [facility];  Service: Urology;  Laterality: Right;   CYSTOSCOPY WITH RETROGRADE PYELOGRAM, URETEROSCOPY AND STENT PLACEMENT Left 08/26/2016   Procedure: CYSTOSCOPY WITH BILATERAL RETROGRADE PYELOGRAM,LEFT URETEROSCOPY WITH BIOPSY AND LEFT STENT PLACEMENT;  Surgeon: Sebastian Ache, MD;  Location: [facility];  Service: Urology;  Laterality: Left;   PORT-A-CATH REMOVAL Left 03/24/2021   Procedure: PORT-A-CATH REMOVAL;  Surgeon: Abigail Miyamoto, MD;  Location: [facility];  Service: General;  Laterality: Left;   PORTACATH PLACEMENT Left 06/18/2020   Procedure: INSERTION PORT-A-CATH WITH ULTRASOUND GUIDANCE;  Surgeon: Abigail Miyamoto, MD;  Location: [facility];  Service: General;  Laterality: Left;   ROBOT ASSITED LAPAROSCOPIC NEPHROURETERECTOMY Left 10/09/2016   Procedure: XI ROBOT ASSITED LAPAROSCOPIC NEPHROURETERECTOMY;  Surgeon: Sebastian Ache, MD;  Location: [facility];  Service: Urology;  Laterality: Left;   SURGERY ON FOOT FOR STAPH INFECTION  1989   TRANSURETHRAL RESECTION OF BLADDER TUMOR N/A 07/21/2017   Procedure: TRANSURETHRAL RESECTION OF BLADDER TUMOR (TURBT);  Surgeon: Sebastian Ache, MD;  Location: [facility];  Service: Urology;  Laterality: N/A;   TRANSURETHRAL RESECTION OF BLADDER TUMOR WITH MITOMYCIN-C N/A 06/09/2017   Procedure: TRANSURETHRAL RESECTION OF BLADDER TUMOR WITH MITOMYCIN-C;  Surgeon: Sebastian Ache, MD;  Location: [facility];  Service: Urology;  Laterality: N/A;   TUBAL LIGATION  1980    Social History   Tobacco Use   Smoking status: Never   Smokeless tobacco: Never  Vaping Use   Vaping Use: Never used  Substance Use Topics   Alcohol use: Not Currently    Comment: , STOPPED   Drug use: No    Family History  Problem Relation Age of Onset   Cancer Mother    Hyperlipidemia Father     Heart attack Father    Diabetes Brother    Breast cancer Neg Hx     Allergies  Allergen Reactions   Other Other (See Comments)   Adhesive [Tape] Rash    PAPER TAPE ONLY to use   Chlorhexidine Gluconate Itching and Rash    Topical antiseptic (CHG WIPES)    Dilantin [Phenytoin] Rash   Penicillins Rash    Has patient had a PCN reaction causing immediate rash, facial/tongue/throat swelling, SOB or lightheadedness with hypotension:unsure Has patient had a PCN reaction causing severe rash involving mucus membranes or skin necrosis:unsure Has patient had a PCN reaction that required hospitalization:No Has patient had a PCN reaction occurring within the last 10 years:No If all of the above answers are "NO", then may proceed with Cephalosporin use.    Sulfa Antibiotics Rash    Medication list has been reviewed and updated.  Current Outpatient Medications on File Prior to Visit  Medication Sig Dispense Refill   acetaminophen (TYLENOL) 500 MG tablet Take 500 mg by mouth at bedtime.      anastrozole (ARIMIDEX) 1 MG tablet Take 1 tablet (1 mg total) by mouth daily. 90 tablet 4  aspirin EC 81 MG tablet Take 1 tablet (81 mg total) by mouth daily. Swallow whole. 90 tablet 3   BD PEN NEEDLE NANO U/F 32G X 4 MM MISC USE FOR INSULIN ADMINISTRATION DAILY 100 each 3   Biotin 10 MG CAPS Take by mouth daily. 5000 mcg bid per pt 30 capsule    carvedilol (COREG) 3.125 MG tablet in the morning and at bedtime.     cetirizine (ZYRTEC) 10 MG tablet Take 10 mg by mouth every morning.     Continuous Blood Gluc Sensor (FREESTYLE LIBRE 14 DAY SENSOR) MISC by Does not apply route.     HUMALOG KWIKPEN 100 UNIT/ML KwikPen Inject 7 Units into the skin 3 (three) times daily. Usually takes 6 units     insulin glargine (LANTUS SOLOSTAR) 100 UNIT/ML Solostar Pen Inject 26 Units into the skin every evening.     levothyroxine (SYNTHROID) 50 MCG tablet Take 1 tablet (50 mcg total) by mouth daily before breakfast. 90  tablet 1   OVER THE COUNTER MEDICATION Vitamin d 3 50 mcg daily     pravastatin (PRAVACHOL) 40 MG tablet Take 1 tablet (40 mg total) by mouth daily. 90 tablet 1   No current facility-administered medications on file prior to visit.    Review of Systems:  As per HPI- otherwise negative.   Physical Examination: Vitals:   02/10/23 1311  BP: 120/72  Pulse: 71  Resp: 18  Temp: 97.9 F (36.6 C)  SpO2: 97%   Vitals:   02/10/23 1311  Weight: 234 lb 3.2 oz (106.2 kg)  Height: 5\' 8"  (1.727 m)   Body mass index is 35.61 kg/m. Ideal Body Weight: Weight in (lb) to have BMI = 25: 164.1  GEN: no acute distress.  Obese, looks well HEENT: Atraumatic, Normocephalic.  Ears and Nose: No external deformity. CV: RRR, No M/G/R. No JVD. No thrill. No extra heart sounds. PULM: CTA B, no wheezes, crackles, rhonchi. No retractions. No resp. distress. No accessory muscle use. ABD: S, NT, ND, +BS. No rebound. No HSM. EXTR: No c/c/e PSYCH: Normally interactive. Conversant.  Foot exam-can palpate dorsal pulses bilaterally, though weak on the right.  Normal sensation to monofilament There is a likely resolving hematoma over the left abdomen.  The center is soft and somewhat fluctuant about nontender, no heat or tenderness to suggest an abscess The rest of the belly is benign   Assessment and Plan: Snoring - Plan: Ambulatory referral to Neurology  Hematoma  Patient notes her husband has complained of her snoring.  She would like to have a sleep study done to evaluate for sleep apnea.  Order this for her today Likely resolving hematoma on her abdomen.  Suspect she developed a hematoma from insulin shots.  It is not currently tender, no evidence of infection.  Patient notes it seems to be getting better gradually.  Will continue to observe, she will let me know if any other concerns Otherwise continue follow-up per her usual routine-she has an appointment to see me in August  Signed Abbe Amsterdam, MD

## 2023-02-10 NOTE — Patient Instructions (Signed)
Good to see you today- I think the spot on your abdomen is likely due to giving injections in your abdomen.  However, if not resolving over the next 2 weeks or so please let me know!    I will also set you up for a sleep study to determine if you have sleep apnea

## 2023-02-17 ENCOUNTER — Encounter: Payer: Self-pay | Admitting: Family Medicine

## 2023-02-17 ENCOUNTER — Encounter: Payer: Self-pay | Admitting: Oncology

## 2023-03-17 ENCOUNTER — Other Ambulatory Visit: Payer: Self-pay | Admitting: Family Medicine

## 2023-03-17 DIAGNOSIS — Z853 Personal history of malignant neoplasm of breast: Secondary | ICD-10-CM

## 2023-03-22 ENCOUNTER — Ambulatory Visit (INDEPENDENT_AMBULATORY_CARE_PROVIDER_SITE_OTHER): Payer: Medicare Other | Admitting: Neurology

## 2023-03-22 ENCOUNTER — Encounter: Payer: Self-pay | Admitting: Neurology

## 2023-03-22 VITALS — BP 138/60 | HR 72 | Ht 68.0 in | Wt 228.0 lb

## 2023-03-22 DIAGNOSIS — R0681 Apnea, not elsewhere classified: Secondary | ICD-10-CM

## 2023-03-22 DIAGNOSIS — R0683 Snoring: Secondary | ICD-10-CM

## 2023-03-22 DIAGNOSIS — E669 Obesity, unspecified: Secondary | ICD-10-CM

## 2023-03-22 DIAGNOSIS — Z9189 Other specified personal risk factors, not elsewhere classified: Secondary | ICD-10-CM

## 2023-03-22 DIAGNOSIS — R351 Nocturia: Secondary | ICD-10-CM

## 2023-03-22 NOTE — Progress Notes (Signed)
Subjective:    Patient ID: Tasha Tucker is a 72 y.o. female.  HPI    Huston Foley, MD, PhD [facility] [address], [address] [address] [place], [place] [address]  Dear Shanda Bumps,  I saw your patient, Tasha Tucker, upon your kind request in my sleep clinic today for initial consultation of her sleep disorder, in particular, concern for underlying obstructive sleep apnea.  The patient is unaccompanied today.  As you know, [name] is a 72 year old female with an underlying medical history of breast cancer with status post right lumpectomy and chemo-radiation, degenerative joint disease, cerebral aneurysm repair, hypertension, hyperlipidemia, osteoarthritis, liver hemangioma, type 2 diabetes, bladder cancer with status post left radical nephroureterectomy in 2019, and obesity, who reports snoring and witnessed apneas per husband's observation.  Her Epworth sleepiness score is 3 out of 24, fatigue severity score is 26 out of 63.  She reports that her husband has sleep apnea and uses a PAP machine.  He feels like she may need 1.  I reviewed your office note from 02/10/2023.  She goes to bed around 9 and rise time is around 6:30 AM or 7 AM.  She is retired, she used to work as a Administrator, arts.  They have 3 grown children and 3 grandchildren.  Her weight has been more or less stable.  She denies nocturnal or morning headaches but has nocturia about 2-3 times per average night.  She is not aware of any family history of sleep apnea.  She is a non-smoker and drinks alcohol very occasionally, on special occasions, she drinks caffeine in the form of coffee, about 2 cups in the mornings.  She does not have a TV in her bedroom.  They have 1 cat in the household and the cat occasionally sleeps in the room at night.  Her Past Medical History Is Significant For: Past Medical History:  Diagnosis Date   Bladder cancer (HCC)    UROLOGIST- DR [facility]   Breast  cancer (HCC) 2021   right lumpectomy and chemo and radiation done   DJD (degenerative joint disease)    NECK, NEEDS PILLOW WHEN LYING FLAT IS POSSIBLE   History of cerebral aneurysm repair    1996-- S/P CLAPPING RIGHT CAROTID --- NO DEFICITS   History of renal pelvis cancer    10-09-2016  S/P  LEFT RADICAL NEPHROURETERECTOMY -- HIGH GRADE PAPILLARY UROTHELIAL CARCINOMA   Hyperlipidemia    Hypertension    Liver hemangioma    yrs ago per pt on 03-19-2021   OA (osteoarthritis)    PONV (postoperative nausea and vomiting)    Type 2 diabetes mellitus (HCC)    UTI (urinary tract infection)    finished antibiotics 03-01-2021 all symptoms resolved   Wears glasses 03/19/2021    Her Past Surgical History Is Significant For: Past Surgical History:  Procedure Laterality Date   ABDOMINAL HYSTERECTOMY  1990   PARTIAL   BREAST LUMPECTOMY WITH RADIOACTIVE SEED AND SENTINEL LYMPH NODE BIOPSY Right 04/17/2020   Procedure: BREAST LUMPECTOMY X 2  WITH RADIOACTIVE SEED AND SENTINEL LYMPH NODE BIOPSY;  Surgeon: Abigail Miyamoto, MD;  [facility];  Service: General;  Laterality: Right;   CATARACT EXTRACTION W/ INTRAOCULAR LENS  IMPLANT, BILATERAL  2016; 2017   CEREBRAL ANEURYSM REPAIR  1996   clipped-no deficits RIGHT CAROTID   CHOLECYSTECTOMY  2010   laparoscopic   CYSTOSCOPY W/ RETROGRADES Right 06/09/2017   Procedure: CYSTOSCOPY WITH RETROGRADE PYELOGRAM;  Surgeon: Berneice Heinrich,  Normand Sloop, MD;  Location: [facility];  Service: Urology;  Laterality: Right;   CYSTOSCOPY W/ RETROGRADES Right 07/21/2017   Procedure: CYSTOSCOPY WITH RETROGRADE PYELOGRAM;  Surgeon: Sebastian Ache, MD;  Location: [facility];  Service: Urology;  Laterality: Right;   CYSTOSCOPY WITH RETROGRADE PYELOGRAM, URETEROSCOPY AND STENT PLACEMENT Left 08/26/2016   Procedure: CYSTOSCOPY WITH BILATERAL RETROGRADE PYELOGRAM,LEFT URETEROSCOPY WITH BIOPSY AND LEFT STENT PLACEMENT;  Surgeon: Sebastian Ache, MD;   Location: [facility];  Service: Urology;  Laterality: Left;   PORT-A-CATH REMOVAL Left 03/24/2021   Procedure: PORT-A-CATH REMOVAL;  Surgeon: Abigail Miyamoto, MD;  Location: [facility];  Service: General;  Laterality: Left;   PORTACATH PLACEMENT Left 06/18/2020   Procedure: INSERTION PORT-A-CATH WITH ULTRASOUND GUIDANCE;  Surgeon: Abigail Miyamoto, MD;  [facility];  Service: General;  Laterality: Left;   ROBOT ASSITED LAPAROSCOPIC NEPHROURETERECTOMY Left 10/09/2016   Procedure: XI ROBOT ASSITED LAPAROSCOPIC NEPHROURETERECTOMY;  Surgeon: Sebastian Ache, MD;  Location: [facility];  Service: Urology;  Laterality: Left;   SURGERY ON FOOT FOR STAPH INFECTION  1989   TRANSURETHRAL RESECTION OF BLADDER TUMOR N/A 07/21/2017   Procedure: TRANSURETHRAL RESECTION OF BLADDER TUMOR (TURBT);  Surgeon: Sebastian Ache, MD;  Location[facility];  Service: Urology;  Laterality: N/A;   TRANSURETHRAL RESECTION OF BLADDER TUMOR WITH MITOMYCIN-C N/A 06/09/2017   Procedure: TRANSURETHRAL RESECTION OF BLADDER TUMOR WITH MITOMYCIN-C;  Surgeon: Sebastian Ache, MD;  Location: [facility];  Service: Urology;  Laterality: N/A;   TUBAL LIGATION  1980    Her Family History Is Significant For: Family History  Problem Relation Age of Onset   Cancer Mother    Hyperlipidemia Father    Heart attack Father    Diabetes Brother    Breast cancer Neg Hx    Sleep apnea Neg Hx     Her Social History Is Significant For: Social History   Socioeconomic History   Marital status: Married    Spouse name: Not on file   Number of children: Not on file   Years of education: Not on file   Highest education level: Bachelor's degree (e.g., BA, AB, BS)  Occupational History   Not on file  Tobacco Use   Smoking status: Never   Smokeless tobacco: Never  Vaping Use   Vaping Use: Never used  Substance and Sexual Activity   Alcohol use: Not Currently    Comment: ,  STOPPED   Drug use: No   Sexual activity: Yes  Other Topics Concern   Not on file  Social History Narrative   Not on file   Social Determinants of Health   Financial Resource Strain: Low Risk  (02/09/2023)   Overall Financial Resource Strain (CARDIA)    Difficulty of Paying Living Expenses: Not hard at all  Food Insecurity: No Food Insecurity (02/09/2023)   Hunger Vital Sign    Worried About Running Out of Food in the Last Year: Never true    Ran Out of Food in the Last Year: Never true  Transportation Needs: No Transportation Needs (02/09/2023)   PRAPARE - Administrator, Civil Service (Medical): No    Lack of Transportation (Non-Medical): No  Physical Activity: Insufficiently Active (02/09/2023)   Exercise Vital Sign    Days of Exercise per Week: 3 days    Minutes of Exercise per Session: 40 min  Stress: No Stress Concern Present (02/09/2023)   [employer] of Occupational Health - Occupational Stress  Questionnaire    Feeling of Stress : Not at all  Social Connections: Socially Integrated (02/09/2023)   Social Connection and Isolation Panel [NHANES]    Frequency of Communication with Friends and Family: More than three times a week    Frequency of Social Gatherings with Friends and Family: Once a week    Attends Religious Services: More than 4 times per year    Active Member of [employer] or Organizations: Yes    Attends Engineer, structural: More than 4 times per year    Marital Status: Married    Her Allergies Are:  Allergies  Allergen Reactions   Other Other (See Comments)   Adhesive [Tape] Rash    PAPER TAPE ONLY to use   Chlorhexidine Gluconate Itching and Rash    Topical antiseptic (CHG WIPES)    Dilantin [Phenytoin] Rash   Penicillins Rash    Has patient had a PCN reaction causing immediate rash, facial/tongue/throat swelling, SOB or lightheadedness with hypotension:unsure Has patient had a PCN reaction causing severe rash involving mucus  membranes or skin necrosis:unsure Has patient had a PCN reaction that required hospitalization:No Has patient had a PCN reaction occurring within the last 10 years:No If all of the above answers are "NO", then may proceed with Cephalosporin use.    Sulfa Antibiotics Rash  :   Her Current Medications Are:  Outpatient Encounter Medications as of 03/22/2023  Medication Sig   acetaminophen (TYLENOL) 500 MG tablet Take 500 mg by mouth at bedtime.    anastrozole (ARIMIDEX) 1 MG tablet Take 1 tablet (1 mg total) by mouth daily.   aspirin EC 81 MG tablet Take 1 tablet (81 mg total) by mouth daily. Swallow whole.   BD PEN NEEDLE NANO U/F 32G X 4 MM MISC USE FOR INSULIN ADMINISTRATION DAILY   Biotin 10 MG CAPS Take by mouth daily. 5000 mcg bid per pt   carvedilol (COREG) 3.125 MG tablet in the morning and at bedtime.   cetirizine (ZYRTEC) 10 MG tablet Take 10 mg by mouth every morning.   Continuous Blood Gluc Sensor (FREESTYLE LIBRE 14 DAY SENSOR) MISC by Does not apply route.   HUMALOG KWIKPEN 100 UNIT/ML KwikPen Inject 7 Units into the skin 3 (three) times daily. Usually takes 6 units   insulin glargine (LANTUS SOLOSTAR) 100 UNIT/ML Solostar Pen Inject 26 Units into the skin every evening.   levothyroxine (SYNTHROID) 50 MCG tablet Take 1 tablet (50 mcg total) by mouth daily before breakfast.   OVER THE COUNTER MEDICATION Vitamin d 3 50 mcg daily   pravastatin (PRAVACHOL) 40 MG tablet Take 1 tablet (40 mg total) by mouth daily.   No facility-administered encounter medications on file as of 03/22/2023.  :   Review of Systems:  Out of a complete 14 point review of systems, all are reviewed and negative with the exception of these symptoms as listed below:   Review of Systems  Neurological:        Pt here for sleep consult   Pt snores,little fatigue Pt denies headaches,hypertension,sleep study,CPAP machine    ESS:3 FSS:26     Objective:  Neurological Exam  Physical Exam Physical  Examination:   Vitals:   03/22/23 0923  BP: 138/60  Pulse: 72    General Examination: The patient is a very pleasant 72 y.o. female in no acute distress. She appears well-developed and well-nourished and well groomed.   HEENT: Normocephalic, atraumatic, pupils are equal, round and reactive to light, extraocular tracking is  good without limitation to gaze excursion or nystagmus noted. Hearing is grossly intact. Face is symmetric with normal facial animation. Speech is clear with no dysarthria noted. There is no hypophonia. There is no lip, neck/head, jaw or voice tremor. Neck is supple with full range of passive and active motion. There are no carotid bruits on auscultation. Oropharynx exam reveals: mild mouth dryness, adequate dental hygiene and mild airway crowding, due to small airway entry, Mallampati class II, neck circumference 15-3/8 inches, minimal to mild overbite noted, smaller tonsils.  Right tonsil a little easier to see than left.  Tongue protrudes centrally and palate elevates symmetrically.  Chest: Clear to auscultation without wheezing, rhonchi or crackles noted.  Heart: S1+S2+0, regular and normal without murmurs, rubs or gallops noted.   Abdomen: Soft, non-tender and non-distended.  Extremities: There is no pitting edema in the distal lower extremities bilaterally.   Skin: Warm and dry without trophic changes noted.   Musculoskeletal: exam reveals no obvious joint deformities, left ankle wider than right, she has a history of left ankle injuries.  Neurologically:  Mental status: The patient is awake, alert and oriented in all 4 spheres. Her immediate and remote memory, attention, language skills and fund of knowledge are appropriate. There is no evidence of aphasia, agnosia, apraxia or anomia. Speech is clear with normal prosody and enunciation. Thought process is linear. Mood is normal and affect is normal.  Cranial nerves II - XII are as described above under HEENT exam.   Motor exam: Normal bulk, strength and tone is noted. There is no obvious action or resting tremor.  Fine motor skills and coordination: grossly intact.  Cerebellar testing: No dysmetria or intention tremor. There is no truncal or gait ataxia.  Sensory exam: intact to light touch in the upper and lower extremities.  Gait, station and balance: She stands easily. No veering to one side is noted. No leaning to one side is noted. Posture is age-appropriate and stance is narrow based. Gait shows normal stride length and normal pace. No problems turning are noted.   Assessment and Plan:  In summary, MASIEL GENTZLER is a very pleasant 72 y.o.-year old female with an underlying medical history of breast cancer with status post right lumpectomy and chemo-radiation, degenerative joint disease, cerebral aneurysm repair, hypertension, hyperlipidemia, osteoarthritis, liver hemangioma, type 2 diabetes, bladder cancer with status post left radical nephroureterectomy in 2019, and obesity, whose history and physical exam are concerning for sleep disordered breathing, particularly obstructive sleep apnea (OSA). A laboratory attended sleep study is typically considered "gold standard" for evaluation of sleep disordered breathing.   I had a long chat with the patient about my findings and the diagnosis of sleep apnea, particularly OSA, its prognosis and treatment options. We talked about medical/conservative treatments, surgical interventions and non-pharmacological approaches for symptom control. I explained, in particular, the risks and ramifications of untreated moderate to severe OSA, especially with respect to developing cardiovascular disease down the road, including congestive heart failure (CHF), difficult to treat hypertension, cardiac arrhythmias (particularly A-fib), neurovascular complications including TIA, stroke and dementia. Even type 2 diabetes has, in part, been linked to untreated OSA. Symptoms of untreated  OSA may include (but may not be limited to) daytime sleepiness, nocturia (i.e. frequent nighttime urination), memory problems, mood irritability and suboptimally controlled or worsening mood disorder such as depression and/or anxiety, lack of energy, lack of motivation, physical discomfort, as well as recurrent headaches, especially morning or nocturnal headaches. We talked about the importance of  maintaining a healthy lifestyle and striving for healthy weight. In addition, we talked about the importance of striving for and maintaining good sleep hygiene. I recommended a sleep study at this time. I outlined the differences between a laboratory attended sleep study which is considered more comprehensive and accurate over the option of a home sleep test (HST); the latter may lead to underestimation of sleep disordered breathing in some instances and does not help with diagnosing upper airway resistance syndrome and is not accurate enough to diagnose primary central sleep apnea typically. I outlined possible surgical and non-surgical treatment options of OSA, including the use of a positive airway pressure (PAP) device (i.e. CPAP, AutoPAP/APAP or BiPAP in certain circumstances), a custom-made dental device (aka oral appliance, which would require a referral to a specialist dentist or orthodontist typically, and is generally speaking not considered for patients with full dentures or edentulous state), upper airway surgical options, such as traditional UPPP (which is not considered a first-line treatment) or the Inspire device (hypoglossal nerve stimulator, which would involve a referral for consultation with an ENT surgeon, after careful selection, following inclusion criteria - also not first-line treatment). I explained the PAP treatment option to the patient in detail, as this is generally considered first-line treatment.  The patient indicated that she would be willing to try PAP therapy, if the need arises. I  explained the importance of being compliant with PAP treatment, not only for insurance purposes but primarily to improve patient's symptoms symptoms, and for the patient's long term health benefit, including to reduce Her cardiovascular risks longer-term.    We will pick up our discussion about the next steps and treatment options after testing.  We will keep her posted as to the test results by phone call and/or MyChart messaging where possible.  We will plan to follow-up in sleep clinic accordingly as well.  I answered all her questions today and the patient was in agreement.   I encouraged her to call with any interim questions, concerns, problems or updates or email [place] through MyChart.  Generally speaking, sleep test authorizations may take up to 2 weeks, sometimes less, sometimes longer, the patient is encouraged to get in touch with [place] if they do not hear back from the sleep lab staff directly within the next 2 weeks.  Thank you very much for allowing me to participate in the care of this nice patient. If I can be of any further assistance to you please do not hesitate to call me at [phone].  Sincerely,   Huston Foley, MD, PhD

## 2023-03-22 NOTE — Patient Instructions (Signed)

## 2023-04-19 ENCOUNTER — Ambulatory Visit: Payer: Medicare Other | Attending: Surgery | Admitting: Rehabilitation

## 2023-04-19 ENCOUNTER — Encounter: Payer: Self-pay | Admitting: Rehabilitation

## 2023-04-19 DIAGNOSIS — Z483 Aftercare following surgery for neoplasm: Secondary | ICD-10-CM | POA: Insufficient documentation

## 2023-04-19 NOTE — Therapy (Signed)
OUTPATIENT PHYSICAL THERAPY SOZO SCREENING NOTE   Patient Name: Tasha Tucker MRN: [id number] DOB:Feb 20, 1951, 72 y.o., female Today's Date: 04/19/2023  PCP: [name], MD REFERRING PROVIDER: Abigail Miyamoto, MD   PT End of Session - 04/19/23 0931     Visit Number 2   screen   PT Start Time 0925    PT Stop Time 0931    PT Time Calculation (min) 6 min    Activity Tolerance Patient tolerated treatment well    Behavior During Therapy WFL for tasks assessed/performed             Past Medical History:  Diagnosis Date   Bladder cancer (HCC)    UROLOGIST- DR [facility]   Breast cancer (HCC) 2021   right lumpectomy and chemo and radiation done   DJD (degenerative joint disease)    NECK, NEEDS PILLOW WHEN LYING FLAT IS POSSIBLE   History of cerebral aneurysm repair    1996-- S/P CLAPPING RIGHT CAROTID --- NO DEFICITS   History of renal pelvis cancer    10-09-2016  S/P  LEFT RADICAL NEPHROURETERECTOMY -- HIGH GRADE PAPILLARY UROTHELIAL CARCINOMA   Hyperlipidemia    Hypertension    Liver hemangioma    yrs ago per pt on 03-19-2021   OA (osteoarthritis)    PONV (postoperative nausea and vomiting)    Type 2 diabetes mellitus (HCC)    UTI (urinary tract infection)    finished antibiotics 03-01-2021 all symptoms resolved   Wears glasses 03/19/2021   Past Surgical History:  Procedure Laterality Date   ABDOMINAL HYSTERECTOMY  1990   PARTIAL   BREAST LUMPECTOMY WITH RADIOACTIVE SEED AND SENTINEL LYMPH NODE BIOPSY Right 04/17/2020   Procedure: BREAST LUMPECTOMY X 2  WITH RADIOACTIVE SEED AND SENTINEL LYMPH NODE BIOPSY;  Surgeon: Abigail Miyamoto, MD;  Location: [facility];  Service: General;  Laterality: Right;   CATARACT EXTRACTION W/ INTRAOCULAR LENS  IMPLANT, BILATERAL  2016; 2017   CEREBRAL ANEURYSM REPAIR  1996   clipped-no deficits RIGHT CAROTID   CHOLECYSTECTOMY  2010   laparoscopic   CYSTOSCOPY W/ RETROGRADES Right 06/09/2017   Procedure: CYSTOSCOPY WITH RETROGRADE  PYELOGRAM;  Surgeon: Sebastian Ache, MD;  Location[facility];  Service: Urology;  Laterality: Right;   CYSTOSCOPY W/ RETROGRADES Right 07/21/2017   Procedure: CYSTOSCOPY WITH RETROGRADE PYELOGRAM;  Surgeon: Sebastian Ache, MD;  Location[facility];  Service: Urology;  Laterality: Right;   CYSTOSCOPY WITH RETROGRADE PYELOGRAM, URETEROSCOPY AND STENT PLACEMENT Left 08/26/2016   Procedure: CYSTOSCOPY WITH BILATERAL RETROGRADE PYELOGRAM,LEFT URETEROSCOPY WITH BIOPSY AND LEFT STENT PLACEMENT;  Surgeon: Sebastian Ache, MD;  Location: [facility];  Service: Urology;  Laterality: Left;   PORT-A-CATH REMOVAL Left 03/24/2021   Procedure: PORT-A-CATH REMOVAL;  Surgeon: Abigail Miyamoto, MD;  Location[facility];  Service: General;  Laterality: Left;   PORTACATH PLACEMENT Left 06/18/2020   Procedure: INSERTION PORT-A-CATH WITH ULTRASOUND GUIDANCE;  Surgeon: Abigail Miyamoto, MD;  Location[facility];  Service: General;  Laterality: Left;   ROBOT ASSITED LAPAROSCOPIC NEPHROURETERECTOMY Left 10/09/2016   Procedure: XI ROBOT ASSITED LAPAROSCOPIC NEPHROURETERECTOMY;  Surgeon: Sebastian Ache, MD;  Location: [facility];  Service: Urology;  Laterality: Left;   SURGERY ON FOOT FOR STAPH INFECTION  1989   TRANSURETHRAL RESECTION OF BLADDER TUMOR N/A 07/21/2017   Procedure: TRANSURETHRAL RESECTION OF BLADDER TUMOR (TURBT);  Surgeon: Sebastian Ache, MD;  Location: [facility];  Service: Urology;  Laterality: N/A;   TRANSURETHRAL RESECTION OF BLADDER  TUMOR WITH MITOMYCIN-C N/A 06/09/2017   Procedure: TRANSURETHRAL RESECTION OF BLADDER TUMOR WITH MITOMYCIN-C;  Surgeon: Sebastian Ache, MD;  Location: [facility];  Service: Urology;  Laterality: N/A;   TUBAL LIGATION  1980   Patient Active Problem List   Diagnosis Date Noted   Hypothyroidism 04/30/2021   Thrombocytopenia (HCC) 02/17/2021   Port-A-Cath in place 07/11/2020    Malignant neoplasm of upper-outer quadrant of right breast in female, estrogen receptor positive (HCC) 03/28/2020   Controlled type 2 diabetes mellitus with complication, with long-term current use of insulin (HCC) 03/19/2018   Hyperlipidemia 01/12/2018   Cerebral aneurysm 12/29/2017   Secondary hypertension, unspecified 12/29/2017   CKD (chronic kidney disease) stage 4, GFR 15-29 ml/min (HCC) 12/29/2017   Cancer of renal pelvis, 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   Renal mass 10/09/2016    REFERRING DIAG: right breast cancer at risk for lymphedema  THERAPY DIAG: Aftercare following surgery for neoplasm  PERTINENT HISTORY: Patient was diagnosed on 03/13/2020 with 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 She underwent a right double lumpectomy with a sentinel node biopsy (1 negative node) on 04/17/2020. It is ER/PR positive and HER2 negative with a Ki67 of 5%. She has a history of bladder and renal carcinoma from 06/2017  PRECAUTIONS: right UE Lymphedema risk, None  SUBJECTIVE: Pt returns for her 3 month L-Dex screen.   PAIN:  Are you having pain? No  SOZO SCREENING: Patient was assessed today using the SOZO machine to determine the lymphedema index score. This was compared to her baseline score. It was determined that she is within the recommended range when compared to her baseline and no further action is needed at this time. She will continue SOZO screenings. These are done every 3 months for 2 years post operatively followed by every 6 months for 2 years, and then annually.  Pt is now done with SOZO screenings     L-DEX FLOWSHEETS - 04/19/23 0900       L-DEX LYMPHEDEMA SCREENING   Measurement Type Unilateral    L-DEX MEASUREMENT EXTREMITY Upper Extremity    POSITION  Standing    DOMINANT SIDE Right    At Risk Side Right    BASELINE SCORE (UNILATERAL) 4.1    L-DEX SCORE (UNILATERAL) -3.2    VALUE CHANGE (UNILAT) -7.3             L-DEX FLOWSHEETS - 04/19/23 0900        L-DEX LYMPHEDEMA SCREENING   Measurement Type Unilateral    L-DEX MEASUREMENT EXTREMITY Upper Extremity    POSITION  Standing    DOMINANT SIDE Right    At Risk Side Right    BASELINE SCORE (UNILATERAL) 4.1    L-DEX SCORE (UNILATERAL) -3.2    VALUE CHANGE (UNILAT) -7.3              Argus Caraher R, PT 04/19/2023, 9:31 AM

## 2023-04-21 LAB — MICROALBUMIN / CREATININE URINE RATIO: Microalb Creat Ratio: 143

## 2023-04-21 LAB — IRON,TIBC AND FERRITIN PANEL
%SAT: 33
Ferritin: 346
Iron: 103
TIBC: 311
UIBC: 208

## 2023-04-21 LAB — CBC AND DIFFERENTIAL
HCT: 44 (ref 36–46)
Hemoglobin: 14.8 (ref 12.0–16.0)
WBC: 5.3

## 2023-04-21 LAB — BASIC METABOLIC PANEL
BUN: 26 — AB (ref 4–21)
CO2: 25 — AB (ref 13–22)
Chloride: 104 (ref 99–108)
Chloride: 104 (ref 99–108)
Creatinine: 1.6 — AB (ref 0.5–1.1)
Creatinine: 1.6 — AB (ref 0.5–1.1)
Glucose: 208
Potassium: 4.6 mEq/L (ref 3.5–5.1)
Sodium: 137 (ref 137–147)

## 2023-04-21 LAB — COMPREHENSIVE METABOLIC PANEL
Albumin: 4.1 (ref 3.5–5.0)
Albumin: 4.1 (ref 3.5–5.0)
Calcium: 10.4 (ref 8.7–10.7)
eGFR: 35

## 2023-04-21 LAB — CBC: RBC: 4.82 (ref 3.87–5.11)

## 2023-04-21 LAB — HEPATIC FUNCTION PANEL: Alkaline Phosphatase: 3.3 — AB (ref 25–125)

## 2023-04-21 LAB — VITAMIN D 25 HYDROXY (VIT D DEFICIENCY, FRACTURES): Vit D, 25-Hydroxy: 105

## 2023-04-27 NOTE — Progress Notes (Signed)
[facility] [address] [place], [place] [address] [phone]  Date:  05/03/2023   Name:  Tasha Tucker   DOB:  May 01, 1951   MRN:  [id number]  PCP:  [name], MD    Chief Complaint: 6 month follow up (Concerns/ questions: none/DM urine and A1C due/AWV due)   History of Present Illness:  Tasha Tucker is a 72 y.o. very pleasant female patient who presents with the following:  Patient seen today for periodic follow-up Most recent visit with myself was in May of this year at which time she had a bruise on her abdomen, suspected due to insulin injections.  She notes that his bruises still present but has gotten quite a bit better.  I offered to have this area ultrasounded, however at this point Tasha Tucker feels it is much improved and prefers to continue observation  We also referred her for sleep study for concern of possible sleep apnea She has seen neurology but not yet had a sleep study that I can see- this will be done 07/12/23  History of breast cancer diagnosed 2021-her oncologist is Dr. Darnelle Catalan. She was treated with definitive surgical management, completed radiation and is currently taking anastrozole-we will use this for 5 years.  She does get hot flashes midmorning most days likely due to her anastrozole  Also history of diabetes, hyperlipidemia, stage IV chronic kidney disease, renal and bladder cancer, cerebral aneurysm status post repair, hypertension She had a left nephrectomy in 2018, brain aneurysm clipping in 1990 Her urologist is Dr. Berneice Heinrich Nephrologist Dr. Signe Colt Endocrinology is managing her insulin-dependent diabetes- [place] [facility]   She notes A1c 7.6%  04/05/23 per Dennie Maizes Mammo will be done tomorrow  Dexa can be done in September -will order for today  Thyroid, lipids done in February.  Patient prefers to have this blood work done in another 6 months, declines blood work  today  Lab Results  Component Value Date   TSH 1.72 11/02/2022     Patient Active Problem List   Diagnosis Date Noted   Hypothyroidism 04/30/2021   Thrombocytopenia (HCC) 02/17/2021   Port-A-Cath in place 07/11/2020   Malignant neoplasm of upper-outer quadrant of right breast in female, estrogen receptor positive (HCC) 03/28/2020   Controlled type 2 diabetes mellitus with complication, with long-term current use of insulin (HCC) 03/19/2018   Hyperlipidemia 01/12/2018   Cerebral aneurysm 12/29/2017   Secondary hypertension, unspecified 12/29/2017   CKD (chronic kidney disease) stage 4, GFR 15-29 ml/min (HCC) 12/29/2017   Cancer of renal pelvis, 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   Renal mass 10/09/2016    Past Medical History:  Diagnosis Date   Bladder cancer (HCC)    UROLOGIST- [facility]   Breast cancer (HCC) 2021   right lumpectomy and chemo and radiation done   DJD (degenerative joint disease)    NECK, NEEDS PILLOW WHEN LYING FLAT IS POSSIBLE   History of cerebral aneurysm repair    1996-- S/P CLAPPING RIGHT CAROTID --- NO DEFICITS   History of renal pelvis cancer    10-09-2016  S/P  LEFT RADICAL NEPHROURETERECTOMY -- HIGH GRADE PAPILLARY UROTHELIAL CARCINOMA   Hyperlipidemia    Hypertension    Liver hemangioma    yrs ago per pt on 03-19-2021   OA (osteoarthritis)    PONV (postoperative nausea and vomiting)    Type 2 diabetes mellitus (HCC)    UTI (urinary tract infection)  finished antibiotics 03-01-2021 all symptoms resolved   Wears glasses 03/19/2021    Past Surgical History:  Procedure Laterality Date   ABDOMINAL HYSTERECTOMY  1990   PARTIAL   BREAST LUMPECTOMY WITH RADIOACTIVE SEED AND SENTINEL LYMPH NODE BIOPSY Right 04/17/2020   Procedure: BREAST LUMPECTOMY X 2  WITH RADIOACTIVE SEED AND SENTINEL LYMPH NODE BIOPSY;  Surgeon: Abigail Miyamoto, MD;  Location: [facility];  Service: General;  Laterality: Right;   CATARACT EXTRACTION W/ INTRAOCULAR LENS  IMPLANT, BILATERAL   2016; 2017   CEREBRAL ANEURYSM REPAIR  1996   clipped-no deficits RIGHT CAROTID   CHOLECYSTECTOMY  2010   laparoscopic   CYSTOSCOPY W/ RETROGRADES Right 06/09/2017   Procedure: CYSTOSCOPY WITH RETROGRADE PYELOGRAM;  Surgeon: Sebastian Ache, MD;  Location: [facility];  Service: Urology;  Laterality: Right;   CYSTOSCOPY W/ RETROGRADES Right 07/21/2017   Procedure: CYSTOSCOPY WITH RETROGRADE PYELOGRAM;  Surgeon: Sebastian Ache, MD;  Location[facility];  Service: Urology;  Laterality: Right;   CYSTOSCOPY WITH RETROGRADE PYELOGRAM, URETEROSCOPY AND STENT PLACEMENT Left 08/26/2016   Procedure: CYSTOSCOPY WITH BILATERAL RETROGRADE PYELOGRAM,LEFT URETEROSCOPY WITH BIOPSY AND LEFT STENT PLACEMENT;  Surgeon: Sebastian Ache, MD;  Location: [facility];  Service: Urology;  Laterality: Left;   PORT-A-CATH REMOVAL Left 03/24/2021   Procedure: PORT-A-CATH REMOVAL;  Surgeon: Abigail Miyamoto, MD;  Location: [facility];  Service: General;  Laterality: Left;   PORTACATH PLACEMENT Left 06/18/2020   Procedure: INSERTION PORT-A-CATH WITH ULTRASOUND GUIDANCE;  Surgeon: Abigail Miyamoto, MD;  Location: [facility];  Service: General;  Laterality: Left;   ROBOT ASSITED LAPAROSCOPIC NEPHROURETERECTOMY Left 10/09/2016   Procedure: XI ROBOT ASSITED LAPAROSCOPIC NEPHROURETERECTOMY;  Surgeon: Sebastian Ache, MD;  Location: [facility];  Service: Urology;  Laterality: Left;   SURGERY ON FOOT FOR STAPH INFECTION  1989   TRANSURETHRAL RESECTION OF BLADDER TUMOR N/A 07/21/2017   Procedure: TRANSURETHRAL RESECTION OF BLADDER TUMOR (TURBT);  Surgeon: Sebastian Ache, MD;  Location: [facility];  Service: Urology;  Laterality: N/A;   TRANSURETHRAL RESECTION OF BLADDER TUMOR WITH MITOMYCIN-C N/A 06/09/2017   Procedure: TRANSURETHRAL RESECTION OF BLADDER TUMOR WITH MITOMYCIN-C;  Surgeon: Sebastian Ache, MD;  Location: [facility];  Service:  Urology;  Laterality: N/A;   TUBAL LIGATION  1980    Social History   Tobacco Use   Smoking status: Never   Smokeless tobacco: Never  Vaping Use   Vaping status: Never Used  Substance Use Topics   Alcohol use: Not Currently    Comment: , STOPPED   Drug use: No    Family History  Problem Relation Age of Onset   Cancer Mother    Hyperlipidemia Father    Heart attack Father    Diabetes Brother    Breast cancer Neg Hx    Sleep apnea Neg Hx     Allergies  Allergen Reactions   Other Other (See Comments)   Adhesive [Tape] Rash    PAPER TAPE ONLY to use   Chlorhexidine Gluconate Itching and Rash    Topical antiseptic (CHG WIPES)    Dilantin [Phenytoin] Rash   Penicillins Rash    Has patient had a PCN reaction causing immediate rash, facial/tongue/throat swelling, SOB or lightheadedness with hypotension:unsure Has patient had a PCN reaction causing severe rash involving mucus membranes or skin necrosis:unsure Has patient had a PCN reaction that required hospitalization:No Has patient had a PCN reaction occurring within the last 10 years:No If all of the above answers  are "NO", then may proceed with Cephalosporin use.    Sulfa Antibiotics Rash    Medication list has been reviewed and updated.  Current Outpatient Medications on File Prior to Visit  Medication Sig Dispense Refill   acetaminophen (TYLENOL) 500 MG tablet Take 500 mg by mouth at bedtime.      anastrozole (ARIMIDEX) 1 MG tablet Take 1 tablet (1 mg total) by mouth daily. 90 tablet 4   aspirin EC 81 MG tablet Take 1 tablet (81 mg total) by mouth daily. Swallow whole. 90 tablet 3   BD PEN NEEDLE NANO U/F 32G X 4 MM MISC USE FOR INSULIN ADMINISTRATION DAILY 100 each 3   Biotin 10 MG CAPS Take by mouth daily. 5000 mcg bid per pt 30 capsule    carvedilol (COREG) 3.125 MG tablet in the morning and at bedtime.     cetirizine (ZYRTEC) 10 MG tablet Take 10 mg by mouth every morning.     Continuous Blood Gluc Sensor  (FREESTYLE LIBRE 14 DAY SENSOR) MISC by Does not apply route.     HUMALOG KWIKPEN 100 UNIT/ML KwikPen Inject 7 Units into the skin 3 (three) times daily. Usually takes 6 units     insulin glargine (LANTUS SOLOSTAR) 100 UNIT/ML Solostar Pen Inject 26 Units into the skin every evening.     levothyroxine (SYNTHROID) 50 MCG tablet Take 1 tablet (50 mcg total) by mouth daily before breakfast. 90 tablet 1   OVER THE COUNTER MEDICATION Vitamin d 3 50 mcg daily     pravastatin (PRAVACHOL) 40 MG tablet Take 1 tablet (40 mg total) by mouth daily. 90 tablet 1   No current facility-administered medications on file prior to visit.    Review of Systems:  As per HPI- otherwise negative.   Physical Examination: Vitals:   05/03/23 0901  BP: 126/70  Pulse: 62  Resp: 18  Temp: 97.8 F (36.6 C)  SpO2: 97%   Vitals:   05/03/23 0901  Weight: 237 lb (107.5 kg)  Height: 5\' 8"  (1.727 m)   Body mass index is 36.04 kg/m. Ideal Body Weight: Weight in (lb) to have BMI = 25: 164.1  GEN: no acute distress.  Obese, looks well HEENT: Atraumatic, Normocephalic.  Ears and Nose: No external deformity. CV: RRR, No M/G/R. No JVD. No thrill. No extra heart sounds. PULM: CTA B, no wheezes, crackles, rhonchi. No retractions. No resp. distress. No accessory muscle use. ABD: S, NT, ND, +BS. No rebound. No HSM. EXTR: No c/c/e PSYCH: Normally interactive. Conversant.    Assessment and Plan: Estrogen deficiency - Plan: DG Bone Density  Hypothyroidism (acquired)  Mixed hyperlipidemia  Controlled type 2 diabetes mellitus with complication, with long-term current use of insulin (HCC)  Patient seen today for follow-up.  Ordered bone density to be done next month Diabetes is being managed by endocrinology, under adequate control Thyroid, lipids checked in February-patient prefers to recheck at next visit She continues to take pravastatin, levothyroxine Went over immunizations, she will see me in 4 to 6  months  Signed Abbe Amsterdam, MD

## 2023-05-03 ENCOUNTER — Ambulatory Visit (INDEPENDENT_AMBULATORY_CARE_PROVIDER_SITE_OTHER): Payer: Medicare Other | Admitting: Family Medicine

## 2023-05-03 VITALS — BP 126/70 | HR 62 | Temp 97.8°F | Resp 18 | Ht 68.0 in | Wt 237.0 lb

## 2023-05-03 DIAGNOSIS — E039 Hypothyroidism, unspecified: Secondary | ICD-10-CM | POA: Diagnosis not present

## 2023-05-03 DIAGNOSIS — Z794 Long term (current) use of insulin: Secondary | ICD-10-CM

## 2023-05-03 DIAGNOSIS — E782 Mixed hyperlipidemia: Secondary | ICD-10-CM | POA: Diagnosis not present

## 2023-05-03 DIAGNOSIS — E118 Type 2 diabetes mellitus with unspecified complications: Secondary | ICD-10-CM

## 2023-05-03 DIAGNOSIS — E2839 Other primary ovarian failure: Secondary | ICD-10-CM | POA: Diagnosis not present

## 2023-05-03 NOTE — Patient Instructions (Signed)
It was good to see you again today- please see me in 4-6 month for your next visit  I ordered your bone density to be done later this year here at the [facility]

## 2023-05-04 ENCOUNTER — Ambulatory Visit
Admission: RE | Admit: 2023-05-04 | Discharge: 2023-05-04 | Disposition: A | Discharge status: Home / Self Care | Payer: Medicare Other | Source: Ambulatory Visit | Attending: Family Medicine | Admitting: Family Medicine

## 2023-05-04 DIAGNOSIS — Z853 Personal history of malignant neoplasm of breast: Secondary | ICD-10-CM

## 2023-05-04 HISTORY — DX: Personal history of irradiation: Z92.3

## 2023-05-04 HISTORY — DX: Personal history of antineoplastic chemotherapy: Z92.21

## 2023-05-20 ENCOUNTER — Other Ambulatory Visit (HOSPITAL_COMMUNITY): Payer: Self-pay | Admitting: Urology

## 2023-05-20 ENCOUNTER — Ambulatory Visit (HOSPITAL_COMMUNITY)
Admission: RE | Admit: 2023-05-20 | Discharge: 2023-05-20 | Disposition: A | Discharge status: Home / Self Care | Payer: Medicare Other | Source: Ambulatory Visit | Attending: Urology | Admitting: Urology

## 2023-05-20 DIAGNOSIS — C678 Malignant neoplasm of overlapping sites of bladder: Secondary | ICD-10-CM | POA: Insufficient documentation

## 2023-06-01 ENCOUNTER — Ambulatory Visit: Payer: Medicare Other | Admitting: Internal Medicine

## 2023-06-07 ENCOUNTER — Inpatient Hospital Stay: Admission: RE | Admit: 2023-06-07 | Payer: Medicare Other | Source: Ambulatory Visit

## 2023-06-18 ENCOUNTER — Inpatient Hospital Stay: Payer: Medicare Other | Attending: Hematology and Oncology | Admitting: Hematology and Oncology

## 2023-06-18 ENCOUNTER — Encounter: Payer: Self-pay | Admitting: Hematology and Oncology

## 2023-06-18 DIAGNOSIS — Z808 Family history of malignant neoplasm of other organs or systems: Secondary | ICD-10-CM | POA: Diagnosis not present

## 2023-06-18 DIAGNOSIS — Z9221 Personal history of antineoplastic chemotherapy: Secondary | ICD-10-CM | POA: Diagnosis not present

## 2023-06-18 DIAGNOSIS — Z8553 Personal history of malignant neoplasm of renal pelvis: Secondary | ICD-10-CM | POA: Diagnosis not present

## 2023-06-18 DIAGNOSIS — Z170 Estrogen receptor positive status [ER+]: Secondary | ICD-10-CM | POA: Diagnosis not present

## 2023-06-18 DIAGNOSIS — Z79811 Long term (current) use of aromatase inhibitors: Secondary | ICD-10-CM | POA: Diagnosis not present

## 2023-06-18 DIAGNOSIS — C50411 Malignant neoplasm of upper-outer quadrant of right female breast: Secondary | ICD-10-CM | POA: Diagnosis not present

## 2023-06-18 DIAGNOSIS — Z8551 Personal history of malignant neoplasm of bladder: Secondary | ICD-10-CM | POA: Diagnosis not present

## 2023-06-18 DIAGNOSIS — Z905 Acquired absence of kidney: Secondary | ICD-10-CM | POA: Insufficient documentation

## 2023-06-18 DIAGNOSIS — Z90710 Acquired absence of both cervix and uterus: Secondary | ICD-10-CM | POA: Insufficient documentation

## 2023-06-18 NOTE — Progress Notes (Signed)
[facility]  Telephone:(336) [phone] Fax:([phone]) [phone]     ID: Tasha Tucker DOB: 1950-10-27  MR#: [id number]  XLK#:[id number]  Patient Care Team: [name], MD as PCP - General (Family Medicine) Wyline Mood Alben Spittle, MD as PCP - Cardiology (Cardiology) Berneice Heinrich Delbert Phenix., MD as Consulting Physician (Urology) Donnelly Angelica, RN as Oncology Nurse Navigator Pershing Proud, RN as Oncology Nurse Navigator Abigail Miyamoto, MD as Consulting Physician (General Surgery) Dorothy Puffer, MD as Consulting Physician (Radiation Oncology) Marlene Lard, MD as Consulting Physician (Endocrinology) Bufford Buttner, MD as Consulting Physician (Nephrology) Rachel Moulds, MD as Consulting Physician (Hematology and Oncology) Rachel Moulds, MD  CHIEF COMPLAINT: Estrogen receptor positive breast cancer  CURRENT TREATMENT: anastrozole   INTERVAL HISTORY:  Tasha Tucker returns today for follow up of her estrogen receptor positive breast cancer.  Discussed the use of AI scribe software for clinical note transcription with the patient, who gave verbal consent to proceed.  History of Present Illness    The patient, with a history of breast cancer, presents for a routine follow-up. She reports a single daily hot flash in the mid-morning, which she manages with ice. She denies any other Tucker effects from anastrozole and has not noticed any changes in her breast or overall health. She recently had a bladder scope and CT scan, which were both normal. She had a mammogram in August, which was also normal, and has a bone density test scheduled later this month. She also reports a decrease in physical activity due to hot weather, but plans to increase activity as the weather cools.        COVID 19 VACCINATION STATUS: fully vaccinated [employer]), with booster 04/2020; infection 02/2021   HISTORY OF CURRENT ILLNESS: From the original intake note:  Tasha Tucker had routine screening  mammography on 03/13/2020 showing a possible abnormality in the right breast. She underwent right diagnostic mammography with tomography and right breast ultrasonography at [facility] on 03/26/2020 showing: breast density category B; suspicious 1.9 cm mass/distortion in right breast at 9 o'clock; no right axillary lymphadenopathy.  Accordingly that same day, she proceeded to biopsy of the right breast area in question. The pathology from this procedure ([id number]) showed: invasive and in situ mammary carcinoma, grade 2, e-cadherin negative. Prognostic indicators significant for: estrogen receptor, 90% positive and progesterone receptor, 90% positive, both with strong staining intensity. Proliferation marker Ki67 at 5%. HER2 negative by immunohistochemistry (1+).  The patient's subsequent history is as detailed below.   PAST MEDICAL HISTORY: Past Medical History:  Diagnosis Date   Bladder cancer (HCC)    UROLOGIST- [facility]   Breast cancer (HCC) 2021   right lumpectomy and chemo and radiation done   DJD (degenerative joint disease)    NECK, NEEDS PILLOW WHEN LYING FLAT IS POSSIBLE   History of cerebral aneurysm repair    1996-- S/P CLAPPING RIGHT CAROTID --- NO DEFICITS   History of renal pelvis cancer    10-09-2016  S/P  LEFT RADICAL NEPHROURETERECTOMY -- HIGH GRADE PAPILLARY UROTHELIAL CARCINOMA   Hyperlipidemia    Hypertension    Liver hemangioma    yrs ago per pt on 03-19-2021   OA (osteoarthritis)    Personal history of chemotherapy    Personal history of radiation therapy    PONV (postoperative nausea and vomiting)    Type 2 diabetes mellitus (HCC)    UTI (urinary tract infection)    finished antibiotics 03-01-2021 all symptoms resolved   Wears  glasses 03/19/2021    PAST SURGICAL HISTORY: Past Surgical History:  Procedure Laterality Date   ABDOMINAL HYSTERECTOMY  1990   PARTIAL   BREAST LUMPECTOMY WITH RADIOACTIVE SEED AND SENTINEL LYMPH NODE BIOPSY Right 04/17/2020    Procedure: BREAST LUMPECTOMY X 2  WITH RADIOACTIVE SEED AND SENTINEL LYMPH NODE BIOPSY;  Surgeon: Abigail Miyamoto, MD;  Location: [facility];  Service: General;  Laterality: Right;   CATARACT EXTRACTION W/ INTRAOCULAR LENS  IMPLANT, BILATERAL  2016; 2017   CEREBRAL ANEURYSM REPAIR  1996   clipped-no deficits RIGHT CAROTID   CHOLECYSTECTOMY  2010   laparoscopic   CYSTOSCOPY W/ RETROGRADES Right 06/09/2017   Procedure: CYSTOSCOPY WITH RETROGRADE PYELOGRAM;  Surgeon: Sebastian Ache, MD;  Location: [facility];  Service: Urology;  Laterality: Right;   CYSTOSCOPY W/ RETROGRADES Right 07/21/2017   Procedure: CYSTOSCOPY WITH RETROGRADE PYELOGRAM;  Surgeon: Sebastian Ache, MD;  Location: [facility];  Service: Urology;  Laterality: Right;   CYSTOSCOPY WITH RETROGRADE PYELOGRAM, URETEROSCOPY AND STENT PLACEMENT Left 08/26/2016   Procedure: CYSTOSCOPY WITH BILATERAL RETROGRADE PYELOGRAM,LEFT URETEROSCOPY WITH BIOPSY AND LEFT STENT PLACEMENT;  Surgeon: Sebastian Ache, MD;  Location: [facility];  Service: Urology;  Laterality: Left;   PORT-A-CATH REMOVAL Left 03/24/2021   Procedure: PORT-A-CATH REMOVAL;  Surgeon: Abigail Miyamoto, MD;  Location[facility];  Service: General;  Laterality: Left;   PORTACATH PLACEMENT Left 06/18/2020   Procedure: INSERTION PORT-A-CATH WITH ULTRASOUND GUIDANCE;  Surgeon: Abigail Miyamoto, MD;  Location: [facility];  Service: General;  Laterality: Left;   ROBOT ASSITED LAPAROSCOPIC NEPHROURETERECTOMY Left 10/09/2016   Procedure: XI ROBOT ASSITED LAPAROSCOPIC NEPHROURETERECTOMY;  Surgeon: Sebastian Ache, MD;  Location: [facility];  Service: Urology;  Laterality: Left;   SURGERY ON FOOT FOR STAPH INFECTION  1989   TRANSURETHRAL RESECTION OF BLADDER TUMOR N/A 07/21/2017   Procedure: TRANSURETHRAL RESECTION OF BLADDER TUMOR (TURBT);  Surgeon: Sebastian Ache, MD;  [facility];  Service: Urology;   Laterality: N/A;   TRANSURETHRAL RESECTION OF BLADDER TUMOR WITH MITOMYCIN-C N/A 06/09/2017   Procedure: TRANSURETHRAL RESECTION OF BLADDER TUMOR WITH MITOMYCIN-C;  Surgeon: Sebastian Ache, MD;  Location: [facility];  Service: Urology;  Laterality: N/A;   TUBAL LIGATION  1980    FAMILY HISTORY: Family History  Problem Relation Age of Onset   Cancer Mother    Hyperlipidemia Father    Heart attack Father    Diabetes Brother    Breast cancer Neg Hx    Sleep apnea Neg Hx    Her father died at age 20 from ASCVD. Her mother died at age 63. She reports her mother had a tumor in her thigh at age 15, possibly a sarcoma. The patient has one brother, with no history of cancer. Her son, Tasha Tucker, had brain cancer (oligodedroglioma, possibly) and is doing well now at age 78 (as of 03/2020).   GYNECOLOGIC HISTORY:  No LMP recorded. Patient has had a hysterectomy. Menarche: 72 years old Age at first live birth: 72 years old GX P 3 LMP 1990 Contraceptive: used pills for 8 years without issues HRT never used  Hysterectomy? Yes, 1990 BSO? 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 Husband Tasha Tucker is a retired Technical sales engineer for the [employer]. She lives at home with husband Tasha Tucker and their cat, Tasha Tucker. Daughter Tasha Tucker, age 63, works as a Child psychotherapist in [place]. Son Tasha Tucker, age 59, works in  Immunologist in [place]. Son Tasha Tucker, age 52, lives in [place] and was previously a paramedic. Tasha Tucker has 4 grandchildren. She attends a [employer].     ADVANCED DIRECTIVES: In the absence of any documentation to the contrary, the patient's spouse is their HCPOA.    HEALTH MAINTENANCE: Social History   Tobacco Use   Smoking status: Never   Smokeless tobacco: Never  Vaping Use   Vaping status: Never Used  Substance Use Topics   Alcohol use: Not Currently    Comment: , STOPPED   Drug use: No      Colonoscopy: Cologuard 2020 was negative  PAP: date unknown, s/p hysterectomy  Bone density: 05/2021, +1.7   Allergies  Allergen Reactions   Other Other (See Comments)   Adhesive [Tape] Rash    PAPER TAPE ONLY to use   Chlorhexidine Gluconate Itching and Rash    Topical antiseptic (CHG WIPES)    Dilantin [Phenytoin] Rash   Penicillins Rash    Has patient had a PCN reaction causing immediate rash, facial/tongue/throat swelling, SOB or lightheadedness with hypotension:unsure Has patient had a PCN reaction causing severe rash involving mucus membranes or skin necrosis:unsure Has patient had a PCN reaction that required hospitalization:No Has patient had a PCN reaction occurring within the last 10 years:No If all of the above answers are "NO", then may proceed with Cephalosporin use.    Sulfa Antibiotics Rash    Current Outpatient Medications  Medication Sig Dispense Refill   acetaminophen (TYLENOL) 500 MG tablet Take 500 mg by mouth at bedtime.      anastrozole (ARIMIDEX) 1 MG tablet Take 1 tablet (1 mg total) by mouth daily. 90 tablet 4   aspirin EC 81 MG tablet Take 1 tablet (81 mg total) by mouth daily. Swallow whole. 90 tablet 3   BD PEN NEEDLE NANO U/F 32G X 4 MM MISC USE FOR INSULIN ADMINISTRATION DAILY 100 each 3   Biotin 10 MG CAPS Take by mouth daily. 5000 mcg bid per pt 30 capsule    carvedilol (COREG) 3.125 MG tablet in the morning and at bedtime.     cetirizine (ZYRTEC) 10 MG tablet Take 10 mg by mouth every morning.     Continuous Blood Gluc Sensor (FREESTYLE LIBRE 14 DAY SENSOR) MISC by Does not apply route.     HUMALOG KWIKPEN 100 UNIT/ML KwikPen Inject 7 Units into the skin 3 (three) times daily. Usually takes 6 units     insulin glargine (LANTUS SOLOSTAR) 100 UNIT/ML Solostar Pen Inject 26 Units into the skin every evening.     levothyroxine (SYNTHROID) 50 MCG tablet Take 1 tablet (50 mcg total) by mouth daily before breakfast. 90 tablet 1   OVER THE COUNTER  MEDICATION Vitamin d 3 50 mcg daily     pravastatin (PRAVACHOL) 40 MG tablet Take 1 tablet (40 mg total) by mouth daily. 90 tablet 1   No current facility-administered medications for this visit.    OBJECTIVE: White woman in no acute distress  Vitals:   06/18/23 0929  BP: (!) 156/62  Pulse: 73  Resp: 17  Temp: 97.9 F (36.6 C)  SpO2: 98%      Body mass index is 35.78 kg/m.   Wt Readings from Last 3 Encounters:  06/18/23 235 lb 4.8 oz (106.7 kg)  05/03/23 237 lb (107.5 kg)  03/22/23 228 lb (103.4 kg)      ECOG FS:1 - Symptomatic but completely ambulatory  Physical Exam Constitutional:  Appearance: Normal appearance.  Chest:     Comments: Bilateral breasts inspected.  No palpable masses or regional adenopathy.  Surgical distortion noted in the right breast Musculoskeletal:     Cervical back: Normal range of motion and neck supple. No rigidity.  Lymphadenopathy:     Cervical: No cervical adenopathy.  Neurological:     Mental Status: She is alert.      LAB RESULTS:  CMP     Component Value Date/Time   NA 137 04/21/2023 0000   K 4.6 04/21/2023 0000   CL 104 04/21/2023 0000   CL 104 04/21/2023 0000   CO2 25 (A) 04/21/2023 0000   GLUCOSE 171 (H) 05/04/2022 0923   BUN 26 (A) 04/21/2023 0000   CREATININE 1.6 (A) 04/21/2023 0000   CREATININE 1.6 (A) 04/21/2023 0000   CREATININE 1.80 (H) 05/04/2022 0923   CREATININE 1.78 (H) 06/19/2020 0842   CALCIUM 10.4 04/21/2023 0000   PROT 6.7 05/04/2022 0923   ALBUMIN 4.1 04/21/2023 0000   ALBUMIN 4.1 04/21/2023 0000   AST 25 05/04/2022 0923   AST 24 06/19/2020 0842   ALT 28 05/04/2022 0923   ALT 30 06/19/2020 0842   ALKPHOS 3.3 (A) 04/21/2023 0000   BILITOT 0.6 05/04/2022 0923   BILITOT 0.5 06/19/2020 0842   GFRNONAA 28 (L) 06/16/2021 1029   GFRNONAA 28 (L) 06/19/2020 0842   GFRAA 34 (L) 06/26/2020 1535   GFRAA 33 (L) 06/19/2020 0842    No results found for: "TOTALPROTELP", "ALBUMINELP", "A1GS", "A2GS",  "BETS", "BETA2SER", "GAMS", "MSPIKE", "SPEI"  Lab Results  Component Value Date   WBC 5.3 04/21/2023   NEUTROABS 3.9 06/16/2021   HGB 14.8 04/21/2023   HCT 44 04/21/2023   MCV 90.8 11/02/2022   PLT 137.0 (L) 11/02/2022    No results found for: "LABCA2"  No components found for: "ZOXWRU045"  No results for input(s): "INR" in the last 168 hours.  No results found for: "LABCA2"  No results found for: "CAN199"  No results found for: "CAN125"  No results found for: "CAN153"  No results found for: "CA2729"  No components found for: "HGQUANT"  No results found for: "CEA1", "CEA" / No results found for: "CEA1", "CEA"   No results found for: "AFPTUMOR"  No results found for: "CHROMOGRNA"  No results found for: "KPAFRELGTCHN", "LAMBDASER", "KAPLAMBRATIO" (kappa/lambda light chains)  No results found for: "HGBA", "HGBA2QUANT", "HGBFQUANT", "HGBSQUAN" (Hemoglobinopathy evaluation)   No results found for: "LDH"  Lab Results  Component Value Date   IRON 103 04/21/2023   TIBC 311 04/21/2023   IRONPCTSAT 33 04/21/2023   (Iron and TIBC)  Lab Results  Component Value Date   FERRITIN 346 04/21/2023    Urinalysis    Component Value Date/Time   COLORURINE YELLOW 08/06/2020 1414   APPEARANCEUR HAZY (A) 08/06/2020 1414   LABSPEC 1.009 08/06/2020 1414   PHURINE 6.0 08/06/2020 1414   GLUCOSEU NEGATIVE 08/06/2020 1414   HGBUR SMALL (A) 08/06/2020 1414   BILIRUBINUR negative 06/13/2021 0959   BILIRUBINUR negative 02/25/2021 1511   KETONESUR negative 06/13/2021 0959   KETONESUR NEGATIVE 08/06/2020 1414   PROTEINUR negative 06/13/2021 0959   PROTEINUR Negative 02/25/2021 1511   PROTEINUR NEGATIVE 08/06/2020 1414   UROBILINOGEN 0.2 06/13/2021 0959   UROBILINOGEN 1.0 04/29/2013 1455   NITRITE Negative 06/13/2021 0959   NITRITE negative 02/25/2021 1511   NITRITE NEGATIVE 08/06/2020 1414   LEUKOCYTESUR Small (1+) (A) 06/13/2021 0959   LEUKOCYTESUR LARGE (A) 08/06/2020  1414    STUDIES:  DG Chest 2 View  Result Date: 05/26/2023 CLINICAL DATA:  bladder cancer EXAM: CHEST - 2 VIEW COMPARISON:  05/25/2022. FINDINGS: Cardiac silhouette is unremarkable. No pneumothorax or pleural effusion. The lungs are clear. Aorta is calcified. The visualized skeletal structures are unremarkable. IMPRESSION: No acute cardiopulmonary process. Electronically Signed   By: Layla Maw M.D.   On: 05/26/2023 10:45     ELIGIBLE FOR AVAILABLE RESEARCH PROTOCOL: AET  ASSESSMENT: 72 y.o. [place] woman status post right breast upper outer quadrant biopsy 03/26/2020, for a clinical T1c N0, stage IA invasive lobular carcinoma, E-cadherin negative, grade 2, estrogen and progesterone receptor positive, with an MIB-1 of 5% and no HER-2 amplification  (a) unable to obtain breast MRI due to renal concern with contrast status post nephrectomy  (1) status post right lumpectomy and sentinel lymph node sampling 04/17/2020 for a pT2 pN0, stage IB invasive lobular carcinoma, grade 2, with negative margins  (a) a single right axillary lymph node was removed.  (2) Oncotype score of 29 predicts a risk of recurrence outside the breast within the next 9 years of 18% with antiestrogens only, and also a greater than 15% benefit from chemotherapy.  (3) chemotherapy consisting of cyclophosphamide, methotrexate and fluorouracil given every 21 days x 8, started 06/19/2020, completed 11/18/2020  (a) Udenyca added a day 3 beginning with cycle 2 secondary to treatment delays due to neutropenia  (4) adjuvant radiation 12/11/2020 through 01/08/2021 Site Technique Total Dose (Gy) Dose per Fx (Gy) Completed Fx Beam Energies  Breast, Right: Breast_Rt 3D 42.56/42.56 2.66 16/16 6X, 10X  Breast, Right: Breast_Rt_Bst specialPort 8/8 2 4/4 15E   (5) anastrozole started 04/03/2020 with anticipation of surgical delays  (a) bone density 03/30/2018 shows a T score of +1.5 (normal)  (B) bone density  05/29/2021 shows a T-score of +1.7   PLAN:  Assessment and Plan    Breast Cancer On Anastrozole with hot flashes as Tucker effect, managed with ice. No changes in breast or overall health. Recent mammogram in August 2024 was normal. -Continue Anastrozole. -Order mammogram for August 2025.  Bone Health Bone density test scheduled for June 21, 2023. -Review results of bone density test upon receipt and contact patient if any concerns arise.  General Health Maintenance Patient reports recent check-ups with other specialists, including a bladder scope and CT scan, with no issues identified. -Encourage increased physical activity, specifically walking. -Schedule follow-up appointment in one year.     Total encounter time 20 minutes.*  *Total Encounter Time as defined by the Centers for Medicare and Medicaid Services includes, in addition to the face-to-face time of a patient visit (documented in the note above) non-face-to-face time: obtaining and reviewing outside history, ordering and reviewing medications, tests or procedures, care coordination (communications with other health care professionals or caregivers) and documentation in the medical record.

## 2023-06-21 ENCOUNTER — Ambulatory Visit (HOSPITAL_BASED_OUTPATIENT_CLINIC_OR_DEPARTMENT_OTHER)
Admission: RE | Admit: 2023-06-21 | Discharge: 2023-06-21 | Disposition: A | Discharge status: Home / Self Care | Payer: Medicare Other | Source: Ambulatory Visit | Attending: Family Medicine | Admitting: Family Medicine

## 2023-06-21 ENCOUNTER — Encounter: Payer: Self-pay | Admitting: Family Medicine

## 2023-06-21 DIAGNOSIS — E2839 Other primary ovarian failure: Secondary | ICD-10-CM | POA: Insufficient documentation

## 2023-07-06 NOTE — Progress Notes (Unsigned)
Cardiology Office Note:    Date:  07/06/2023   ID:  Tasha Tucker, DOB [birth year], MRN [id number]  PCP:  [name], MD   [facility] Providers Cardiologist:  Maisie Fus, MD     Referring MD: [name], MD   No chief complaint on file. Elevated CAC  History of Present Illness:    Tasha Tucker is a 72 y.o. female with a hx of bladder CA/urothelial CA s/p nephrourectomy, breast CA diagnosed 2021 s/p RT and on anastrozole,  stage IV CKD, cerebral aneurysm s/p clipping 1996, hypothyroidism, HTN, DM2 referral for an elevated CAC.  Here total CAC is 586 which is 91st percentile. Predominantly in the RCA.   She underwent a CT scan and noted her primary heard something abnormal on her exam. She is asymptomatic. She denies chest pressure and SOB. She does some walking. No cardiology testing before. She takes pravastatin.  Brother is in heart failure, he is on dialysis and sees Dr. Shirlee Latch  Non Smoker  Past Medical History:  Diagnosis Date   Bladder cancer (HCC)    UROLOGIST- [facility]   Breast cancer (HCC) 2021   right lumpectomy and chemo and radiation done   DJD (degenerative joint disease)    NECK, NEEDS PILLOW WHEN LYING FLAT IS POSSIBLE   History of cerebral aneurysm repair    1996-- S/P CLAPPING RIGHT CAROTID --- NO DEFICITS   History of renal pelvis cancer    10-09-2016  S/P  LEFT RADICAL NEPHROURETERECTOMY -- HIGH GRADE PAPILLARY UROTHELIAL CARCINOMA   Hyperlipidemia    Hypertension    Liver hemangioma    yrs ago per pt on 03-19-2021   OA (osteoarthritis)    Personal history of chemotherapy    Personal history of radiation therapy    PONV (postoperative nausea and vomiting)    Type 2 diabetes mellitus (HCC)    UTI (urinary tract infection)    finished antibiotics 03-01-2021 all symptoms resolved   Wears glasses 03/19/2021    Past Surgical History:  Procedure Laterality Date   ABDOMINAL HYSTERECTOMY  1990   PARTIAL   BREAST  LUMPECTOMY WITH RADIOACTIVE SEED AND SENTINEL LYMPH NODE BIOPSY Right 04/17/2020   Procedure: BREAST LUMPECTOMY X 2  WITH RADIOACTIVE SEED AND SENTINEL LYMPH NODE BIOPSY;  Surgeon: Abigail Miyamoto, MD;  Location: [facility];  Service: General;  Laterality: Right;   CATARACT EXTRACTION W/ INTRAOCULAR LENS  IMPLANT, BILATERAL  2016; 2017   CEREBRAL ANEURYSM REPAIR  1996   clipped-no deficits RIGHT CAROTID   CHOLECYSTECTOMY  2010   laparoscopic   CYSTOSCOPY W/ RETROGRADES Right 06/09/2017   Procedure: CYSTOSCOPY WITH RETROGRADE PYELOGRAM;  Surgeon: Sebastian Ache, MD;  Location: [facility];  Service: Urology;  Laterality: Right;   CYSTOSCOPY W/ RETROGRADES Right 07/21/2017   Procedure: CYSTOSCOPY WITH RETROGRADE PYELOGRAM;  Surgeon: Sebastian Ache, MD;  Location: [facility];  Service: Urology;  Laterality: Right;   CYSTOSCOPY WITH RETROGRADE PYELOGRAM, URETEROSCOPY AND STENT PLACEMENT Left 08/26/2016   Procedure: CYSTOSCOPY WITH BILATERAL RETROGRADE PYELOGRAM,LEFT URETEROSCOPY WITH BIOPSY AND LEFT STENT PLACEMENT;  Surgeon: Sebastian Ache, MD;  [facility];  Service: Urology;  Laterality: Left;   PORT-A-CATH REMOVAL Left 03/24/2021   Procedure: PORT-A-CATH REMOVAL;  Surgeon: Abigail Miyamoto, MD;  Location: [facility];  Service: General;  Laterality: Left;   PORTACATH PLACEMENT Left 06/18/2020   Procedure: INSERTION PORT-A-CATH WITH ULTRASOUND GUIDANCE;  Surgeon: Abigail Miyamoto, MD;  Location: [facility]  OR;  Service: General;  Laterality: Left;   ROBOT ASSITED LAPAROSCOPIC NEPHROURETERECTOMY Left 10/09/2016   Procedure: XI ROBOT ASSITED LAPAROSCOPIC NEPHROURETERECTOMY;  Surgeon: Sebastian Ache, MD;  Location: [facility];  Service: Urology;  Laterality: Left;   SURGERY ON FOOT FOR STAPH INFECTION  1989   TRANSURETHRAL RESECTION OF BLADDER TUMOR N/A 07/21/2017   Procedure: TRANSURETHRAL RESECTION OF BLADDER TUMOR (TURBT);  Surgeon:  Sebastian Ache, MD;  Location: [facility];  Service: Urology;  Laterality: N/A;   TRANSURETHRAL RESECTION OF BLADDER TUMOR WITH MITOMYCIN-C N/A 06/09/2017   Procedure: TRANSURETHRAL RESECTION OF BLADDER TUMOR WITH MITOMYCIN-C;  Surgeon: Sebastian Ache, MD;  Location: [facility];  Service: Urology;  Laterality: N/A;   TUBAL LIGATION  1980    Current Medications: Current Outpatient Medications on File Prior to Visit  Medication Sig Dispense Refill   acetaminophen (TYLENOL) 500 MG tablet Take 500 mg by mouth at bedtime.      anastrozole (ARIMIDEX) 1 MG tablet Take 1 tablet (1 mg total) by mouth daily. 90 tablet 4   aspirin EC 81 MG tablet Take 1 tablet (81 mg total) by mouth daily. Swallow whole. 90 tablet 3   BD PEN NEEDLE NANO U/F 32G X 4 MM MISC USE FOR INSULIN ADMINISTRATION DAILY 100 each 3   Biotin 10 MG CAPS Take by mouth daily. 5000 mcg bid per pt 30 capsule    carvedilol (COREG) 3.125 MG tablet in the morning and at bedtime.     cetirizine (ZYRTEC) 10 MG tablet Take 10 mg by mouth every morning.     Continuous Blood Gluc Sensor (FREESTYLE LIBRE 14 DAY SENSOR) MISC by Does not apply route.     HUMALOG KWIKPEN 100 UNIT/ML KwikPen Inject 7 Units into the skin 3 (three) times daily. Usually takes 6 units     insulin glargine (LANTUS SOLOSTAR) 100 UNIT/ML Solostar Pen Inject 26 Units into the skin every evening.     levothyroxine (SYNTHROID) 50 MCG tablet Take 1 tablet (50 mcg total) by mouth daily before breakfast. 90 tablet 1   OVER THE COUNTER MEDICATION Vitamin d 3 50 mcg daily     pravastatin (PRAVACHOL) 40 MG tablet Take 1 tablet (40 mg total) by mouth daily. 90 tablet 1   No current facility-administered medications on file prior to visit.     Allergies:   Other, Adhesive [tape], Chlorhexidine gluconate, Dilantin [phenytoin], Penicillins, and Sulfa antibiotics   Social History   Socioeconomic History   Marital status: Married    Spouse name:  Not on file   Number of children: Not on file   Years of education: Not on file   Highest education level: Bachelor's degree (e.g., BA, AB, BS)  Occupational History   Not on file  Tobacco Use   Smoking status: Never   Smokeless tobacco: Never  Vaping Use   Vaping status: Never Used  Substance and Sexual Activity   Alcohol use: Not Currently    Comment: , STOPPED   Drug use: No   Sexual activity: Yes  Other Topics Concern   Not on file  Social History Narrative   Not on file   Social Determinants of Health   Financial Resource Strain: Low Risk  (02/09/2023)   Overall Financial Resource Strain (CARDIA)    Difficulty of Paying Living Expenses: Not hard at all  Food Insecurity: No Food Insecurity (02/09/2023)   Hunger Vital Sign    Worried About Running Out of Food in the Last Year:  Never true    Ran Out of Food in the Last Year: Never true  Transportation Needs: No Transportation Needs (02/09/2023)   PRAPARE - Administrator, Civil Service (Medical): No    Lack of Transportation (Non-Medical): No  Physical Activity: Insufficiently Active (02/09/2023)   Exercise Vital Sign    Days of Exercise per Week: 3 days    Minutes of Exercise per Session: 40 min  Stress: No Stress Concern Present (02/09/2023)   [employer] of Occupational Health - Occupational Stress Questionnaire    Feeling of Stress : Not at all  Social Connections: Socially Integrated (02/09/2023)   Social Connection and Isolation Panel [NHANES]    Frequency of Communication with Friends and Family: More than three times a week    Frequency of Social Gatherings with Friends and Family: Once a week    Attends Religious Services: More than 4 times per year    Active Member of [employer] or Organizations: Yes    Attends Engineer, structural: More than 4 times per year    Marital Status: Married     Family History: The patient's family history includes Cancer in her mother; Diabetes in her brother;  Heart attack in her father; Hyperlipidemia in her father. There is no history of Breast cancer or Sleep apnea.  ROS:   Please see the history of present illness.     All other systems reviewed and are negative.  EKGs/Labs/Other Studies Reviewed:    The following studies were reviewed today:   EKG:  EKG is  ordered today.  The ekg ordered today demonstrates   12/23/2022- NSR with PVCs  TTE 01/13/2023 EF 60-65%, RV fxn is normal No valve dx  Recent Labs: 11/02/2022: Platelets 137.0; TSH 1.72 04/21/2023: BUN 26; Creatinine 1.6; Creatinine 1.6; Hemoglobin 14.8; Potassium 4.6; Sodium 137   Recent Lipid Panel    Component Value Date/Time   CHOL 152 11/02/2022 0935   TRIG 191.0 (H) 11/02/2022 0935   HDL 52.60 11/02/2022 0935   CHOLHDL 3 11/02/2022 0935   VLDL 38.2 11/02/2022 0935   LDLCALC 61 11/02/2022 0935   LDLDIRECT 86.0 04/30/2021 0929     Risk Assessment/Calculations:    Physical Exam:    VS:   ***   Wt Readings from Last 3 Encounters:  06/18/23 235 lb 4.8 oz (106.7 kg)  05/03/23 237 lb (107.5 kg)  03/22/23 228 lb (103.4 kg)     GEN:  Well nourished, well developed in no acute distress HEENT: Normal NECK: No JVD; No carotid bruits LYMPHATICS: No lymphadenopathy CARDIAC: RRR, no murmurs, rubs, gallops RESPIRATORY:  Clear to auscultation without rales, wheezing or rhonchi  ABDOMEN: Soft, non-tender, non-distended MUSCULOSKELETAL:  No edema; No deformity  SKIN: Warm and dry NEUROLOGIC:  Alert and oriented x 3 PSYCHIATRIC:  Normal affect   ASSESSMENT:    Elevated CAC Reports no symptoms.  Will start with an echo. Discussed that if she has progressive CP or SOB to seek medical attention.  - had prior normal echo -LDL is at goal 61 mg/dL, continue pravastatin 40 mg daily - cont  asa 81 mg daily PLAN:    In order of problems listed above:  *** Follow up in 6 months      Medication Adjustments/Labs and Tests Ordered: Current medicines are reviewed at  length with the patient today.  Concerns regarding medicines are outlined above.  No orders of the defined types were placed in this encounter.  No orders of the  defined types were placed in this encounter.   There are no Patient Instructions on file for this visit.   Signed, Maisie Fus, MD  07/06/2023 10:47 AM    [facility]

## 2023-07-07 ENCOUNTER — Encounter: Payer: Self-pay | Admitting: Internal Medicine

## 2023-07-07 ENCOUNTER — Ambulatory Visit: Payer: Medicare Other | Attending: Internal Medicine | Admitting: Internal Medicine

## 2023-07-07 VITALS — BP 126/70 | HR 81 | Ht 68.0 in | Wt 232.4 lb

## 2023-07-07 DIAGNOSIS — I671 Cerebral aneurysm, nonruptured: Secondary | ICD-10-CM

## 2023-07-07 NOTE — Patient Instructions (Signed)
Medication Instructions:  No changes *If you need a refill on your cardiac medications before your next appointment, please call your pharmacy*  Follow-Up: At [facility], you and your health needs are our priority.  As part of our continuing mission to provide you with exceptional heart care, we have created designated Provider Care Teams.  These Care Teams include your primary Cardiologist (physician) and Advanced Practice Providers (APPs -  Physician Assistants and Nurse Practitioners) who all work together to provide you with the care you need, when you need it.  We recommend signing up for the patient portal called "MyChart".  Sign up information is provided on this After Visit Summary.  MyChart is used to connect with patients for Virtual Visits (Telemedicine).  Patients are able to view lab/test results, encounter notes, upcoming appointments, etc.  Non-urgent messages can be sent to your provider as well.   To learn more about what you can do with MyChart, go to [email or web address].    Your next appointment:   1 year(s)  Provider:   Branch, Mary E, MD      

## 2023-07-08 ENCOUNTER — Other Ambulatory Visit: Payer: Self-pay | Admitting: Family Medicine

## 2023-07-08 DIAGNOSIS — E039 Hypothyroidism, unspecified: Secondary | ICD-10-CM

## 2023-07-12 ENCOUNTER — Ambulatory Visit (INDEPENDENT_AMBULATORY_CARE_PROVIDER_SITE_OTHER): Payer: Medicare Other | Admitting: Neurology

## 2023-07-12 ENCOUNTER — Other Ambulatory Visit: Payer: Self-pay | Admitting: Family Medicine

## 2023-07-12 DIAGNOSIS — R351 Nocturia: Secondary | ICD-10-CM

## 2023-07-12 DIAGNOSIS — G4733 Obstructive sleep apnea (adult) (pediatric): Secondary | ICD-10-CM

## 2023-07-12 DIAGNOSIS — E039 Hypothyroidism, unspecified: Secondary | ICD-10-CM

## 2023-07-12 DIAGNOSIS — G4720 Circadian rhythm sleep disorder, unspecified type: Secondary | ICD-10-CM

## 2023-07-12 DIAGNOSIS — Z9189 Other specified personal risk factors, not elsewhere classified: Secondary | ICD-10-CM

## 2023-07-12 DIAGNOSIS — R0681 Apnea, not elsewhere classified: Secondary | ICD-10-CM

## 2023-07-12 DIAGNOSIS — G4761 Periodic limb movement disorder: Secondary | ICD-10-CM

## 2023-07-12 DIAGNOSIS — E66811 Obesity, class 1: Secondary | ICD-10-CM

## 2023-07-12 DIAGNOSIS — R0683 Snoring: Secondary | ICD-10-CM

## 2023-07-13 NOTE — Procedures (Unsigned)
Physician Interpretation:    Referred by: [name], MD    History and Indication for Testing: 72 year old female with an underlying medical history of breast cancer with status post right lumpectomy and chemo-radiation, degenerative joint disease, cerebral aneurysm repair, hypertension, hyperlipidemia, osteoarthritis, liver hemangioma, type 2 diabetes, bladder cancer with status post left radical nephroureterectomy in 2019, and obesity, who reports snoring and witnessed apneas per husband's observation.  Her Epworth sleepiness score is 3 out of 24, fatigue severity score is 26 out of 63.    EEG: Review of the EEG showed no abnormal electrical discharges and symmetrical bihemispheric findings.     EKG: The EKG revealed normal sinus rhythm (NSR). ***   AUDIO/VIDEO REVIEW: The audio and video review did not show any abnormal or unusual behaviors, movements, phonations or vocalizations. The patient took 2 restroom breaks. Snoring was noted, in the mild to moderate range.   POST-STUDY QUESTIONNAIRE: Post study, the patient indicated, that sleep was worse than the same as usual.    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 Treatment with a positive airway pressure (PAP) device is recommended.  The patient will be advised to proceed with home autoPAP therapy for now.  A laboratory attended, full night PAP-titration study can be considered down the road, to optimize therapy settings, mask fit, monitoring of tolerance and of proper oxygen saturations. Other treatment options may be limited, and may include  (generally speaking) surgical options in selected patients. *** Concomitant weight loss is highly recommended. Please note that untreated obstructive sleep apnea may carry additional perioperative morbidity. Patients with significant obstructive sleep apnea should receive perioperative PAP therapy and the surgeons and particularly the anesthesiologist should be informed of the diagnosis and the severity of the sleep disordered breathing. This study shows sleep fragmentation and abnormal sleep stage percentages; these are nonspecific findings and per se do not signify an intrinsic sleep disorder or a cause for the patient's sleep-related symptoms. Causes include (but are not limited to) the first night effect of the sleep study, circadian rhythm disturbances, medication effect or an underlying mood disorder or medical problem.  The patient should be cautioned not to drive, work at heights, or operate dangerous or heavy equipment when tired or sleepy. Review and reiteration of good sleep hygiene measures should be pursued with any patient. The patient will be seen in follow-up in the sleep clinic at [facility] for discussion of the test results, symptom and treatment compliance review, further management strategies, etc. The patient and the referring provider will be notified of the test results.      I certify that I have reviewed the entire raw data recording prior to the issuance of this report in accordance with the Standards of Accreditation of the American Academy of Sleep Medicine (AASM).   Huston Foley, MD, PhD Medical Director, [facility]) Diplomat, ABPN (Neurology and Sleep)   Technical Report:   General Information  Name: Tasha Tucker, Tasha Tucker BMI: 16.10 Physician: Huston Foley, MD  ID: [id number] Height: 68.0 in Technician: Margaretann Loveless, RPSGT  Sex: Female Weight: 228.0 lb Record: [id number]  Age: 38 [1951-08-17] Date: 07/12/2023  Medical & Medication  History    72 year old female with an underlying medical history of breast cancer with status post right lumpectomy and chemo-radiation, degenerative joint disease, cerebral aneurysm repair, hypertension, hyperlipidemia, osteoarthritis, liver hemangioma, type 2 diabetes, bladder cancer with status post left radical nephroureterectomy in 2019, and obesity, who reports snoring and witnessed apneas per husband's observation. Tylenol, Arimidex, Aspirin, Biotin, Coreg, Zyrtec, Humalog, Synthroid, Pravachol, Lantus Solostar   Sleep Disorder      Comments   The patient came into the sleep lab for a PSG. The patient took Tylenol and Lantus prior to start of study. Two restroom breaks. EKG did not show any obvious cardiac arrhythmias. Mild to moderate snoring. Respiratory events scored with a 4% desat. PLM's noted. The patient slept supine and lateral. Respiratory events seemed worse while supine. The patient had oral breathing. AHI was 54.1 after 2 hrs of TST. The patient was not split due to 2 hrs of TST not being achieved until after 1:30am. A few periods of WASO.  Late REM latency. Sleep efficiency was 45.70%.     Lights out: 08:48:59 PM Lights on: 05:09:20 AM   Time Total Supine Side Prone Upright  Recording (TRT) 8h 20.75m 4h 28.7m 3h 52.27m 0h 0.7m 0h 0.31m  Sleep (TST) 3h 48.87m 2h 16.9m 1h 32.32m 0h 0.73m 0h 0.26m   Latency N1 N2 N3 REM Onset Per. Slp. Eff.  Actual 0h 0.36m 0h 27.61m 0h 0.43m 6h 42.16m 1h 5.51m 2h 8.43m 45.70%   Stg Dur Wake N1 N2 N3 REM  Total 271.5 30.0 168.0 0.0 30.5  Supine 132.0 23.0 113.0 0.0 0.0  Side 139.5 7.0 55.0 0.0 30.5  Prone 0.0 0.0 0.0 0.0 0.0  Upright 0.0 0.0 0.0 0.0 0.0   Stg % Wake N1 N2 N3 REM  Total 54.3 13.1 73.5 0.0 13.3  Supine 26.4 10.1 49.5 0.0 0.0  Side 27.9 3.1 24.1 0.0 13.3  Prone 0.0 0.0 0.0 0.0 0.0  Upright 0.0 0.0 0.0 0.0 0.0     Apnea Summary Sub Supine Side Prone Upright  Total 11 Total 11 10 1  0 0    REM 0 0 0 0 0    NREM 11 10 1  0 0  Obs 11  REM 0 0 0 0 0    NREM 11 10 1  0 0  Mix 0 REM 0 0 0 0 0    NREM 0 0 0 0 0  Cen 0 REM 0 0 0 0 0    NREM 0 0 0 0 0   Rera Summary Sub Supine Side Prone Upright  Total 0 Total 0 0 0 0 0    REM 0 0 0 0 0    NREM 0 0 0 0 0   Hypopnea Summary Sub Supine Side Prone Upright  Total 155 Total 155 134 21 0 0    REM 0 0 0 0 0    NREM 155 134 21 0 0   4% Hypopnea Summary Sub Supine Side Prone Upright  Total (4%) 155 Total 155 134 21 0 0    REM 0 0 0 0 0    NREM 155 134 21 0 0     AHI Total Obs Mix Cen  43.59 Apnea 2.89 2.89 0.00 0.00   Hypopnea 40.70 -- -- --  43.59 Hypopnea (4%) 40.70 -- -- --    Total Supine Side Prone Upright  Position AHI 43.59 63.53 14.27 0.00 0.00  REM AHI 0.00   NREM  AHI 50.30   Position RDI 43.59 63.53 14.27 0.00 0.00  REM RDI 0.00   NREM RDI 50.30    4% Hypopnea Total Supine Side Prone Upright  Position AHI (4%) 43.59 63.53 14.27 0.00 0.00  REM AHI (4%) 0.00   NREM AHI (4%) 50.30   Position RDI (4%) 43.59 63.53 14.27 0.00 0.00  REM RDI (4%) 0.00   NREM RDI (4%) 50.30    Desaturation Information Threshold: 2% <100% <90% <80% <70% <60% <50% <40%  Supine 319.0 63.0 0.0 0.0 0.0 0.0 0.0  Side 268.0 20.0 0.0 0.0 0.0 0.0 0.0  Prone 0.0 0.0 0.0 0.0 0.0 0.0 0.0  Upright 0.0 0.0 0.0 0.0 0.0 0.0 0.0  Total 587.0 83.0 0.0 0.0 0.0 0.0 0.0  Index 81.1 11.5 0.0 0.0 0.0 0.0 0.0   Threshold: 3% <100% <90% <80% <70% <60% <50% <40%  Supine 268.0 63.0 0.0 0.0 0.0 0.0 0.0  Side 211.0 20.0 0.0 0.0 0.0 0.0 0.0  Prone 0.0 0.0 0.0 0.0 0.0 0.0 0.0  Upright 0.0 0.0 0.0 0.0 0.0 0.0 0.0  Total 479.0 83.0 0.0 0.0 0.0 0.0 0.0  Index 66.1 11.5 0.0 0.0 0.0 0.0 0.0   Threshold: 4% <100% <90% <80% <70% <60% <50% <40%  Supine 209.0 63.0 0.0 0.0 0.0 0.0 0.0  Side 150.0 20.0 0.0 0.0 0.0 0.0 0.0  Prone 0.0 0.0 0.0 0.0 0.0 0.0 0.0  Upright 0.0 0.0 0.0 0.0 0.0 0.0 0.0  Total 359.0 83.0 0.0 0.0 0.0 0.0 0.0  Index 49.6 11.5 0.0 0.0 0.0 0.0 0.0   Threshold: 3% <100% <90% <80% <70%  <60% <50% <40%  Supine 268 63 0 0 0 0 0  Side 211 20 0 0 0 0 0  Prone 0 0 0 0 0 0 0  Upright 0 0 0 0 0 0 0  Total 479 83 0 0 0 0 0   Awakening/Arousal Information # of Awakenings 40  Wake after sleep onset 206.28m  Wake after persistent sleep 162.72m   Arousal Assoc. Arousals Index  Apneas 10 2.6  Hypopneas 86 22.6  Leg Movements 2 0.5  Snore 0 0.0  PTT Arousals 0 0.0  Spontaneous 53 13.9  Total 149 39.1  Leg Movement Information PLMS LMs Index  Total LMs during PLMS 113 29.7  LMs w/ Microarousals 0 0.0   LM LMs Index  w/ Microarousal 2 0.5  w/ Awakening 1 0.3  w/ Resp Event 4 1.1  Spontaneous 20 5.3  Total 25 6.6     Desaturation threshold setting: 3% Minimum desaturation setting: 10 seconds SaO2 nadir: 81% The longest event was a 55 sec obstructive Hypopnea with a minimum SaO2 of 85%. The lowest SaO2 was 81% associated with a 50 sec obstructive Hypopnea. EKG Rates EKG Avg Max Min  Awake 64 98 57  Asleep 62 79 56  EKG Events: N/A

## 2023-07-14 NOTE — Addendum Note (Signed)
Addended by: Huston Foley on: 07/14/2023 06:13 PM   Modules accepted: Orders

## 2023-07-15 ENCOUNTER — Telehealth: Payer: Self-pay | Admitting: *Deleted

## 2023-07-15 NOTE — Telephone Encounter (Signed)
-----   Message from Huston Foley sent at 07/14/2023  6:13 PM EDT ----- Urgent set up requested on PAP therapy, due to severe OSA. Patient referred by PCP, seen by me on 03/22/23, diagnostic PSG on 07/11/22.    Please call and notify the patient that the recent sleep study showed severe obstructive sleep apnea. I recommend treatment for in the form of autoPAP. We may consider at a CPAP titration study at a later date, if need be, which means, that we would ask her to come back in for a second sleep study with CPAP treatment. For now, I would like to start her on a so-called autoPAP machine at home, through a DME company (of her choice, or as per insurance requirement). The DME representative will educate her on how to use the machine, how to put the mask on, etc. I have placed an order in the chart. Please send referral, talk to patient, send report to referring MD. We will need a FU in sleep clinic for 10 weeks post-PAP set up, please arrange that with me or one of our NPs. Thanks,   Huston Foley, MD, PhD [facility])

## 2023-07-15 NOTE — Telephone Encounter (Signed)
Spoke to pt gave sleep study result Pt aware she is urgent set up due to severe OSA Pt chose [facility] has DME  Gave  pt [employer] #. Pt aware of insurance compliance Sent community message to [facility]  about orders ready and sent sleep study results to PCP Pt expressed understanding and thanked me for calling

## 2023-08-13 ENCOUNTER — Other Ambulatory Visit: Payer: Self-pay | Admitting: Family Medicine

## 2023-08-13 DIAGNOSIS — E782 Mixed hyperlipidemia: Secondary | ICD-10-CM

## 2023-10-13 ENCOUNTER — Encounter: Payer: Self-pay | Admitting: Neurology

## 2023-10-13 ENCOUNTER — Ambulatory Visit (INDEPENDENT_AMBULATORY_CARE_PROVIDER_SITE_OTHER): Payer: Medicare Other | Admitting: Neurology

## 2023-10-13 VITALS — BP 141/76 | HR 68 | Ht 68.0 in | Wt 234.4 lb

## 2023-10-13 DIAGNOSIS — G4733 Obstructive sleep apnea (adult) (pediatric): Secondary | ICD-10-CM | POA: Diagnosis not present

## 2023-10-13 DIAGNOSIS — R0981 Nasal congestion: Secondary | ICD-10-CM | POA: Diagnosis not present

## 2023-10-13 DIAGNOSIS — Z789 Other specified health status: Secondary | ICD-10-CM | POA: Diagnosis not present

## 2023-10-13 NOTE — Patient Instructions (Signed)
 Please continue with your AutoPap for now.  We will try to bring you in for a designated sleep study for CPAP titration. Consider using saline rinse to reduce your nasal congestion such as Neti pot.  Try the nasal mask provided as a sample today.

## 2023-10-13 NOTE — Progress Notes (Signed)
 Subjective:    Patient ID: Tasha Tucker is a 73 y.o. female.  HPI    Interim history:   Ms. Tasha Tucker is a 73 year old female with an underlying medical history of breast cancer with status post right lumpectomy and chemo-radiation, degenerative joint disease, cerebral aneurysm repair, hypertension, hyperlipidemia, osteoarthritis, liver hemangioma, type 2 diabetes, bladder cancer with status post left radical nephroureterectomy in 2019, and obesity, who presents for follow-up consultation of her obstructive sleep apnea after interim testing and starting home AutoPap therapy.  The patient is unaccompanied today.  I first met her at the request of her primary care physician on 03/22/2023, at which time she reported snoring and witnessed apneas per husband's observation.  She was advised to proceed with a sleep study.  She had a baseline polysomnogram through our sleep lab on 07/12/2023 which showed severe obstructive sleep apnea with an AHI of 43.6/h, O2 nadir 81%.  She had moderate PLM's without significant arousals.  She was advised to proceed with home AutoPap therapy.  Set up date was 07/21/2023.  She has a ResMed air sense 11 AutoSet machine.  Her DME provider is [facility].  Today, 10/13/2023: I reviewed her AutoPap compliance data from 07/21/2023 through 08/19/2023, which is a total of 30 days, during which time she used her machine 29 days with percent use days greater than 4 hours at 73%, indicating adequate compliance with an average usage of 5 hours and 2 minutes for days of treatment, residual AHI elevated at 26.8/h with mild increase in central events noted.  95th percentile pressure at 10.8 cm with a range of 6 to 11 cm with EPR of 3.  Leak low with the 95th percentile at 2.2 L/min.  She reports difficulty with tolerance of her AutoPap.  She started with a fullface mask which was tolerable to use but she woke up with severe dry mouth and dry lips.  She is now on a nasal mask, small sized  Eson 2 from [employer], this mask cuts into her nose and is uncomfortable.  She was given a chinstrap as well.  She feels it difficult to continue with AutoPap therapy but is willing to continue to try.  She is humidifier set at them but then water  accumulated in the hose.  She was compliant in the first month but current month shows much less usage, average usage of 2 hours and 31 minutes, percent use days greater than 4 hours of 13%.   Previously:   03/22/2023: (She) reports snoring and witnessed apneas per husband's observation.  Her Epworth sleepiness score is 3 out of 24, fatigue severity score is 26 out of 63.  She reports that her husband has sleep apnea and uses a PAP machine.  He feels like she may need 1.  I reviewed your office note from 02/10/2023.  She goes to bed around 9 and rise time is around 6:30 AM or 7 AM.  She is retired, she used to work as a Administrator, arts.  They have 3 grown children and 3 grandchildren.  Her weight has been more or less stable.  She denies nocturnal or morning headaches but has nocturia about 2-3 times per average night.  She is not aware of any family history of sleep apnea.  She is a non-smoker and drinks alcohol very occasionally, on special occasions, she drinks caffeine in the form of coffee, about 2 cups in the mornings.  She does not have a TV in her bedroom.  They have 1 cat in the household and the cat occasionally sleeps in the room at night.  The patient's allergies, current medications, family history, past medical history, past social history, past surgical history and problem list were reviewed and updated as appropriate.    Her Past Medical History Is Significant For: Past Medical History:  Diagnosis Date   Bladder cancer (HCC)    UROLOGIST- [facility]   Breast cancer (HCC) 2021   right lumpectomy and chemo and radiation done   DJD (degenerative joint disease)    NECK, NEEDS PILLOW WHEN LYING FLAT IS POSSIBLE   History of  cerebral aneurysm repair    1996-- S/P CLAPPING RIGHT CAROTID --- NO DEFICITS   History of renal pelvis cancer    10-09-2016  S/P  LEFT RADICAL NEPHROURETERECTOMY -- HIGH GRADE PAPILLARY UROTHELIAL CARCINOMA   Hyperlipidemia    Hypertension    Liver hemangioma    yrs ago per pt on 03-19-2021   OA (osteoarthritis)    Personal history of chemotherapy    Personal history of radiation therapy    PONV (postoperative nausea and vomiting)    Type 2 diabetes mellitus (HCC)    UTI (urinary tract infection)    finished antibiotics 03-01-2021 all symptoms resolved   Wears glasses 03/19/2021    Her Past Surgical History Is Significant For: Past Surgical History:  Procedure Laterality Date   ABDOMINAL HYSTERECTOMY  1990   PARTIAL   BREAST LUMPECTOMY WITH RADIOACTIVE SEED AND SENTINEL LYMPH NODE BIOPSY Right 04/17/2020   Procedure: BREAST LUMPECTOMY X 2  WITH RADIOACTIVE SEED AND SENTINEL LYMPH NODE BIOPSY;  Surgeon: Oza Blumenthal, MD;  Location: [facility];  Service: General;  Laterality: Right;   CATARACT EXTRACTION W/ INTRAOCULAR LENS  IMPLANT, BILATERAL  2016; 2017   CEREBRAL ANEURYSM REPAIR  1996   clipped-no deficits RIGHT CAROTID   CHOLECYSTECTOMY  2010   laparoscopic   CYSTOSCOPY W/ RETROGRADES Right 06/09/2017   Procedure: CYSTOSCOPY WITH RETROGRADE PYELOGRAM;  Surgeon: Osborn Blaze, MD;  Location: [facility];  Service: Urology;  Laterality: Right;   CYSTOSCOPY W/ RETROGRADES Right 07/21/2017   Procedure: CYSTOSCOPY WITH RETROGRADE PYELOGRAM;  Surgeon: Osborn Blaze, MD;  Location: [facility];  Service: Urology;  Laterality: Right;   CYSTOSCOPY WITH RETROGRADE PYELOGRAM, URETEROSCOPY AND STENT PLACEMENT Left 08/26/2016   Procedure: CYSTOSCOPY WITH BILATERAL RETROGRADE PYELOGRAM,LEFT URETEROSCOPY WITH BIOPSY AND LEFT STENT PLACEMENT;  Surgeon: Osborn Blaze, MD;  Location: [facility];  Service: Urology;  Laterality: Left;   PORT-A-CATH  REMOVAL Left 03/24/2021   Procedure: PORT-A-CATH REMOVAL;  Surgeon: Oza Blumenthal, MD;  Location: [facility];  Service: General;  Laterality: Left;   PORTACATH PLACEMENT Left 06/18/2020   Procedure: INSERTION PORT-A-CATH WITH ULTRASOUND GUIDANCE;  Surgeon: Oza Blumenthal, MD;  Location: [facility];  Service: General;  Laterality: Left;   ROBOT ASSITED LAPAROSCOPIC NEPHROURETERECTOMY Left 10/09/2016   Procedure: XI ROBOT ASSITED LAPAROSCOPIC NEPHROURETERECTOMY;  Surgeon: Osborn Blaze, MD;  Location: [facility];  Service: Urology;  Laterality: Left;   SURGERY ON FOOT FOR STAPH INFECTION  1989   TRANSURETHRAL RESECTION OF BLADDER TUMOR N/A 07/21/2017   Procedure: TRANSURETHRAL RESECTION OF BLADDER TUMOR (TURBT);  Surgeon: Osborn Blaze, MD;  Location: [facility];  Service: Urology;  Laterality: N/A;   TRANSURETHRAL RESECTION OF BLADDER TUMOR WITH MITOMYCIN -C N/A 06/09/2017   Procedure: TRANSURETHRAL RESECTION OF BLADDER TUMOR WITH MITOMYCIN -C;  Surgeon: Osborn Blaze, MD;  Location: [facility];  Service:  Urology;  Laterality: N/A;   TUBAL LIGATION  1980    Her Family History Is Significant For: Family History  Problem Relation Age of Onset   Cancer Mother    Hyperlipidemia Father    Heart attack Father    Diabetes Brother    Breast cancer Neg Hx    Sleep apnea Neg Hx     Her Social History Is Significant For: Social History   Socioeconomic History   Marital status: Married    Spouse name: Not on file   Number of children: Not on file   Years of education: Not on file   Highest education level: Bachelor's degree (e.g., BA, AB, BS)  Occupational History   Not on file  Tobacco Use   Smoking status: Never   Smokeless tobacco: Never  Vaping Use   Vaping status: Never Used  Substance and Sexual Activity   Alcohol use: Not Currently    Comment: , STOPPED   Drug use: No   Sexual activity: Yes  Other Topics Concern   Not on file   Social History Narrative   Not on file   Social Drivers of Health   Financial Resource Strain: Low Risk  (02/09/2023)   Overall Financial Resource Strain (CARDIA)    Difficulty of Paying Living Expenses: Not hard at all  Food Insecurity: No Food Insecurity (02/09/2023)   Hunger Vital Sign    Worried About Running Out of Food in the Last Year: Never true    Ran Out of Food in the Last Year: Never true  Transportation Needs: No Transportation Needs (02/09/2023)   PRAPARE - Administrator, Civil Service (Medical): No    Lack of Transportation (Non-Medical): No  Physical Activity: Insufficiently Active (02/09/2023)   Exercise Vital Sign    Days of Exercise per Week: 3 days    Minutes of Exercise per Session: 40 min  Stress: No Stress Concern Present (02/09/2023)   [employer] of Occupational Health - Occupational Stress Questionnaire    Feeling of Stress : Not at all  Social Connections: Socially Integrated (02/09/2023)   Social Connection and Isolation Panel [NHANES]    Frequency of Communication with Friends and Family: More than three times a week    Frequency of Social Gatherings with Friends and Family: Once a week    Attends Religious Services: More than 4 times per year    Active Member of [employer] or Organizations: Yes    Attends Engineer, structural: More than 4 times per year    Marital Status: Married    Her Allergies Are:  Allergies  Allergen Reactions   Other Other (See Comments)   Adhesive [Tape] Rash    PAPER TAPE ONLY to use   Chlorhexidine  Gluconate Itching and Rash    Topical antiseptic (CHG WIPES)    Dilantin [Phenytoin] Rash   Penicillins Rash    Has patient had a PCN reaction causing immediate rash, facial/tongue/throat swelling, SOB or lightheadedness with hypotension:unsure Has patient had a PCN reaction causing severe rash involving mucus membranes or skin necrosis:unsure Has patient had a PCN reaction that required  hospitalization:No Has patient had a PCN reaction occurring within the last 10 years:No If all of the above answers are "NO", then may proceed with Cephalosporin use.    Sulfa Antibiotics Rash  :   Her Current Medications Are:  Outpatient Encounter Medications as of 10/13/2023  Medication Sig   acetaminophen  (TYLENOL ) 500 MG tablet Take 500 mg by mouth  at bedtime.    anastrozole  (ARIMIDEX ) 1 MG tablet Take 1 tablet (1 mg total) by mouth daily.   aspirin  EC 81 MG tablet Take 1 tablet (81 mg total) by mouth daily. Swallow whole.   BD PEN NEEDLE NANO U/F 32G X 4 MM MISC USE FOR INSULIN  ADMINISTRATION DAILY   Biotin 10 MG CAPS Take by mouth daily. 5000 mcg bid per pt   carvedilol  (COREG ) 3.125 MG tablet in the morning and at bedtime.   cetirizine (ZYRTEC) 10 MG tablet Take 10 mg by mouth every morning.   Continuous Blood Gluc Sensor (FREESTYLE LIBRE 14 DAY SENSOR) MISC by Does not apply route.   HUMALOG  KWIKPEN 100 UNIT/ML KwikPen Inject 7 Units into the skin 3 (three) times daily. Usually takes 6 units   insulin  glargine (LANTUS  SOLOSTAR) 100 UNIT/ML Solostar Pen Inject 26 Units into the skin every evening.   levothyroxine  (SYNTHROID ) 50 MCG tablet TAKE 1 TABLET BY MOUTH DAILY BEFORE BREAKFAST   pravastatin  (PRAVACHOL ) 40 MG tablet Take 1 tablet (40 mg total) by mouth daily.   [DISCONTINUED] OVER THE COUNTER MEDICATION Vitamin d  3 50 mcg daily   No facility-administered encounter medications on file as of 10/13/2023.  :  Review of Systems:  Out of a complete 14 point review of systems, all are reviewed and negative with the exception of these symptoms as listed below: Review of Systems  Neurological:        Follow up for initial cpap.  Having issues with mask. (Had FFM then just nasal mask).  Wakes up mouth dry.  She has allergies this time year. Her first 30day compliance was a forced compliance she said, lying awake until she could take it off. Cannot get back to sleep after she wakes up.  ESS 2.   Does not like her DME [facility].     Objective:  Neurological Exam  Physical Exam Physical Examination:   Vitals:   10/13/23 0929  BP: (!) 141/76  Pulse: 68  SpO2: 99%    General Examination: The patient is a very pleasant 73 y.o. female in no acute distress. She appears well-developed and well-nourished and well groomed.   HEENT: Normocephalic, atraumatic, pupils are equal, round and reactive to light, extraocular tracking is good without limitation to gaze excursion or nystagmus noted. Hearing is grossly intact. Face is symmetric with normal facial animation. Speech is clear with no dysarthria noted. There is no hypophonia. There is no lip, neck/head, jaw or voice tremor. Neck is supple with full range of passive and active motion. There are no carotid bruits on auscultation. Oropharynx exam reveals: mild mouth dryness, adequate dental hygiene and mild airway crowding. Tongue protrudes centrally and palate elevates symmetrically.  Small area of irritation on nasal bridge.  Nasal inspection reveals nasal dryness and slightly deviated septum to the right.  Frequent nasal sniffling noted.   Chest: Clear to auscultation without wheezing, rhonchi or crackles noted.   Heart: S1+S2+0, regular and normal without murmurs, rubs or gallops noted.    Abdomen: Soft, non-tender and non-distended.   Extremities: There is no pitting edema in the distal lower extremities bilaterally.    Skin: Warm and dry without trophic changes noted.    Musculoskeletal: exam reveals no obvious joint deformities, left ankle wider than right, she has a history of left ankle injuries.   Neurologically:  Mental status: The patient is awake, alert and oriented in all 4 spheres. Her immediate and remote memory, attention, language skills and fund of  knowledge are appropriate. There is no evidence of aphasia, agnosia, apraxia or anomia. Speech is clear with normal prosody and enunciation. Thought process is linear.  Mood is normal and affect is normal.  Cranial nerves II - XII are as described above under HEENT exam.  Motor exam: Normal bulk, strength and tone is noted. There is no obvious action or resting tremor.  Fine motor skills and coordination: grossly intact.  Cerebellar testing: No dysmetria or intention tremor. There is no truncal or gait ataxia.  Sensory exam: intact to light touch in the upper and lower extremities.  Gait, station and balance: She stands easily. No veering to one side is noted. No leaning to one side is noted. Posture is age-appropriate and stance is narrow based. Gait shows normal stride length and normal pace. No problems turning are noted.    Assessment and Plan:  In summary, BRIANCA SHIH is a 73 year old female with an underlying medical history of breast cancer with status post right lumpectomy and chemo-radiation, degenerative joint disease, cerebral aneurysm repair, hypertension, hyperlipidemia, osteoarthritis, liver hemangioma, type 2 diabetes, bladder cancer with status post left radical nephroureterectomy in 2019, and obesity, who presents for follow-up consultation of her obstructive sleep apnea after interim testing and starting home AutoPap therapy.  She had a baseline polysomnogram through our sleep lab on 07/12/2023 which showed severe obstructive sleep apnea with an AHI of 43.6/h, O2 nadir 81%.  She started home AutoPap therapy on 07/21/2023.  She has a ResMed air sense 11 AutoSet machine.  Her DME provider is [facility].  She started up with a fullface mask called AirTouch and is now using a small nasal mask from [employer].  She is struggling with tolerance.  Her apnea control is not optimal currently, she has milder central apneas as well.  We had a long discussion today about her options.  She is encouraged to return for a designated in-lab CPAP titration study to optimize her treatment settings, work with improving tolerance and finding a better interface for  her.  I provided her with a sample mask from [employer], AirTouch N30i nasal cushion with a small wide and medium insert, standard frame.  She is encouraged to continue with AutoPap therapy for now.  We may be able to find a better treatment modality and settings for her after her sleep study with titration is completed.  She is agreeable to returning for a titration study.  We talked about her sleep study results and reviewed her compliance data in detail and worked on troubleshooting her tolerance issues.  She is encouraged to use nasal steroid spray which she previously received from her ENT.  She is encouraged to try nasal rinses such as Neti pot but is reluctant to do so.  We will plan to follow-up after testing.  I answered all her questions today and she was in agreement.  I spent 40 minutes in total face-to-face time and in reviewing records during pre-charting, more than 50% of which was spent in counseling and coordination of care, reviewing test results, reviewing medications and treatment regimen and/or in discussing or reviewing the diagnosis of OSA, difficulty with CPAP, the prognosis and treatment options. Pertinent laboratory and imaging test results that were available during this visit with the patient were reviewed by me and considered in my medical decision making (see chart for details).

## 2023-10-14 ENCOUNTER — Telehealth: Payer: Self-pay | Admitting: Neurology

## 2023-10-14 NOTE — Telephone Encounter (Signed)
CPAP U[employer] Medicare no auth req   Patient is scheduled at [facility] for 10/25/2023 at 8 pm.  Mailed packet to the patient.

## 2023-10-21 LAB — BASIC METABOLIC PANEL
BUN: 29 — AB (ref 4–21)
CO2: 26 — AB (ref 13–22)
Chloride: 104 (ref 99–108)
Creatinine: 1.6 — AB (ref 0.5–1.1)
Glucose: 239
Potassium: 4.6 meq/L (ref 3.5–5.1)
Sodium: 139 (ref 137–147)

## 2023-10-21 LAB — CBC AND DIFFERENTIAL
HCT: 43 (ref 36–46)
Hemoglobin: 14.5 (ref 12.0–16.0)
Platelets: 129 10*3/uL — AB (ref 150–400)
WBC: 4.7

## 2023-10-21 LAB — COMPREHENSIVE METABOLIC PANEL
Albumin: 4.1 (ref 3.5–5.0)
Calcium: 10 (ref 8.7–10.7)
eGFR: 34

## 2023-10-21 LAB — IRON,TIBC AND FERRITIN PANEL: Ferritin: 246

## 2023-10-22 LAB — LAB REPORT - SCANNED
Albumin, Urine POC: 38
Creatinine, POC: 97.9 mg/dL
Microalb Creat Ratio: 39
PTH, Intact: 18

## 2023-10-25 ENCOUNTER — Ambulatory Visit (INDEPENDENT_AMBULATORY_CARE_PROVIDER_SITE_OTHER): Payer: Medicare Other | Admitting: Neurology

## 2023-10-25 DIAGNOSIS — G4731 Primary central sleep apnea: Secondary | ICD-10-CM

## 2023-10-25 DIAGNOSIS — G478 Other sleep disorders: Secondary | ICD-10-CM

## 2023-10-25 DIAGNOSIS — Z789 Other specified health status: Secondary | ICD-10-CM

## 2023-10-25 DIAGNOSIS — Z9989 Dependence on other enabling machines and devices: Secondary | ICD-10-CM

## 2023-10-25 DIAGNOSIS — G4720 Circadian rhythm sleep disorder, unspecified type: Secondary | ICD-10-CM

## 2023-10-25 DIAGNOSIS — R0981 Nasal congestion: Secondary | ICD-10-CM

## 2023-10-25 DIAGNOSIS — G4733 Obstructive sleep apnea (adult) (pediatric): Secondary | ICD-10-CM | POA: Diagnosis not present

## 2023-10-28 NOTE — Addendum Note (Signed)
Addended by: Huston Foley on: 10/28/2023 09:21 AM   Modules accepted: Orders

## 2023-10-28 NOTE — Procedures (Signed)
Physician Interpretation:     [facility] PAP TITRATION INTERPRETATION REPORT   STUDY DATE: 10/25/2023      PATIENT NAME:  Tasha Tucker         DATE OF BIRTH:  April 10, 1951  PATIENT ID:  [id number]    TYPE OF STUDY:  CPAP  READING PHYSICIAN: Huston Foley, MD, PhD SCORING TECHNICIAN: Margaretann Loveless, RPSGT     Referred by: [name], MD  ? History and Indication for Testing: 73 year old female with an underlying medical history of breast cancer with status post right lumpectomy and chemo-radiation, degenerative joint disease, cerebral aneurysm repair, hypertension, hyperlipidemia, osteoarthritis, liver hemangioma, type 2 diabetes, bladder cancer with status post left radical nephroureterectomy in 2019, and obesity, who presents for a full night titration study to optimize treatment of her severe OSA. She is currently on home AutoPap therapy, but is struggling with tolerance and has residual obstructive and some central sleep disordered breathing. She had a baseline polysomnogram through our sleep lab on 07/12/2023 which showed severe obstructive sleep apnea with an AHI of 43.6/h, O2 nadir 81%. Height: 68.0 in Weight: 228 lb (BMI 34) Neck Size: 15.4 in  MEDICATIONS: Tylenol, Arimidex, Aspirin, Biotin, Coreg, Zyrtec, Humalog, Synthroid, Pravachol   DESCRIPTION: A sleep technologist was in attendance for the duration of the recording.  Data collection, scoring, video monitoring, and reporting were performed in compliance with the AASM Manual for the Scoring of Sleep and Associated Events; (Hypopnea is scored based on the criteria listed in Section VIII D. 1b in the AASM Manual V2.6 using a 4% oxygen desaturation rule or Hypopnea is scored based on the criteria listed in Section VIII D. 1a in the AASM Manual V2.6 using 3% oxygen desaturation and /or arousal rule).  A physician certified by the American Board of Sleep Medicine reviewed each epoch of the  study.   SLEEP CONTINUITY AND SLEEP ARCHITECTURE:  Lights off was at 22:27: and lights on 04:29: (6.0 hours in bed). Total sleep time was 101.0 minutes (39.6% supine;  60.4% lateral;  0.0% prone, 0.0% REM sleep), with a markedly decreased sleep efficiency at 27.9%. Sleep latency was normal at 11.5 minutes.  Of the total sleep time, the percentage of stage N1 sleep was 45.5%, which is markedly increased, stage N2 sleep was 40.1%, which is below normal, stage N3 sleep was 14.4%, which is normal, and REM sleep was absent. Wake after sleep onset (WASO) time accounted for 249 minutes249.5 minutes with severe sleep fragmentation noted.   AROUSAL: There were 80 arousals in total, for an arousal index of 47.5 arousals/hour.  Of these, 59 were identified as respiratory-related arousals (35.0 /h), 2 were PLM-related arousals (1.2 /h), and 37 were non-specific arousals (22.0 /h)  RESPIRATORY MONITORING:  Based on CMS criteria (using a 4% oxygen desaturation rule for scoring hypopneas), there were 56 apneas (10 obstructive; 46 central; 0 mixed), and 23 hypopneas.  Apnea index was 33.3. Hypopnea index was 13.7. The apnea-hypopnea index was 46.9 overall (67.5 supine, 0.0 non-supine; 0.0 REM, 0.0 supine REM). There were 0 respiratory effort-related arousals (RERAs).  The RERA index was 0.0 events/h. Total respiratory disturbance index (RDI) was 46.9 events/h. RDI results showed: supine RDI  67.5 /h; non-supine RDI 33.4 /h; REM RDI 0.0 /h, supine REM RDI 0.0 /h.   Based on AASM criteria (using a 3% oxygen desaturation and /or arousal rule for scoring hypopneas), there were 56 apneas (10 obstructive; 46 central; 0 mixed), and 23 hypopneas. Apnea  index was 33.3. Hypopnea index was 13.7. The apnea-hypopnea index was 46.9 overall (67.5 supine, 0.0 non-supine; 0.0 REM, 0.0 supine REM). There were 0 respiratory effort-related arousals (RERAs).  The RERA index was 0.0 events/h. Total respiratory disturbance index (RDI) was 46.9  events/h. RDI results showed: supine RDI  67.5 /h; non-supine RDI 33.4 /h; REM RDI 0.0 /h, supine REM RDI 0.0 /h.  Respiratory events were associated with oxyhemoglobin desaturations (nadir during sleep 76%) from a mean of 95%). There were 0 occurrences of Cheyne Stokes breathing.  OXIMETRY: Total sleep time spent at, or below 88% was 5.8 minutes, or 5.8% of total sleep time.  BODY POSITION: Duration of total sleep and percent of total sleep in their respective position is as follows: supine 40 minutes (39.6%), non-supine 61.0 minutes (60.4%); right 00 minutes (0.0%), left 61 minutes (60.4%), and prone 00 minutes (0.0%). Total supine REM sleep time was 00 minutes (0.0% of total REM sleep).  LIMB MOVEMENTS: There were 9 periodic limb movements of sleep (5.3/h), of which 2 (1.2/h) were associated with an arousal.   TITRATION DETAILS (SEE ALSO TABLE AT THE END OF THE REPORT):  A small Airtouch FFM from [employer] was used (same as home mask as patient preferred to use this). The patient was started on a CPAP of 10 cm and increased to 11 cm, at which time some central respiratory events were noted, AHI remained high, in the 50s to 60/hour. She had very fragmented sleep. She was started on BiPAP of 10/6, and titrated to 14/10 cm, with central events noted. A back up rate was added at 10/min. She was further titrated to a final pressure setting of 16/12 cm of water pressure and a back up rate of 10/min. She achieved minimal sleep on BiPAP and had a residual AHI on the final pressure setting of 48/hour. She had ongoing desaturations. She had some reduction of her sleep apnea events on a standard BiPAP of 12/8 cm with 34 min of sleep achieved, mostly non-supine and NREM.   EEG: Review of the EEG showed no abnormal electrical discharges and symmetrical bihemispheric findings.    EKG: The EKG revealed normal sinus rhythm (NSR). The average heart rate during sleep was 57 bpm.   AUDIO/VIDEO REVIEW: The audio and  video review did not show any abnormal or unusual behaviors, movements, phonations or vocalizations. The patient took 2 restroom breaks. Snoring was noted, but improved with PAP therapy during the titration.  POST-STUDY QUESTIONNAIRE: Post study, the patient indicated, that sleep was worse than usual.   IMPRESSION:  1. Severe Obstructive Sleep Apnea (OSA) 2. Central Sleep Apnea (CSA) 3. Inadequate treatment with CPAP 4. Dysfunctions associated with sleep stages or arousal from sleep 5. Poor sleep pattern  RECOMMENDATIONS:  1. This was a rather challenging titration with trial of CPAP, standard BiPAP and BiPAP study without resolution of her sleep disordered breathing. The study was significantly limited due to severe sleep fragmentation/poor sleep consolidation, sleep efficiency of only 27.9%, little supine sleep achived and absence of REM sleep. A reliable recommendation for home treatment settings cannot be made on the basis of this study. I would recommend home BiPAP therapy with autoBiPAP, with maximum IPAP of 16 cm, minimum EPAP of 8 cm, and 4 cm of pressure support, mask of choice, sized to fit, with heated humidity. The patient should be reminded to be fully compliant with PAP therapy to improve sleep related symptoms and decrease long term cardiovascular risks. The patient should be reminded,  that it may take up to 3 months to get fully used to using PAP with all planned sleep. The earlier full compliance is achieved, the better long term compliance tends to be. Please note that untreated obstructive sleep apnea carries additional perioperative morbidity. Patients with significant obstructive sleep apnea should receive perioperative PAP therapy and the surgeons and particularly the anesthesiologist should be informed of the diagnosis and the severity of the sleep disordered breathing. 2. The patient will be advised to sleep on her sides. Concomitant weight loss is highly recommended.  3. This  study shows sleep fragmentation and abnormal sleep stage percentages; these are nonspecific findings and per se do not signify an intrinsic sleep disorder or a cause for the patient's sleep-related symptoms. Causes include (but are not limited to) the first night effect of the sleep study, circadian rhythm disturbances, medication effect or an underlying mood disorder or medical problem.  4. The patient should be cautioned not to drive, work at heights, or operate dangerous or heavy equipment when tired or sleepy. Review and reiteration of good sleep hygiene measures should be pursued with any patient. 5. The patient will be seen in follow-up in the sleep clinic at [facility] for discussion of the test results, symptom and treatment compliance review, further management strategies, etc. The referring provider will be notified of the test results. I certify that I have reviewed the entire raw data recording prior to the issuance of this report in accordance with the Standards of Accreditation of the American Academy of Sleep Medicine (AASM).  Huston Foley, MD, PhD Medical Director, [facility]) Diplomat, ABPN (Neurology and Sleep)              Technical Report:   [facility] CPAP Summary    General Information  Name: Tasha Tucker, Tasha Tucker BMI: 21.30 Physician: Huston Foley, MD  ID: [id number] Height: 68.0 in Technician: Margaretann Loveless, RPSGT  Sex: Female Weight: 228.0 lb Record: [id number]  Age: 14 [March 22, 1951] Date: 10/25/2023     Medical & Medication History    73 year old female with an underlying medical history of breast cancer with status post right lumpectomy and chemo-radiation, degenerative joint disease, cerebral aneurysm repair, hypertension, hyperlipidemia, osteoarthritis, liver hemangioma, type 2 diabetes, bladder cancer with status post left radical nephroureterectomy in 2019, and obesity, who presents for  follow-up consultation of her obstructive sleep apnea after interim testing and starting home AutoPap therapy. I first met her at the request of her primary care physician on 03/22/2023, at which time she reported snoring and witnessed apneas per husband's observation. She was advised to proceed with a sleep study. She had a baseline polysomnogram through our sleep lab on 07/12/2023 which showed severe obstructive sleep apnea with an AHI of 43.6/h, O2 nadir 81%. She had moderate PLM's without significant arousals. She was advised to proceed with home AutoPap therapy. Set up date was 07/21/2023. She has a ResMed air sense 11 AutoSet machine. Her DME provider is [facility]. Today, 10/13/2023: I reviewed her AutoPap compliance data from 07/21/2023 through 08/19/2023, which is a total of 30 days, during which time she used her machine 29 days with percent use days greater than 4 hours at 73%, indicating adequate compliance with an average usage of 5 hours and 2 minutes for days of treatment, residual AHI elevated at 26.8/h with mild increase in central events noted. 95th percentile pressure at 10.8 cm with a range of 6 to 11 cm with EPR  of 3. Leak low with the 95th percentile at 2.2 L/min. She reports difficulty with tolerance of her AutoPap. She started with a fullface mask which was tolerable to use but she woke up with severe dry mouth and dry lips. She is now on a nasal mask, small sized Eson 2 from [employer], this mask cuts into her nose and is uncomfortable. She was given a chinstrap as well. She feels it difficult to continue with AutoPap therapy but is willing to continue to try. She is humidifier set at them but then water accumulated in the hose. She was compliant in the first month but current month shows much less usage, average usage of 2 hours and 31 minutes, percent use days greater than 4 hours of 13%.  Tylenol, Arimidex, Aspirin, Biotin, Coreg, Zyrtec, Humalog, Synthroid, Pravachol   Sleep  Disorder      Comments   The patient came into the sleep lab for a CPAP titration study. The patient had a prior PSG on 07/12/23 with our sleep lab. Study showed severe obstructive sleep apnea with an AHI of 43.6/h, O2 nadir 81%. The patient was fitted with ResMed F20 Air Touch [facility]) size Small. This is the interface the patient uses at home. She declined being fitted for any other interface. CPAP was initiated at [facility] with ERP of 1. CPAP was titrated up to 11cm with EPR of 3. The patient was switched to BiPAP 10/6cmH2O to see if it would help with CSA and to see if it would help the patient maintain sleep. The patient was switched to BiPAP ST 14/10cmH2O with BUR of 10 due to CSA. BiPAP ST was titrated up to 16/12cmH2O BUR of 10. BiPAP ST was not titrated any higher due to patient not going back to sleep. The patient had very fragmented sleep with multiple periods of WASO. Sleep efficiency was.. Two restroom breaks. EKG did not show any obvious cardiac arrhythmias. No snoring. Respiratory events scored with a 4% desat. Respiratory events were worse supine. The patient slept lateral and supine. Some PLM's. The patient did not obtain 2 hrs of TST. No REM sleep.     CPAP start time: 10:38:07 PM CPAP end time: 04:29:22 AM   Time Total Supine Side Prone Upright  Recording (TRT) 5h 51.58m 2h 54.65m 2h 57.73m 0h 0.11m 0h 0.74m  Sleep (TST) 1h 41.60m 0h 40.43m 1h 1.81m 0h 0.46m 0h 0.67m   Latency N1 N2 N3 REM Onset Per. Slp. Eff.  Actual 0h 0.2m 0h 29.72m 1h 9.60m 0h 0.20m 0h 1.27m 0h 54.20m 28.73%   Stg Dur Wake N1 N2 N3 REM  Total 250.5 46.0 40.5 14.5 0.0  Supine 134.0 28.0 12.0 0.0 0.0  Side 116.5 18.0 28.5 14.5 0.0  Prone 0.0 0.0 0.0 0.0 0.0  Upright 0.0 0.0 0.0 0.0 0.0   Stg % Wake N1 N2 N3 REM  Total 71.3 45.5 40.1 14.4 0.0  Supine 38.1 27.7 11.9 0.0 0.0  Side 33.1 17.8 28.2 14.4 0.0  Prone 0.0 0.0 0.0 0.0 0.0  Upright 0.0 0.0 0.0 0.0 0.0     Apnea Summary Sub Supine Side Prone Upright  Total 56 Total  56 38 18 0 0    REM 0 0 0 0 0    NREM 56 38 18 0 0  Obs 10 REM 0 0 0 0 0    NREM 10 10 0 0 0  Mix 0 REM 0 0 0 0 0    NREM 0 0 0 0  0  Cen 46 REM 0 0 0 0 0    NREM 46 28 18 0 0   Rera Summary Sub Supine Side Prone Upright  Total 0 Total 0 0 0 0 0    REM 0 0 0 0 0    NREM 0 0 0 0 0   Hypopnea Summary Sub Supine Side Prone Upright  Total 23 Total 23 7 16  0 0    REM 0 0 0 0 0    NREM 23 7 16  0 0   4% Hypopnea Summary Sub Supine Side Prone Upright  Total (4%) 23 Total 23 7 16  0 0    REM 0 0 0 0 0    NREM 23 7 16  0 0     AHI Total Obs Mix Cen  46.93 Apnea 33.27 5.94 0.00 27.33   Hypopnea 13.66 -- -- --  46.93 Hypopnea (4%) 13.66 -- -- --    Total Supine Side Prone Upright  Position AHI 46.93 67.50 33.44 0.00 0.00  REM AHI 0.00   NREM AHI 46.93   Position RDI 46.93 67.50 33.44 0.00 0.00  REM RDI 0.00   NREM RDI 46.93    4% Hypopnea Total Supine Side Prone Upright  Position AHI (4%) 46.93 67.50 33.44 0.00 0.00  REM AHI (4%) 0.00   NREM AHI (4%) 46.93   Position RDI (4%) 46.93 67.50 33.44 0.00 0.00  REM RDI (4%) 0.00   NREM RDI (4%) 46.93    Desaturation Information  <100% <90% <80% <70% <60% <50% <40%  Supine 64 21 3 0 0 0 0  Side 70 17 0 0 0 0 0  Prone 0 0 0 0 0 0 0  Upright 0 0 0 0 0 0 0  Total 134 38 3 0 0 0 0  Desaturation threshold setting: 3% Minimum desaturation setting: 10 seconds SaO2 nadir: 74% The longest event was a 91 sec obstructive Hypopnea with a minimum SaO2 of 78%. The lowest SaO2 was 76% associated with a 71 sec obstructive Hypopnea. EKG Rates EKG Avg Max Min  Awake 59 84 51  Asleep 57 72 51  EKG Events: N/A Awakening/Arousal Information # of Awakenings 52  Wake after sleep onset 249.6m  Wake after persistent sleep 218.28m   Arousal Assoc. Arousals Index  Apneas 45 26.7  Hypopneas 14 8.3  Leg Movements 2 1.2  Snore 0.0 0.0  PTT Arousals 0 0.0  Spontaneous 37 22.0  Total 98 58.2  Myoclonus Information PLMS LMs Index  Total LMs  during PLMS 9 5.3  LMs w/ Microarousals 2 1.2   LM LMs Index  w/ Microarousal 0 0.0  w/ Awakening 0 0.0  w/ Resp Event 0 0.0  Spontaneous 1 0.6  Total 1 0.6      Titration Table:  [facility] Enhanced PAP Report    General Information  Name: Tasha Tucker, Tasha Tucker BMI: 34 Physician: Huston Foley  ID: [id number] Height: 68 in Technician: Margaretann Loveless  Sex: Female Weight: 228 lb Record: [id number]  Age: 17 [01/26/1951] Date: 10/25/2023 Scorer: Margaretann Loveless    Recommended Settings  Protocol:   N/A  Device:   N/A  Mask:   N/A AHI:    N/A    Pressure Support:     N/A to   N/A cmH20   IPAP:     N/A to   N/A cmH20  EPAP:     N/A to   N/A cmH20  Max Pressure:     N/A  Backup Rate:     N/A  Humidity:     N/A AHI (4%):   N/A   Pressure Settings Phase THERAPY THERAPY THERAPY THERAPY THERAPY THERAPY THERAPY THERAPY   Protocol - - - - - - - -   Max Pressure - - - - - - - -   IPAP 10 11 10 11 12 13 14 14    EPAP 10 11 06 07 08 09 10 10   PS - - - - - - - -   Device - - - - - - - -   Mask - - - - - - - -   Backup Rate - - - - - - - 10   Humidity - - - - - - - -  Time TRT 27.109m 9.60m 6.37m 22.102m 40.59m 15.11m 64.39m 34.70m   TST 6.88m 4.30m 4.49m 12.40m 34.60m 6.13m 9.55m 8.41m   Event Epoch 17 72 90 103 148 228 258 387  Sleep Stage % Wake 78.2 50.0 30.8 46.7 15.0 60.0 86.0 76.8   % REM 0.0 0.0 0.0 0.0 0.0 0.0 0.0 0.0   % N1 100.0 88.9 44.4 54.2 10.3 100.0 77.8 56.3   % N2 0.0 11.1 55.6 45.8 47.1 0.0 22.2 43.8   % N3 0.0 0.0 0.0 0.0 42.6 0.0 0.0 0.0  Respiratory Total Events 6 4 8 15 7 9 12 6    Obs. Apn. 0 0 0 3 2 4 1  0   Mixed Apn. 0 0 0 0 0 0 0 0   Cen. Apn. 6 4 7 11 2 5 11  0   Obs. Hyp. 0 0 1 1 3  0 0 6   Cen. Hyp. 0 0 0 0 0 0 0 0   AHI 60.00 53.33 106.67 75.00 12.35 90.00 80.00 45.00   Supine AHI 60.00 53.33 106.67 73.85 60.00 90.00 60.00 30.00   Prone AHI 0.00 0.00 0.00 0.00 0.00 0.00 0.00 0.00   Side AHI 0.00 0.00 0.00 76.36 6.00 0.00 82.50 60.00   Respiratory (4%) Obs. Hyp. (4%) 0.00 0.00 1.00 1.00 3.00 0.00 0.00 6.00   Cen. Hyp. (4%) 0.00 0.00 0.00 0.00 0.00 0.00 0.00 0.00   AHI (4%) 60.00 53.33 106.67 75.00 12.35 90.00 80.00 45.00   Supine AHI (4%) 60.00 53.33 106.67 73.85 60.00 90.00 60.00 30.00   Prone AHI (4%) 0.00 0.00 0.00 0.00 0.00 0.00 0.00 0.00   Side AHI (4%) 0.00 0.00 0.00 76.36 6.00 0.00 82.50 60.00  Desat Profile <= 90% 0.50m 0.55m 0.83m 2.39m 0.90m 0.47m 10.53m 1.68m   <= 80% 0.15m 0.72m 0.30m 0.11m 0.58m 0.5m 8.11m 0.11m   <= 70% 0.15m 0.72m 0.16m 0.32m 0.12m 0.68m 8.21m 0.65m   <= 60% 0.30m 0.67m 0.59m 0.16m 0.74m 0.10m 8.24m 0.23m  Arousal Index Apnea 50.0 53.3 80.0 50.0 5.3 80.0 60.0 0.0   Hypopnea 0.0 0.0 13.3 5.0 1.8 0.0 0.0 37.5   LM 0.0 0.0 0.0 0.0 0.0 0.0 0.0 0.0   Spontaneous 70.0 53.3 13.3 35.0 8.8 10.0 20.0 22.5   Pressure Settings Phase THERAPY THERAPY   Protocol - -   Max Pressure - -   IPAP 15 16   EPAP 11 12   PS - -   Device - -   Mask - -   Backup Rate 10 10   Humidity - -  Time TRT 85.1m 52.29m   TST 9.55m 7.25m   Event Epoch 456 627  Sleep  Stage % Wake 88.9 85.7   % REM 0.0 0.0   % N1 68.4 0.0   % N2 31.6 100.0   % N3 0.0 0.0  Respiratory Total Events 6 6   Obs. Apn. 0 0   Mixed Apn. 0 0   Cen. Apn. 0 0   Obs. Hyp. 6 6   Cen. Hyp. 0 0   AHI 37.89 48.00   Supine AHI 51.43 0.00   Prone AHI 0.00 0.00   Side AHI 30.00 48.00  Respiratory (4%) Obs. Hyp. (4%) 6.00 6.00   Cen. Hyp. (4%) 0.00 0.00   AHI (4%) 37.89 48.00   Supine AHI (4%) 51.43 0.00   Prone AHI (4%) 0.00 0.00   Side AHI (4%) 30.00 48.00  Desat Profile <= 90% 8.8m 6.38m   <= 80% 7.29m 0.25m   <= 70% 6.62m 0.32m   <= 60% 6.22m 0.67m  Arousal Index Apnea 0.0 0.0   Hypopnea 31.6 8.0   LM 12.6 0.0   Spontaneous 37.9 0.0

## 2023-10-29 ENCOUNTER — Telehealth: Payer: Self-pay

## 2023-10-29 NOTE — Telephone Encounter (Signed)
-----   Message from Huston Foley sent at 10/28/2023  9:21 AM EST ----- Patient had a CPAP titration study on 10/25/2023.  Unfortunately, she did not sleep very well, achieved no dream sleep and had trouble sleeping on practically all settings of CPAP and BiPAP.  I would like to recommend home auto BiPAP therapy.  It would be a different machine than her AutoPap machine currently.  If the patient is agreeable, we can send an order to her current DME provider and see if her insurance would cover auto BiPAP therapy for her severe sleep apnea.  She would need a follow-up after starting BiPAP therapy in 60 to 90 days after set up.

## 2023-10-29 NOTE — Telephone Encounter (Signed)
Contacted pt regarding her sleep study, went over MD recommendations that Unfortunately, she did not sleep very well, achieved no dream sleep and had trouble sleeping on practically all settings of CPAP and BiPAP. MD would like to recommend home auto BiPAP therapy.  It would be a different machine than her AutoPap machine currently. I explained the difference to her. She stated she would like to hold off on switch to BiPAP therapy at this time.  She stated she went to [facility] yesterday to discuss mask options for her.  It is harder for her to not sleep on her back with the current facemask that she has.  If she sleeps on her side, she has more leakage.  She was able to get a nasal mask fitted and more comfortable for her to be able to sleep on her side.  She did order this mask and it should arrive within 5-7 business days.  She would like to try this new mask before switching over to BiPAP therapy.  I advised her if the new mask does not work for her, please let us know and we will consider switching therapy devices.  Patient verbally understood and was appreciative for the call.

## 2023-10-29 NOTE — Telephone Encounter (Signed)
Please ask her to check in with [place] via phone call or MyChart message in 2-4 weeks, we can look at a DL remotely.

## 2023-10-30 DIAGNOSIS — G4733 Obstructive sleep apnea (adult) (pediatric): Secondary | ICD-10-CM | POA: Insufficient documentation

## 2023-10-30 NOTE — Progress Notes (Signed)
 [facility] [address], [address] [place], [place] [address] [phone]  Date:  11/03/2023   Name:  Tasha Tucker   DOB:  05-16-1951   MRN:  [id number]  PCP:  Watt Harlene BROCKS, MD    Chief Complaint: 6 month follow up (Concerns/ questions: none/A1C due/AWV- overdue)   History of Present Illness:  Tasha Tucker is a 73 y.o. very pleasant female patient who presents with the following:  Patient seen today for periodic recheck-she also has several other specialists Most recent visit with myself was in August History of breast cancer diagnosed 2021-her oncologist is Dr. Layla. She was treated with definitive surgical management, completed radiation and is currently taking anastrozole -we will use this for 5 years.  She does get hot flashes midmorning most days likely due to her anastrozole    Also history of diabetes, hyperlipidemia, stage IV chronic kidney disease, renal and bladder cancer, cerebral aneurysm status post repair, hypertension She had a left nephrectomy in 2018, brain aneurysm clipping in 1990 Her urologist is Dr. Alvaro Nephrologist Dr. Gearline- seen 1/31 Cardiology, most recent visit was in October Endocrinology is managing her insulin -dependent diabetes-Ry Moody Easley at [facility]  Pt notes her most recent A1c was 7.8, she will recheck in March -they did not check her TSH  A1c is done per endocrinology She was recently evaluated for sleep apnea and has been started on CPAP- she is having a really hard time adjusting to this and notes she is only able to wear her mask about 4 hours a night We discussed having her see ENT for surgical reduction of her posterior oropharynx tissues, for the time being she does not wish to go this far  Last covid done in 2024, fall  She notes thickening and discoloration of her right great toe consistent with onychomycosis.  She recently had a pedicure and the nail technician  was able to reduce the nail, it feels much more comfortable to her.  At this time she declines treatment for onychomycosis   Patient Active Problem List   Diagnosis Date Noted   Obstructive sleep apnea 10/30/2023   Hypothyroidism 04/30/2021   Thrombocytopenia (HCC) 02/17/2021   Port-A-Cath in place 07/11/2020   Malignant neoplasm of upper-outer quadrant of right breast in female, estrogen receptor positive (HCC) 03/28/2020   Controlled type 2 diabetes mellitus with complication, with long-term current use of insulin  (HCC) 03/19/2018   Hyperlipidemia 01/12/2018   Cerebral aneurysm 12/29/2017   Secondary hypertension, unspecified 12/29/2017   CKD (chronic kidney disease) stage 4, GFR 15-29 ml/min (HCC) 12/29/2017   Cancer of renal pelvis, 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   Renal mass 10/09/2016    Past Medical History:  Diagnosis Date   Bladder cancer (HCC)    UROLOGIST- DR [facility]   Breast cancer (HCC) 2021   right lumpectomy and chemo and radiation done   DJD (degenerative joint disease)    NECK, NEEDS PILLOW WHEN LYING FLAT IS POSSIBLE   History of cerebral aneurysm repair    1996-- S/P CLAPPING RIGHT CAROTID --- NO DEFICITS   History of renal pelvis cancer    10-09-2016  S/P  LEFT RADICAL NEPHROURETERECTOMY -- HIGH GRADE PAPILLARY UROTHELIAL CARCINOMA   Hyperlipidemia    Hypertension    Liver hemangioma    yrs ago per pt on 03-19-2021   OA (osteoarthritis)    Personal history of chemotherapy    Personal history of radiation therapy  PONV (postoperative nausea and vomiting)    Type 2 diabetes mellitus (HCC)    UTI (urinary tract infection)    finished antibiotics 03-01-2021 all symptoms resolved   Wears glasses 03/19/2021    Past Surgical History:  Procedure Laterality Date   ABDOMINAL HYSTERECTOMY  1990   PARTIAL   BREAST LUMPECTOMY WITH RADIOACTIVE SEED AND SENTINEL LYMPH NODE BIOPSY Right 04/17/2020   Procedure: BREAST LUMPECTOMY X 2  WITH RADIOACTIVE SEED AND SENTINEL LYMPH  NODE BIOPSY;  Surgeon: Vernetta Berg, MD;  Location: [facility];  Service: General;  Laterality: Right;   CATARACT EXTRACTION W/ INTRAOCULAR LENS  IMPLANT, BILATERAL  2016; 2017   CEREBRAL ANEURYSM REPAIR  1996   clipped-no deficits RIGHT CAROTID   CHOLECYSTECTOMY  2010   laparoscopic   CYSTOSCOPY W/ RETROGRADES Right 06/09/2017   Procedure: CYSTOSCOPY WITH RETROGRADE PYELOGRAM;  Surgeon: Alvaro Hummer, MD;  Location: [facility];  Service: Urology;  Laterality: Right;   CYSTOSCOPY W/ RETROGRADES Right 07/21/2017   Procedure: CYSTOSCOPY WITH RETROGRADE PYELOGRAM;  Surgeon: Alvaro Hummer, MD;  Location: [facility];  Service: Urology;  Laterality: Right;   CYSTOSCOPY WITH RETROGRADE PYELOGRAM, URETEROSCOPY AND STENT PLACEMENT Left 08/26/2016   Procedure: CYSTOSCOPY WITH BILATERAL RETROGRADE PYELOGRAM,LEFT URETEROSCOPY WITH BIOPSY AND LEFT STENT PLACEMENT;  Surgeon: Hummer Alvaro, MD;  Location: [facility];  Service: Urology;  Laterality: Left;   PORT-A-CATH REMOVAL Left 03/24/2021   Procedure: PORT-A-CATH REMOVAL;  Surgeon: Vernetta Berg, MD;  Location: [facility];  Service: General;  Laterality: Left;   PORTACATH PLACEMENT Left 06/18/2020   Procedure: INSERTION PORT-A-CATH WITH ULTRASOUND GUIDANCE;  Surgeon: Vernetta Berg, MD;  Location: [facility];  Service: General;  Laterality: Left;   ROBOT ASSITED LAPAROSCOPIC NEPHROURETERECTOMY Left 10/09/2016   Procedure: XI ROBOT ASSITED LAPAROSCOPIC NEPHROURETERECTOMY;  Surgeon: Hummer Alvaro, MD;  Location: [facility];  Service: Urology;  Laterality: Left;   SURGERY ON FOOT FOR STAPH INFECTION  1989   TRANSURETHRAL RESECTION OF BLADDER TUMOR N/A 07/21/2017   Procedure: TRANSURETHRAL RESECTION OF BLADDER TUMOR (TURBT);  Surgeon: Alvaro Hummer, MD;  Location: [facility];  Service: Urology;  Laterality: N/A;   TRANSURETHRAL RESECTION OF BLADDER TUMOR WITH MITOMYCIN -C N/A  06/09/2017   Procedure: TRANSURETHRAL RESECTION OF BLADDER TUMOR WITH MITOMYCIN -C;  Surgeon: Alvaro Hummer, MD;  Location: [facility];  Service: Urology;  Laterality: N/A;   TUBAL LIGATION  1980    Social History   Tobacco Use   Smoking status: Never   Smokeless tobacco: Never  Vaping Use   Vaping status: Never Used  Substance Use Topics   Alcohol use: Not Currently    Comment: , STOPPED   Drug use: No    Family History  Problem Relation Age of Onset   Cancer Mother    Hyperlipidemia Father    Heart attack Father    Diabetes Brother    Breast cancer Neg Hx    Sleep apnea Neg Hx     Allergies  Allergen Reactions   Other Other (See Comments)   Adhesive [Tape] Rash    PAPER TAPE ONLY to use   Chlorhexidine  Gluconate Itching and Rash    Topical antiseptic (CHG WIPES)    Dilantin [Phenytoin] Rash   Penicillins Rash    Has patient had a PCN reaction causing immediate rash, facial/tongue/throat swelling, SOB or lightheadedness with hypotension:unsure Has patient had a PCN reaction causing severe rash involving mucus membranes or skin necrosis:unsure Has patient had a  PCN reaction that required hospitalization:No Has patient had a PCN reaction occurring within the last 10 years:No If all of the above answers are NO, then may proceed with Cephalosporin use.    Sulfa Antibiotics Rash    Medication list has been reviewed and updated.  Current Outpatient Medications on File Prior to Visit  Medication Sig Dispense Refill   acetaminophen  (TYLENOL ) 500 MG tablet Take 500 mg by mouth at bedtime.      anastrozole  (ARIMIDEX ) 1 MG tablet Take 1 tablet (1 mg total) by mouth daily. 90 tablet 4   aspirin  EC 81 MG tablet Take 1 tablet (81 mg total) by mouth daily. Swallow whole. 90 tablet 3   BD PEN NEEDLE NANO U/F 32G X 4 MM MISC USE FOR INSULIN  ADMINISTRATION DAILY 100 each 3   Biotin 10 MG CAPS Take by mouth daily. 5000 mcg bid per pt 30 capsule    carvedilol   (COREG ) 3.125 MG tablet in the morning and at bedtime.     cetirizine (ZYRTEC) 10 MG tablet Take 10 mg by mouth every morning.     Continuous Blood Gluc Sensor (FREESTYLE LIBRE 14 DAY SENSOR) MISC by Does not apply route.     HUMALOG  KWIKPEN 100 UNIT/ML KwikPen Inject 7 Units into the skin 3 (three) times daily. Usually takes 6 units     insulin  glargine (LANTUS  SOLOSTAR) 100 UNIT/ML Solostar Pen Inject 26 Units into the skin every evening.     levothyroxine  (SYNTHROID ) 50 MCG tablet TAKE 1 TABLET BY MOUTH DAILY BEFORE BREAKFAST 90 tablet 1   pravastatin  (PRAVACHOL ) 40 MG tablet Take 1 tablet (40 mg total) by mouth daily. 90 tablet 1   No current facility-administered medications on file prior to visit.    Review of Systems:  As per HPI- otherwise negative.   Physical Examination: Vitals:   11/03/23 0853  BP: 118/76  Pulse: 76  Resp: 18  Temp: 98 F (36.7 C)  SpO2: 98%   Vitals:   11/03/23 0853  Weight: 234 lb 6.4 oz (106.3 kg)  Height: 5' 8 (1.727 m)   Body mass index is 35.64 kg/m. Ideal Body Weight: Weight in (lb) to have BMI = 25: 164.1  GEN: no acute distress.  Obese, looks well HEENT: Atraumatic, Normocephalic.  She has a small posterior oropharynx, tonsils still present Ears and Nose: No external deformity. CV: RRR, No M/G/R. No JVD. No thrill. No extra heart sounds. PULM: CTA B, no wheezes, crackles, rhonchi. No retractions. No resp. distress. No accessory muscle use. EXTR: No c/c/e PSYCH: Normally interactive. Conversant.  Pedicure with nail polish, thickening of the right great toenail consistent with onychomycosis   Assessment and Plan: Hypothyroidism (acquired) - Plan: TSH  Controlled type 2 diabetes mellitus with complication, with long-term current use of insulin  (HCC)  Mixed hyperlipidemia  CKD (chronic kidney disease) stage 4, GFR 15-29 ml/min (HCC)  Elevated coronary artery calcium score  Obstructive sleep apnea  Onychomycosis of great  toe  Patient seen today for follow-up.  Diabetes is managed by endocrinology, under good control.  I will check her TSH today.  Kidney disease has been stable, she is managed by nephrology.  We discussed her sleep apnea, right now she is trying to get 4 hours of mask wear per day.  This may be about the best she can do.  She will let me know if she wishes to see an ENT. Discussed treatment for onychomycosis with terbinafine, for the time being she declines but she  will keep this in mind We did a CT coronary calcium about a year ago, 91st percentile.  She is following up with cardiology and her cholesterol numbers look good  Signed Harlene Schroeder, MD

## 2023-11-01 LAB — FOR HOME USE ONLY DME CONTINUOUS POSITIVE AIRWAY PRESSURE (CPAP): MCV: 92 (ref 76–111)

## 2023-11-01 NOTE — Telephone Encounter (Signed)
I spoke with the patient. She will write on her calendar a reminder to mychart [place] (or call) in 2-4 weeks after receiving her new mask. We will do a remote DL and see how things look. She verbalized appreciation for the call.

## 2023-11-03 ENCOUNTER — Ambulatory Visit (INDEPENDENT_AMBULATORY_CARE_PROVIDER_SITE_OTHER): Payer: Medicare Other | Admitting: Family Medicine

## 2023-11-03 ENCOUNTER — Encounter: Payer: Self-pay | Admitting: Family Medicine

## 2023-11-03 VITALS — BP 118/76 | HR 76 | Temp 98.0°F | Resp 18 | Ht 68.0 in | Wt 234.4 lb

## 2023-11-03 DIAGNOSIS — E782 Mixed hyperlipidemia: Secondary | ICD-10-CM

## 2023-11-03 DIAGNOSIS — Z794 Long term (current) use of insulin: Secondary | ICD-10-CM | POA: Diagnosis not present

## 2023-11-03 DIAGNOSIS — E039 Hypothyroidism, unspecified: Secondary | ICD-10-CM | POA: Diagnosis not present

## 2023-11-03 DIAGNOSIS — N184 Chronic kidney disease, stage 4 (severe): Secondary | ICD-10-CM | POA: Diagnosis not present

## 2023-11-03 DIAGNOSIS — G4733 Obstructive sleep apnea (adult) (pediatric): Secondary | ICD-10-CM

## 2023-11-03 DIAGNOSIS — B351 Tinea unguium: Secondary | ICD-10-CM

## 2023-11-03 DIAGNOSIS — E118 Type 2 diabetes mellitus with unspecified complications: Secondary | ICD-10-CM

## 2023-11-03 DIAGNOSIS — R931 Abnormal findings on diagnostic imaging of heart and coronary circulation: Secondary | ICD-10-CM

## 2023-11-03 LAB — TSH: TSH: 2.48 u[IU]/mL (ref 0.35–5.50)

## 2023-11-03 NOTE — Patient Instructions (Signed)
 Good to see you today- I will be in touch with your TSH asap Please see me in 6-12 months We could have you talk to an ENT about surgery for your tonsils/ throat to improve your sleep apnea!

## 2023-11-22 ENCOUNTER — Encounter: Payer: Self-pay | Admitting: Neurology

## 2023-11-22 NOTE — Telephone Encounter (Signed)
 I called the patient. She said she didn't get a new mask but is using the FFM. She states she doesn't want Bipap now. She also said she is going on a cruise the first of April and will be back Easter Monday. She had a question about Bipap which I was able to answer. I told her the info would be reviewed by Dr Frances Furbish and we will get back in touch with her. She thanked me for the call.

## 2023-11-22 NOTE — Telephone Encounter (Signed)
 Ok to maintain current treatment as patient does not wish to change settings a this time. Please offer FU appt with NP for next available in the next 3 mon.

## 2023-11-25 ENCOUNTER — Other Ambulatory Visit: Payer: Self-pay | Admitting: Hematology and Oncology

## 2023-11-25 DIAGNOSIS — Z170 Estrogen receptor positive status [ER+]: Secondary | ICD-10-CM

## 2023-12-27 ENCOUNTER — Other Ambulatory Visit: Payer: Self-pay | Admitting: Family Medicine

## 2023-12-27 ENCOUNTER — Encounter: Payer: Self-pay | Admitting: Family Medicine

## 2023-12-27 DIAGNOSIS — E039 Hypothyroidism, unspecified: Secondary | ICD-10-CM

## 2023-12-27 MED ORDER — LEVOTHYROXINE SODIUM 50 MCG PO TABS
50.0000 ug | ORAL_TABLET | Freq: Every day | ORAL | 3 refills | Status: AC
Start: 2023-12-27 — End: ?

## 2023-12-29 LAB — HM DIABETES EYE EXAM

## 2024-01-24 ENCOUNTER — Encounter: Payer: Self-pay | Admitting: *Deleted

## 2024-02-12 ENCOUNTER — Other Ambulatory Visit: Payer: Self-pay | Admitting: Family Medicine

## 2024-02-12 DIAGNOSIS — E782 Mixed hyperlipidemia: Secondary | ICD-10-CM

## 2024-02-17 ENCOUNTER — Ambulatory Visit (INDEPENDENT_AMBULATORY_CARE_PROVIDER_SITE_OTHER): Admitting: Student

## 2024-02-17 ENCOUNTER — Encounter: Payer: Self-pay | Admitting: Student

## 2024-02-17 VITALS — BP 126/72 | HR 68 | Temp 98.3°F | Resp 12 | Ht 68.0 in | Wt 237.2 lb

## 2024-02-17 DIAGNOSIS — R300 Dysuria: Secondary | ICD-10-CM | POA: Insufficient documentation

## 2024-02-17 LAB — POC URINALSYSI DIPSTICK (AUTOMATED)
Bilirubin, UA: NEGATIVE
Glucose, UA: NEGATIVE
Ketones, UA: NEGATIVE
Nitrite, UA: NEGATIVE
Protein, UA: NEGATIVE
Spec Grav, UA: 1.01 (ref 1.010–1.025)
Urobilinogen, UA: 0.2 U/dL
pH, UA: 6.5 (ref 5.0–8.0)

## 2024-02-17 MED ORDER — CIPROFLOXACIN HCL 250 MG PO TABS
ORAL_TABLET | ORAL | 0 refills | Status: DC
Start: 2024-02-17 — End: 2024-03-05

## 2024-02-17 NOTE — Progress Notes (Signed)
 Chief Complaint  Patient presents with   Dysuria   Urinary Frequency    Tasha Tucker is a 73 y.o. female here for possible UTI.  Duration: 1 day. Symptoms: Dysuria, urinary frequency and burning Denies: hematuria, urinary retention, fever, chills, flank pain on sweats, nausea, and vomiting, vaginal discharge Denies new sexual partners. History of UTI: yes, years ago.  Past Medical History:  Diagnosis Date   Bladder cancer (HCC)    UROLOGIST- DR [facility]   Breast cancer (HCC) 2021   right lumpectomy and chemo and radiation done   DJD (degenerative joint disease)    NECK, NEEDS PILLOW WHEN LYING FLAT IS POSSIBLE   History of cerebral aneurysm repair    1996-- S/P CLAPPING RIGHT CAROTID --- NO DEFICITS   History of renal pelvis cancer    10-09-2016  S/P  LEFT RADICAL NEPHROURETERECTOMY -- HIGH GRADE PAPILLARY UROTHELIAL CARCINOMA   Hyperlipidemia    Hypertension    Liver hemangioma    yrs ago per pt on 03-19-2021   OA (osteoarthritis)    Personal history of chemotherapy    Personal history of radiation therapy    PONV (postoperative nausea and vomiting)    Type 2 diabetes mellitus (HCC)    UTI (urinary tract infection)    finished antibiotics 03-01-2021 all symptoms resolved   Wears glasses 03/19/2021     BP 126/72 (BP Location: Left Arm, Patient Position: Sitting, Cuff Size: Large)   Pulse 68   Temp 98.3 F (36.8 C) (Oral)   Resp 12   Ht 5\' 8"  (1.727 m)   Wt 237 lb 3.2 oz (107.6 kg)   SpO2 98%   BMI 36.07 kg/m  General: Awake, alert, appears stated age Heart: RRR Lungs: CTAB, normal respiratory effort, no accessory muscle usage Abd: BS+, soft, NT, ND, no masses or organomegaly MSK: No CVA tenderness Psych: Age appropriate judgment and insight  Dysuria - Plan: POCT Urinalysis Dipstick (Automated), Urine Culture, ciprofloxacin  (CIPRO ) 250 MG tablet  Ensure adequate hydration. Seek immediate care if pt starts to develop fevers, new/worsening symptoms,  uncontrollable N/V. F/u prn. The patient voiced understanding and agreement to the plan.  [name], DNP, AGNP-C 02/17/24 10:46 AM

## 2024-02-17 NOTE — Progress Notes (Unsigned)
 Tasha Tucker

## 2024-02-19 LAB — URINE CULTURE
MICRO NUMBER:: 16489546
SPECIMEN QUALITY:: ADEQUATE

## 2024-02-21 ENCOUNTER — Ambulatory Visit: Payer: Self-pay | Admitting: Student

## 2024-02-22 ENCOUNTER — Ambulatory Visit (INDEPENDENT_AMBULATORY_CARE_PROVIDER_SITE_OTHER): Payer: Medicare Other | Admitting: Family Medicine

## 2024-02-22 ENCOUNTER — Encounter: Payer: Self-pay | Admitting: Family Medicine

## 2024-02-22 VITALS — BP 154/78 | HR 71 | Ht 68.0 in | Wt 239.5 lb

## 2024-02-22 DIAGNOSIS — R0981 Nasal congestion: Secondary | ICD-10-CM

## 2024-02-22 DIAGNOSIS — Z789 Other specified health status: Secondary | ICD-10-CM

## 2024-02-22 DIAGNOSIS — G4733 Obstructive sleep apnea (adult) (pediatric): Secondary | ICD-10-CM | POA: Diagnosis not present

## 2024-02-22 NOTE — Progress Notes (Signed)
 PATIENT: Tasha Tucker DOB: [birth year]  REASON FOR VISIT: follow up HISTORY FROM: patient  Chief Complaint  Patient presents with   RM2/CPAP    Pt is here Alone. Pt states things have been horrible with her CPAP Machine. Pt states that she was fighting to get to sleep last night due to her Machine. Pt states that the machine isn't reading right and said that she stopped breathing 44 times last, but she wasn't asleep.      HISTORY OF PRESENT ILLNESS:  02/22/24 ALL:  WALKER PADDACK is a 73 y.o. female here today for follow up for OSA on CPAP. She was last seen by Dr Omar Bibber 09/2023. Titration study 09/2023 was limited due to severe sleep fragmentation/poor sleep consolidation. Sleep efficiency 27.9%. Little supine sleep and no REM sleep. Auto BiPAP offered but declined by patient. She has continued autoPAP.   Since, she reports continued difficulty adjusting to therapy. She tries to use CPAP most every night but feels she gets frustrated after not being able to go to sleep and removes mask. She can, sometimes, fall asleep for a little while but wakes up and takes mask off. She has been worried about meeting compliance. She has not noted any specific benefit of using therapy. She does not want to switch to BiPAP due to being nervous that she will not be able to continue therapy and doesn't want to have to pay for machine. She is willing to continue working on compliance. She is most comfortable with nasal mask with memory foam. Her husband uses CPAP without difficulty.     HISTORY: (copied from Dr Dail Drought previous note)  Tasha Tucker is a 73 year old female with an underlying medical history of breast cancer with status post right lumpectomy and chemo-radiation, degenerative joint disease, cerebral aneurysm repair, hypertension, hyperlipidemia, osteoarthritis, liver hemangioma, type 2 diabetes, bladder cancer with status post left radical nephroureterectomy in 2019, and obesity, who presents  for follow-up consultation of her obstructive sleep apnea after interim testing and starting home AutoPap therapy.  The patient is unaccompanied today.  I first met her at the request of her primary care physician on 03/22/2023, at which time she reported snoring and witnessed apneas per husband's observation.  She was advised to proceed with a sleep study.  She had a baseline polysomnogram through our sleep lab on 07/12/2023 which showed severe obstructive sleep apnea with an AHI of 43.6/h, O2 nadir 81%.  She had moderate PLM's without significant arousals.  She was advised to proceed with home AutoPap therapy.  Set up date was 07/21/2023.  She has a ResMed air sense 11 AutoSet machine.  Her DME provider is [employer] [facility].  Today, 10/13/2023: I reviewed her AutoPap compliance data from 07/21/2023 through 08/19/2023, which is a total of 30 days, during which time she used her machine 29 days with percent use days greater than 4 hours at 73%, indicating adequate compliance with an average usage of 5 hours and 2 minutes for days of treatment, residual AHI elevated at 26.8/h with mild increase in central events noted.  95th percentile pressure at 10.8 cm with a range of 6 to 11 cm with EPR of 3.  Leak low with the 95th percentile at 2.2 L/min.  She reports difficulty with tolerance of her AutoPap.  She started with a fullface mask which was tolerable to use but she woke up with severe dry mouth and dry lips.  She is now on a nasal mask, small sized  Eson 2 from [employer], this mask cuts into her nose and is uncomfortable.  She was given a chinstrap as well.  She feels it difficult to continue with AutoPap therapy but is willing to continue to try.  She is humidifier set at them but then water  accumulated in the hose.  She was compliant in the first month but current month shows much less usage, average usage of 2 hours and 31 minutes, percent use days greater than 4 hours of 13%.    REVIEW OF SYSTEMS: Out of a  complete 14 system review of symptoms, the patient complains only of the following symptoms, nasal congestion and all other reviewed systems are negative.  ESS: 10/24  ALLERGIES: Allergies  Allergen Reactions   Other Other (See Comments)   Adhesive [Tape] Rash    PAPER TAPE ONLY to use   Chlorhexidine  Gluconate Itching and Rash    Topical antiseptic (CHG WIPES)    Dilantin [Phenytoin] Rash   Penicillins Rash    Has patient had a PCN reaction causing immediate rash, facial/tongue/throat swelling, SOB or lightheadedness with hypotension:unsure Has patient had a PCN reaction causing severe rash involving mucus membranes or skin necrosis:unsure Has patient had a PCN reaction that required hospitalization:No Has patient had a PCN reaction occurring within the last 10 years:No If all of the above answers are "NO", then may proceed with Cephalosporin use.    Sulfa Antibiotics Rash    HOME MEDICATIONS: Outpatient Medications Prior to Visit  Medication Sig Dispense Refill   acetaminophen  (TYLENOL ) 500 MG tablet Take 500 mg by mouth at bedtime.      anastrozole  (ARIMIDEX ) 1 MG tablet TAKE 1 TABLET BY MOUTH EVERY DAY 90 tablet 4   aspirin  EC 81 MG tablet Take 1 tablet (81 mg total) by mouth daily. Swallow whole. 90 tablet 3   BD PEN NEEDLE NANO U/F 32G X 4 MM MISC USE FOR INSULIN  ADMINISTRATION DAILY 100 each 3   Biotin 10 MG CAPS Take by mouth daily. 5000 mcg bid per pt 30 capsule    carvedilol  (COREG ) 3.125 MG tablet in the morning and at bedtime.     cetirizine (ZYRTEC) 10 MG tablet Take 10 mg by mouth every morning.     ciprofloxacin  (CIPRO ) 250 MG tablet Take 1 tablet (250 mg total) by mouth 2 (two) times daily for 3 days. May take up to 5 days if desired 10 tablet 0   Continuous Glucose Sensor (FREESTYLE LIBRE 3 PLUS SENSOR) MISC by Does not apply route. Change sensor every 15 days.     HUMALOG  KWIKPEN 100 UNIT/ML KwikPen Inject 7 Units into the skin 3 (three) times daily. Usually takes  6 units     insulin  glargine (LANTUS  SOLOSTAR) 100 UNIT/ML Solostar Pen Inject 26 Units into the skin every evening.     levothyroxine  (SYNTHROID ) 50 MCG tablet Take 1 tablet (50 mcg total) by mouth daily before breakfast. 90 tablet 3   pravastatin  (PRAVACHOL ) 40 MG tablet TAKE 1 TABLET BY MOUTH EVERY DAY 90 tablet 0   No facility-administered medications prior to visit.    PAST MEDICAL HISTORY: Past Medical History:  Diagnosis Date   Bladder cancer (HCC)    UROLOGIST- [facility]   Breast cancer (HCC) 2021   right lumpectomy and chemo and radiation done   DJD (degenerative joint disease)    NECK, NEEDS PILLOW WHEN LYING FLAT IS POSSIBLE   History of cerebral aneurysm repair    1996-- S/P CLAPPING RIGHT CAROTID ---  NO DEFICITS   History of renal pelvis cancer    10-09-2016  S/P  LEFT RADICAL NEPHROURETERECTOMY -- HIGH GRADE PAPILLARY UROTHELIAL CARCINOMA   Hyperlipidemia    Hypertension    Liver hemangioma    yrs ago per pt on 03-19-2021   OA (osteoarthritis)    Personal history of chemotherapy    Personal history of radiation therapy    PONV (postoperative nausea and vomiting)    Type 2 diabetes mellitus (HCC)    UTI (urinary tract infection)    finished antibiotics 03-01-2021 all symptoms resolved   Wears glasses 03/19/2021    PAST SURGICAL HISTORY: Past Surgical History:  Procedure Laterality Date   ABDOMINAL HYSTERECTOMY  1990   PARTIAL   BREAST LUMPECTOMY WITH RADIOACTIVE SEED AND SENTINEL LYMPH NODE BIOPSY Right 04/17/2020   Procedure: BREAST LUMPECTOMY X 2  WITH RADIOACTIVE SEED AND SENTINEL LYMPH NODE BIOPSY;  Surgeon: Oza Blumenthal, MD;  Location: [facility];  Service: General;  Laterality: Right;   CATARACT EXTRACTION W/ INTRAOCULAR LENS  IMPLANT, BILATERAL  2016; 2017   CEREBRAL ANEURYSM REPAIR  1996   clipped-no deficits RIGHT CAROTID   CHOLECYSTECTOMY  2010   laparoscopic   CYSTOSCOPY W/ RETROGRADES Right 06/09/2017   Procedure: CYSTOSCOPY WITH RETROGRADE  PYELOGRAM;  Surgeon: Osborn Blaze, MD;  Location: [facility];  Service: Urology;  Laterality: Right;   CYSTOSCOPY W/ RETROGRADES Right 07/21/2017   Procedure: CYSTOSCOPY WITH RETROGRADE PYELOGRAM;  Surgeon: Osborn Blaze, MD;  Location: [facility];  Service: Urology;  Laterality: Right;   CYSTOSCOPY WITH RETROGRADE PYELOGRAM, URETEROSCOPY AND STENT PLACEMENT Left 08/26/2016   Procedure: CYSTOSCOPY WITH BILATERAL RETROGRADE PYELOGRAM,LEFT URETEROSCOPY WITH BIOPSY AND LEFT STENT PLACEMENT;  Surgeon: Osborn Blaze, MD;  Location: [facility];  Service: Urology;  Laterality: Left;   PORT-A-CATH REMOVAL Left 03/24/2021   Procedure: PORT-A-CATH REMOVAL;  Surgeon: Oza Blumenthal, MD;  Location: [facility];  Service: General;  Laterality: Left;   PORTACATH PLACEMENT Left 06/18/2020   Procedure: INSERTION PORT-A-CATH WITH ULTRASOUND GUIDANCE;  Surgeon: Oza Blumenthal, MD;  Location: [facility];  Service: General;  Laterality: Left;   ROBOT ASSITED LAPAROSCOPIC NEPHROURETERECTOMY Left 10/09/2016   Procedure: XI ROBOT ASSITED LAPAROSCOPIC NEPHROURETERECTOMY;  Surgeon: Osborn Blaze, MD;  Location: [facility];  Service: Urology;  Laterality: Left;   SURGERY ON FOOT FOR STAPH INFECTION  1989   TRANSURETHRAL RESECTION OF BLADDER TUMOR N/A 07/21/2017   Procedure: TRANSURETHRAL RESECTION OF BLADDER TUMOR (TURBT);  Surgeon: Osborn Blaze, MD;  Location: [facility];  Service: Urology;  Laterality: N/A;   TRANSURETHRAL RESECTION OF BLADDER TUMOR WITH MITOMYCIN -C N/A 06/09/2017   Procedure: TRANSURETHRAL RESECTION OF BLADDER TUMOR WITH MITOMYCIN -C;  Surgeon: Osborn Blaze, MD;  Location: [facility];  Service: Urology;  Laterality: N/A;   TUBAL LIGATION  1980    FAMILY HISTORY: Family History  Problem Relation Age of Onset   Cancer Mother    Hyperlipidemia Father    Heart attack Father    Diabetes Brother     Breast cancer Neg Hx    Sleep apnea Neg Hx     SOCIAL HISTORY: Social History   Socioeconomic History   Marital status: Married    Spouse name: Not on file   Number of children: Not on file   Years of education: Not on file   Highest education level: Bachelor's degree (e.g., BA, AB, BS)  Occupational History   Not on file  Tobacco Use  Smoking status: Never   Smokeless tobacco: Never  Vaping Use   Vaping status: Never Used  Substance and Sexual Activity   Alcohol use: Not Currently    Comment: , STOPPED   Drug use: No   Sexual activity: Yes  Other Topics Concern   Not on file  Social History Narrative   Not on file   Social Drivers of Health   Financial Resource Strain: Low Risk  (10/27/2023)   Overall Financial Resource Strain (CARDIA)    Difficulty of Paying Living Expenses: Not hard at all  Food Insecurity: No Food Insecurity (10/27/2023)   Hunger Vital Sign    Worried About Running Out of Food in the Last Year: Never true    Ran Out of Food in the Last Year: Never true  Transportation Needs: No Transportation Needs (10/27/2023)   PRAPARE - Administrator, Civil Service (Medical): No    Lack of Transportation (Non-Medical): No  Physical Activity: Insufficiently Active (10/27/2023)   Exercise Vital Sign    Days of Exercise per Week: 2 days    Minutes of Exercise per Session: 30 min  Stress: No Stress Concern Present (10/27/2023)   [employer] of Occupational Health - Occupational Stress Questionnaire    Feeling of Stress : Not at all  Social Connections: Socially Integrated (10/27/2023)   Social Connection and Isolation Panel [NHANES]    Frequency of Communication with Friends and Family: More than three times a week    Frequency of Social Gatherings with Friends and Family: More than three times a week    Attends Religious Services: More than 4 times per year    Active Member of [employer] or Organizations: Yes    Attends Banker  Meetings: More than 4 times per year    Marital Status: Married  Catering manager Violence: Not At Risk (05/07/2021)   Humiliation, Afraid, Rape, and Kick questionnaire    Fear of Current or Ex-Partner: No    Emotionally Abused: No    Physically Abused: No    Sexually Abused: No     PHYSICAL EXAM  Vitals:   02/22/24 1319  BP: (!) 154/78  Pulse: 71  Weight: 239 lb 8 oz (108.6 kg)  Height: 5\' 8"  (1.727 m)   Body mass index is 36.42 kg/m.  Generalized: Well developed, in no acute distress  Cardiology: normal rate and rhythm, no murmur noted Respiratory: clear to auscultation bilaterally  Neurological examination  Mentation: Alert oriented to time, place, history taking. Follows all commands speech and language fluent Cranial nerve II-XII: Pupils were equal round reactive to light. Extraocular movements were full, visual field were full  Motor: The motor testing reveals 5 over 5 strength of all 4 extremities. Good symmetric motor tone is noted throughout.  Gait and station: Gait is normal.    DIAGNOSTIC DATA (LABS, IMAGING, TESTING) - I reviewed patient records, labs, notes, testing and imaging myself where available.      No data to display           Lab Results  Component Value Date   WBC 4.7 10/21/2023   HGB 14.5 10/21/2023   HCT 43 10/21/2023   MCV 92 10/21/2023   PLT 129 (A) 10/21/2023      Component Value Date/Time   NA 139 10/21/2023 0000   K 4.6 10/21/2023 0000   CL 104 10/21/2023 0000   CO2 26 (A) 10/21/2023 0000   GLUCOSE 171 (H) 05/04/2022 0923   BUN 29 (  A) 10/21/2023 0000   CREATININE 1.6 (A) 10/21/2023 0000   CREATININE 1.80 (H) 05/04/2022 0923   CREATININE 1.78 (H) 06/19/2020 0842   CALCIUM 10.0 10/21/2023 0000   PROT 6.7 05/04/2022 0923   ALBUMIN 4.1 10/21/2023 0000   AST 25 05/04/2022 0923   AST 24 06/19/2020 0842   ALT 28 05/04/2022 0923   ALT 30 06/19/2020 0842   ALKPHOS 3.3 (A) 04/21/2023 0000   BILITOT 0.6 05/04/2022 0923   BILITOT  0.5 06/19/2020 0842   GFRNONAA 28 (L) 06/16/2021 1029   GFRNONAA 28 (L) 06/19/2020 0842   GFRAA 34 (L) 06/26/2020 1535   GFRAA 33 (L) 06/19/2020 0842   Lab Results  Component Value Date   CHOL 152 11/02/2022   HDL 52.60 11/02/2022   LDLCALC 61 11/02/2022   LDLDIRECT 86.0 04/30/2021   TRIG 191.0 (H) 11/02/2022   CHOLHDL 3 11/02/2022   Lab Results  Component Value Date   HGBA1C 7.8 (H) 06/17/2020   No results found for: "VITAMINB12" Lab Results  Component Value Date   TSH 2.48 11/03/2023     ASSESSMENT AND PLAN 73 y.o. year old female  has a past medical history of Bladder cancer (HCC), Breast cancer (HCC) (2021), DJD (degenerative joint disease), History of cerebral aneurysm repair, History of renal pelvis cancer, Hyperlipidemia, Hypertension, Liver hemangioma, OA (osteoarthritis), Personal history of chemotherapy, Personal history of radiation therapy, PONV (postoperative nausea and vomiting), Type 2 diabetes mellitus (HCC), UTI (urinary tract infection), and Wears glasses (03/19/2021). here with     ICD-10-CM   1. OSA on CPAP  G47.33     2. Difficulty with CPAP nasal mask use  Z78.9         Tasha Tucker is doing well on CPAP therapy. Compliance report reveals acceptable daily but sub optimal four hour compliance. We have discussed ways to help adjust therapy. She is willing to continue working on compliance. She was encouraged to continue using CPAP nightly and for greater than 4 hours each night. We will update supply orders as indicated. Risks of untreated sleep apnea review and education materials provided. Healthy lifestyle habits encouraged. She will follow up in 4-6 months, sooner if needed. She verbalizes understanding and agreement with this plan.    No orders of the defined types were placed in this encounter.    No orders of the defined types were placed in this encounter.    I spent 30 minutes of face-to-face and non-face-to-face time with patient.  This  included previsit chart review, lab review, study review, order entry, electronic health record documentation, patient education.   Terrilyn Fick, FNP-C 02/22/2024, 3:57 PM [facility] [address], [address] [place], [place] [address] [phone]

## 2024-02-22 NOTE — Patient Instructions (Addendum)
 Please continue using your CPAP regularly. While your insurance requires that you use CPAP at least 4 hours each night on 70% of the nights, I recommend, that you not skip any nights and use it throughout the night if you can. Getting used to CPAP and staying with the treatment long term does take time and patience and discipline. Untreated obstructive sleep apnea when it is moderate to severe can have an adverse impact on cardiovascular health and raise her risk for heart disease, arrhythmias, hypertension, congestive heart failure, stroke and diabetes. Untreated obstructive sleep apnea causes sleep disruption, nonrestorative sleep, and sleep deprivation. This can have an impact on your day to day functioning and cause daytime sleepiness and impairment of cognitive function, memory loss, mood disturbance, and problems focussing. Using CPAP regularly can improve these symptoms.  We will keep working on adjusting to therapy. Make small goals and increase time each week. Use nasal mask if you like. Try Biotene for dry mouth.   Follow up in 4-6 months

## 2024-03-05 ENCOUNTER — Encounter (INDEPENDENT_AMBULATORY_CARE_PROVIDER_SITE_OTHER): Payer: Self-pay | Admitting: Family Medicine

## 2024-03-05 DIAGNOSIS — R300 Dysuria: Secondary | ICD-10-CM

## 2024-03-05 MED ORDER — CIPROFLOXACIN HCL 250 MG PO TABS: ORAL_TABLET | ORAL | 0 refills | Status: DC

## 2024-03-05 NOTE — Addendum Note (Signed)
 Addended by: Gates Kasal C on: 03/05/2024 08:20 PM   Modules accepted: Orders

## 2024-03-06 ENCOUNTER — Other Ambulatory Visit (INDEPENDENT_AMBULATORY_CARE_PROVIDER_SITE_OTHER)

## 2024-03-06 ENCOUNTER — Other Ambulatory Visit

## 2024-03-06 DIAGNOSIS — R300 Dysuria: Secondary | ICD-10-CM

## 2024-03-06 NOTE — Addendum Note (Signed)
 Addended by: Gates Kasal C on: 03/06/2024 08:52 AM   Modules accepted: Orders

## 2024-03-06 NOTE — Telephone Encounter (Signed)

## 2024-03-07 LAB — URINE CULTURE
MICRO NUMBER:: 16555448
Result:: NO GROWTH
SPECIMEN QUALITY:: ADEQUATE

## 2024-03-23 ENCOUNTER — Encounter: Payer: Self-pay | Admitting: Family Medicine

## 2024-04-10 LAB — HM DIABETES FOOT EXAM

## 2024-04-10 LAB — HEMOGLOBIN A1C: Hemoglobin A1C: 7.2

## 2024-04-17 ENCOUNTER — Encounter: Payer: Self-pay | Admitting: Family Medicine

## 2024-04-20 LAB — BASIC METABOLIC PANEL WITH GFR
BUN: 29 — AB (ref 4–21)
CO2: 25 — AB (ref 13–22)
Chloride: 102 (ref 99–108)
Creatinine: 1.6 — AB (ref 0.5–1.1)
Potassium: 5.1 meq/L (ref 3.5–5.1)
Sodium: 139 (ref 137–147)

## 2024-04-20 LAB — COMPREHENSIVE METABOLIC PANEL WITH GFR
Albumin, Urine POC: 17.4
Albumin: 4.3 (ref 3.5–5.0)
Calcium: 10.1 (ref 8.7–10.7)
Creatinine, POC: 41.1 mg/dL
Microalb Creat Ratio: 42
eGFR: 34

## 2024-04-28 NOTE — Patient Instructions (Addendum)
 It was good to see you again today!  I will be in touch with your labs- please see me in about 6 months assuming all is well  Have fun on your upcoming travels!   Flu and covid boosters this fall

## 2024-04-28 NOTE — Progress Notes (Signed)
 [facility] at [facility] [address] [place], [place] [address] [phone]  Date:  05/03/2024   Name:  Tasha Tucker   DOB:  02-14-1951   MRN:  [id number]  PCP:  Watt Harlene BROCKS, MD    Chief Complaint: Annual Exam   History of Present Illness:  Tasha Tucker is a 73 y.o. very pleasant female patient who presents with the following:  Patient seen today for physical exam.  Most recent visit with me was in February of this year She also follows up regularly with several other specialists Her last visit she had recently been diagnosed with sleep apnea and was trying to get used to CPAP  History of breast cancer diagnosed 2021-her oncologist is Dr. Layla. She was treated with definitive surgical management, completed radiation and is currently taking anastrozole -we will use this for 5 years.   Also history of diabetes, hyperlipidemia, stage IV chronic kidney disease, renal and bladder cancer, cerebral aneurysm status post repair, hypertension She had a left nephrectomy in 2018, brain aneurysm clipping in 1990 Her urologist is Dr. Ginger will see him in September  Nephrologist Dr. Idelia was seen just a couple of weeks ago, I have reviewed her most recent note Cardiology, most recent visit was in October Endocrinology is managing her insulin -dependent diabetes-Noeli Lavery Easley at [facility]  Endocrinology is managing her A1c but not her thyroid  Lab Results  Component Value Date   HGBA1C 7.2 04/10/2024   Lab Results  Component Value Date   TSH 2.48 11/03/2023   Anastrozole  1 mg daily Aspirin  81 mg Carvedilol  Humalog  with meals Lantus  26 mg daily Levothyroxine  50 Pravastatin  40  Most recent DEXA scan September 2024-normal She had a diagnostic mammogram August 1 year ago which was reassuring.  Per notes she can either return to his regular screening or continue diagnostic per her preference- this is scheudled    Patient Active Problem List   Diagnosis Date Noted   Dysuria 02/17/2024   Obstructive sleep apnea 10/30/2023   Hypothyroidism 04/30/2021   Thrombocytopenia (HCC) 02/17/2021   Port-A-Cath in place 07/11/2020   Malignant neoplasm of upper-outer quadrant of right breast in female, estrogen receptor positive (HCC) 03/28/2020   Controlled type 2 diabetes mellitus with complication, with long-term current use of insulin  (HCC) 03/19/2018   Hyperlipidemia 01/12/2018   Cerebral aneurysm 12/29/2017   Secondary hypertension, unspecified 12/29/2017   CKD (chronic kidney disease) stage 4, GFR 15-29 ml/min (HCC) 12/29/2017   Cancer of renal pelvis, 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   Renal mass 10/09/2016    Past Medical History:  Diagnosis Date   Bladder cancer (HCC)    UROLOGIST- DR [facility]   Breast cancer (HCC) 2021   right lumpectomy and chemo and radiation done   DJD (degenerative joint disease)    NECK, NEEDS PILLOW WHEN LYING FLAT IS POSSIBLE   History of cerebral aneurysm repair    1996-- S/P CLAPPING RIGHT CAROTID --- NO DEFICITS   History of renal pelvis cancer    10-09-2016  S/P  LEFT RADICAL NEPHROURETERECTOMY -- HIGH GRADE PAPILLARY UROTHELIAL CARCINOMA   Hyperlipidemia    Hypertension    Liver hemangioma    yrs ago per pt on 03-19-2021   OA (osteoarthritis)    Personal history of chemotherapy    Personal history of radiation therapy    PONV (postoperative nausea and vomiting)    Type 2 diabetes mellitus (HCC)    UTI (urinary tract  infection)    finished antibiotics 03-01-2021 all symptoms resolved   Wears glasses 03/19/2021    Past Surgical History:  Procedure Laterality Date   ABDOMINAL HYSTERECTOMY  1990   PARTIAL   BREAST LUMPECTOMY WITH RADIOACTIVE SEED AND SENTINEL LYMPH NODE BIOPSY Right 04/17/2020   Procedure: BREAST LUMPECTOMY X 2  WITH RADIOACTIVE SEED AND SENTINEL LYMPH NODE BIOPSY;  Surgeon: Vernetta Berg, MD;  Location: [facility];  Service: General;  Laterality: Right;    CATARACT EXTRACTION W/ INTRAOCULAR LENS  IMPLANT, BILATERAL  2016; 2017   CEREBRAL ANEURYSM REPAIR  1996   clipped-no deficits RIGHT CAROTID   CHOLECYSTECTOMY  2010   laparoscopic   CYSTOSCOPY W/ RETROGRADES Right 06/09/2017   Procedure: CYSTOSCOPY WITH RETROGRADE PYELOGRAM;  Surgeon: Alvaro Hummer, MD;  Location: [facility];  Service: Urology;  Laterality: Right;   CYSTOSCOPY W/ RETROGRADES Right 07/21/2017   Procedure: CYSTOSCOPY WITH RETROGRADE PYELOGRAM;  Surgeon: Alvaro Hummer, MD;  Location: [facility];  Service: Urology;  Laterality: Right;   CYSTOSCOPY WITH RETROGRADE PYELOGRAM, URETEROSCOPY AND STENT PLACEMENT Left 08/26/2016   Procedure: CYSTOSCOPY WITH BILATERAL RETROGRADE PYELOGRAM,LEFT URETEROSCOPY WITH BIOPSY AND LEFT STENT PLACEMENT;  Surgeon: Hummer Alvaro, MD;  Location: [facility];  Service: Urology;  Laterality: Left;   PORT-A-CATH REMOVAL Left 03/24/2021   Procedure: PORT-A-CATH REMOVAL;  Surgeon: Vernetta Berg, MD;  Location: [facility];  Service: General;  Laterality: Left;   PORTACATH PLACEMENT Left 06/18/2020   Procedure: INSERTION PORT-A-CATH WITH ULTRASOUND GUIDANCE;  Surgeon: Vernetta Berg, MD;  Location: [facility];  Service: General;  Laterality: Left;   ROBOT ASSITED LAPAROSCOPIC NEPHROURETERECTOMY Left 10/09/2016   Procedure: XI ROBOT ASSITED LAPAROSCOPIC NEPHROURETERECTOMY;  Surgeon: Hummer Alvaro, MD;  Location: [facility];  Service: Urology;  Laterality: Left;   SURGERY ON FOOT FOR STAPH INFECTION  1989   TRANSURETHRAL RESECTION OF BLADDER TUMOR N/A 07/21/2017   Procedure: TRANSURETHRAL RESECTION OF BLADDER TUMOR (TURBT);  Surgeon: Alvaro Hummer, MD;  Location: [facility];  Service: Urology;  Laterality: N/A;   TRANSURETHRAL RESECTION OF BLADDER TUMOR WITH MITOMYCIN -C N/A 06/09/2017   Procedure: TRANSURETHRAL RESECTION OF BLADDER TUMOR WITH MITOMYCIN -C;  Surgeon: Alvaro Hummer, MD;  Location: [facility];  Service: Urology;  Laterality: N/A;   TUBAL LIGATION  1980    Social History   Tobacco Use   Smoking status: Never   Smokeless tobacco: Never  Vaping Use   Vaping status: Never Used  Substance Use Topics   Alcohol use: Not Currently    Comment: , STOPPED   Drug use: No    Family History  Problem Relation Age of Onset   Cancer Mother    Hyperlipidemia Father    Heart attack Father    Diabetes Brother    Breast cancer Neg Hx    Sleep apnea Neg Hx     Allergies  Allergen Reactions   Other Other (See Comments)   Adhesive [Tape] Rash    PAPER TAPE ONLY to use   Chlorhexidine  Gluconate Itching and Rash    Topical antiseptic (CHG WIPES)    Dilantin [Phenytoin] Rash   Penicillins Rash    Has patient had a PCN reaction causing immediate rash, facial/tongue/throat swelling, SOB or lightheadedness with hypotension:unsure Has patient had a PCN reaction causing severe rash involving mucus membranes or skin necrosis:unsure Has patient had a PCN reaction that required hospitalization:No Has patient had a PCN reaction occurring within the last 10 years:No If all  of the above answers are NO, then may proceed with Cephalosporin use.    Sulfa Antibiotics Rash    Medication list has been reviewed and updated.  Current Outpatient Medications on File Prior to Visit  Medication Sig Dispense Refill   acetaminophen  (TYLENOL ) 500 MG tablet Take 500 mg by mouth at bedtime.      anastrozole  (ARIMIDEX ) 1 MG tablet TAKE 1 TABLET BY MOUTH EVERY DAY 90 tablet 4   aspirin  EC 81 MG tablet Take 1 tablet (81 mg total) by mouth daily. Swallow whole. 90 tablet 3   BD PEN NEEDLE NANO U/F 32G X 4 MM MISC USE FOR INSULIN  ADMINISTRATION DAILY 100 each 3   Biotin 10 MG CAPS Take by mouth daily. 5000 mcg bid per pt 30 capsule    carvedilol  (COREG ) 3.125 MG tablet in the morning and at bedtime.     cetirizine (ZYRTEC) 10 MG tablet Take 10 mg by mouth  every morning.     Continuous Glucose Sensor (FREESTYLE LIBRE 3 PLUS SENSOR) MISC by Does not apply route. Change sensor every 15 days.     HUMALOG  KWIKPEN 100 UNIT/ML KwikPen Inject 7 Units into the skin 3 (three) times daily. Usually takes 6 units     insulin  glargine (LANTUS  SOLOSTAR) 100 UNIT/ML Solostar Pen Inject 26 Units into the skin every evening.     levothyroxine  (SYNTHROID ) 50 MCG tablet Take 1 tablet (50 mcg total) by mouth daily before breakfast. 90 tablet 3   pravastatin  (PRAVACHOL ) 40 MG tablet TAKE 1 TABLET BY MOUTH EVERY DAY 90 tablet 0   No current facility-administered medications on file prior to visit.    Review of Systems:  As per HPI- otherwise negative.   Physical Examination: Vitals:   05/03/24 0954  BP: 120/78  Pulse: 64  Temp: 98.5 F (36.9 C)  SpO2: 97%   Vitals:   05/03/24 0954  Weight: 235 lb 9.6 oz (106.9 kg)  Height: 5' 8 (1.727 m)   Body mass index is 35.82 kg/m. Ideal Body Weight: Weight in (lb) to have BMI = 25: 164.1  GEN: no acute distress.  Overweight, looks well HEENT: Atraumatic, Normocephalic.  Bilateral TM wnl, oropharynx normal.  PEERL,EOMI.    Ears and Nose: No external deformity. CV: RRR, No M/G/R. No JVD. No thrill. No extra heart sounds. PULM: CTA B, no wheezes, crackles, rhonchi. No retractions. No resp. distress. No accessory muscle use. ABD: S, NT, ND, +BS. No rebound. No HSM. EXTR: No c/c/e PSYCH: Normally interactive. Conversant.    Assessment and Plan: Mixed hyperlipidemia - Plan: Lipid panel, pravastatin  (PRAVACHOL ) 40 MG tablet  CKD (chronic kidney disease) stage 4, GFR 15-29 ml/min (HCC) - Plan: CANCELED: Comprehensive metabolic panel with GFR  Hypothyroidism (acquired) - Plan: TSH  Controlled type 2 diabetes mellitus with complication, with long-term current use of insulin  (HCC)  Obstructive sleep apnea  Screening for deficiency anemia - Plan: CBC  Medication monitoring encounter - Plan: Hepatic  function panel  Patient seen today for routine follow-up.  She has managed by multiple specialists as described above Blood work pending as above- Will plan further follow- up pending labs. I am managing her thyroid , check TSH today and make any adjustments that are needed Recommend flu and COVID boosters this fall Signed Harlene Schroeder, MD  Received labs, message to patient  Results for orders placed or performed in visit on 05/03/24  TSH   Collection Time: 05/03/24 10:16 AM  Result Value Ref Range   TSH  1.36 0.35 - 5.50 uIU/mL  Lipid panel   Collection Time: 05/03/24 10:16 AM  Result Value Ref Range   Cholesterol 146 0 - 200 mg/dL   Triglycerides 856.9 0.0 - 149.0 mg/dL   HDL 45.69 >60.99 mg/dL   VLDL 71.3 0.0 - 59.9 mg/dL   LDL Cholesterol 63 0 - 99 mg/dL   Total CHOL/HDL Ratio 3    NonHDL 91.41   CBC   Collection Time: 05/03/24 10:16 AM  Result Value Ref Range   WBC 4.6 4.0 - 10.5 K/uL   RBC 4.77 3.87 - 5.11 Mil/uL   Platelets 142.0 (L) 150.0 - 400.0 K/uL   Hemoglobin 14.5 12.0 - 15.0 g/dL   HCT 56.3 63.9 - 53.9 %   MCV 91.4 78.0 - 100.0 fl   MCHC 33.3 30.0 - 36.0 g/dL   RDW 86.3 88.4 - 84.4 %

## 2024-05-03 ENCOUNTER — Ambulatory Visit (INDEPENDENT_AMBULATORY_CARE_PROVIDER_SITE_OTHER): Payer: Medicare Other | Admitting: Family Medicine

## 2024-05-03 ENCOUNTER — Encounter: Payer: Self-pay | Admitting: Family Medicine

## 2024-05-03 VITALS — BP 120/78 | HR 64 | Temp 98.5°F | Ht 68.0 in | Wt 235.6 lb

## 2024-05-03 DIAGNOSIS — Z5181 Encounter for therapeutic drug level monitoring: Secondary | ICD-10-CM

## 2024-05-03 DIAGNOSIS — E782 Mixed hyperlipidemia: Secondary | ICD-10-CM

## 2024-05-03 DIAGNOSIS — Z794 Long term (current) use of insulin: Secondary | ICD-10-CM

## 2024-05-03 DIAGNOSIS — N184 Chronic kidney disease, stage 4 (severe): Secondary | ICD-10-CM | POA: Diagnosis not present

## 2024-05-03 DIAGNOSIS — E118 Type 2 diabetes mellitus with unspecified complications: Secondary | ICD-10-CM

## 2024-05-03 DIAGNOSIS — G4733 Obstructive sleep apnea (adult) (pediatric): Secondary | ICD-10-CM

## 2024-05-03 DIAGNOSIS — Z130 Encounter for screening for diseases of the blood and blood-forming organs and certain disorders involving the immune mechanism: Secondary | ICD-10-CM

## 2024-05-03 DIAGNOSIS — E039 Hypothyroidism, unspecified: Secondary | ICD-10-CM | POA: Diagnosis not present

## 2024-05-03 LAB — CBC
HCT: 43.6 % (ref 36.0–46.0)
Hemoglobin: 14.5 g/dL (ref 12.0–15.0)
MCHC: 33.3 g/dL (ref 30.0–36.0)
MCV: 91.4 fl (ref 78.0–100.0)
Platelets: 142 K/uL — ABNORMAL LOW (ref 150.0–400.0)
RBC: 4.77 Mil/uL (ref 3.87–5.11)
RDW: 13.6 % (ref 11.5–15.5)
WBC: 4.6 K/uL (ref 4.0–10.5)

## 2024-05-03 LAB — LIPID PANEL
Cholesterol: 146 mg/dL (ref 0–200)
HDL: 54.3 mg/dL (ref >39.00)
LDL Cholesterol: 63 mg/dL (ref 0–99)
NonHDL: 91.41
Total CHOL/HDL Ratio: 3
Triglycerides: 143 mg/dL (ref 0.0–149.0)
VLDL: 28.6 mg/dL (ref 0.0–40.0)

## 2024-05-03 LAB — TSH: TSH: 1.36 u[IU]/mL (ref 0.35–5.50)

## 2024-05-03 MED ORDER — PRAVASTATIN SODIUM 40 MG PO TABS: 40.0000 mg | ORAL_TABLET | Freq: Every day | ORAL | 3 refills | Status: AC

## 2024-05-04 ENCOUNTER — Ambulatory Visit
Admission: RE | Admit: 2024-05-04 | Discharge: 2024-05-04 | Disposition: A | Discharge status: Home / Self Care | Source: Ambulatory Visit | Attending: Hematology and Oncology

## 2024-05-04 DIAGNOSIS — Z170 Estrogen receptor positive status [ER+]: Secondary | ICD-10-CM

## 2024-05-19 ENCOUNTER — Ambulatory Visit (INDEPENDENT_AMBULATORY_CARE_PROVIDER_SITE_OTHER): Admitting: Physician Assistant

## 2024-05-19 ENCOUNTER — Encounter: Payer: Self-pay | Admitting: Physician Assistant

## 2024-05-19 VITALS — BP 130/79 | HR 61 | Ht 68.0 in | Wt 236.4 lb

## 2024-05-19 DIAGNOSIS — N3001 Acute cystitis with hematuria: Secondary | ICD-10-CM | POA: Diagnosis not present

## 2024-05-19 DIAGNOSIS — R300 Dysuria: Secondary | ICD-10-CM | POA: Diagnosis not present

## 2024-05-19 LAB — POC URINALSYSI DIPSTICK (AUTOMATED)
Bilirubin, UA: NEGATIVE
Glucose, UA: NEGATIVE
Ketones, UA: NEGATIVE
Nitrite, UA: NEGATIVE
Protein, UA: NEGATIVE
Spec Grav, UA: <=1.005 — AB (ref 1.010–1.025)
Urobilinogen, UA: 0.2 U/dL
pH, UA: 6.5 (ref 5.0–8.0)

## 2024-05-19 MED ORDER — CEPHALEXIN 500 MG PO CAPS: 500.0000 mg | ORAL_CAPSULE | Freq: Two times a day (BID) | ORAL | 0 refills | Status: AC

## 2024-05-19 NOTE — Progress Notes (Signed)
 Established patient visit   Patient: Tasha Tucker   DOB: 07/16/1951   73 y.o. Female  MRN: [id number] Visit Date: 05/19/2024  Today's healthcare provider: Manuelita Flatness, PA-C   Chief Complaint  Patient presents with   Dysuria    Started Wed night   Subjective     Pt reports dysuria, frequency, urgency x 2 days. Denies new abdominal pain, low back pain, fevers.  Medications: Outpatient Medications Prior to Visit  Medication Sig   acetaminophen  (TYLENOL ) 500 MG tablet Take 500 mg by mouth at bedtime.    anastrozole  (ARIMIDEX ) 1 MG tablet TAKE 1 TABLET BY MOUTH EVERY DAY   aspirin  EC 81 MG tablet Take 1 tablet (81 mg total) by mouth daily. Swallow whole.   BD PEN NEEDLE NANO U/F 32G X 4 MM MISC USE FOR INSULIN  ADMINISTRATION DAILY   Biotin 10 MG CAPS Take by mouth daily. 5000 mcg bid per pt   carvedilol  (COREG ) 3.125 MG tablet in the morning and at bedtime.   cetirizine (ZYRTEC) 10 MG tablet Take 10 mg by mouth every morning.   Continuous Glucose Sensor (FREESTYLE LIBRE 3 PLUS SENSOR) MISC by Does not apply route. Change sensor every 15 days.   HUMALOG  KWIKPEN 100 UNIT/ML KwikPen Inject 7 Units into the skin 3 (three) times daily. Usually takes 6 units   insulin  glargine (LANTUS  SOLOSTAR) 100 UNIT/ML Solostar Pen Inject 26 Units into the skin every evening.   levothyroxine  (SYNTHROID ) 50 MCG tablet Take 1 tablet (50 mcg total) by mouth daily before breakfast.   pravastatin  (PRAVACHOL ) 40 MG tablet Take 1 tablet (40 mg total) by mouth daily.   No facility-administered medications prior to visit.    Review of Systems  Constitutional:  Negative for fatigue and fever.  Respiratory:  Negative for cough and shortness of breath.   Cardiovascular:  Negative for chest pain and leg swelling.  Gastrointestinal:  Negative for abdominal pain.  Genitourinary:  Positive for dysuria, frequency and urgency.  Neurological:  Negative for dizziness and headaches.       Objective     BP 130/79   Pulse 61   Ht 5' 8 (1.727 m)   Wt 236 lb 6.4 oz (107.2 kg)   BMI 35.94 kg/m    Physical Exam Constitutional:      General: She is awake.     Appearance: She is well-developed.  HENT:     Head: Normocephalic.  Eyes:     Conjunctiva/sclera: Conjunctivae normal.  Cardiovascular:     Rate and Rhythm: Normal rate and regular rhythm.     Heart sounds: Normal heart sounds.  Pulmonary:     Effort: Pulmonary effort is normal.  Abdominal:     Tenderness: There is no right CVA tenderness or left CVA tenderness.  Skin:    General: Skin is warm.  Neurological:     Mental Status: She is alert and oriented to person, place, and time.  Psychiatric:        Attention and Perception: Attention normal.        Mood and Affect: Mood normal.        Speech: Speech normal.        Behavior: Behavior is cooperative.      Results for orders placed or performed in visit on 05/19/24  POCT Urinalysis Dipstick (Automated)  Result Value Ref Range   Color, UA yellow    Clarity, UA slightly cloudy    Glucose, UA Negative Negative  Bilirubin, UA neg    Ketones, UA neg    Spec Grav, UA <=1.005 (A) 1.010 - 1.025   Blood, UA large    pH, UA 6.5 5.0 - 8.0   Protein, UA Negative Negative   Urobilinogen, UA 0.2 0.2 or 1.0 E.U./dL   Nitrite, UA neg    Leukocytes, UA Large (3+) (A) Negative    Assessment & Plan    Dysuria -     POCT Urinalysis Dipstick (Automated)  Acute cystitis with hematuria -     Urine Culture -     Cephalexin ; Take 1 capsule (500 mg total) by mouth 2 (two) times daily for 5 days.  Dispense: 10 capsule; Refill: 0  UA + sending for culture, tx with keflex  500 mg bid x 5 days On chart review pt has taken keflex  before with pcn allergy.  Calc CrCl is 51.   Return if symptoms worsen or fail to improve.       Manuelita Flatness, PA-C  [facility] at [facility] [phone] (phone) [phone] (fax)  [facility]

## 2024-05-20 LAB — URINE CULTURE
MICRO NUMBER:: 16870440
SPECIMEN QUALITY:: ADEQUATE

## 2024-05-22 ENCOUNTER — Ambulatory Visit: Payer: Self-pay | Admitting: Physician Assistant

## 2024-06-01 NOTE — Patient Instructions (Addendum)
 Please continue using your CPAP regularly. While your insurance requires that you use CPAP at least 4 hours each night on 70% of the nights, I recommend, that you not skip any nights and use it throughout the night if you can. Getting used to CPAP and staying with the treatment long term does take time and patience and discipline. Untreated obstructive sleep apnea when it is moderate to severe can have an adverse impact on cardiovascular health and raise her risk for heart disease, arrhythmias, hypertension, congestive heart failure, stroke and diabetes. Untreated obstructive sleep apnea causes sleep disruption, nonrestorative sleep, and sleep deprivation. This can have an impact on your day to day functioning and cause daytime sleepiness and impairment of cognitive function, memory loss, mood disturbance, and problems focussing. Using CPAP regularly can improve these symptoms.  We will update supply orders, today. Try wedge pillow. It could help with some of the obstructive events. We will inquire about cost of BiPAP through [employer]. I will message you with details.   Follow up in 1 year if you continue CPAP. We will check in sooner pending switch to BiPAP.

## 2024-06-01 NOTE — Progress Notes (Signed)
 PATIENT: Tasha Tucker DOB: 1950/12/31  REASON FOR VISIT: follow up HISTORY FROM: patient  Chief Complaint  Patient presents with   Follow-up    Pt in room 2. Alone. Here for cpap follow up. Pt reports dong well on cpap machine.      HISTORY OF PRESENT ILLNESS:  06/05/24 ALL:  Tasha Tucker returns for follow up for OSA on CPAP. She was last seen by me 01/2024 and was having difficulty adjusting to CPAP therapy. Since, she reports doing a little better. She is using CPAP most every night but admits that after 3-4 hours, she has excessively dry mouth and unable to continue therapy. She is using a nasal mask with memory foam base. She reports headgear has plastic supports that are uncomfortable. She was hesitant to consider BiPAP therapy following CPAP titration due to concerns of not being able to meet compliance requirements. She is willing to reconsider if she does not have to repeat sleep study. She has not noted any significant benefit in sleep quality on therapy. ESS 2.     02/22/2024 ALL:  Tasha Tucker is a 73 y.o. female here today for follow up for OSA on CPAP. She was last seen by Dr Buck 09/2023. Titration study 09/2023 was limited due to severe sleep fragmentation/poor sleep consolidation. Sleep efficiency 27.9%. Little supine sleep and no REM sleep. Auto BiPAP offered but declined by patient. She has continued autoPAP.   Since, she reports continued difficulty adjusting to therapy. She tries to use CPAP most every night but feels she gets frustrated after not being able to go to sleep and removes mask. She can, sometimes, fall asleep for a little while but wakes up and takes mask off. She has been worried about meeting compliance. She has not noted any specific benefit of using therapy. She does not want to switch to BiPAP due to being nervous that she will not be able to continue therapy and doesn't want to have to pay for machine. She is willing to continue working on compliance. She  is most comfortable with nasal mask with memory foam. Her husband uses CPAP without difficulty.     HISTORY: (copied from Dr Obie previous note)  Tasha Tucker is a 73 year old female with an underlying medical history of breast cancer with status post right lumpectomy and chemo-radiation, degenerative joint disease, cerebral aneurysm repair, hypertension, hyperlipidemia, osteoarthritis, liver hemangioma, type 2 diabetes, bladder cancer with status post left radical nephroureterectomy in 2019, and obesity, who presents for follow-up consultation of her obstructive sleep apnea after interim testing and starting home AutoPap therapy.  The patient is unaccompanied today.  I first met her at the request of her primary care physician on 03/22/2023, at which time she reported snoring and witnessed apneas per husband's observation.  She was advised to proceed with a sleep study.  She had a baseline polysomnogram through our sleep lab on 07/12/2023 which showed severe obstructive sleep apnea with an AHI of 43.6/h, O2 nadir 81%.  She had moderate PLM's without significant arousals.  She was advised to proceed with home AutoPap therapy.  Set up date was 07/21/2023.  She has a ResMed air sense 11 AutoSet machine.  Her DME provider is [facility].  Today, 10/13/2023: I reviewed her AutoPap compliance data from 07/21/2023 through 08/19/2023, which is a total of 30 days, during which time she used her machine 29 days with percent use days greater than 4 hours at 73%, indicating adequate compliance with an  average usage of 5 hours and 2 minutes for days of treatment, residual AHI elevated at 26.8/h with mild increase in central events noted.  95th percentile pressure at 10.8 cm with a range of 6 to 11 cm with EPR of 3.  Leak low with the 95th percentile at 2.2 L/min.  She reports difficulty with tolerance of her AutoPap.  She started with a fullface mask which was tolerable to use but she woke up with severe dry mouth and  dry lips.  She is now on a nasal mask, small sized Eson 2 from [employer], this mask cuts into her nose and is uncomfortable.  She was given a chinstrap as well.  She feels it difficult to continue with AutoPap therapy but is willing to continue to try.  She is humidifier set at them but then water  accumulated in the hose.  She was compliant in the first month but current month shows much less usage, average usage of 2 hours and 31 minutes, percent use days greater than 4 hours of 13%.    REVIEW OF SYSTEMS: Out of a complete 14 system review of symptoms, the patient complains only of the following symptoms, nasal congestion and all other reviewed systems are negative.  ESS: 2/24  ALLERGIES: Allergies  Allergen Reactions   Other Other (See Comments)   Adhesive [Tape] Rash    PAPER TAPE ONLY to use   Chlorhexidine  Gluconate Itching and Rash    Topical antiseptic (CHG WIPES)    Dilantin [Phenytoin] Rash   Penicillins Rash    Has patient had a PCN reaction causing immediate rash, facial/tongue/throat swelling, SOB or lightheadedness with hypotension:unsure Has patient had a PCN reaction causing severe rash involving mucus membranes or skin necrosis:unsure Has patient had a PCN reaction that required hospitalization:No Has patient had a PCN reaction occurring within the last 10 years:No If all of the above answers are NO, then may proceed with Cephalosporin use.    Sulfa Antibiotics Rash    HOME MEDICATIONS: Outpatient Medications Prior to Visit  Medication Sig Dispense Refill   acetaminophen  (TYLENOL ) 500 MG tablet Take 500 mg by mouth at bedtime.      anastrozole  (ARIMIDEX ) 1 MG tablet TAKE 1 TABLET BY MOUTH EVERY DAY 90 tablet 4   aspirin  EC 81 MG tablet Take 1 tablet (81 mg total) by mouth daily. Swallow whole. 90 tablet 3   BD PEN NEEDLE NANO U/F 32G X 4 MM MISC USE FOR INSULIN  ADMINISTRATION DAILY 100 each 3   Biotin 10 MG CAPS Take by mouth daily. 5000 mcg bid per pt 30  capsule    carvedilol  (COREG ) 3.125 MG tablet in the morning and at bedtime.     cetirizine (ZYRTEC) 10 MG tablet Take 10 mg by mouth every morning.     Continuous Glucose Sensor (FREESTYLE LIBRE 3 PLUS SENSOR) MISC by Does not apply route. Change sensor every 15 days.     HUMALOG  KWIKPEN 100 UNIT/ML KwikPen Inject 7 Units into the skin 3 (three) times daily. Usually takes 6 units     insulin  glargine (LANTUS  SOLOSTAR) 100 UNIT/ML Solostar Pen Inject 26 Units into the skin every evening.     levothyroxine  (SYNTHROID ) 50 MCG tablet Take 1 tablet (50 mcg total) by mouth daily before breakfast. 90 tablet 3   pravastatin  (PRAVACHOL ) 40 MG tablet Take 1 tablet (40 mg total) by mouth daily. 90 tablet 3   No facility-administered medications prior to visit.    PAST MEDICAL HISTORY: Past  Medical History:  Diagnosis Date   Bladder cancer (HCC)    UROLOGIST- [facility]   Breast cancer (HCC) 2021   right lumpectomy and chemo and radiation done   DJD (degenerative joint disease)    NECK, NEEDS PILLOW WHEN LYING FLAT IS POSSIBLE   History of cerebral aneurysm repair    1996-- S/P CLAPPING RIGHT CAROTID --- NO DEFICITS   History of renal pelvis cancer    10-09-2016  S/P  LEFT RADICAL NEPHROURETERECTOMY -- HIGH GRADE PAPILLARY UROTHELIAL CARCINOMA   Hyperlipidemia    Hypertension    Liver hemangioma    yrs ago per pt on 03-19-2021   OA (osteoarthritis)    Personal history of chemotherapy    Personal history of radiation therapy    PONV (postoperative nausea and vomiting)    Type 2 diabetes mellitus (HCC)    UTI (urinary tract infection)    finished antibiotics 03-01-2021 all symptoms resolved   Wears glasses 03/19/2021    PAST SURGICAL HISTORY: Past Surgical History:  Procedure Laterality Date   ABDOMINAL HYSTERECTOMY  1990   PARTIAL   BREAST LUMPECTOMY WITH RADIOACTIVE SEED AND SENTINEL LYMPH NODE BIOPSY Right 04/17/2020   Procedure: BREAST LUMPECTOMY X 2  WITH RADIOACTIVE SEED AND SENTINEL  LYMPH NODE BIOPSY;  Surgeon: Vernetta Berg, MD;  Location: [facility];  Service: General;  Laterality: Right;   CATARACT EXTRACTION W/ INTRAOCULAR LENS  IMPLANT, BILATERAL  2016; 2017   CEREBRAL ANEURYSM REPAIR  1996   clipped-no deficits RIGHT CAROTID   CHOLECYSTECTOMY  2010   laparoscopic   CYSTOSCOPY W/ RETROGRADES Right 06/09/2017   Procedure: CYSTOSCOPY WITH RETROGRADE PYELOGRAM;  Surgeon: Alvaro Hummer, MD;  Location: [facility];  Service: Urology;  Laterality: Right;   CYSTOSCOPY W/ RETROGRADES Right 07/21/2017   Procedure: CYSTOSCOPY WITH RETROGRADE PYELOGRAM;  Surgeon: Alvaro Hummer, MD;  [facility];  Service: Urology;  Laterality: Right;   CYSTOSCOPY WITH RETROGRADE PYELOGRAM, URETEROSCOPY AND STENT PLACEMENT Left 08/26/2016   Procedure: CYSTOSCOPY WITH BILATERAL RETROGRADE PYELOGRAM,LEFT URETEROSCOPY WITH BIOPSY AND LEFT STENT PLACEMENT;  Surgeon: Hummer Alvaro, MD;  Location: [facility];  Service: Urology;  Laterality: Left;   PORT-A-CATH REMOVAL Left 03/24/2021   Procedure: PORT-A-CATH REMOVAL;  Surgeon: Vernetta Berg, MD;  Location: [facility];  Service: General;  Laterality: Left;   PORTACATH PLACEMENT Left 06/18/2020   Procedure: INSERTION PORT-A-CATH WITH ULTRASOUND GUIDANCE;  Surgeon: Vernetta Berg, MD;  Location: [facility];  Service: General;  Laterality: Left;   ROBOT ASSITED LAPAROSCOPIC NEPHROURETERECTOMY Left 10/09/2016   Procedure: XI ROBOT ASSITED LAPAROSCOPIC NEPHROURETERECTOMY;  Surgeon: Hummer Alvaro, MD;  Location: [facility];  Service: Urology;  Laterality: Left;   SURGERY ON FOOT FOR STAPH INFECTION  1989   TRANSURETHRAL RESECTION OF BLADDER TUMOR N/A 07/21/2017   Procedure: TRANSURETHRAL RESECTION OF BLADDER TUMOR (TURBT);  Surgeon: Alvaro Hummer, MD;  [facility];  Service: Urology;  Laterality: N/A;   TRANSURETHRAL RESECTION OF BLADDER TUMOR WITH MITOMYCIN -C  N/A 06/09/2017   Procedure: TRANSURETHRAL RESECTION OF BLADDER TUMOR WITH MITOMYCIN -C;  Surgeon: Alvaro Hummer, MD;  Location: [facility];  Service: Urology;  Laterality: N/A;   TUBAL LIGATION  1980    FAMILY HISTORY: Family History  Problem Relation Age of Onset   Cancer Mother    Hyperlipidemia Father    Heart attack Father    Diabetes Brother    Breast cancer Neg Hx    Sleep apnea Neg Hx  SOCIAL HISTORY: Social History   Socioeconomic History   Marital status: Married    Spouse name: Not on file   Number of children: Not on file   Years of education: Not on file   Highest education level: Bachelor's degree (e.g., BA, AB, BS)  Occupational History   Not on file  Tobacco Use   Smoking status: Never   Smokeless tobacco: Never  Vaping Use   Vaping status: Never Used  Substance and Sexual Activity   Alcohol use: Not Currently    Comment: , STOPPED   Drug use: No   Sexual activity: Yes  Other Topics Concern   Not on file  Social History Narrative   Not on file   Social Drivers of Health   Financial Resource Strain: Low Risk  (04/26/2024)   Overall Financial Resource Strain (CARDIA)    Difficulty of Paying Living Expenses: Not hard at all  Food Insecurity: No Food Insecurity (04/26/2024)   Hunger Vital Sign    Worried About Running Out of Food in the Last Year: Never true    Ran Out of Food in the Last Year: Never true  Transportation Needs: No Transportation Needs (04/26/2024)   PRAPARE - Administrator, Civil Service (Medical): No    Lack of Transportation (Non-Medical): No  Physical Activity: Insufficiently Active (04/26/2024)   Exercise Vital Sign    Days of Exercise per Week: 3 days    Minutes of Exercise per Session: 20 min  Stress: No Stress Concern Present (04/26/2024)   [employer] of Occupational Health - Occupational Stress Questionnaire    Feeling of Stress: Not at all  Social Connections: Socially Integrated  (04/26/2024)   Social Connection and Isolation Panel    Frequency of Communication with Friends and Family: More than three times a week    Frequency of Social Gatherings with Friends and Family: More than three times a week    Attends Religious Services: More than 4 times per year    Active Member of [employer] or Organizations: Yes    Attends Banker Meetings: More than 4 times per year    Marital Status: Married  Catering manager Violence: Not At Risk (05/07/2021)   Humiliation, Afraid, Rape, and Kick questionnaire    Fear of Current or Ex-Partner: No    Emotionally Abused: No    Physically Abused: No    Sexually Abused: No     PHYSICAL EXAM  Vitals:   06/05/24 0943  BP: (!) 140/83  Pulse: 68  SpO2: 97%  Weight: 241 lb (109.3 kg)  Height: 5' 8 (1.727 m)    Body mass index is 36.64 kg/m.  Generalized: Well developed, in no acute distress  Cardiology: normal rate and rhythm, no murmur noted Respiratory: clear to auscultation bilaterally  Neurological examination  Mentation: Alert oriented to time, place, history taking. Follows all commands speech and language fluent Cranial nerve II-XII: Pupils were equal round reactive to light. Extraocular movements were full, visual field were full  Motor: The motor testing reveals 5 over 5 strength of all 4 extremities. Good symmetric motor tone is noted throughout.  Gait and station: Gait is normal.    DIAGNOSTIC DATA (LABS, IMAGING, TESTING) - I reviewed patient records, labs, notes, testing and imaging myself where available.      No data to display           Lab Results  Component Value Date   WBC 4.6 05/03/2024   HGB 14.5  05/03/2024   HCT 43.6 05/03/2024   MCV 91.4 05/03/2024   PLT 142.0 (L) 05/03/2024      Component Value Date/Time   NA 139 04/20/2024 0000   K 5.1 04/20/2024 0000   CL 102 04/20/2024 0000   CO2 25 (A) 04/20/2024 0000   GLUCOSE 171 (H) 05/04/2022 0923   BUN 29 (A) 04/20/2024 0000    CREATININE 1.6 (A) 04/20/2024 0000   CREATININE 1.80 (H) 05/04/2022 0923   CREATININE 1.78 (H) 06/19/2020 0842   CALCIUM 10.1 04/20/2024 0000   PROT 6.7 05/04/2022 0923   ALBUMIN 4.3 04/20/2024 0000   AST 25 05/04/2022 0923   AST 24 06/19/2020 0842   ALT 28 05/04/2022 0923   ALT 30 06/19/2020 0842   ALKPHOS 3.3 (A) 04/21/2023 0000   BILITOT 0.6 05/04/2022 0923   BILITOT 0.5 06/19/2020 0842   GFRNONAA 28 (L) 06/16/2021 1029   GFRNONAA 28 (L) 06/19/2020 0842   GFRAA 34 (L) 06/26/2020 1535   GFRAA 33 (L) 06/19/2020 0842   Lab Results  Component Value Date   CHOL 146 05/03/2024   HDL 54.30 05/03/2024   LDLCALC 63 05/03/2024   LDLDIRECT 86.0 04/30/2021   TRIG 143.0 05/03/2024   CHOLHDL 3 05/03/2024   Lab Results  Component Value Date   HGBA1C 7.2 04/10/2024   No results found for: VITAMINB12 Lab Results  Component Value Date   TSH 1.36 05/03/2024     ASSESSMENT AND PLAN 73 y.o. year old female  has a past medical history of Bladder cancer (HCC), Breast cancer (HCC) (2021), DJD (degenerative joint disease), History of cerebral aneurysm repair, History of renal pelvis cancer, Hyperlipidemia, Hypertension, Liver hemangioma, OA (osteoarthritis), Personal history of chemotherapy, Personal history of radiation therapy, PONV (postoperative nausea and vomiting), Type 2 diabetes mellitus (HCC), UTI (urinary tract infection), and Wears glasses (03/19/2021). here with     ICD-10-CM   1. OSA on CPAP  G47.33       Tasha Tucker is doing well on CPAP therapy. Compliance report reveals excellent daily but sub optimal four hour compliance. AHI originally 46/h, now 26/h. She denies any significant improvement in sleep quality. ESS now 2/24, previously 10/24. We have discussed ways to help adjust therapy. She is willing to continue working on compliance. I will reach out to [employer] to inquire about possibility of getting her BiPAP. She is willing to switch machines if she does not have to  repeat sleep study. She was encouraged to continue using CPAP nightly and for greater than 4 hours each night. We will update supply orders as indicated. Risks of untreated sleep apnea review and education materials provided. Healthy lifestyle habits encouraged. She will follow up in 1 year if she remains on CPAP, sooner if needed. She verbalizes understanding and agreement with this plan.    No orders of the defined types were placed in this encounter.    No orders of the defined types were placed in this encounter.    I spent 30 minutes of face-to-face and non-face-to-face time with patient.  This included previsit chart review, lab review, study review, order entry, electronic health record documentation, patient education.   Greig Forbes, FNP-C 06/05/2024, 10:38 AM [facility] [address], [address] [place] [address], [place] [address] [phone]

## 2024-06-02 NOTE — Progress Notes (Signed)
 Tasha Tucker

## 2024-06-05 ENCOUNTER — Encounter: Payer: Self-pay | Admitting: Family Medicine

## 2024-06-05 ENCOUNTER — Other Ambulatory Visit: Payer: Self-pay | Admitting: Family Medicine

## 2024-06-05 ENCOUNTER — Ambulatory Visit (INDEPENDENT_AMBULATORY_CARE_PROVIDER_SITE_OTHER): Admitting: Family Medicine

## 2024-06-05 VITALS — BP 140/83 | HR 68 | Ht 68.0 in | Wt 241.0 lb

## 2024-06-05 DIAGNOSIS — G4739 Other sleep apnea: Secondary | ICD-10-CM

## 2024-06-05 DIAGNOSIS — G4733 Obstructive sleep apnea (adult) (pediatric): Secondary | ICD-10-CM

## 2024-06-05 NOTE — Telephone Encounter (Signed)
 Community message sent to [employer] that order placed.

## 2024-06-08 ENCOUNTER — Other Ambulatory Visit (HOSPITAL_COMMUNITY): Payer: Self-pay | Admitting: Urology

## 2024-06-08 ENCOUNTER — Ambulatory Visit (HOSPITAL_COMMUNITY)
Admission: RE | Admit: 2024-06-08 | Discharge: 2024-06-08 | Disposition: A | Discharge status: Home / Self Care | Source: Ambulatory Visit | Attending: Urology | Admitting: Urology

## 2024-06-08 DIAGNOSIS — N1831 Chronic kidney disease, stage 3a: Secondary | ICD-10-CM

## 2024-06-08 DIAGNOSIS — C652 Malignant neoplasm of left renal pelvis: Secondary | ICD-10-CM | POA: Insufficient documentation

## 2024-06-08 DIAGNOSIS — C678 Malignant neoplasm of overlapping sites of bladder: Secondary | ICD-10-CM

## 2024-06-19 ENCOUNTER — Inpatient Hospital Stay: Payer: Medicare Other | Attending: Hematology and Oncology | Admitting: Hematology and Oncology

## 2024-06-19 VITALS — BP 154/57 | HR 72 | Temp 98.1°F | Resp 19 | Wt 234.9 lb

## 2024-06-19 DIAGNOSIS — Z1732 Human epidermal growth factor receptor 2 negative status: Secondary | ICD-10-CM | POA: Insufficient documentation

## 2024-06-19 DIAGNOSIS — Z170 Estrogen receptor positive status [ER+]: Secondary | ICD-10-CM | POA: Insufficient documentation

## 2024-06-19 DIAGNOSIS — C50411 Malignant neoplasm of upper-outer quadrant of right female breast: Secondary | ICD-10-CM | POA: Diagnosis present

## 2024-06-19 DIAGNOSIS — N951 Menopausal and female climacteric states: Secondary | ICD-10-CM | POA: Insufficient documentation

## 2024-06-19 DIAGNOSIS — M85859 Other specified disorders of bone density and structure, unspecified thigh: Secondary | ICD-10-CM | POA: Diagnosis not present

## 2024-06-19 DIAGNOSIS — M8580 Other specified disorders of bone density and structure, unspecified site: Secondary | ICD-10-CM | POA: Diagnosis not present

## 2024-06-19 DIAGNOSIS — Z79811 Long term (current) use of aromatase inhibitors: Secondary | ICD-10-CM | POA: Insufficient documentation

## 2024-06-19 DIAGNOSIS — Z1721 Progesterone receptor positive status: Secondary | ICD-10-CM | POA: Diagnosis not present

## 2024-06-19 DIAGNOSIS — D709 Neutropenia, unspecified: Secondary | ICD-10-CM | POA: Diagnosis not present

## 2024-06-19 NOTE — Progress Notes (Signed)
 [facility]  [phone] [phone]     ID: Tasha Tucker DOB: September 02, 1951  MR#: [id number]  RDW#:[id number]  Patient Care Team: Watt Harlene BROCKS, MD as PCP - General (Family Medicine) Alvan, Ronal FORBES, MD (Inactive) as PCP - Cardiology (Cardiology) Alvaro Ricardo KATHEE Raddle., MD as Consulting Physician (Urology) Tyree Nanetta SAILOR, RN as Oncology Nurse Navigator Vernetta Berg, MD as Consulting Physician (General Surgery) Dewey Rush, MD as Consulting Physician (Radiation Oncology) Josephina Aures, MD as Consulting Physician (Endocrinology) Gearline Norris, MD as Consulting Physician (Nephrology) Loretha Ash, MD as Consulting Physician (Hematology and Oncology) Ash Loretha, MD  CHIEF COMPLAINT: Estrogen receptor positive breast cancer  CURRENT TREATMENT: anastrozole    INTERVAL HISTORY:  Tasha Tucker returns today for follow up of her estrogen receptor positive breast cancer.   Discussed the use of AI scribe software for clinical note transcription with the patient, who gave verbal consent to proceed.  History of Present Illness Tasha Tucker is a 73 year old female who presents for routine follow-up in hematology and medical oncology.  She has been experiencing consistent symptoms since her last visit, with no significant changes in her condition. She continues her medication regimen without issues, except for experiencing a manageable hot flash once in the morning.  She exercises occasionally and had a normal mammogram in August. She has not required any hospital visits since her last appointment  She had a bone density scan in September.  Rest of the pertinent 10 point ROS reviewed and neg.   COVID 19 VACCINATION STATUS: fully vaccinated [employer]), with booster 04/2020; infection 02/2021   HISTORY OF CURRENT ILLNESS: From the original intake note:  Tasha Tucker had routine screening mammography on 03/13/2020 showing a possible  abnormality in the right breast. She underwent right diagnostic mammography with tomography and right breast ultrasonography at [facility] on 03/26/2020 showing: breast density category B; suspicious 1.9 cm mass/distortion in right breast at 9 o'clock; no right axillary lymphadenopathy.  Accordingly that same day, she proceeded to biopsy of the right breast area in question. The pathology from this procedure ([id number]) showed: invasive and in situ mammary carcinoma, grade 2, e-cadherin negative. Prognostic indicators significant for: estrogen receptor, 90% positive and progesterone receptor, 90% positive, both with strong staining intensity. Proliferation marker Ki67 at 5%. HER2 negative by immunohistochemistry (1+).  The patient's subsequent history is as detailed below.   PAST MEDICAL HISTORY: Past Medical History:  Diagnosis Date   Bladder cancer (HCC)    UROLOGIST- [facility]   Breast cancer (HCC) 2021   right lumpectomy and chemo and radiation done   DJD (degenerative joint disease)    NECK, NEEDS PILLOW WHEN LYING FLAT IS POSSIBLE   History of cerebral aneurysm repair    1996-- S/P CLAPPING RIGHT CAROTID --- NO DEFICITS   History of renal pelvis cancer    10-09-2016  S/P  LEFT RADICAL NEPHROURETERECTOMY -- HIGH GRADE PAPILLARY UROTHELIAL CARCINOMA   Hyperlipidemia    Hypertension    Liver hemangioma    yrs ago per pt on 03-19-2021   OA (osteoarthritis)    Personal history of chemotherapy    Personal history of radiation therapy    PONV (postoperative nausea and vomiting)    Type 2 diabetes mellitus (HCC)    UTI (urinary tract infection)    finished antibiotics 03-01-2021 all symptoms resolved   Wears glasses 03/19/2021    PAST SURGICAL HISTORY: Past Surgical History:  Procedure Laterality Date   ABDOMINAL HYSTERECTOMY  1990   PARTIAL   BREAST LUMPECTOMY WITH RADIOACTIVE SEED AND SENTINEL LYMPH NODE BIOPSY Right 04/17/2020   Procedure: BREAST LUMPECTOMY X 2  WITH  RADIOACTIVE SEED AND SENTINEL LYMPH NODE BIOPSY;  Surgeon: Vernetta Berg, MD;  Location: [facility];  Service: General;  Laterality: Right;   CATARACT EXTRACTION W/ INTRAOCULAR LENS  IMPLANT, BILATERAL  2016; 2017   CEREBRAL ANEURYSM REPAIR  1996   clipped-no deficits RIGHT CAROTID   CHOLECYSTECTOMY  2010   laparoscopic   CYSTOSCOPY W/ RETROGRADES Right 06/09/2017   Procedure: CYSTOSCOPY WITH RETROGRADE PYELOGRAM;  Surgeon: Alvaro Hummer, MD;  Location: [facility];  Service: Urology;  Laterality: Right;   CYSTOSCOPY W/ RETROGRADES Right 07/21/2017   Procedure: CYSTOSCOPY WITH RETROGRADE PYELOGRAM;  Surgeon: Alvaro Hummer, MD;  Location: [facility];  Service: Urology;  Laterality: Right;   CYSTOSCOPY WITH RETROGRADE PYELOGRAM, URETEROSCOPY AND STENT PLACEMENT Left 08/26/2016   Procedure: CYSTOSCOPY WITH BILATERAL RETROGRADE PYELOGRAM,LEFT URETEROSCOPY WITH BIOPSY AND LEFT STENT PLACEMENT;  Surgeon: Hummer Alvaro, MD;  Location: [facility];  Service: Urology;  Laterality: Left;   PORT-A-CATH REMOVAL Left 03/24/2021   Procedure: PORT-A-CATH REMOVAL;  Surgeon: Vernetta Berg, MD;  Location: [facility];  Service: General;  Laterality: Left;   PORTACATH PLACEMENT Left 06/18/2020   Procedure: INSERTION PORT-A-CATH WITH ULTRASOUND GUIDANCE;  Surgeon: Vernetta Berg, MD;  Location: [facility];  Service: General;  Laterality: Left;   ROBOT ASSITED LAPAROSCOPIC NEPHROURETERECTOMY Left 10/09/2016   Procedure: XI ROBOT ASSITED LAPAROSCOPIC NEPHROURETERECTOMY;  Surgeon: Hummer Alvaro, MD;  Location: [facility];  Service: Urology;  Laterality: Left;   SURGERY ON FOOT FOR STAPH INFECTION  1989   TRANSURETHRAL RESECTION OF BLADDER TUMOR N/A 07/21/2017   Procedure: TRANSURETHRAL RESECTION OF BLADDER TUMOR (TURBT);  Surgeon: Alvaro Hummer, MD;  Location: [facility];  Service: Urology;  Laterality: N/A;   TRANSURETHRAL RESECTION OF  BLADDER TUMOR WITH MITOMYCIN -C N/A 06/09/2017   Procedure: TRANSURETHRAL RESECTION OF BLADDER TUMOR WITH MITOMYCIN -C;  Surgeon: Alvaro Hummer, MD;  Location[facility];  Service: Urology;  Laterality: N/A;   TUBAL LIGATION  1980    FAMILY HISTORY: Family History  Problem Relation Age of Onset   Cancer Mother    Hyperlipidemia Father    Heart attack Father    Diabetes Brother    Breast cancer Neg Hx    Sleep apnea Neg Hx    Her father died at age 69 from ASCVD. Her mother died at age 6. She reports her mother had a tumor in her thigh at age 71, possibly a sarcoma. The patient has one brother, with no history of cancer. Her son, Bernardino, had brain cancer (oligodedroglioma, possibly) and is doing well now at age 13 (as of 03/2020).   GYNECOLOGIC HISTORY:  No LMP recorded. Patient has had a hysterectomy. Menarche: 73 years old Age at first live birth: 73 years old GX P 3 LMP 1990 Contraceptive: used pills for 8 years without issues HRT never used  Hysterectomy? Yes, 1990 BSO? 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 Husband Dick is a retired Technical sales engineer for the [employer]. She lives at home with husband Dick and their cat, Penelope. Daughter Donald, age 81, works as a Child psychotherapist in [place]. Son Bernardino, age 66, works in Immunologist in [place]. Son Thresa, age 41, lives in [place] and was previously a paramedic. Karleigh has 4 grandchildren.  She attends a [employer].     ADVANCED DIRECTIVES: In the absence of any documentation to the contrary, the patient's spouse is their HCPOA.    HEALTH MAINTENANCE: Social History   Tobacco Use   Smoking status: Never   Smokeless tobacco: Never  Vaping Use   Vaping status: Never Used  Substance Use Topics   Alcohol use: Not Currently    Comment: , STOPPED   Drug use: No     Colonoscopy: Cologuard 2020 was negative  PAP:  date unknown, s/p hysterectomy  Bone density: 05/2021, +1.7   Allergies  Allergen Reactions   Other Other (See Comments)   Adhesive [Tape] Rash    PAPER TAPE ONLY to use   Chlorhexidine  Gluconate Itching and Rash    Topical antiseptic (CHG WIPES)    Dilantin [Phenytoin] Rash   Penicillins Rash    Has patient had a PCN reaction causing immediate rash, facial/tongue/throat swelling, SOB or lightheadedness with hypotension:unsure Has patient had a PCN reaction causing severe rash involving mucus membranes or skin necrosis:unsure Has patient had a PCN reaction that required hospitalization:No Has patient had a PCN reaction occurring within the last 10 years:No If all of the above answers are NO, then may proceed with Cephalosporin use.    Sulfa Antibiotics Rash    Current Outpatient Medications  Medication Sig Dispense Refill   acetaminophen  (TYLENOL ) 500 MG tablet Take 500 mg by mouth at bedtime.      anastrozole  (ARIMIDEX ) 1 MG tablet TAKE 1 TABLET BY MOUTH EVERY DAY 90 tablet 4   aspirin  EC 81 MG tablet Take 1 tablet (81 mg total) by mouth daily. Swallow whole. 90 tablet 3   BD PEN NEEDLE NANO U/F 32G X 4 MM MISC USE FOR INSULIN  ADMINISTRATION DAILY 100 each 3   Biotin 10 MG CAPS Take by mouth daily. 5000 mcg bid per pt 30 capsule    carvedilol  (COREG ) 3.125 MG tablet in the morning and at bedtime.     cetirizine (ZYRTEC) 10 MG tablet Take 10 mg by mouth every morning.     Continuous Glucose Sensor (FREESTYLE LIBRE 3 PLUS SENSOR) MISC by Does not apply route. Change sensor every 15 days.     HUMALOG  KWIKPEN 100 UNIT/ML KwikPen Inject 7 Units into the skin 3 (three) times daily. Usually takes 6 units     insulin  glargine (LANTUS  SOLOSTAR) 100 UNIT/ML Solostar Pen Inject 26 Units into the skin every evening.     levothyroxine  (SYNTHROID ) 50 MCG tablet Take 1 tablet (50 mcg total) by mouth daily before breakfast. 90 tablet 3   pravastatin  (PRAVACHOL ) 40 MG tablet Take 1 tablet (40 mg  total) by mouth daily. 90 tablet 3   No current facility-administered medications for this visit.    OBJECTIVE: White woman in no acute distress  Vitals:   06/19/24 0919  BP: (!) 154/57  Pulse: 72  Resp: 19  Temp: 98.1 F (36.7 C)  SpO2: 99%      Body mass index is 35.72 kg/m.   Wt Readings from Last 3 Encounters:  06/19/24 234 lb 14.4 oz (106.5 kg)  06/05/24 241 lb (109.3 kg)  05/19/24 236 lb 6.4 oz (107.2 kg)      ECOG FS:1 - Symptomatic but completely ambulatory  Physical Exam Constitutional:      Appearance: Normal appearance.  Chest:     Comments: Bilateral breasts inspected.  No palpable masses or regional adenopathy.  Surgical distortion noted in the right breast Musculoskeletal:  Cervical back: Normal range of motion and neck supple. No rigidity.  Lymphadenopathy:     Cervical: No cervical adenopathy.  Neurological:     Mental Status: She is alert.     LAB RESULTS:  CMP     Component Value Date/Time   NA 139 04/20/2024 0000   K 5.1 04/20/2024 0000   CL 102 04/20/2024 0000   CO2 25 (A) 04/20/2024 0000   GLUCOSE 171 (H) 05/04/2022 0923   BUN 29 (A) 04/20/2024 0000   CREATININE 1.6 (A) 04/20/2024 0000   CREATININE 1.80 (H) 05/04/2022 0923   CREATININE 1.78 (H) 06/19/2020 0842   CALCIUM 10.1 04/20/2024 0000   PROT 6.7 05/04/2022 0923   ALBUMIN 4.3 04/20/2024 0000   AST 25 05/04/2022 0923   AST 24 06/19/2020 0842   ALT 28 05/04/2022 0923   ALT 30 06/19/2020 0842   ALKPHOS 3.3 (A) 04/21/2023 0000   BILITOT 0.6 05/04/2022 0923   BILITOT 0.5 06/19/2020 0842   GFRNONAA 28 (L) 06/16/2021 1029   GFRNONAA 28 (L) 06/19/2020 0842   GFRAA 34 (L) 06/26/2020 1535   GFRAA 33 (L) 06/19/2020 0842    No results found for: TOTALPROTELP, ALBUMINELP, A1GS, A2GS, BETS, BETA2SER, GAMS, MSPIKE, SPEI  Lab Results  Component Value Date   WBC 4.6 05/03/2024   NEUTROABS 3.9 06/16/2021   HGB 14.5 05/03/2024   HCT 43.6 05/03/2024   MCV 91.4  05/03/2024   PLT 142.0 (L) 05/03/2024    No results found for: LABCA2  No components found for: OJARJW874  No results for input(s): INR in the last 168 hours.  No results found for: LABCA2  No results found for: RJW800  No results found for: CAN125  No results found for: CAN153  No results found for: CA2729  No components found for: HGQUANT  No results found for: CEA1, CEA / No results found for: CEA1, CEA   No results found for: AFPTUMOR  No results found for: CHROMOGRNA  No results found for: KPAFRELGTCHN, LAMBDASER, KAPLAMBRATIO (kappa/lambda light chains)  No results found for: HGBA, HGBA2QUANT, HGBFQUANT, HGBSQUAN (Hemoglobinopathy evaluation)   No results found for: LDH  Lab Results  Component Value Date   IRON 103 04/21/2023   TIBC 311 04/21/2023   IRONPCTSAT 33 04/21/2023   (Iron and TIBC)  Lab Results  Component Value Date   FERRITIN 246 10/21/2023    Urinalysis    Component Value Date/Time   COLORURINE YELLOW 08/06/2020 1414   APPEARANCEUR HAZY (A) 08/06/2020 1414   LABSPEC 1.009 08/06/2020 1414   PHURINE 6.0 08/06/2020 1414   GLUCOSEU NEGATIVE 08/06/2020 1414   HGBUR SMALL (A) 08/06/2020 1414   BILIRUBINUR neg 05/19/2024 0920   KETONESUR negative 06/13/2021 0959   KETONESUR NEGATIVE 08/06/2020 1414   PROTEINUR Negative 05/19/2024 0920   PROTEINUR NEGATIVE 08/06/2020 1414   UROBILINOGEN 0.2 05/19/2024 0920   UROBILINOGEN 1.0 04/29/2013 1455   NITRITE neg 05/19/2024 0920   NITRITE NEGATIVE 08/06/2020 1414   LEUKOCYTESUR Large (3+) (A) 05/19/2024 0920   LEUKOCYTESUR LARGE (A) 08/06/2020 1414    STUDIES: DG Chest 2 View Result Date: 06/13/2024 EXAM: 2 VIEW(S) XRAY OF THE CHEST 06/08/2024 09:53:00 AM COMPARISON: 05/20/2023 CLINICAL HISTORY: Malignant neoplasm of left renal pelvis. Malignant neoplasm of left renal pelvis, cancer of overlapping sites of bladder [facility]), malignant neoplasm of left  renal pelvis (HCC), chronic kidney disease, stage 3a (HCC). Pt states hx of arterial sclerosis. FINDINGS: LUNGS AND PLEURA: No focal pulmonary opacity. No pulmonary edema. No  pleural effusion. No pneumothorax. HEART AND MEDIASTINUM: No acute abnormality of the cardiac and mediastinal silhouettes. Aortic atherosclerosis. BONES AND SOFT TISSUES: No acute osseous abnormality. Right breast postoperative changes. IMPRESSION: 1. No acute findings. Electronically signed by: Katheleen Faes MD 06/13/2024 01:27 PM EDT RP Workstation: [id number]     ELIGIBLE FOR AVAILABLE RESEARCH PROTOCOL: AET  ASSESSMENT: 73 y.o. [place]  woman status post right breast upper outer quadrant biopsy 03/26/2020, for a clinical T1c N0, stage IA invasive lobular carcinoma, E-cadherin negative, grade 2, estrogen and progesterone receptor positive, with an MIB-1 of 5% and no HER-2 amplification  (a) unable to obtain breast MRI due to renal concern with contrast status post nephrectomy  (1) status post right lumpectomy and sentinel lymph node sampling 04/17/2020 for a pT2 pN0, stage IB invasive lobular carcinoma, grade 2, with negative margins  (a) a single right axillary lymph node was removed.  (2) Oncotype score of 29 predicts a risk of recurrence outside the breast within the next 9 years of 18% with antiestrogens only, and also a greater than 15% benefit from chemotherapy.  (3) chemotherapy consisting of cyclophosphamide , methotrexate  and fluorouracil  given every 21 days x 8, started 06/19/2020, completed 11/18/2020  (a) Udenyca  added a day 3 beginning with cycle 2 secondary to treatment delays due to neutropenia  (4) adjuvant radiation 12/11/2020 through 01/08/2021 Site Technique Total Dose (Gy) Dose per Fx (Gy) Completed Fx Beam Energies  Breast, Right: Breast_Rt 3D 42.56/42.56 2.66 16/16 6X, 10X  Breast, Right: Breast_Rt_Bst specialPort 8/8 2 4/4 15E   (5) anastrozole  started 04/03/2020 with anticipation of  surgical delays  (a) bone density 03/30/2018 shows a T score of +1.5 (normal)  (B) bone density 05/29/2021 shows a T-score of +1.7   PLAN:  Assessment and Plan Assessment & Plan Breast cancer Managed with adjuvant endocrine therapy since 2021, well-tolerated. - Continue endocrine therapy until 2026. - No concerns for ROS and PE. - she wanted diagnostic mammogram for 1 more yr.  Menopausal symptoms Occasional hot flashes, manageable. No indication for treatment.  Osteopenia Last bone density showed osteopenia. Encouraged weight bearing exercises, calcium and vit D supplementation.  Time spent: 20 min  *Total Encounter Time as defined by the Centers for Medicare and Medicaid Services includes, in addition to the face-to-face time of a patient visit (documented in the note above) non-face-to-face time: obtaining and reviewing outside history, ordering and reviewing medications, tests or procedures, care coordination (communications with other health care professionals or caregivers) and documentation in the medical record.

## 2024-06-22 ENCOUNTER — Telehealth: Payer: Self-pay

## 2024-06-22 NOTE — Telephone Encounter (Signed)
 Please have patient schedule an appointment between 07/21/24 - 09/18/24 for supply approval

## 2024-07-03 ENCOUNTER — Ambulatory Visit: Attending: Internal Medicine | Admitting: Internal Medicine

## 2024-07-03 VITALS — BP 128/68 | HR 60 | Ht 68.0 in | Wt 237.0 lb

## 2024-07-03 DIAGNOSIS — R931 Abnormal findings on diagnostic imaging of heart and coronary circulation: Secondary | ICD-10-CM

## 2024-07-03 DIAGNOSIS — I10 Essential (primary) hypertension: Secondary | ICD-10-CM

## 2024-07-03 DIAGNOSIS — I2510 Atherosclerotic heart disease of native coronary artery without angina pectoris: Secondary | ICD-10-CM | POA: Diagnosis not present

## 2024-07-03 DIAGNOSIS — E782 Mixed hyperlipidemia: Secondary | ICD-10-CM | POA: Diagnosis not present

## 2024-07-03 NOTE — Patient Instructions (Signed)
 Medication Instructions:  The current medical regimen is effective;  continue present plan and medications.  *If you need a refill on your cardiac medications before your next appointment, please call your pharmacy*  Follow-Up: At [facility], you and your health needs are our priority.  As part of our continuing mission to provide you with exceptional heart care, our providers are all part of one team.  This team includes your primary Cardiologist (physician) and Advanced Practice Providers or APPs (Physician Assistants and Nurse Practitioners) who all work together to provide you with the care you need, when you need it.  Your next appointment:   1 year(s)  Provider:   Dr Loni     We recommend signing up for the patient portal called MyChart.  Sign up information is provided on this After Visit Summary.  MyChart is used to connect with patients for Virtual Visits (Telemedicine).  Patients are able to view lab/test results, encounter notes, upcoming appointments, etc.  Non-urgent messages can be sent to your provider as well.   To learn more about what you can do with MyChart, go to [email or web address].

## 2024-07-03 NOTE — Progress Notes (Signed)
 Cardiology Office Note:  .   Date:  07/03/2024  ID:  WINNONA Tucker, DOB [birth year], MRN [id number] PCP: Watt Harlene BROCKS, MD  [facility] Providers Cardiologist:  Soyla DELENA Merck, MD    History of Present Illness: .   Tasha Tucker is a 73 y.o. female.  Discussed the use of AI scribe software for clinical note transcription with the patient, who gave verbal consent to proceed.  History of Present Illness Tasha Tucker is a 73 year old female with coronary artery disease who presents for cardiovascular follow-up.  She has no current symptoms of chest pain, shortness of breath, or palpitations. She has an elevated coronary artery calcium score of 586. Normal echocardiogram last year. She is on pravastatin  for cholesterol management and takes a baby aspirin  daily.  She has sleep apnea, using a BiPAP machine, with initial 43 apneic episodes per hour improved to 26. She is concerned about the impact on heart health. 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   S/p cerebral aneurysm clipped in 1996.  Her past medical history includes bladder cancer, breast cancer treated with radiation therapy, currently on anastrozole , stage four chronic kidney disease, hypothyroidism, hypertension, and type 2 diabetes mellitus. Her father had arteriosclerosis.  She is currently taking carvedilol  3.125 mg twice a day, pravastatin  40 mg daily, insulin , and anastrozole . She also takes allergy medication and a baby aspirin  daily. She experiences occasional swelling in the left ankle, particularly after flying, and uses compression socks during flights.    ROS: negative except per HPI above.  Studies Reviewed: SABRA   EKG Interpretation Date/Time:  Monday July 03 2024 08:50:23 EDT Ventricular Rate:  60 PR Interval:  192 QRS Duration:  78 QT Interval:  408 QTC Calculation: 408 R Axis:   4  Text Interpretation: Normal sinus rhythm Normal ECG When compared with ECG of  07-Jul-2023 09:58, Premature atrial complexes are no longer Present Confirmed by Merck Soyla ([id number]) on 07/03/2024 9:10:36 AM    Results LABS HbA1c: 7.2 (04/2024) Platelets (PLT): Low (04/2024)  RADIOLOGY Coronary artery calcium score: 586  DIAGNOSTIC Echocardiogram: Grossly normal (2024) Electrocardiogram (EKG): Normal Sleep study: 43 apneic episodes per hour Risk Assessment/Calculations:       Physical Exam:   VS:  BP 128/68 (BP Location: Right Arm, Patient Position: Sitting, Cuff Size: Large)   Pulse 60   Ht 5' 8 (1.727 m)   Wt 237 lb (107.5 kg)   SpO2 99%   BMI 36.04 kg/m    Wt Readings from Last 3 Encounters:  07/03/24 237 lb (107.5 kg)  06/19/24 234 lb 14.4 oz (106.5 kg)  06/05/24 241 lb (109.3 kg)     Physical Exam GENERAL: Alert, cooperative, well developed, no acute distress. HEENT: Normocephalic, normal oropharynx, moist mucous membranes. CHEST: Clear to auscultation bilaterally, no wheezes, rhonchi, or crackles. CARDIOVASCULAR: Normal heart rate and rhythm, S1 and S2 normal without murmurs. EKG normal. ABDOMEN: Soft, non-tender, non-distended, without organomegaly, normal bowel sounds. EXTREMITIES: No cyanosis or edema. NEUROLOGICAL: Cranial nerves grossly intact, moves all extremities without gross motor or sensory deficit.   ASSESSMENT AND PLAN: .    Assessment and Plan Assessment & Plan Coronary artery calcification and atherosclerotic heart disease without angina Coronary artery calcification due to plaque formation. No angina or related symptoms. Normal EKG and echocardiogram. Emphasized cholesterol and blood pressure management. - Continue pravastatin  40 mg daily. - Continue baby aspirin  81 mg daily. - Monitor for symptoms such as chest pain or  shortness of breath and report immediately. - Maintain blood pressure control. - Follow up in one year unless symptoms develop.  Hypertension - Continue carvedilol  3.125 mg twice a day - Monitor  blood pressure regularly.  Hyperlipidemia on statin therapy Hyperlipidemia well-managed with pravastatin . Cholesterol levels at goal. - Continue pravastatin  40 mg daily. Would consider intensifying therapy given CAC. - Monitor cholesterol levels regularly.  Type 2 diabetes mellitus without complications Type 2 diabetes managed with insulin . A1c slightly elevated at 7.2. - On insulin  therapy per PCP  Sleep apnea Sleep apnea managed with BiPAP. Apneas reduced from 43 to 26 per hour. Normal echocardiogram. - Consult with sleep specialist for potential BiPAP setting adjustments. - Continue using BiPAP machine. - Monitor for changes in symptoms or treatment effectiveness.        Soyla Merck, MD, FACC

## 2024-07-11 ENCOUNTER — Ambulatory Visit

## 2024-07-31 NOTE — Patient Instructions (Incomplete)

## 2024-07-31 NOTE — Progress Notes (Unsigned)
 PATIENT: Tasha Tucker DOB: 15-May-1951  REASON FOR VISIT: follow up HISTORY FROM: patient  No chief complaint on file.    HISTORY OF PRESENT ILLNESS:  07/31/24 ALL:  Tasha Tucker returns for follow up for OSA recently switched to BiPAP therapy. She was last seen 05/2024 and AHI remained elevated. We placed orders for BiPAP therapy. Since,   06/05/2024 ALL:  Tasha Tucker returns for follow up for OSA on CPAP. She was last seen by me 01/2024 and was having difficulty adjusting to CPAP therapy. Since, she reports doing a little better. She is using CPAP most every night but admits that after 3-4 hours, she has excessively dry mouth and unable to continue therapy. She is using a nasal mask with memory foam base. She reports headgear has plastic supports that are uncomfortable. She was hesitant to consider BiPAP therapy following CPAP titration due to concerns of not being able to meet compliance requirements. She is willing to reconsider if she does not have to repeat sleep study. She has not noted any significant benefit in sleep quality on therapy. ESS 2.     02/22/2024 ALL:  Tasha Tucker is a 73 y.o. female here today for follow up for OSA on CPAP. She was last seen by Dr Buck 09/2023. Titration study 09/2023 was limited due to severe sleep fragmentation/poor sleep consolidation. Sleep efficiency 27.9%. Little supine sleep and no REM sleep. Auto BiPAP offered but declined by patient. She has continued autoPAP.   Since, she reports continued difficulty adjusting to therapy. She tries to use CPAP most every night but feels she gets frustrated after not being able to go to sleep and removes mask. She can, sometimes, fall asleep for a little while but wakes up and takes mask off. She has been worried about meeting compliance. She has not noted any specific benefit of using therapy. She does not want to switch to BiPAP due to being nervous that she will not be able to continue therapy and doesn't want to have to  pay for machine. She is willing to continue working on compliance. She is most comfortable with nasal mask with memory foam. Her husband uses CPAP without difficulty.     HISTORY: (copied from Dr Obie previous note)  Tasha Tucker is a 73 year old female with an underlying medical history of breast cancer with status post right lumpectomy and chemo-radiation, degenerative joint disease, cerebral aneurysm repair, hypertension, hyperlipidemia, osteoarthritis, liver hemangioma, type 2 diabetes, bladder cancer with status post left radical nephroureterectomy in 2019, and obesity, who presents for follow-up consultation of her obstructive sleep apnea after interim testing and starting home AutoPap therapy.  The patient is unaccompanied today.  I first met her at the request of her primary care physician on 03/22/2023, at which time she reported snoring and witnessed apneas per husband's observation.  She was advised to proceed with a sleep study.  She had a baseline polysomnogram through our sleep lab on 07/12/2023 which showed severe obstructive sleep apnea with an AHI of 43.6/h, O2 nadir 81%.  She had moderate PLM's without significant arousals.  She was advised to proceed with home AutoPap therapy.  Set up date was 07/21/2023.  She has a ResMed air sense 11 AutoSet machine.  Her DME provider is [facility].  Today, 10/13/2023: I reviewed her AutoPap compliance data from 07/21/2023 through 08/19/2023, which is a total of 30 days, during which time she used her machine 29 days with percent use days greater than 4 hours  at 73%, indicating adequate compliance with an average usage of 5 hours and 2 minutes for days of treatment, residual AHI elevated at 26.8/h with mild increase in central events noted.  95th percentile pressure at 10.8 cm with a range of 6 to 11 cm with EPR of 3.  Leak low with the 95th percentile at 2.2 L/min.  She reports difficulty with tolerance of her AutoPap.  She started with a fullface  mask which was tolerable to use but she woke up with severe dry mouth and dry lips.  She is now on a nasal mask, small sized Eson 2 from [employer], this mask cuts into her nose and is uncomfortable.  She was given a chinstrap as well.  She feels it difficult to continue with AutoPap therapy but is willing to continue to try.  She is humidifier set at them but then water  accumulated in the hose.  She was compliant in the first month but current month shows much less usage, average usage of 2 hours and 31 minutes, percent use days greater than 4 hours of 13%.    REVIEW OF SYSTEMS: Out of a complete 14 system review of symptoms, the patient complains only of the following symptoms, nasal congestion and all other reviewed systems are negative.  ESS: 2/24  ALLERGIES: Allergies  Allergen Reactions   Other Other (See Comments)   Adhesive [Tape] Rash    PAPER TAPE ONLY to use   Chlorhexidine  Gluconate Itching and Rash    Topical antiseptic (CHG WIPES)    Dilantin [Phenytoin] Rash   Penicillins Rash    Has patient had a PCN reaction causing immediate rash, facial/tongue/throat swelling, SOB or lightheadedness with hypotension:unsure Has patient had a PCN reaction causing severe rash involving mucus membranes or skin necrosis:unsure Has patient had a PCN reaction that required hospitalization:No Has patient had a PCN reaction occurring within the last 10 years:No If all of the above answers are NO, then may proceed with Cephalosporin use.    Sulfa Antibiotics Rash    HOME MEDICATIONS: Outpatient Medications Prior to Visit  Medication Sig Dispense Refill   acetaminophen  (TYLENOL ) 500 MG tablet Take 500 mg by mouth at bedtime.      anastrozole  (ARIMIDEX ) 1 MG tablet TAKE 1 TABLET BY MOUTH EVERY DAY 90 tablet 4   aspirin  EC 81 MG tablet Take 1 tablet (81 mg total) by mouth daily. Swallow whole. 90 tablet 3   BD PEN NEEDLE NANO U/F 32G X 4 MM MISC USE FOR INSULIN  ADMINISTRATION DAILY 100 each  3   Biotin 10 MG CAPS Take by mouth daily. 5000 mcg bid per pt 30 capsule    carvedilol  (COREG ) 3.125 MG tablet in the morning and at bedtime.     cetirizine (ZYRTEC) 10 MG tablet Take 10 mg by mouth every morning.     Continuous Glucose Sensor (FREESTYLE LIBRE 3 PLUS SENSOR) MISC by Does not apply route. Change sensor every 15 days.     HUMALOG  KWIKPEN 100 UNIT/ML KwikPen Inject 7 Units into the skin 3 (three) times daily. Usually takes 6 units     insulin  glargine (LANTUS  SOLOSTAR) 100 UNIT/ML Solostar Pen Inject 26 Units into the skin every evening.     levothyroxine  (SYNTHROID ) 50 MCG tablet Take 1 tablet (50 mcg total) by mouth daily before breakfast. 90 tablet 3   pravastatin  (PRAVACHOL ) 40 MG tablet Take 1 tablet (40 mg total) by mouth daily. 90 tablet 3   No facility-administered medications prior to visit.  PAST MEDICAL HISTORY: Past Medical History:  Diagnosis Date   Bladder cancer (HCC)    UROLOGIST- [facility]   Breast cancer (HCC) 2021   right lumpectomy and chemo and radiation done   DJD (degenerative joint disease)    NECK, NEEDS PILLOW WHEN LYING FLAT IS POSSIBLE   History of cerebral aneurysm repair    1996-- S/P CLAPPING RIGHT CAROTID --- NO DEFICITS   History of renal pelvis cancer    10-09-2016  S/P  LEFT RADICAL NEPHROURETERECTOMY -- HIGH GRADE PAPILLARY UROTHELIAL CARCINOMA   Hyperlipidemia    Hypertension    Liver hemangioma    yrs ago per pt on 03-19-2021   OA (osteoarthritis)    Personal history of chemotherapy    Personal history of radiation therapy    PONV (postoperative nausea and vomiting)    Type 2 diabetes mellitus (HCC)    UTI (urinary tract infection)    finished antibiotics 03-01-2021 all symptoms resolved   Wears glasses 03/19/2021    PAST SURGICAL HISTORY: Past Surgical History:  Procedure Laterality Date   ABDOMINAL HYSTERECTOMY  1990   PARTIAL   BREAST LUMPECTOMY WITH RADIOACTIVE SEED AND SENTINEL LYMPH NODE BIOPSY Right 04/17/2020    Procedure: BREAST LUMPECTOMY X 2  WITH RADIOACTIVE SEED AND SENTINEL LYMPH NODE BIOPSY;  Surgeon: Vernetta Berg, MD;  Location: [facility];  Service: General;  Laterality: Right;   CATARACT EXTRACTION W/ INTRAOCULAR LENS  IMPLANT, BILATERAL  2016; 2017   CEREBRAL ANEURYSM REPAIR  1996   clipped-no deficits RIGHT CAROTID   CHOLECYSTECTOMY  2010   laparoscopic   CYSTOSCOPY W/ RETROGRADES Right 06/09/2017   Procedure: CYSTOSCOPY WITH RETROGRADE PYELOGRAM;  Surgeon: Alvaro Hummer, MD;  Location[facility];  Service: Urology;  Laterality: Right;   CYSTOSCOPY W/ RETROGRADES Right 07/21/2017   Procedure: CYSTOSCOPY WITH RETROGRADE PYELOGRAM;  Surgeon: Alvaro Hummer, MD;  Location: [facility];  Service: Urology;  Laterality: Right;   CYSTOSCOPY WITH RETROGRADE PYELOGRAM, URETEROSCOPY AND STENT PLACEMENT Left 08/26/2016   Procedure: CYSTOSCOPY WITH BILATERAL RETROGRADE PYELOGRAM,LEFT URETEROSCOPY WITH BIOPSY AND LEFT STENT PLACEMENT;  Surgeon: Hummer Alvaro, MD;  Location: [facility];  Service: Urology;  Laterality: Left;   PORT-A-CATH REMOVAL Left 03/24/2021   Procedure: PORT-A-CATH REMOVAL;  Surgeon: Vernetta Berg, MD;  Location: [facility];  Service: General;  Laterality: Left;   PORTACATH PLACEMENT Left 06/18/2020   Procedure: INSERTION PORT-A-CATH WITH ULTRASOUND GUIDANCE;  Surgeon: Vernetta Berg, MD;  Location: [facility];  Service: General;  Laterality: Left;   ROBOT ASSITED LAPAROSCOPIC NEPHROURETERECTOMY Left 10/09/2016   Procedure: XI ROBOT ASSITED LAPAROSCOPIC NEPHROURETERECTOMY;  Surgeon: Hummer Alvaro, MD;  Location: [facility];  Service: Urology;  Laterality: Left;   SURGERY ON FOOT FOR STAPH INFECTION  1989   TRANSURETHRAL RESECTION OF BLADDER TUMOR N/A 07/21/2017   Procedure: TRANSURETHRAL RESECTION OF BLADDER TUMOR (TURBT);  Surgeon: Alvaro Hummer, MD;  Location[facility];  Service: Urology;   Laterality: N/A;   TRANSURETHRAL RESECTION OF BLADDER TUMOR WITH MITOMYCIN -C N/A 06/09/2017   Procedure: TRANSURETHRAL RESECTION OF BLADDER TUMOR WITH MITOMYCIN -C;  Surgeon: Alvaro Hummer, MD;  Location: [facility];  Service: Urology;  Laterality: N/A;   TUBAL LIGATION  1980    FAMILY HISTORY: Family History  Problem Relation Age of Onset   Cancer Mother    Hyperlipidemia Father    Heart attack Father    Diabetes Brother    Breast cancer Neg Hx    Sleep  apnea Neg Hx     SOCIAL HISTORY: Social History   Socioeconomic History   Marital status: Married    Spouse name: Not on file   Number of children: Not on file   Years of education: Not on file   Highest education level: Bachelor's degree (e.g., BA, AB, BS)  Occupational History   Not on file  Tobacco Use   Smoking status: Never   Smokeless tobacco: Never  Vaping Use   Vaping status: Never Used  Substance and Sexual Activity   Alcohol use: Not Currently    Comment: , STOPPED   Drug use: No   Sexual activity: Yes  Other Topics Concern   Not on file  Social History Narrative   Not on file   Social Drivers of Health   Financial Resource Strain: Low Risk  (04/26/2024)   Overall Financial Resource Strain (CARDIA)    Difficulty of Paying Living Expenses: Not hard at all  Food Insecurity: No Food Insecurity (04/26/2024)   Hunger Vital Sign    Worried About Running Out of Food in the Last Year: Never true    Ran Out of Food in the Last Year: Never true  Transportation Needs: No Transportation Needs (04/26/2024)   PRAPARE - Administrator, Civil Service (Medical): No    Lack of Transportation (Non-Medical): No  Physical Activity: Insufficiently Active (04/26/2024)   Exercise Vital Sign    Days of Exercise per Week: 3 days    Minutes of Exercise per Session: 20 min  Stress: No Stress Concern Present (04/26/2024)   [employer] of Occupational Health - Occupational Stress Questionnaire     Feeling of Stress: Not at all  Social Connections: Socially Integrated (04/26/2024)   Social Connection and Isolation Panel    Frequency of Communication with Friends and Family: More than three times a week    Frequency of Social Gatherings with Friends and Family: More than three times a week    Attends Religious Services: More than 4 times per year    Active Member of [employer] or Organizations: Yes    Attends Engineer, Structural: More than 4 times per year    Marital Status: Married  Catering Manager Violence: Not At Risk (05/07/2021)   Humiliation, Afraid, Rape, and Kick questionnaire    Fear of Current or Ex-Partner: No    Emotionally Abused: No    Physically Abused: No    Sexually Abused: No     PHYSICAL EXAM  There were no vitals filed for this visit.   There is no height or weight on file to calculate BMI.  Generalized: Well developed, in no acute distress  Cardiology: normal rate and rhythm, no murmur noted Respiratory: clear to auscultation bilaterally  Neurological examination  Mentation: Alert oriented to time, place, history taking. Follows all commands speech and language fluent Cranial nerve II-XII: Pupils were equal round reactive to light. Extraocular movements were full, visual field were full  Motor: The motor testing reveals 5 over 5 strength of all 4 extremities. Good symmetric motor tone is noted throughout.  Gait and station: Gait is normal.    DIAGNOSTIC DATA (LABS, IMAGING, TESTING) - I reviewed patient records, labs, notes, testing and imaging myself where available.      No data to display           Lab Results  Component Value Date   WBC 4.6 05/03/2024   HGB 14.5 05/03/2024   HCT 43.6 05/03/2024  MCV 91.4 05/03/2024   PLT 142.0 (L) 05/03/2024      Component Value Date/Time   NA 139 04/20/2024 0000   K 5.1 04/20/2024 0000   CL 102 04/20/2024 0000   CO2 25 (A) 04/20/2024 0000   GLUCOSE 171 (H) 05/04/2022 0923   BUN 29 (A)  04/20/2024 0000   CREATININE 1.6 (A) 04/20/2024 0000   CREATININE 1.80 (H) 05/04/2022 0923   CREATININE 1.78 (H) 06/19/2020 0842   CALCIUM 10.1 04/20/2024 0000   PROT 6.7 05/04/2022 0923   ALBUMIN 4.3 04/20/2024 0000   AST 25 05/04/2022 0923   AST 24 06/19/2020 0842   ALT 28 05/04/2022 0923   ALT 30 06/19/2020 0842   ALKPHOS 3.3 (A) 04/21/2023 0000   BILITOT 0.6 05/04/2022 0923   BILITOT 0.5 06/19/2020 0842   GFRNONAA 28 (L) 06/16/2021 1029   GFRNONAA 28 (L) 06/19/2020 0842   GFRAA 34 (L) 06/26/2020 1535   GFRAA 33 (L) 06/19/2020 0842   Lab Results  Component Value Date   CHOL 146 05/03/2024   HDL 54.30 05/03/2024   LDLCALC 63 05/03/2024   LDLDIRECT 86.0 04/30/2021   TRIG 143.0 05/03/2024   CHOLHDL 3 05/03/2024   Lab Results  Component Value Date   HGBA1C 7.2 04/10/2024   No results found for: VITAMINB12 Lab Results  Component Value Date   TSH 1.36 05/03/2024     ASSESSMENT AND PLAN 73 y.o. year old female  has a past medical history of Bladder cancer (HCC), Breast cancer (HCC) (2021), DJD (degenerative joint disease), History of cerebral aneurysm repair, History of renal pelvis cancer, Hyperlipidemia, Hypertension, Liver hemangioma, OA (osteoarthritis), Personal history of chemotherapy, Personal history of radiation therapy, PONV (postoperative nausea and vomiting), Type 2 diabetes mellitus (HCC), UTI (urinary tract infection), and Wears glasses (03/19/2021). here with   No diagnosis found.   LONA SIX is doing well on CPAP therapy. Compliance report reveals excellent daily but sub optimal four hour compliance. AHI originally 46/h, now 26/h. She denies any significant improvement in sleep quality. ESS now 2/24, previously 10/24. We have discussed ways to help adjust therapy. She is willing to continue working on compliance. I will reach out to [employer] to inquire about possibility of getting her BiPAP. She is willing to switch machines if she does not have to repeat  sleep study. She was encouraged to continue using CPAP nightly and for greater than 4 hours each night. We will update supply orders as indicated. Risks of untreated sleep apnea review and education materials provided. Healthy lifestyle habits encouraged. She will follow up in 1 year if she remains on CPAP, sooner if needed. She verbalizes understanding and agreement with this plan.    No orders of the defined types were placed in this encounter.    No orders of the defined types were placed in this encounter.    I spent 30 minutes of face-to-face and non-face-to-face time with patient.  This included previsit chart review, lab review, study review, order entry, electronic health record documentation, patient education.   Greig Forbes, FNP-C 07/31/2024, 11:26 AM [facility] [address], [address] [place], [place] [address] [phone]

## 2024-08-01 NOTE — Progress Notes (Unsigned)
 SABRA

## 2024-08-02 ENCOUNTER — Ambulatory Visit (INDEPENDENT_AMBULATORY_CARE_PROVIDER_SITE_OTHER): Admitting: Family Medicine

## 2024-08-02 ENCOUNTER — Encounter: Payer: Self-pay | Admitting: Family Medicine

## 2024-08-02 VITALS — BP 142/56 | HR 67 | Ht 68.0 in | Wt 234.0 lb

## 2024-08-02 DIAGNOSIS — Z789 Other specified health status: Secondary | ICD-10-CM | POA: Diagnosis not present

## 2024-08-02 DIAGNOSIS — R0981 Nasal congestion: Secondary | ICD-10-CM | POA: Diagnosis not present

## 2024-08-02 DIAGNOSIS — G4739 Other sleep apnea: Secondary | ICD-10-CM

## 2024-08-02 DIAGNOSIS — G4730 Sleep apnea, unspecified: Secondary | ICD-10-CM | POA: Diagnosis not present

## 2024-08-03 ENCOUNTER — Encounter: Payer: Self-pay | Admitting: Family Medicine

## 2024-09-19 ENCOUNTER — Telehealth: Payer: Self-pay | Admitting: Family Medicine

## 2024-09-19 NOTE — Telephone Encounter (Signed)
 Can I get a 30 day compliance report to review AHI after pressure change? TY!

## 2024-09-19 NOTE — Telephone Encounter (Addendum)
 Tasha Tucker

## 2024-10-05 NOTE — Telephone Encounter (Signed)
 Called [facility] and asked them about pt's orders for pressure setting change.  Rep stated they didn't have order  Giving to Medical Records to fax

## 2024-11-08 ENCOUNTER — Ambulatory Visit: Admitting: Family Medicine

## 2025-05-07 ENCOUNTER — Encounter

## 2025-06-07 ENCOUNTER — Ambulatory Visit: Admitting: Family Medicine

## 2025-06-22 ENCOUNTER — Ambulatory Visit: Admitting: Hematology and Oncology

## 2025-06-26 ENCOUNTER — Ambulatory Visit: Admitting: Family Medicine
